# Patient Record
Sex: Female | Born: 1980 | Race: Black or African American | Hispanic: No | Marital: Single | Smoking: Never smoker
Health system: Southern US, Community
[De-identification: ages and names within clinical notes are randomized; demographics above are authoritative.]

## PROBLEM LIST (undated history)

## (undated) ENCOUNTER — Ambulatory Visit (HOSPITAL_COMMUNITY)
Disposition: A | Discharge status: Home / Self Care | Payer: Medicaid Other | Source: Ambulatory Visit | Attending: Psychiatry | Admitting: Psychiatry

## (undated) DIAGNOSIS — E669 Obesity, unspecified: Secondary | ICD-10-CM

## (undated) DIAGNOSIS — I10 Essential (primary) hypertension: Secondary | ICD-10-CM

## (undated) DIAGNOSIS — E0590 Thyrotoxicosis, unspecified without thyrotoxic crisis or storm: Secondary | ICD-10-CM

## (undated) DIAGNOSIS — T50901A Poisoning by unspecified drugs, medicaments and biological substances, accidental (unintentional), initial encounter: Secondary | ICD-10-CM

## (undated) DIAGNOSIS — E119 Type 2 diabetes mellitus without complications: Secondary | ICD-10-CM

## (undated) DIAGNOSIS — E079 Disorder of thyroid, unspecified: Secondary | ICD-10-CM

## (undated) DIAGNOSIS — F5089 Other specified eating disorder: Secondary | ICD-10-CM

## (undated) DIAGNOSIS — E872 Acidosis, unspecified: Secondary | ICD-10-CM

## (undated) DIAGNOSIS — F5083 Pica in adults: Secondary | ICD-10-CM

## (undated) DIAGNOSIS — F79 Unspecified intellectual disabilities: Secondary | ICD-10-CM

## (undated) DIAGNOSIS — I890 Lymphedema, not elsewhere classified: Secondary | ICD-10-CM

## (undated) DIAGNOSIS — F4320 Adjustment disorder, unspecified: Secondary | ICD-10-CM

## (undated) DIAGNOSIS — F603 Borderline personality disorder: Secondary | ICD-10-CM

## (undated) HISTORY — PX: NO PAST SURGERIES: SHX2092

---

## 2001-03-17 ENCOUNTER — Inpatient Hospital Stay (HOSPITAL_COMMUNITY): Admission: EM | Admit: 2001-03-17 | Discharge: 2001-03-23 | Payer: Self-pay | Admitting: *Deleted

## 2009-08-05 ENCOUNTER — Inpatient Hospital Stay (HOSPITAL_COMMUNITY): Admission: EM | Admit: 2009-08-05 | Discharge: 2009-08-08 | Payer: Self-pay | Admitting: Emergency Medicine

## 2009-08-10 ENCOUNTER — Inpatient Hospital Stay (HOSPITAL_COMMUNITY): Admission: EM | Admit: 2009-08-10 | Discharge: 2009-08-12 | Payer: Self-pay | Admitting: Emergency Medicine

## 2009-08-11 ENCOUNTER — Ambulatory Visit: Payer: Self-pay | Admitting: Psychiatry

## 2010-10-12 LAB — CARDIAC PANEL(CRET KIN+CKTOT+MB+TROPI)
Relative Index: 0.4 (ref 0.0–2.5)
Troponin I: 0.01 ng/mL (ref 0.00–0.06)

## 2010-10-12 LAB — CBC
HCT: 37.7 % (ref 36.0–46.0)
HCT: 40.4 % (ref 36.0–46.0)
Hemoglobin: 11.3 g/dL — ABNORMAL LOW (ref 12.0–15.0)
Hemoglobin: 13.5 g/dL (ref 12.0–15.0)
Hemoglobin: 13.2 g/dL (ref 12.0–15.0)
MCHC: 33.6 g/dL (ref 30.0–36.0)
MCHC: 33.7 g/dL (ref 30.0–36.0)
MCV: 87.5 fL (ref 78.0–100.0)
MCV: 88.2 fL (ref 78.0–100.0)
MCV: 87.3 fL (ref 78.0–100.0)
Platelets: 291 10*3/uL (ref 150–400)
Platelets: 319 10*3/uL (ref 150–400)
RBC: 3.8 MIL/uL — ABNORMAL LOW (ref 3.87–5.11)
RBC: 4.31 MIL/uL (ref 3.87–5.11)
RBC: 4.67 MIL/uL (ref 3.87–5.11)
RDW: 13.6 % (ref 11.5–15.5)
RDW: 13.2 % (ref 11.5–15.5)
WBC: 5 10*3/uL (ref 4.0–10.5)

## 2010-10-12 LAB — BASIC METABOLIC PANEL
BUN: 4 mg/dL — ABNORMAL LOW (ref 6–23)
BUN: 11 mg/dL (ref 6–23)
CO2: 21 mEq/L (ref 19–32)
CO2: 23 mEq/L (ref 19–32)
Chloride: 105 mEq/L (ref 96–112)
Chloride: 104 mEq/L (ref 96–112)
GFR calc non Af Amer: >60 mL/min (ref >60)
Glucose, Bld: 107 mg/dL — ABNORMAL HIGH (ref 70–99)
Glucose, Bld: 114 mg/dL — ABNORMAL HIGH (ref 70–99)
Potassium: 4 mEq/L (ref 3.5–5.1)
Potassium: 4 mEq/L (ref 3.5–5.1)
Potassium: 4.8 mEq/L (ref 3.5–5.1)
Sodium: 140 mEq/L (ref 135–145)

## 2010-10-12 LAB — URINALYSIS, ROUTINE W REFLEX MICROSCOPIC
Glucose, UA: NEGATIVE mg/dL
Hgb urine dipstick: NEGATIVE
Ketones, ur: NEGATIVE mg/dL
pH: 5.5 (ref 5.0–8.0)

## 2010-10-12 LAB — HEMOGLOBIN A1C
Hgb A1c MFr Bld: 5.8 % (ref 4.6–6.1)
Hgb A1c MFr Bld: 5.8 % (ref 4.6–6.1)
Mean Plasma Glucose: 120 mg/dL

## 2010-10-12 LAB — COMPREHENSIVE METABOLIC PANEL
ALT: 40 U/L — ABNORMAL HIGH (ref 0–35)
AST: 46 U/L — ABNORMAL HIGH (ref 0–37)
Albumin: 3.9 g/dL (ref 3.5–5.2)
Alkaline Phosphatase: 59 U/L (ref 39–117)
Glucose, Bld: 138 mg/dL — ABNORMAL HIGH (ref 70–99)
Potassium: 4.3 mEq/L (ref 3.5–5.1)
Sodium: 136 mEq/L (ref 135–145)
Total Protein: 7.8 g/dL (ref 6.0–8.3)

## 2010-10-12 LAB — GLUCOSE, CAPILLARY
Glucose-Capillary: 133 mg/dL — ABNORMAL HIGH (ref 70–99)
Glucose-Capillary: 66 mg/dL — ABNORMAL LOW (ref 70–99)
Glucose-Capillary: 88 mg/dL (ref 70–99)
Glucose-Capillary: 91 mg/dL (ref 70–99)
Glucose-Capillary: 93 mg/dL (ref 70–99)
Glucose-Capillary: 96 mg/dL (ref 70–99)
Glucose-Capillary: 98 mg/dL (ref 70–99)
Glucose-Capillary: 100 mg/dL — ABNORMAL HIGH (ref 70–99)
Glucose-Capillary: 102 mg/dL — ABNORMAL HIGH (ref 70–99)
Glucose-Capillary: 115 mg/dL — ABNORMAL HIGH (ref 70–99)

## 2010-10-12 LAB — RAPID URINE DRUG SCREEN, HOSP PERFORMED
Barbiturates: NOT DETECTED
Opiates: NOT DETECTED

## 2010-10-12 LAB — CK TOTAL AND CKMB (NOT AT ARMC)
CK, MB: 1.2 ng/mL (ref 0.3–4.0)
Relative Index: 0.6 (ref 0.0–2.5)
Total CK: 204 U/L — ABNORMAL HIGH (ref 7–177)

## 2010-10-12 LAB — DIFFERENTIAL
Basophils Absolute: 0 10*3/uL (ref 0.0–0.1)
Basophils Absolute: 0 10*3/uL (ref 0.0–0.1)
Basophils Relative: 0 % (ref 0–1)
Eosinophils Absolute: 0.1 10*3/uL (ref 0.0–0.7)
Eosinophils Absolute: 0.1 10*3/uL (ref 0.0–0.7)
Eosinophils Relative: 1 % (ref 0–5)
Eosinophils Relative: 1 % (ref 0–5)
Metamyelocytes Relative: 0 %
Monocytes Absolute: 0.4 10*3/uL (ref 0.1–1.0)
Myelocytes: 0 %

## 2010-10-12 LAB — ETHANOL: Alcohol, Ethyl (B): <5 mg/dL (ref 0–10)

## 2010-10-12 LAB — POCT CARDIAC MARKERS: CKMB, poc: 1.4 ng/mL (ref 1.0–8.0)

## 2010-10-13 LAB — CBC
Hemoglobin: 11.1 g/dL — ABNORMAL LOW (ref 12.0–15.0)
MCHC: 32.5 g/dL (ref 30.0–36.0)
MCHC: 33 g/dL (ref 30.0–36.0)
MCV: 86.9 fL (ref 78.0–100.0)
Platelets: 295 10*3/uL (ref 150–400)
RBC: 3.91 MIL/uL (ref 3.87–5.11)
RBC: 4.33 MIL/uL (ref 3.87–5.11)

## 2010-10-13 LAB — GLUCOSE, CAPILLARY
Glucose-Capillary: 110 mg/dL — ABNORMAL HIGH (ref 70–99)
Glucose-Capillary: 113 mg/dL — ABNORMAL HIGH (ref 70–99)
Glucose-Capillary: 76 mg/dL (ref 70–99)
Glucose-Capillary: 96 mg/dL (ref 70–99)

## 2010-10-13 LAB — BASIC METABOLIC PANEL
BUN: 10 mg/dL (ref 6–23)
CO2: 21 mEq/L (ref 19–32)
CO2: 25 mEq/L (ref 19–32)
Calcium: 8.6 mg/dL (ref 8.4–10.5)
Calcium: 8.9 mg/dL (ref 8.4–10.5)
Chloride: 108 mEq/L (ref 96–112)
Chloride: 109 mEq/L (ref 96–112)
Creatinine, Ser: 0.73 mg/dL (ref 0.4–1.2)
GFR calc Af Amer: >60 mL/min (ref >60)
GFR calc Af Amer: >60 mL/min (ref >60)
Potassium: 4 mEq/L (ref 3.5–5.1)
Sodium: 137 mEq/L (ref 135–145)

## 2010-10-13 LAB — LIPID PANEL
Cholesterol: 169 mg/dL (ref 0–200)
HDL: 62 mg/dL (ref >39)

## 2010-10-13 LAB — TSH: TSH: 1.648 u[IU]/mL (ref 0.350–4.500)

## 2010-10-13 LAB — URINALYSIS, ROUTINE W REFLEX MICROSCOPIC
Bilirubin Urine: NEGATIVE
Glucose, UA: NEGATIVE mg/dL
Hgb urine dipstick: NEGATIVE
Ketones, ur: NEGATIVE mg/dL
Protein, ur: NEGATIVE mg/dL
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
## 2010-12-12 NOTE — H&P (Signed)
[facility]  Patient:    Megan Manning, Megan Manning Visit Number: [id number] MRN: [id number]          Service Type: PSY Location: 30 0300 01 Attending Physician:  Denny Peon Dictated by:   Young Berry Scott, R.N. N.P. Admit Date:  03/17/2001                           History and Physical  HISTORY OF PRESENT ILLNESS:  This is an obese, black female, who uses the [facility] for her health care.  She primarily goes there for OB/GYN reasons and gets her Depo-Provera shot there quarterly.  The patient has no somatic complaints.  REVIEW OF SYSTEMS:  Review of systems is essentially negative.  She is a poor historian due to her mental retardation and we have a limited history available to her.  She is obviously breathing through her mouth and her nasal passages are completely congested.  She denies any feelings of ear pressure. She denies any cough or wheeze.  She states that her nose has been stuffed up "for a long time."  On admission, her WBC was 5.6 and she is afebrile.  The patient reports that she has had a Pap smear within the past year.  She de n any history of urinary tract infections.  She denies that she is having sexual intercourse, but is active sexually.  However, her case worker reports that she is promiscuous.  She denies any symptoms of vaginal itching or vaginal discharge, and she generally is healthy in appearance.  PHYSICAL EXAMINATION:  GENERAL APPEARANCE:  This is a obese, young, black female, who appears to be her stated age of 20 years.  Her hygiene is poor.  She is wearing dirty clothing and there is some body odor present.  She is alert and cooperative with the examination.  VITAL SIGNS:  Vital signs today were already reported.  The patient weighs 248 pounds and she is 5 feet 6 inches tall.  Vital signs are all within normal limits.  SKIN:  Dark in tone with roughness of the follicles on her legs and  thighs. No distinguishing marks.  Hair distribution is normal for age and sex.  HEENT:  Head is normocephalic without lesions.  No tenderness over frontal or maxillary sinuses.  ENT:  PERRLA.  No vessel changes seen on fundi bilaterally.  External ear canals are partially occluded by soft cerumen. Right reflex appears normal but is barely visible.  Hearing is intact to whisper to voice.  Nasal examination with otoscope reveals markedly swollen turbinates bilaterally.  Septum is midline.  There is a clear to whitish rhinorrhea on both and neither canal is patent.  No obvious polyps visualized. Nasal mucosa is also slightly injected.  Oropharynx is not injected.  Teeth are in good condition.  No breath odor.  Tongue is midline with facilitations.  NECK:  Supple with no thyromegaly.  AC and PC nodes are negative.  CHEST:  Symmetrical.  BREASTS:  Examination deferred.  CARDIOVASCULAR:  S1 and S2 is heard.  Regular rate and rhythm.  No clicks, murmurs, or gallops.  LUNGS:  Clear to auscultation.  ABDOMEN:  Rounded and with active bowel sounds.  Striae are apparently on the skin.  No masses or tenderness.  NEUROLOGICAL:  Cranial nerves II-XII are intact.  Extraocular movements are intact without nystagmus.  Deep tendon reflexes are 1+ and sluggish out of 5 but are bilaterally  symmetrical.  Romberg without findings.  Babinski is down. The patient has no tremor noted.  Motor movements are smooth.  No extensive tremor.  Sensory is grossly intact.  Musculoskeletal spine is straight, Posture is upright, gait is normal given her obese weight. Dictated by:   Young Berry Scott, R.N. N.P. Attending Physician:  Denny Peon DD:  03/18/01 TD:  03/22/01 Job: [id number]

## 2012-10-30 ENCOUNTER — Emergency Department: Payer: Self-pay | Admitting: Emergency Medicine

## 2016-02-10 DIAGNOSIS — R933 Abnormal findings on diagnostic imaging of other parts of digestive tract: Secondary | ICD-10-CM | POA: Insufficient documentation

## 2016-02-10 DIAGNOSIS — Z87821 Personal history of retained foreign body fully removed: Secondary | ICD-10-CM | POA: Insufficient documentation

## 2016-02-10 DIAGNOSIS — R1310 Dysphagia, unspecified: Secondary | ICD-10-CM | POA: Insufficient documentation

## 2016-02-10 DIAGNOSIS — E119 Type 2 diabetes mellitus without complications: Secondary | ICD-10-CM | POA: Insufficient documentation

## 2016-02-11 DIAGNOSIS — F79 Unspecified intellectual disabilities: Secondary | ICD-10-CM | POA: Insufficient documentation

## 2016-02-11 DIAGNOSIS — Z62810 Personal history of physical and sexual abuse in childhood: Secondary | ICD-10-CM | POA: Insufficient documentation

## 2016-02-11 DIAGNOSIS — F603 Borderline personality disorder: Secondary | ICD-10-CM | POA: Insufficient documentation

## 2016-07-24 ENCOUNTER — Emergency Department
Admission: EM | Admit: 2016-07-24 | Discharge: 2016-07-24 | Disposition: A | Discharge status: Home / Self Care | Payer: Medicaid Other | Attending: Emergency Medicine | Admitting: Emergency Medicine

## 2016-07-24 ENCOUNTER — Emergency Department: Payer: Medicaid Other

## 2016-07-24 DIAGNOSIS — R1084 Generalized abdominal pain: Secondary | ICD-10-CM

## 2016-07-24 DIAGNOSIS — R112 Nausea with vomiting, unspecified: Secondary | ICD-10-CM

## 2016-07-24 DIAGNOSIS — E119 Type 2 diabetes mellitus without complications: Secondary | ICD-10-CM | POA: Insufficient documentation

## 2016-07-24 DIAGNOSIS — K5900 Constipation, unspecified: Secondary | ICD-10-CM | POA: Insufficient documentation

## 2016-07-24 DIAGNOSIS — I10 Essential (primary) hypertension: Secondary | ICD-10-CM | POA: Diagnosis not present

## 2016-07-24 HISTORY — DX: Disorder of thyroid, unspecified: E07.9

## 2016-07-24 HISTORY — DX: Essential (primary) hypertension: I10

## 2016-07-24 HISTORY — DX: Type 2 diabetes mellitus without complications: E11.9

## 2016-07-24 LAB — URINALYSIS, COMPLETE (UACMP) WITH MICROSCOPIC
BACTERIA UA: NONE SEEN
BILIRUBIN URINE: NEGATIVE
Glucose, UA: NEGATIVE mg/dL
HGB URINE DIPSTICK: NEGATIVE
Ketones, ur: NEGATIVE mg/dL
Leukocytes, UA: NEGATIVE
NITRITE: NEGATIVE
PROTEIN: NEGATIVE mg/dL
SPECIFIC GRAVITY, URINE: 1.023 (ref 1.005–1.030)
pH: 5 (ref 5.0–8.0)

## 2016-07-24 LAB — COMPREHENSIVE METABOLIC PANEL
ALT: 20 U/L (ref 14–54)
AST: 20 U/L (ref 15–41)
Albumin: 4.6 g/dL (ref 3.5–5.0)
Alkaline Phosphatase: 66 U/L (ref 38–126)
Anion gap: 8 (ref 5–15)
BUN: 19 mg/dL (ref 6–20)
CO2: 23 mmol/L (ref 22–32)
Calcium: 9.7 mg/dL (ref 8.9–10.3)
Chloride: 108 mmol/L (ref 101–111)
Creatinine, Ser: 0.67 mg/dL (ref 0.44–1.00)
GFR calc Af Amer: >60 mL/min (ref >60)
GFR calc non Af Amer: >60 mL/min (ref >60)
Glucose, Bld: 93 mg/dL (ref 65–99)
Potassium: 4.4 mmol/L (ref 3.5–5.1)
Sodium: 139 mmol/L (ref 135–145)
Total Bilirubin: 0.3 mg/dL (ref 0.3–1.2)
Total Protein: 8 g/dL (ref 6.5–8.1)

## 2016-07-24 LAB — CBC
HCT: 39 % (ref 35.0–47.0)
Hemoglobin: 13.2 g/dL (ref 12.0–16.0)
MCH: 28.9 pg (ref 26.0–34.0)
MCHC: 33.7 g/dL (ref 32.0–36.0)
MCV: 85.8 fL (ref 80.0–100.0)
Platelets: 273 10*3/uL (ref 150–440)
RBC: 4.55 MIL/uL (ref 3.80–5.20)
RDW: 13.5 % (ref 11.5–14.5)
WBC: 3.8 10*3/uL (ref 3.6–11.0)

## 2016-07-24 LAB — LIPASE, BLOOD: LIPASE: 39 U/L (ref 11–51)

## 2016-07-24 LAB — POCT PREGNANCY, URINE: Preg Test, Ur: NEGATIVE

## 2016-07-24 MED ORDER — POLYETHYLENE GLYCOL 3350 17 G PO PACK: 17.0000 g | PACK | Freq: Every day | ORAL | 0 refills | Status: DC

## 2016-07-24 MED ORDER — ONDANSETRON 4 MG PO TBDP
4.0000 mg | ORAL_TABLET | Freq: Once | ORAL | Status: AC
  Administered 2016-07-24: 4 mg via ORAL
  Filled 2016-07-24: qty 1

## 2016-07-24 MED ORDER — ONDANSETRON 4 MG PO TBDP: 4.0000 mg | ORAL_TABLET | Freq: Three times a day (TID) | ORAL | 0 refills | Status: DC | PRN

## 2016-07-24 NOTE — ED Triage Notes (Signed)
Pt reports generalized abdominal pain since last night. Reports vomited x2 last night. Reports diarrhea last Friday but has not had a BM since. VS stable.

## 2016-07-24 NOTE — ED Provider Notes (Signed)
[facility] Emergency Department Provider Note        Time seen: ----------------------------------------- 3:41 PM on 07/24/2016 -----------------------------------------    I have reviewed the triage vital signs and the nursing notes.   HISTORY  Chief Complaint Abdominal Pain    HPI Megan Manning is a 35 y.o. female who presents to ER for generalized abdominal pain since last night. Patient reports she vomited twice last night, reports diarrhea 6 days ago but she has not had a bowel movement since.Patient reports she typically uses a rectal suppository to have a bowel movement. She denies fevers, chills, chest pain, shortness of breath or other complaints. Patient thinks she may be constipated at this time.   Past Medical History:  Diagnosis Date  . Diabetes mellitus without complication (HCC)   . Hypertension   . Thyroid disease     There are no active problems to display for this patient.   History reviewed. No pertinent surgical history.  Allergies Patient has no known allergies.  Social History Social History  Substance Use Topics  . Smoking status: Never Smoker  . Smokeless tobacco: Never Used  . Alcohol use No    Review of Systems Constitutional: Negative for fever. Cardiovascular: Negative for chest pain. Respiratory: Negative for shortness of breath. Gastrointestinal: Positive for abdominal pain, vomiting Genitourinary: Negative for dysuria. Musculoskeletal: Negative for back pain. Skin: Negative for rash. Neurological: Negative for headaches, focal weakness or numbness.  10-point ROS otherwise negative.  ____________________________________________   PHYSICAL EXAM:  VITAL SIGNS: ED Triage Vitals [07/24/16 1232]  Enc Vitals Group     BP 131/78     Pulse Rate 81     Resp 16     Temp 99.1 F (37.3 C)     Temp Source Oral     SpO2 96 %     Weight 218 lb (98.9 kg)     Height 5\' 3"  (1.6 m)     Head Circumference       Peak Flow      Pain Score      Pain Loc      Pain Edu?      Excl. in GC?     Constitutional: Alert and oriented. Well appearing and in no distress. Eyes: Conjunctivae are normal. PERRL. Normal extraocular movements. ENT   Head: Normocephalic and atraumatic.   Nose: No congestion/rhinnorhea.   Mouth/Throat: Mucous membranes are moist.   Neck: No stridor. Cardiovascular: Normal rate, regular rhythm. No murmurs, rubs, or gallops. Respiratory: Normal respiratory effort without tachypnea nor retractions. Breath sounds are clear and equal bilaterally. No wheezes/rales/rhonchi. Gastrointestinal: Soft and nontender. Normal bowel sounds Musculoskeletal: Nontender with normal range of motion in all extremities. No lower extremity tenderness nor edema. Neurologic:  Normal speech and language. No gross focal neurologic deficits are appreciated.  Skin:  Skin is warm, dry and intact. No rash noted. Psychiatric: Mood and affect are normal. Speech and behavior are normal.  ____________________________________________  ED COURSE:  Pertinent labs & imaging results that were available during my care of the patient were reviewed by me and considered in my medical decision making (see chart for details). Clinical Course   Patient is no distress, we will assess with labs and imaging  Procedures ____________________________________________   LABS (pertinent positives/negatives)  Labs Reviewed  URINALYSIS, COMPLETE (UACMP) WITH MICROSCOPIC - Abnormal; Notable for the following:       Result Value   Color, Urine YELLOW (*)    APPearance CLEAR (*)  Squamous Epithelial / LPF 0-5 (*)    All other components within normal limits  LIPASE, BLOOD  COMPREHENSIVE METABOLIC PANEL  CBC  POC URINE PREG, ED  POCT PREGNANCY, URINE    RADIOLOGY Images were viewed by me  Abdomen 2 view Did not reveal bowel obstruction or ileus ____________________________________________  FINAL  ASSESSMENT AND PLAN  Abdominal pain, vomiting  Plan: Patient with labs and imaging as dictated above. Patient is in no acute distress, likely viral in etiology. She does have some chronic constipation. She'll be discharged with Zofran and MiraLAX. She stable for outpatient follow-up.   Emily FilbertWilliams, Zoe Goonan E, MD   Note: This dictation was prepared with Dragon dictation. Any transcriptional errors that result from this process are unintentional    Emily FilbertJonathan E Dakarri Kessinger, MD 07/24/16 [phone]

## 2016-09-28 ENCOUNTER — Emergency Department: Payer: Medicaid Other

## 2016-09-28 ENCOUNTER — Observation Stay
Admission: EM | Admit: 2016-09-28 | Discharge: 2016-09-30 | Disposition: A | Discharge status: Home / Self Care | Payer: Medicaid Other | Attending: Internal Medicine | Admitting: Internal Medicine

## 2016-09-28 ENCOUNTER — Encounter: Payer: Self-pay | Admitting: Emergency Medicine

## 2016-09-28 DIAGNOSIS — J9819 Other pulmonary collapse: Secondary | ICD-10-CM | POA: Diagnosis not present

## 2016-09-28 DIAGNOSIS — R600 Localized edema: Secondary | ICD-10-CM | POA: Diagnosis not present

## 2016-09-28 DIAGNOSIS — E039 Hypothyroidism, unspecified: Secondary | ICD-10-CM | POA: Insufficient documentation

## 2016-09-28 DIAGNOSIS — R531 Weakness: Secondary | ICD-10-CM | POA: Diagnosis not present

## 2016-09-28 DIAGNOSIS — F840 Autistic disorder: Secondary | ICD-10-CM | POA: Insufficient documentation

## 2016-09-28 DIAGNOSIS — F39 Unspecified mood [affective] disorder: Secondary | ICD-10-CM | POA: Diagnosis not present

## 2016-09-28 DIAGNOSIS — R0781 Pleurodynia: Secondary | ICD-10-CM

## 2016-09-28 DIAGNOSIS — G459 Transient cerebral ischemic attack, unspecified: Secondary | ICD-10-CM | POA: Diagnosis present

## 2016-09-28 DIAGNOSIS — Z79899 Other long term (current) drug therapy: Secondary | ICD-10-CM | POA: Diagnosis not present

## 2016-09-28 DIAGNOSIS — J90 Pleural effusion, not elsewhere classified: Secondary | ICD-10-CM | POA: Diagnosis not present

## 2016-09-28 DIAGNOSIS — I351 Nonrheumatic aortic (valve) insufficiency: Secondary | ICD-10-CM | POA: Diagnosis not present

## 2016-09-28 DIAGNOSIS — R071 Chest pain on breathing: Secondary | ICD-10-CM | POA: Insufficient documentation

## 2016-09-28 DIAGNOSIS — I119 Hypertensive heart disease without heart failure: Secondary | ICD-10-CM | POA: Diagnosis not present

## 2016-09-28 DIAGNOSIS — R29810 Facial weakness: Secondary | ICD-10-CM

## 2016-09-28 DIAGNOSIS — J9811 Atelectasis: Secondary | ICD-10-CM | POA: Diagnosis present

## 2016-09-28 DIAGNOSIS — E119 Type 2 diabetes mellitus without complications: Secondary | ICD-10-CM | POA: Diagnosis not present

## 2016-09-28 DIAGNOSIS — Z6838 Body mass index (BMI) 38.0-38.9, adult: Secondary | ICD-10-CM | POA: Insufficient documentation

## 2016-09-28 LAB — BASIC METABOLIC PANEL
Anion gap: 5 (ref 5–15)
BUN: 12 mg/dL (ref 6–20)
CALCIUM: 8.5 mg/dL — AB (ref 8.9–10.3)
CO2: 24 mmol/L (ref 22–32)
CREATININE: 0.61 mg/dL (ref 0.44–1.00)
Chloride: 114 mmol/L — ABNORMAL HIGH (ref 101–111)
GFR calc Af Amer: >60 mL/min (ref >60)
GLUCOSE: 108 mg/dL — AB (ref 65–99)
Potassium: 3.6 mmol/L (ref 3.5–5.1)
Sodium: 143 mmol/L (ref 135–145)

## 2016-09-28 LAB — CBC WITH DIFFERENTIAL/PLATELET
Basophils Absolute: 0 10*3/uL (ref 0–0.1)
Basophils Relative: 1 %
Eosinophils Absolute: 0 10*3/uL (ref 0–0.7)
Eosinophils Relative: 1 %
HCT: 33.2 % — ABNORMAL LOW (ref 35.0–47.0)
Hemoglobin: 11.1 g/dL — ABNORMAL LOW (ref 12.0–16.0)
Lymphocytes Relative: 45 %
Lymphs Abs: 2.4 10*3/uL (ref 1.0–3.6)
MCH: 28.1 pg (ref 26.0–34.0)
MCHC: 33.4 g/dL (ref 32.0–36.0)
MCV: 84.2 fL (ref 80.0–100.0)
Monocytes Absolute: 0.3 10*3/uL (ref 0.2–0.9)
Monocytes Relative: 7 %
Neutro Abs: 2.5 10*3/uL (ref 1.4–6.5)
Neutrophils Relative %: 46 %
Platelets: 240 10*3/uL (ref 150–440)
RBC: 3.94 MIL/uL (ref 3.80–5.20)
RDW: 14.2 % (ref 11.5–14.5)
WBC: 5.3 10*3/uL (ref 3.6–11.0)

## 2016-09-28 LAB — CBC
HEMATOCRIT: 34.6 % — AB (ref 35.0–47.0)
Hemoglobin: 11.6 g/dL — ABNORMAL LOW (ref 12.0–16.0)
MCH: 28.4 pg (ref 26.0–34.0)
MCHC: 33.4 g/dL (ref 32.0–36.0)
MCV: 85 fL (ref 80.0–100.0)
Platelets: 265 10*3/uL (ref 150–440)
RBC: 4.07 MIL/uL (ref 3.80–5.20)
RDW: 14.4 % (ref 11.5–14.5)
WBC: 4.7 10*3/uL (ref 3.6–11.0)

## 2016-09-28 LAB — GLUCOSE, CAPILLARY
GLUCOSE-CAPILLARY: 94 mg/dL (ref 65–99)
Glucose-Capillary: 117 mg/dL — ABNORMAL HIGH (ref 65–99)

## 2016-09-28 LAB — BRAIN NATRIURETIC PEPTIDE: B Natriuretic Peptide: 8 pg/mL (ref 0.0–100.0)

## 2016-09-28 LAB — TROPONIN I: Troponin I: <0.03 ng/mL (ref <0.03)

## 2016-09-28 LAB — HCG, QUANTITATIVE, PREGNANCY

## 2016-09-28 MED ORDER — ONDANSETRON HCL 4 MG/2ML IJ SOLN: 4.0000 mg | Freq: Four times a day (QID) | INTRAMUSCULAR | Status: DC | PRN

## 2016-09-28 MED ORDER — INSULIN ASPART 100 UNIT/ML ~~LOC~~ SOLN
0.0000 [IU] | Freq: Three times a day (TID) | SUBCUTANEOUS | Status: DC
  Administered 2016-09-30: 2 [IU] via SUBCUTANEOUS
  Filled 2016-09-28: qty 2

## 2016-09-28 MED ORDER — POLYETHYLENE GLYCOL 3350 17 G PO PACK: 17.0000 g | PACK | Freq: Every day | ORAL | Status: DC | PRN

## 2016-09-28 MED ORDER — METOPROLOL SUCCINATE ER 25 MG PO TB24
25.0000 mg | ORAL_TABLET | Freq: Every day | ORAL | Status: DC
  Administered 2016-09-29 – 2016-09-30 (×2): 25 mg via ORAL
  Filled 2016-09-28 (×3): qty 1

## 2016-09-28 MED ORDER — SODIUM CHLORIDE 0.9% FLUSH
3.0000 mL | Freq: Two times a day (BID) | INTRAVENOUS | Status: DC
  Administered 2016-09-28 – 2016-09-30 (×4): 3 mL via INTRAVENOUS

## 2016-09-28 MED ORDER — LISINOPRIL 10 MG PO TABS
10.0000 mg | ORAL_TABLET | Freq: Every day | ORAL | Status: DC
  Administered 2016-09-28 – 2016-09-30 (×3): 10 mg via ORAL
  Filled 2016-09-28 (×2): qty 1

## 2016-09-28 MED ORDER — FAMOTIDINE 20 MG PO TABS
20.0000 mg | ORAL_TABLET | Freq: Every day | ORAL | Status: DC
  Administered 2016-09-29 – 2016-09-30 (×2): 20 mg via ORAL
  Filled 2016-09-28 (×2): qty 1

## 2016-09-28 MED ORDER — CLOZAPINE 100 MG PO TABS
150.0000 mg | ORAL_TABLET | Freq: Every day | ORAL | Status: DC
  Administered 2016-09-29 – 2016-09-30 (×3): 150 mg via ORAL
  Filled 2016-09-28 (×3): qty 2

## 2016-09-28 MED ORDER — ALBUTEROL SULFATE (2.5 MG/3ML) 0.083% IN NEBU: 2.5000 mg | INHALATION_SOLUTION | RESPIRATORY_TRACT | Status: DC | PRN

## 2016-09-28 MED ORDER — BISACODYL 10 MG RE SUPP: 10.0000 mg | Freq: Every day | RECTAL | Status: DC | PRN

## 2016-09-28 MED ORDER — LISINOPRIL 10 MG PO TABS
ORAL_TABLET | ORAL | Status: AC
  Administered 2016-09-28: 10 mg via ORAL
  Filled 2016-09-28: qty 1

## 2016-09-28 MED ORDER — IBUPROFEN 200 MG PO TABS
400.0000 mg | ORAL_TABLET | Freq: Four times a day (QID) | ORAL | Status: DC | PRN
  Administered 2016-09-28 – 2016-09-29 (×2): 400 mg via ORAL
  Filled 2016-09-28 (×2): qty 2

## 2016-09-28 MED ORDER — KETOROLAC TROMETHAMINE 15 MG/ML IJ SOLN
15.0000 mg | Freq: Four times a day (QID) | INTRAMUSCULAR | Status: DC | PRN
  Administered 2016-09-28 – 2016-09-29 (×2): 15 mg via INTRAVENOUS
  Filled 2016-09-28 (×2): qty 1

## 2016-09-28 MED ORDER — ACETAMINOPHEN 325 MG PO TABS: 650.0000 mg | ORAL_TABLET | Freq: Four times a day (QID) | ORAL | Status: DC | PRN

## 2016-09-28 MED ORDER — IOPAMIDOL (ISOVUE-370) INJECTION 76%
75.0000 mL | Freq: Once | INTRAVENOUS | Status: AC | PRN
  Administered 2016-09-28: 75 mL via INTRAVENOUS

## 2016-09-28 MED ORDER — METFORMIN HCL 500 MG PO TABS
500.0000 mg | ORAL_TABLET | Freq: Every day | ORAL | Status: DC
  Administered 2016-09-29: 500 mg via ORAL
  Filled 2016-09-28: qty 1

## 2016-09-28 MED ORDER — HYDRALAZINE HCL 20 MG/ML IJ SOLN: 10.0000 mg | Freq: Four times a day (QID) | INTRAMUSCULAR | Status: DC | PRN

## 2016-09-28 MED ORDER — ASPIRIN 81 MG PO CHEW
324.0000 mg | CHEWABLE_TABLET | Freq: Once | ORAL | Status: AC
  Administered 2016-09-28: 324 mg via ORAL
  Filled 2016-09-28: qty 4

## 2016-09-28 MED ORDER — ONDANSETRON HCL 4 MG PO TABS: 4.0000 mg | ORAL_TABLET | Freq: Four times a day (QID) | ORAL | Status: DC | PRN

## 2016-09-28 MED ORDER — HALOPERIDOL 5 MG PO TABS
5.0000 mg | ORAL_TABLET | Freq: Every day | ORAL | Status: DC
  Administered 2016-09-29 – 2016-09-30 (×3): 5 mg via ORAL
  Filled 2016-09-28 (×3): qty 1

## 2016-09-28 MED ORDER — KETOROLAC TROMETHAMINE 30 MG/ML IJ SOLN
INTRAMUSCULAR | Status: AC
  Administered 2016-09-28: 15 mg
  Filled 2016-09-28: qty 1

## 2016-09-28 MED ORDER — POLYETHYLENE GLYCOL 3350 17 G PO PACK
17.0000 g | PACK | Freq: Every day | ORAL | Status: DC
  Administered 2016-09-29 – 2016-09-30 (×2): 17 g via ORAL
  Filled 2016-09-28 (×2): qty 1

## 2016-09-28 MED ORDER — ATORVASTATIN CALCIUM 20 MG PO TABS
40.0000 mg | ORAL_TABLET | Freq: Every day | ORAL | Status: DC
  Administered 2016-09-29: 40 mg via ORAL
  Filled 2016-09-28: qty 2

## 2016-09-28 MED ORDER — METOPROLOL SUCCINATE ER 50 MG PO TB24
ORAL_TABLET | ORAL | Status: AC
  Filled 2016-09-28: qty 1

## 2016-09-28 MED ORDER — ENOXAPARIN SODIUM 40 MG/0.4ML ~~LOC~~ SOLN
40.0000 mg | SUBCUTANEOUS | Status: DC
  Administered 2016-09-28 – 2016-09-29 (×2): 40 mg via SUBCUTANEOUS
  Filled 2016-09-28 (×2): qty 0.4

## 2016-09-28 MED ORDER — ACETAMINOPHEN 650 MG RE SUPP: 650.0000 mg | Freq: Four times a day (QID) | RECTAL | Status: DC | PRN

## 2016-09-28 MED ORDER — ASPIRIN EC 81 MG PO TBEC
81.0000 mg | DELAYED_RELEASE_TABLET | Freq: Every day | ORAL | Status: DC
  Administered 2016-09-29 – 2016-09-30 (×2): 81 mg via ORAL
  Filled 2016-09-28 (×2): qty 1

## 2016-09-28 MED ORDER — LEVOTHYROXINE SODIUM 88 MCG PO TABS
88.0000 ug | ORAL_TABLET | Freq: Every day | ORAL | Status: DC
  Administered 2016-09-29: 88 ug via ORAL
  Filled 2016-09-28 (×2): qty 1

## 2016-09-28 NOTE — ED Notes (Signed)
Patient transported to CT 

## 2016-09-28 NOTE — ED Provider Notes (Signed)
[facility] Emergency Department Provider Note   ____________________________________________   First MD Initiated Contact with Patient 09/28/16 1551     (approximate)  I have reviewed the triage vital signs and the nursing notes.   HISTORY  Chief Complaint Chest Pain and Weakness  EM caveat: Somewhat limited due to patient autism and cognition.  HPI Megan Manning is a 36 y.o. female here for medical evaluation. The exact reason for evaluation is slightly unclear.  The patient reports she is here for evaluation of chest pain. Evidently around 2 hours before arrival she began experiencing pain in her middle chest which she describes as sharp. She was in the car. Then the caretaker reports the patient was drooling, acting strangely and exhibited left arm and leg weakness and speech change, but the caregiver also reports that she is only care for her once before.   Past Medical History:  Diagnosis Date  . Diabetes mellitus without complication (HCC)   . Hypertension   . Thyroid disease     There are no active problems to display for this patient.   History reviewed. No pertinent surgical history.  Prior to Admission medications   Medication Sig Start Date End Date Taking? Authorizing Provider  clozapine (CLOZARIL) 50 MG tablet Take 150 mg by mouth daily.   Yes Historical Provider, MD  docusate sodium (COLACE) 100 MG capsule Take 100 mg by mouth 2 (two) times daily as needed for mild constipation.   Yes Historical Provider, MD  haloperidol (HALDOL) 5 MG tablet Take 5 mg by mouth daily.   Yes Historical Provider, MD  levothyroxine (SYNTHROID, LEVOTHROID) 88 MCG tablet Take 88 mcg by mouth daily.   Yes Historical Provider, MD  loratadine (CLARITIN) 10 MG tablet Take 10 mg by mouth daily.   Yes Historical Provider, MD  magnesium oxide (MAG-OX) 400 MG tablet Take 400 mg by mouth daily.   Yes Historical Provider, MD  metFORMIN (GLUCOPHAGE) 500 MG tablet  Take 500 mg by mouth daily.   Yes Historical Provider, MD  ondansetron (ZOFRAN ODT) 4 MG disintegrating tablet Take 1 tablet (4 mg total) by mouth every 8 (eight) hours as needed for nausea or vomiting. 07/24/16  Yes Emily Filbert, MD  polyethylene glycol (MIRALAX / GLYCOLAX) packet Take 17 g by mouth daily. 07/24/16  Yes Emily Filbert, MD  ranitidine (ZANTAC) 150 MG tablet Take 150 mg by mouth 2 (two) times daily.   Yes Historical Provider, MD    Allergies Patient has no known allergies.  History reviewed. No pertinent family history.  Social History Social History  Substance Use Topics  . Smoking status: Never Smoker  . Smokeless tobacco: Never Used  . Alcohol use No    Review of Systems Constitutional: No fever/chills Eyes: No visual changes. ENT: No sore throat. Cardiovascular: History of present illness Respiratory: Denies shortness of breath. Gastrointestinal: No abdominal pain.  No nausea, no vomiting.  No diarrhea.  No constipation. Genitourinary: Negative for dysuria. Musculoskeletal: Negative for back pain. Skin: Negative for rash. Neurological: Negative for headaches. See history of present illness  10-point ROS otherwise negative.  ____________________________________________   PHYSICAL EXAM:  VITAL SIGNS: ED Triage Vitals [09/28/16 1241]  Enc Vitals Group     BP (!) 162/93     Pulse Rate (!) 124     Resp 20     Temp 99.6 F (37.6 C)     Temp Source Oral     SpO2 98 %  Weight 219 lb (99.3 kg)     Height 5\' 3"  (1.6 m)     Head Circumference      Peak Flow      Pain Score 10     Pain Loc      Pain Edu?      Excl. in GC?     Constitutional: Alert and oriented. Well appearing and in no acute distress. Eyes: Conjunctivae are normal. PERRL. EOMI. Head: Atraumatic. Nose: No congestion/rhinnorhea. Mouth/Throat: Mucous membranes are moist.  Oropharynx non-erythematous. Neck: No stridor.   Cardiovascular: Mild tachycardic rate, regular  rhythm. Grossly normal heart sounds.  Good peripheral circulation. Respiratory: Normal respiratory effort.  No retractions. Lungs CTAB. Gastrointestinal: Soft and nontender. No distention. Musculoskeletal: No lower extremity tenderness but does have 2+ lower extremity edema.  No joint effusions. Neurologic:  Normal speech and language. No gross focal neurologic deficits are appreciated. Moves all extremities with normal strength now. She reports all first symptoms of weakness have resolved. No facial droop. Clear speech. Skin:  Skin is warm, dry and intact. No rash noted. Psychiatric: Mood and affect are normal. Speech and behavior are normal.  ____________________________________________   LABS (all labs ordered are listed, but only abnormal results are displayed)  Labs Reviewed  BASIC METABOLIC PANEL - Abnormal; Notable for the following:       Result Value   Chloride 114 (*)    Glucose, Bld 108 (*)    Calcium 8.5 (*)    All other components within normal limits  CBC - Abnormal; Notable for the following:    Hemoglobin 11.6 (*)    HCT 34.6 (*)    All other components within normal limits  GLUCOSE, CAPILLARY - Abnormal; Notable for the following:    Glucose-Capillary 117 (*)    All other components within normal limits  HCG, QUANTITATIVE, PREGNANCY  BRAIN NATRIURETIC PEPTIDE  URINALYSIS, COMPLETE (UACMP) WITH MICROSCOPIC  CBG MONITORING, ED   ____________________________________________  EKG  Reviewed and to room me at 12:30 Ventricular rate 120 QRS 70 QTc 440 Sinus tachycardia, nonspecific T-wave abnormality with possible left ventricular hypertrophy ____________________________________________  RADIOLOGY  Ct Head Wo Contrast  Result Date: 09/28/2016 CLINICAL DATA:  Left-sided weakness for more than 2 hours. EXAM: CT HEAD WITHOUT CONTRAST TECHNIQUE: Contiguous axial images were obtained from the base of the skull through the vertex without intravenous contrast.  COMPARISON:  08/07/2009 FINDINGS: Brain: No evidence for acute hemorrhage, mass lesion, midline shift, hydrocephalus or large infarct. Vascular: No hyperdense vessel or unexpected calcification. Skull: Normal. Negative for fracture or focal lesion. Sinuses/Orbits: Minimal disease in the visualized sphenoid sinuses. Other: None. IMPRESSION: Negative head CT. Electronically Signed   By: Richarda Overlie M.D.   On: 09/28/2016 13:08   Ct Angio Chest Pe W Or Wo Contrast  Result Date: 09/28/2016 CLINICAL DATA:  Left-sided chest pain.  Shortness of breath. EXAM: CT ANGIOGRAPHY CHEST WITH CONTRAST TECHNIQUE: Multidetector CT imaging of the chest was performed using the standard protocol during bolus administration of intravenous contrast. Multiplanar CT image reconstructions and MIPs were obtained to evaluate the vascular anatomy. CONTRAST:  75 mL Isovue 370 COMPARISON:  Two-view chest x-ray 08/05/2009 FINDINGS: Cardiovascular: The heart is mildly enlarged. There is no significant pericardial effusion. Pulmonary arterial opacification is satisfactory. There are no focal filling defects to suggest pulmonary embolus. The study is mildly degraded by breathing motion. The the aorta and great vessel origins are within normal limits. Mediastinum/Nodes: No significant mediastinal or axillary adenopathy is present. Lungs/Pleura:  Partial collapse of the right lower lobe with volume loss associated with elevation of the right hemidiaphragm no other focal nodule, mass, or airspace disease is present. There is significant pleural effusion. Upper Abdomen: The right hemidiaphragm is elevated. The liver is unremarkable. Limited imaging of the upper abdomen is otherwise unremarkable. Musculoskeletal: Vertebral body heights alignment are maintained. No focal lytic or blastic lesions are present. The ribs are intact. Review of the MIP images confirms the above findings. IMPRESSION: 1. No pulmonary embolus. 2. Volume loss and partial collapse in  the right lower lobe associated with elevation of the right hemidiaphragm. There is no central obstructing lesion or etiology of the elevated hemidiaphragm evident. Electronically Signed   By: Marin Robertshristopher  Mattern M.D.   On: 09/28/2016 17:41   [place] Venous Img Lower Bilateral  Result Date: 09/28/2016 CLINICAL DATA:  Bilateral lower extremity pain and edema EXAM: BILATERAL LOWER EXTREMITY VENOUS DOPPLER ULTRASOUND TECHNIQUE: Gray-scale sonography with graded compression, as well as color Doppler and duplex ultrasound were performed to evaluate the lower extremity deep venous systems from the level of the common femoral vein and including the common femoral, femoral, profunda femoral, popliteal and calf veins including the posterior tibial, peroneal and gastrocnemius veins when visible. The superficial great saphenous vein was also interrogated. Spectral Doppler was utilized to evaluate flow at rest and with distal augmentation maneuvers in the common femoral, femoral and popliteal veins. COMPARISON:  CTA chest 09/28/2016 FINDINGS: RIGHT LOWER EXTREMITY Common Femoral Vein: No evidence of thrombus. Normal compressibility, respiratory phasicity and response to augmentation. Saphenofemoral Junction: No evidence of thrombus. Normal compressibility and flow on color Doppler imaging. Profunda Femoral Vein: No evidence of thrombus. Normal compressibility and flow on color Doppler imaging. Femoral Vein: No evidence of thrombus. Normal compressibility, respiratory phasicity and response to augmentation. Popliteal Vein: No evidence of thrombus. Normal compressibility, respiratory phasicity and response to augmentation. Calf Veins: No evidence of thrombus. Normal compressibility and flow on color Doppler imaging. Venous Reflux:  None. Other Findings:  None. LEFT LOWER EXTREMITY Common Femoral Vein: No evidence of thrombus. Normal compressibility, respiratory phasicity and response to augmentation. Saphenofemoral Junction: No  evidence of thrombus. Normal compressibility and flow on color Doppler imaging. Profunda Femoral Vein: No evidence of thrombus. Normal compressibility and flow on color Doppler imaging. Femoral Vein: No evidence of thrombus. Normal compressibility, respiratory phasicity and response to augmentation. Popliteal Vein: No evidence of thrombus. Normal compressibility, respiratory phasicity and response to augmentation. Calf Veins: No evidence of thrombus. Normal compressibility and flow on color Doppler imaging. Venous Reflux:  None. Other Findings:  None. IMPRESSION: No evidence of deep venous thrombosis. Electronically Signed   By: Jasmine PangKim  Fujinaga M.D.   On: 09/28/2016 18:11    ____________________________________________   PROCEDURES  Procedure(s) performed: None  Procedures  Critical Care performed: No  ____________________________________________   INITIAL IMPRESSION / ASSESSMENT AND PLAN / ED COURSE  Pertinent labs & imaging results that were available during my care of the patient were reviewed by me and considered in my medical decision making (see chart for details).  1. Chest pain. Pleuritic in nature. No evidence of acute ischemia on EKG and her symptoms very atypical of coronary syndrome. Possible LVH on EKG, but no evidence of obvious ischemia. Pain is improved after aspirin. CT scan demonstrates right lower lobe collapse, unclear as to exact etiology.  2. Neurologic symptoms. Resolved now, saw by neurology who recommends evaluation and admission for a TIA workup.  ----------------------------------------- 6:58 PM on 09/28/2016 -----------------------------------------  Patient resting comfortably. No distress. Agreeable with plan for observation and further evaluation under the hospitalist service. Reports no pain or symptoms at this time. Alert and pleasant in no distress.      ____________________________________________   FINAL CLINICAL IMPRESSION(S) / ED  DIAGNOSES  Final diagnoses:  Pleuritic chest pain  Transient cerebral ischemia, unspecified type  Collapse of right lung      NEW MEDICATIONS STARTED DURING THIS VISIT:  New Prescriptions   No medications on file     Note:  This document was prepared using Dragon voice recognition software and may include unintentional dictation errors.     Sharyn Creamer, MD 09/28/16 [phone]

## 2016-09-28 NOTE — ED Triage Notes (Signed)
Pt to ED via EMS c/o left chest pain and left side weakness states more than 2 hours ago unknown exact LKW.  Pt states left mouth drooping, drooling, left arm and leg weakness, dysarthria.  Pt alert to person, disoriented to place and time.  Pt with equal grips bilaterally, symmetrical facial movement.

## 2016-09-28 NOTE — ED Notes (Signed)
Pt stuck multiple times by several staff members attempting to get blood work.  All attempts unsuccessful at this time.  Patient updated and in lobby.  First nurse notified.

## 2016-09-28 NOTE — ED Notes (Signed)
Report given to floor RN. Patient in stable condition prior. Patient care transferred pt moved to room 123 per md orders.

## 2016-09-28 NOTE — ED Notes (Signed)
SOC  CALLED  INFORMED  RN [employer]IE  RN

## 2016-09-28 NOTE — ED Notes (Signed)
Carney BernShanika Sides (sister) [phone]

## 2016-09-28 NOTE — ED Notes (Signed)
Difficult to assess neuro status r/t pt have autism and caregivers first day so unsure of baseline.

## 2016-09-28 NOTE — H&P (Signed)
[employer] - [place] at [facility]   PATIENT NAME: Megan Manning    MR#:  [id number]  DATE OF BIRTH:  1981-06-21  DATE OF ADMISSION:  09/28/2016  PRIMARY CARE PHYSICIAN: [facility]   REQUESTING/REFERRING PHYSICIAN: Dr. Fanny Bien  CHIEF COMPLAINT:   Chief Complaint  Patient presents with  . Chest Pain  . Weakness    HISTORY OF PRESENT ILLNESS:  Megan Manning  is a 36 y.o. female with a known history of Attention, diabetes, autism presents to the emergency room brought in by her caregiver due to left facial droop and left arm and leg weakness. Patient's facial droop has resolved and still continues to complain of mild left lower extremity weakness. She was seen by remote neurology specialist on call and advised admission for TIA workup. She also complains of some lower right chest pain and CT scan of the chest showed right lung lower lobe partial collapse without any obstruction or pneumonia.  PAST MEDICAL HISTORY:   Past Medical History:  Diagnosis Date  . Diabetes mellitus without complication (HCC)   . Hypertension   . Thyroid disease     PAST SURGICAL HISTORY:  History reviewed. No pertinent surgical history.  SOCIAL HISTORY:   Social History  Substance Use Topics  . Smoking status: Never Smoker  . Smokeless tobacco: Never Used  . Alcohol use No    FAMILY HISTORY:   Family History  Problem Relation Age of Onset  . CVA Neg Hx     DRUG ALLERGIES:  No Known Allergies  REVIEW OF SYSTEMS:   Review of Systems  Constitutional: Positive for malaise/fatigue. Negative for chills, fever and weight loss.  HENT: Negative for hearing loss and nosebleeds.   Eyes: Negative for blurred vision, double vision and pain.  Respiratory: Positive for cough. Negative for hemoptysis, sputum production, shortness of breath and wheezing.   Cardiovascular: Positive for chest pain. Negative for palpitations, orthopnea and leg swelling.  Gastrointestinal:  Negative for abdominal pain, constipation, diarrhea, nausea and vomiting.  Genitourinary: Negative for dysuria and hematuria.  Musculoskeletal: Negative for back pain, falls and myalgias.  Skin: Negative for rash.  Neurological: Positive for focal weakness and weakness. Negative for dizziness, tremors, sensory change, speech change, seizures and headaches.  Endo/Heme/Allergies: Does not bruise/bleed easily.  Psychiatric/Behavioral: Negative for depression and memory loss. The patient is not nervous/anxious.     MEDICATIONS AT HOME:   Prior to Admission medications   Medication Sig Start Date End Date Taking? Authorizing Provider  clozapine (CLOZARIL) 50 MG tablet Take 150 mg by mouth daily.   Yes Historical Provider, MD  docusate sodium (COLACE) 100 MG capsule Take 100 mg by mouth 2 (two) times daily as needed for mild constipation.   Yes Historical Provider, MD  haloperidol (HALDOL) 5 MG tablet Take 5 mg by mouth daily.   Yes Historical Provider, MD  levothyroxine (SYNTHROID, LEVOTHROID) 88 MCG tablet Take 88 mcg by mouth daily.   Yes Historical Provider, MD  loratadine (CLARITIN) 10 MG tablet Take 10 mg by mouth daily.   Yes Historical Provider, MD  magnesium oxide (MAG-OX) 400 MG tablet Take 400 mg by mouth daily.   Yes Historical Provider, MD  metFORMIN (GLUCOPHAGE) 500 MG tablet Take 500 mg by mouth daily.   Yes Historical Provider, MD  ondansetron (ZOFRAN ODT) 4 MG disintegrating tablet Take 1 tablet (4 mg total) by mouth every 8 (eight) hours as needed for nausea or vomiting. 07/24/16  Yes Emily Filbert,  MD  polyethylene glycol (MIRALAX / GLYCOLAX) packet Take 17 g by mouth daily. 07/24/16  Yes Emily Filbert, MD  ranitidine (ZANTAC) 150 MG tablet Take 150 mg by mouth 2 (two) times daily.   Yes Historical Provider, MD     VITAL SIGNS:  Blood pressure (!) 168/70, pulse (!) 101, temperature 99.6 F (37.6 C), temperature source Oral, resp. rate (!) 31, height 5\' 3"  (1.6 m),  weight 99.3 kg (219 lb), SpO2 98 %.  PHYSICAL EXAMINATION:  Physical Exam  GENERAL:  36 y.o.-year-old patient lying in the bed with no acute distress.  EYES: Pupils equal, round, reactive to light and accommodation. No scleral icterus. Extraocular muscles intact.  HEENT: Head atraumatic, normocephalic. Oropharynx and nasopharynx clear. No oropharyngeal erythema, moist oral mucosa  NECK:  Supple, no jugular venous distention. No thyroid enlargement, no tenderness.  LUNGS: Normal breath sounds bilaterally, no wheezing, rales, rhonchi. No use of accessory muscles of respiration.  CARDIOVASCULAR: S1, S2 normal. No murmurs, rubs, or gallops.  ABDOMEN: Soft, nontender, nondistended. Bowel sounds present. No organomegaly or mass.  EXTREMITIES: No pedal edema, cyanosis, or clubbing. + 2 pedal & radial pulses b/l.   NEUROLOGIC: Cranial nerves II through XII are intact. No focal Motor or sensory deficits appreciated b/l PSYCHIATRIC: The patient is alert and oriented x 3. Good affect.  SKIN: No obvious rash, lesion, or ulcer.   LABORATORY PANEL:   CBC  Recent Labs Lab 09/28/16 1513  WBC 4.7  HGB 11.6*  HCT 34.6*  PLT 265   ------------------------------------------------------------------------------------------------------------------  Chemistries   Recent Labs Lab 09/28/16 1513  NA 143  K 3.6  CL 114*  CO2 24  GLUCOSE 108*  BUN 12  CREATININE 0.61  CALCIUM 8.5*   ------------------------------------------------------------------------------------------------------------------  Cardiac Enzymes  Recent Labs Lab 09/28/16 1908  TROPONINI <0.03   ------------------------------------------------------------------------------------------------------------------  RADIOLOGY:  Ct Head Wo Contrast  Result Date: 09/28/2016 CLINICAL DATA:  Left-sided weakness for more than 2 hours. EXAM: CT HEAD WITHOUT CONTRAST TECHNIQUE: Contiguous axial images were obtained from the base of the  skull through the vertex without intravenous contrast. COMPARISON:  08/07/2009 FINDINGS: Brain: No evidence for acute hemorrhage, mass lesion, midline shift, hydrocephalus or large infarct. Vascular: No hyperdense vessel or unexpected calcification. Skull: Normal. Negative for fracture or focal lesion. Sinuses/Orbits: Minimal disease in the visualized sphenoid sinuses. Other: None. IMPRESSION: Negative head CT. Electronically Signed   By: Richarda Overlie M.D.   On: 09/28/2016 13:08   Ct Angio Chest Pe W Or Wo Contrast  Result Date: 09/28/2016 CLINICAL DATA:  Left-sided chest pain.  Shortness of breath. EXAM: CT ANGIOGRAPHY CHEST WITH CONTRAST TECHNIQUE: Multidetector CT imaging of the chest was performed using the standard protocol during bolus administration of intravenous contrast. Multiplanar CT image reconstructions and MIPs were obtained to evaluate the vascular anatomy. CONTRAST:  75 mL Isovue 370 COMPARISON:  Two-view chest x-ray 08/05/2009 FINDINGS: Cardiovascular: The heart is mildly enlarged. There is no significant pericardial effusion. Pulmonary arterial opacification is satisfactory. There are no focal filling defects to suggest pulmonary embolus. The study is mildly degraded by breathing motion. The the aorta and great vessel origins are within normal limits. Mediastinum/Nodes: No significant mediastinal or axillary adenopathy is present. Lungs/Pleura: Partial collapse of the right lower lobe with volume loss associated with elevation of the right hemidiaphragm no other focal nodule, mass, or airspace disease is present. There is significant pleural effusion. Upper Abdomen: The right hemidiaphragm is elevated. The liver is unremarkable. Limited imaging of the  upper abdomen is otherwise unremarkable. Musculoskeletal: Vertebral body heights alignment are maintained. No focal lytic or blastic lesions are present. The ribs are intact. Review of the MIP images confirms the above findings. IMPRESSION: 1. No  pulmonary embolus. 2. Volume loss and partial collapse in the right lower lobe associated with elevation of the right hemidiaphragm. There is no central obstructing lesion or etiology of the elevated hemidiaphragm evident. Electronically Signed   By: Marin Robertshristopher  Mattern M.D.   On: 09/28/2016 17:41   [place] Venous Img Lower Bilateral  Result Date: 09/28/2016 CLINICAL DATA:  Bilateral lower extremity pain and edema EXAM: BILATERAL LOWER EXTREMITY VENOUS DOPPLER ULTRASOUND TECHNIQUE: Gray-scale sonography with graded compression, as well as color Doppler and duplex ultrasound were performed to evaluate the lower extremity deep venous systems from the level of the common femoral vein and including the common femoral, femoral, profunda femoral, popliteal and calf veins including the posterior tibial, peroneal and gastrocnemius veins when visible. The superficial great saphenous vein was also interrogated. Spectral Doppler was utilized to evaluate flow at rest and with distal augmentation maneuvers in the common femoral, femoral and popliteal veins. COMPARISON:  CTA chest 09/28/2016 FINDINGS: RIGHT LOWER EXTREMITY Common Femoral Vein: No evidence of thrombus. Normal compressibility, respiratory phasicity and response to augmentation. Saphenofemoral Junction: No evidence of thrombus. Normal compressibility and flow on color Doppler imaging. Profunda Femoral Vein: No evidence of thrombus. Normal compressibility and flow on color Doppler imaging. Femoral Vein: No evidence of thrombus. Normal compressibility, respiratory phasicity and response to augmentation. Popliteal Vein: No evidence of thrombus. Normal compressibility, respiratory phasicity and response to augmentation. Calf Veins: No evidence of thrombus. Normal compressibility and flow on color Doppler imaging. Venous Reflux:  None. Other Findings:  None. LEFT LOWER EXTREMITY Common Femoral Vein: No evidence of thrombus. Normal compressibility, respiratory phasicity  and response to augmentation. Saphenofemoral Junction: No evidence of thrombus. Normal compressibility and flow on color Doppler imaging. Profunda Femoral Vein: No evidence of thrombus. Normal compressibility and flow on color Doppler imaging. Femoral Vein: No evidence of thrombus. Normal compressibility, respiratory phasicity and response to augmentation. Popliteal Vein: No evidence of thrombus. Normal compressibility, respiratory phasicity and response to augmentation. Calf Veins: No evidence of thrombus. Normal compressibility and flow on color Doppler imaging. Venous Reflux:  None. Other Findings:  None. IMPRESSION: No evidence of deep venous thrombosis. Electronically Signed   By: Jasmine PangKim  Fujinaga M.D.   On: 09/28/2016 18:11     IMPRESSION AND PLAN:   * TIA ASA Neuro checks Neuro consult Telemetry MRI, Echo, Carotids Lipid profile  * Right lung lower lobe partial collapse Start incentive spirometer OP pulm f/u NO fever, SOB, O2 need  * DM Sliding scale insulin. Metformin.  * Hypertension on no meds Start Toprol and Lisinopril  * DVT prophylaxis with Lovenox  All the records are reviewed and case discussed with ED provider. Management plans discussed with the patient, family and they are in agreement.  CODE STATUS: FULL CODE  TOTAL TIME TAKING CARE OF THIS PATIENT: 40 minutes.   Milagros LollSudini, Amadi Frady R M.D on 09/28/2016 at 8:39 PM  Between 7am to 6pm - Pager - [phone]  After 6pm go to [email or web address] - password [email or web address]  [facility] [place] [facility]  Office  [phone]  CC: Primary care physician; [facility]  Note: This dictation was prepared with Dragon dictation along with smaller phrase technology. Any transcriptional errors that result from this process are unintentional.

## 2016-09-28 NOTE — ED Notes (Signed)
Pt given meal tray per md order

## 2016-09-28 NOTE — ED Notes (Signed)
Pt awaiting bed assignment, states chest pain is better rates it 9/10.

## 2016-09-28 NOTE — ED Notes (Signed)
Pt requested water, Dr Fanny BienQuale allowed same.

## 2016-09-28 NOTE — ED Notes (Signed)
Soc   Done  Report  Given  To  Dr  Fanny BienQuale

## 2016-09-28 NOTE — ED Notes (Signed)
Pt awaiting admission

## 2016-09-28 NOTE — ED Notes (Signed)
Computer in room for teleneuro consult.

## 2016-09-28 NOTE — ED Notes (Addendum)
Patient transported to US 

## 2016-09-29 ENCOUNTER — Observation Stay: Payer: Medicaid Other

## 2016-09-29 ENCOUNTER — Observation Stay (HOSPITAL_BASED_OUTPATIENT_CLINIC_OR_DEPARTMENT_OTHER)
Admit: 2016-09-29 | Discharge: 2016-09-29 | Disposition: A | Discharge status: Home / Self Care | Payer: Medicaid Other | Attending: Internal Medicine | Admitting: Internal Medicine

## 2016-09-29 DIAGNOSIS — G459 Transient cerebral ischemic attack, unspecified: Secondary | ICD-10-CM

## 2016-09-29 DIAGNOSIS — R29810 Facial weakness: Secondary | ICD-10-CM | POA: Diagnosis not present

## 2016-09-29 DIAGNOSIS — R0781 Pleurodynia: Secondary | ICD-10-CM

## 2016-09-29 DIAGNOSIS — J9811 Atelectasis: Secondary | ICD-10-CM | POA: Diagnosis not present

## 2016-09-29 LAB — GLUCOSE, CAPILLARY
GLUCOSE-CAPILLARY: 100 mg/dL — AB (ref 65–99)
Glucose-Capillary: 90 mg/dL (ref 65–99)
Glucose-Capillary: 107 mg/dL — ABNORMAL HIGH (ref 65–99)
Glucose-Capillary: 98 mg/dL (ref 65–99)

## 2016-09-29 LAB — URINALYSIS, COMPLETE (UACMP) WITH MICROSCOPIC
Bilirubin Urine: NEGATIVE
GLUCOSE, UA: NEGATIVE mg/dL
Hgb urine dipstick: NEGATIVE
Ketones, ur: NEGATIVE mg/dL
Nitrite: NEGATIVE
PH: 6 (ref 5.0–8.0)
Protein, ur: NEGATIVE mg/dL
Specific Gravity, Urine: 1.027 (ref 1.005–1.030)

## 2016-09-29 LAB — ALBUMIN: ALBUMIN: 3.4 g/dL — AB (ref 3.5–5.0)

## 2016-09-29 LAB — ECHOCARDIOGRAM COMPLETE
Height: 63 in
Weight: 3504 oz

## 2016-09-29 LAB — TSH: TSH: 3.172 u[IU]/mL (ref 0.350–4.500)

## 2016-09-29 LAB — LIPID PANEL
CHOLESTEROL: 139 mg/dL (ref 0–200)
HDL: 52 mg/dL (ref >40)
LDL Cholesterol: 79 mg/dL (ref 0–99)
TRIGLYCERIDES: 40 mg/dL (ref <150)
Total CHOL/HDL Ratio: 2.7 RATIO
VLDL: 8 mg/dL (ref 0–40)

## 2016-09-29 MED ORDER — IPRATROPIUM-ALBUTEROL 0.5-2.5 (3) MG/3ML IN SOLN
3.0000 mL | RESPIRATORY_TRACT | Status: DC
  Administered 2016-09-29 – 2016-09-30 (×5): 3 mL via RESPIRATORY_TRACT
  Filled 2016-09-29 (×6): qty 3

## 2016-09-29 MED ORDER — BUDESONIDE 0.5 MG/2ML IN SUSP
0.5000 mg | Freq: Two times a day (BID) | RESPIRATORY_TRACT | Status: DC
  Administered 2016-09-29 – 2016-09-30 (×2): 0.5 mg via RESPIRATORY_TRACT
  Filled 2016-09-29 (×2): qty 2

## 2016-09-29 NOTE — Consult Note (Signed)
Name: Megan Manning MRN: [id number] DOB: 17-Oct-1980    ADMISSION DATE:  09/28/2016 CONSULTATION DATE:  09/28/16  REFERRING MD :  Juliene Pina  CHIEF COMPLAINT: abnormal CT chest   STUDIES:  CT chest 09/2016 RT elevated hemidiaphragm with atalectasis  CXR shows this same findings since 2010  HISTORY OF PRESENT ILLNESS:   36 y.o. AAF with a known history of diabetes, autism presents to the emergency room brought in by her caregiver due to left facial droop and left arm and leg weakness.  -Patient's facial droop has resolved and still continues to complain of mild left lower extremity weakness. Neurology work up pending -she was having some chest wall pain and CT chest ordered-the findings have been presenet since 2010 Patient states that she has chronic SOB, worse over last several days, has some sluured sleepch now She looks comfortably breathing, family at bedside updated She has a weak cough and does not take deep breaths She has no fevers, chills. No NVD   PAST MEDICAL HISTORY :   has a past medical history of Diabetes mellitus without complication (HCC); Hypertension; and Thyroid disease.  has no past surgical history on file. Prior to Admission medications   Medication Sig Start Date End Date Taking? Authorizing Provider  clozapine (CLOZARIL) 50 MG tablet Take 150 mg by mouth daily.   Yes Historical Provider, MD  docusate sodium (COLACE) 100 MG capsule Take 100 mg by mouth 2 (two) times daily as needed for mild constipation.   Yes Historical Provider, MD  haloperidol (HALDOL) 5 MG tablet Take 5 mg by mouth daily.   Yes Historical Provider, MD  levothyroxine (SYNTHROID, LEVOTHROID) 88 MCG tablet Take 88 mcg by mouth daily.   Yes Historical Provider, MD  loratadine (CLARITIN) 10 MG tablet Take 10 mg by mouth daily.   Yes Historical Provider, MD  magnesium oxide (MAG-OX) 400 MG tablet Take 400 mg by mouth daily.   Yes Historical Provider, MD  metFORMIN (GLUCOPHAGE) 500 MG tablet Take 500  mg by mouth daily.   Yes Historical Provider, MD  ondansetron (ZOFRAN ODT) 4 MG disintegrating tablet Take 1 tablet (4 mg total) by mouth every 8 (eight) hours as needed for nausea or vomiting. 07/24/16  Yes Emily Filbert, MD  polyethylene glycol (MIRALAX / GLYCOLAX) packet Take 17 g by mouth daily. 07/24/16  Yes Emily Filbert, MD  ranitidine (ZANTAC) 150 MG tablet Take 150 mg by mouth 2 (two) times daily.   Yes Historical Provider, MD   No Known Allergies  FAMILY HISTORY:  family history is not on file. SOCIAL HISTORY:  reports that she has never smoked. She has never used smokeless tobacco. She reports that she does not drink alcohol or use drugs.  REVIEW OF SYSTEMS:   Constitutional: Negative for fever, chills, weight loss, malaise/fatigue and diaphoresis.  HENT: Negative for hearing loss, ear pain, nosebleeds, congestion, sore throat, neck pain, tinnitus and ear discharge.   Eyes: Negative for blurred vision, double vision, photophobia, pain, discharge and redness.  Respiratory:weak cough, -hemoptysis, -sputum production, +shortness of breath, +wheezing and -stridor.   Cardiovascular: Negative for chest pain, palpitations, orthopnea, claudication, leg swelling and PND.  Gastrointestinal: Negative for heartburn, nausea, vomiting, abdominal pain, diarrhea, constipation, blood in stool and melena.  Genitourinary: Negative for dysuria, urgency, frequency, hematuria and flank pain.  Musculoskeletal: Negative for myalgias, back pain, joint pain and falls.  Skin: Negative for itching and rash.  Neurological: Negative for dizziness, tingling, tremors, sensory change, speech change,  focal weakness, seizures, loss of consciousness, weakness and headaches.  Endo/Heme/Allergies: Negative for environmental allergies and polydipsia. Does not bruise/bleed easily.    VITAL SIGNS: Temp:  [98 F (36.7 C)-98.9 F (37.2 C)] 98.4 F (36.9 C) (03/06 1214) Pulse Rate:  [93-114] 94 (03/06  1214) Resp:  [16-31] 18 (03/06 1214) BP: (117-174)/(50-104) 136/78 (03/06 1214) SpO2:  [98 %-100 %] 100 % (03/06 1214)  Physical Examination:   GENERAL:NAD, no fevers, chills, no weakness no fatigue HEAD: Normocephalic, atraumatic.  EYES: Pupils equal, round, reactive to light. Extraocular muscles intact. No scleral icterus.  MOUTH: Moist mucosal membrane. Dentition intact. No abscess noted.  EAR, NOSE, THROAT: Clear without exudates. No external lesions.  NECK: Supple. No thyromegaly. No nodules. No JVD.  PULMONARY: Diffuse coarse rhonchi right sided +wheezes CARDIOVASCULAR: S1 and S2. Regular rate and rhythm. No murmurs, rubs, or gallops. No edema. Pedal pulses 2+ bilaterally.  GASTROINTESTINAL: Soft, nontender, nondistended. No masses. Positive bowel sounds. No hepatosplenomegaly.  MUSCULOSKELETAL: No swelling, clubbing, or edema. Range of motion full in all extremities.  NEUROLOGIC: Cranial nerves II through XII are intact. +weakness of left arm/left leg  PSYCHIATRIC: Mood, affect within normal limits. The patient is awake, alert and oriented x 3.        Recent Labs Lab 09/28/16 1513  NA 143  K 3.6  CL 114*  CO2 24  BUN 12  CREATININE 0.61  GLUCOSE 108*    Recent Labs Lab 09/28/16 1513 09/28/16 2240  HGB 11.6* 11.1*  HCT 34.6* 33.2*  WBC 4.7 5.3  PLT 265 240   Ct Head Wo Contrast  Result Date: 09/28/2016 CLINICAL DATA:  Left-sided weakness for more than 2 hours. EXAM: CT HEAD WITHOUT CONTRAST TECHNIQUE: Contiguous axial images were obtained from the base of the skull through the vertex without intravenous contrast. COMPARISON:  08/07/2009 FINDINGS: Brain: No evidence for acute hemorrhage, mass lesion, midline shift, hydrocephalus or large infarct. Vascular: No hyperdense vessel or unexpected calcification. Skull: Normal. Negative for fracture or focal lesion. Sinuses/Orbits: Minimal disease in the visualized sphenoid sinuses. Other: None. IMPRESSION: Negative head  CT. Electronically Signed   By: Richarda Overlie M.D.   On: 09/28/2016 13:08   Ct Angio Chest Pe W Or Wo Contrast  Result Date: 09/28/2016 CLINICAL DATA:  Left-sided chest pain.  Shortness of breath. EXAM: CT ANGIOGRAPHY CHEST WITH CONTRAST TECHNIQUE: Multidetector CT imaging of the chest was performed using the standard protocol during bolus administration of intravenous contrast. Multiplanar CT image reconstructions and MIPs were obtained to evaluate the vascular anatomy. CONTRAST:  75 mL Isovue 370 COMPARISON:  Two-view chest x-ray 08/05/2009 FINDINGS: Cardiovascular: The heart is mildly enlarged. There is no significant pericardial effusion. Pulmonary arterial opacification is satisfactory. There are no focal filling defects to suggest pulmonary embolus. The study is mildly degraded by breathing motion. The the aorta and great vessel origins are within normal limits. Mediastinum/Nodes: No significant mediastinal or axillary adenopathy is present. Lungs/Pleura: Partial collapse of the right lower lobe with volume loss associated with elevation of the right hemidiaphragm no other focal nodule, mass, or airspace disease is present. There is significant pleural effusion. Upper Abdomen: The right hemidiaphragm is elevated. The liver is unremarkable. Limited imaging of the upper abdomen is otherwise unremarkable. Musculoskeletal: Vertebral body heights alignment are maintained. No focal lytic or blastic lesions are present. The ribs are intact. Review of the MIP images confirms the above findings. IMPRESSION: 1. No pulmonary embolus. 2. Volume loss and partial collapse in the right  lower lobe associated with elevation of the right hemidiaphragm. There is no central obstructing lesion or etiology of the elevated hemidiaphragm evident. Electronically Signed   By: Marin Robertshristopher  Mattern M.D.   On: 09/28/2016 17:41   [place] Venous Img Lower Bilateral  Result Date: 09/28/2016 CLINICAL DATA:  Bilateral lower extremity pain and  edema EXAM: BILATERAL LOWER EXTREMITY VENOUS DOPPLER ULTRASOUND TECHNIQUE: Gray-scale sonography with graded compression, as well as color Doppler and duplex ultrasound were performed to evaluate the lower extremity deep venous systems from the level of the common femoral vein and including the common femoral, femoral, profunda femoral, popliteal and calf veins including the posterior tibial, peroneal and gastrocnemius veins when visible. The superficial great saphenous vein was also interrogated. Spectral Doppler was utilized to evaluate flow at rest and with distal augmentation maneuvers in the common femoral, femoral and popliteal veins. COMPARISON:  CTA chest 09/28/2016 FINDINGS: RIGHT LOWER EXTREMITY Common Femoral Vein: No evidence of thrombus. Normal compressibility, respiratory phasicity and response to augmentation. Saphenofemoral Junction: No evidence of thrombus. Normal compressibility and flow on color Doppler imaging. Profunda Femoral Vein: No evidence of thrombus. Normal compressibility and flow on color Doppler imaging. Femoral Vein: No evidence of thrombus. Normal compressibility, respiratory phasicity and response to augmentation. Popliteal Vein: No evidence of thrombus. Normal compressibility, respiratory phasicity and response to augmentation. Calf Veins: No evidence of thrombus. Normal compressibility and flow on color Doppler imaging. Venous Reflux:  None. Other Findings:  None. LEFT LOWER EXTREMITY Common Femoral Vein: No evidence of thrombus. Normal compressibility, respiratory phasicity and response to augmentation. Saphenofemoral Junction: No evidence of thrombus. Normal compressibility and flow on color Doppler imaging. Profunda Femoral Vein: No evidence of thrombus. Normal compressibility and flow on color Doppler imaging. Femoral Vein: No evidence of thrombus. Normal compressibility, respiratory phasicity and response to augmentation. Popliteal Vein: No evidence of thrombus. Normal  compressibility, respiratory phasicity and response to augmentation. Calf Veins: No evidence of thrombus. Normal compressibility and flow on color Doppler imaging. Venous Reflux:  None. Other Findings:  None. IMPRESSION: No evidence of deep venous thrombosis. Electronically Signed   By: Jasmine PangKim  Fujinaga M.D.   On: 09/28/2016 18:11     Ct chest 09/28/16 I have Independently reviewed images of  Ct chest   on 09/29/2016 Interpretation:RT hemidiaphragm with atelectasis She has had this issues since 2010 based on CXR findings   ASSESSMENT / PLAN: 36 yo morbidly obese AAF with chronic RT sided elevated Hemidiaphragm with SOB in setting of probable acute CVA  1.she will need aggressive cardio-pulmonary toilet-chest Vest PT ordered 2.Incentive spirometry 3.start aggressive BD therapy 4.oxygen as needed 5.chest PT  Will follow along.  Patient/Family are satisfied with Plan of action and management. All questions answered  Lucie LeatherKurian David Windy Dudek, M.D.  [facility]  Medical Director [facility] Medical Director [facility]

## 2016-09-29 NOTE — Progress Notes (Signed)
*  PRELIMINARY RESULTS* Echocardiogram 2D Echocardiogram has been performed.  Cristela BlueHege, Sufyaan Palma 09/29/2016, 2:38 PM

## 2016-09-29 NOTE — Progress Notes (Signed)
MEDICATION RELATED CONSULT NOTE - INITIAL   Pharmacy Consult for Clozapine  No Known Allergies  Patient Measurements: Height: 5\' 3"  (160 cm) Weight: 219 lb (99.3 kg) IBW/kg (Calculated) : 52.4  Vital Signs: Temp: 98.7 F (37.1 C) (03/06 0819) Temp Source: Oral (03/06 0539) BP: 138/86 (03/06 0819) Pulse Rate: 102 (03/06 0819) Intake/Output from previous day: 03/05 0701 - 03/06 0700 In: 237 [P.O.:237] Out: -  Intake/Output from this shift: No intake/output data recorded.  Labs: Lab Results  Component Value Date   NEUTROABS 2.5 09/28/2016    Recent Labs  09/28/16 1513 09/28/16 2240  WBC 4.7 5.3  HGB 11.6* 11.1*  HCT 34.6* 33.2*  PLT 265 240  CREATININE 0.61  --    Estimated Creatinine Clearance: 109.3 mL/min (by C-G formula based on SCr of 0.61 mg/dL).   Microbiology: No results found for this or any previous visit (from the past 720 hour(s)).  Medical History: Past Medical History:  Diagnosis Date  . Diabetes mellitus without complication (HCC)   . Hypertension   . Thyroid disease     Assessment: 36 yo old receiving clozapine outpatient. Pharmacy consulted for monitoring and reporting of clozapine and ANC levels to Clozapine REMS program.   Lab Results  Component Value Date   NEUTROABS 2.5 09/28/2016    Plan:  Patient is eligible to receive clozapine  with weekly ANC monitoring and reporting per Clozapine REMS program. Pharmacy will continue to order and report labs as needed per clozapine protocol.      Gardner CandleSheema M Zeba Luby, PharmD, BCPS Clinical Pharmacist 09/29/2016 8:51 AM

## 2016-09-29 NOTE — Progress Notes (Signed)
Pt complains of BLE numbness.  Neuro assessment is negative. Pt NIH is 0. Removed overly tight leggings.  Pt has edema to both legs greater on the left (+3 pitting edema). When touching both feet and calves pt states "ouch". Poor effort to raise legs but no true drift. Pt still with some shortness of breath but oxygen saturation remains 98% on RA.  Encouraged pt to pump her feet and change positions in bed. Henriette CombsSarah Cono Gebhard RN

## 2016-09-29 NOTE — Progress Notes (Signed)
[employer] [facility] - [place] at [facility]   PATIENT NAME: Megan Manning    MR#:  [id number]  DATE OF BIRTH:  24-Sep-1980  SUBJECTIVE:  Having chest pain  REVIEW OF SYSTEMS:    Review of Systems  Constitutional: Negative.  Negative for chills, fever and malaise/fatigue.  HENT: Negative.  Negative for ear discharge, ear pain, hearing loss, nosebleeds and sore throat.   Eyes: Negative.  Negative for blurred vision and pain.  Respiratory: Negative.  Negative for cough, hemoptysis, shortness of breath and wheezing.   Cardiovascular: Positive for chest pain and leg swelling. Negative for palpitations.  Gastrointestinal: Negative.  Negative for abdominal pain, blood in stool, diarrhea, nausea and vomiting.  Genitourinary: Negative.  Negative for dysuria.  Musculoskeletal: Negative.  Negative for back pain.  Skin: Negative.   Neurological: Negative for dizziness, tremors, speech change, focal weakness, seizures and headaches.  Endo/Heme/Allergies: Negative.  Does not bruise/bleed easily.  Psychiatric/Behavioral: Negative.  Negative for depression, hallucinations and suicidal ideas.    Tolerating Diet:yes      DRUG ALLERGIES:  No Known Allergies  VITALS:  Blood pressure (!) 143/85, pulse (!) 103, temperature 98 F (36.7 C), temperature source Oral, resp. rate 18, height 5\' 3"  (1.6 m), weight 99.3 kg (219 lb), SpO2 99 %.  PHYSICAL EXAMINATION:   Physical Exam  Constitutional: She is oriented to person, place, and time and well-developed, well-nourished, and in no distress. No distress.  HENT:  Head: Normocephalic.  Eyes: No scleral icterus.  Neck: Normal range of motion. Neck supple. No JVD present. No tracheal deviation present.  Cardiovascular: Normal rate, regular rhythm and normal heart sounds.  Exam reveals no gallop and no friction rub.   No murmur heard. Pulmonary/Chest: Effort normal and breath sounds normal. No respiratory distress. She has no wheezes. She has  no rales. She exhibits no tenderness.  Decreased right base  Abdominal: Soft. Bowel sounds are normal. She exhibits no distension and no mass. There is no tenderness. There is no rebound and no guarding.  Musculoskeletal: Normal range of motion. She exhibits no edema.  Neurological: She is alert and oriented to person, place, and time.  Skin: Skin is warm. No rash noted. No erythema.  Psychiatric: Affect and judgment normal.      LABORATORY PANEL:   CBC  Recent Labs Lab 09/28/16 2240  WBC 5.3  HGB 11.1*  HCT 33.2*  PLT 240   ------------------------------------------------------------------------------------------------------------------  Chemistries   Recent Labs Lab 09/28/16 1513  NA 143  K 3.6  CL 114*  CO2 24  GLUCOSE 108*  BUN 12  CREATININE 0.61  CALCIUM 8.5*   ------------------------------------------------------------------------------------------------------------------  Cardiac Enzymes  Recent Labs Lab 09/28/16 1908  TROPONINI <0.03   ------------------------------------------------------------------------------------------------------------------  RADIOLOGY:  Ct Head Wo Contrast  Result Date: 09/28/2016 CLINICAL DATA:  Left-sided weakness for more than 2 hours. EXAM: CT HEAD WITHOUT CONTRAST TECHNIQUE: Contiguous axial images were obtained from the base of the skull through the vertex without intravenous contrast. COMPARISON:  08/07/2009 FINDINGS: Brain: No evidence for acute hemorrhage, mass lesion, midline shift, hydrocephalus or large infarct. Vascular: No hyperdense vessel or unexpected calcification. Skull: Normal. Negative for fracture or focal lesion. Sinuses/Orbits: Minimal disease in the visualized sphenoid sinuses. Other: None. IMPRESSION: Negative head CT. Electronically Signed   By: Richarda Overlie M.D.   On: 09/28/2016 13:08   Ct Angio Chest Pe W Or Wo Contrast  Result Date: 09/28/2016 CLINICAL DATA:  Left-sided chest pain.  Shortness of  breath. EXAM: CT  ANGIOGRAPHY CHEST WITH CONTRAST TECHNIQUE: Multidetector CT imaging of the chest was performed using the standard protocol during bolus administration of intravenous contrast. Multiplanar CT image reconstructions and MIPs were obtained to evaluate the vascular anatomy. CONTRAST:  75 mL Isovue 370 COMPARISON:  Two-view chest x-ray 08/05/2009 FINDINGS: Cardiovascular: The heart is mildly enlarged. There is no significant pericardial effusion. Pulmonary arterial opacification is satisfactory. There are no focal filling defects to suggest pulmonary embolus. The study is mildly degraded by breathing motion. The the aorta and great vessel origins are within normal limits. Mediastinum/Nodes: No significant mediastinal or axillary adenopathy is present. Lungs/Pleura: Partial collapse of the right lower lobe with volume loss associated with elevation of the right hemidiaphragm no other focal nodule, mass, or airspace disease is present. There is significant pleural effusion. Upper Abdomen: The right hemidiaphragm is elevated. The liver is unremarkable. Limited imaging of the upper abdomen is otherwise unremarkable. Musculoskeletal: Vertebral body heights alignment are maintained. No focal lytic or blastic lesions are present. The ribs are intact. Review of the MIP images confirms the above findings. IMPRESSION: 1. No pulmonary embolus. 2. Volume loss and partial collapse in the right lower lobe associated with elevation of the right hemidiaphragm. There is no central obstructing lesion or etiology of the elevated hemidiaphragm evident. Electronically Signed   By: Marin Roberts M.D.   On: 09/28/2016 17:41   US Venous Img Lower Bilateral  Result Date: 09/28/2016 CLINICAL DATA:  Bilateral lower extremity pain and edema EXAM: BILATERAL LOWER EXTREMITY VENOUS DOPPLER ULTRASOUND TECHNIQUE: Gray-scale sonography with graded compression, as well as color Doppler and duplex ultrasound were performed to  evaluate the lower extremity deep venous systems from the level of the common femoral vein and including the common femoral, femoral, profunda femoral, popliteal and calf veins including the posterior tibial, peroneal and gastrocnemius veins when visible. The superficial great saphenous vein was also interrogated. Spectral Doppler was utilized to evaluate flow at rest and with distal augmentation maneuvers in the common femoral, femoral and popliteal veins. COMPARISON:  CTA chest 09/28/2016 FINDINGS: RIGHT LOWER EXTREMITY Common Femoral Vein: No evidence of thrombus. Normal compressibility, respiratory phasicity and response to augmentation. Saphenofemoral Junction: No evidence of thrombus. Normal compressibility and flow on color Doppler imaging. Profunda Femoral Vein: No evidence of thrombus. Normal compressibility and flow on color Doppler imaging. Femoral Vein: No evidence of thrombus. Normal compressibility, respiratory phasicity and response to augmentation. Popliteal Vein: No evidence of thrombus. Normal compressibility, respiratory phasicity and response to augmentation. Calf Veins: No evidence of thrombus. Normal compressibility and flow on color Doppler imaging. Venous Reflux:  None. Other Findings:  None. LEFT LOWER EXTREMITY Common Femoral Vein: No evidence of thrombus. Normal compressibility, respiratory phasicity and response to augmentation. Saphenofemoral Junction: No evidence of thrombus. Normal compressibility and flow on color Doppler imaging. Profunda Femoral Vein: No evidence of thrombus. Normal compressibility and flow on color Doppler imaging. Femoral Vein: No evidence of thrombus. Normal compressibility, respiratory phasicity and response to augmentation. Popliteal Vein: No evidence of thrombus. Normal compressibility, respiratory phasicity and response to augmentation. Calf Veins: No evidence of thrombus. Normal compressibility and flow on color Doppler imaging. Venous Reflux:  None. Other  Findings:  None. IMPRESSION: No evidence of deep venous thrombosis. Electronically Signed   By: Jasmine Pang M.D.   On: 09/28/2016 18:11     ASSESSMENT AND PLAN:   36 year old female with autism who presents with chest pain and left facial droop and left arm and leg weakness  1. Left  facial droop and left extremity weakness: Patient is being worked up for TIA/CVA with echocardiogram, MRI and carotid Doppler Continue neuro checks Continue aspirin Await neurology and PT consult LDL 79 Consider adding statin 2. Lower extremity edema: Check echocardiogram Check albumin and TSH Venous Dopplers were negative for DVT   3. Diabetes: Continue sliding scale insulin  4. Chest pain with CT scan negative for PE however there is Volume loss and partial collapse in the right lower lobe associated with elevation of the right hemidiaphragm. Obtain pulmonary consultation [facility]  5. Autism with mood disorder: Continue Haldol and clozapine  6. Hypothyroid on Synthroid  7. Essential hypertension: Continue metoprolol and lisinopril Management plans discussed with the patient and she is in agreement.  CODE STATUS: full  TOTAL TIME TAKING CARE OF THIS PATIENT: 30 minutes.     POSSIBLE D/C tomorrow, DEPENDING ON CLINICAL CONDITION.   Cherylynn Liszewski M.D on 09/29/2016 at 10:56 AM  Between 7am to 6pm - Pager - [phone] After 6pm go to [email or web address] - Social research officer, governmentpassword [email or web address]  [facility]  Office  [phone]  CC: Primary care physician; [facility]  Note: This dictation was prepared with Dragon dictation along with smaller phrase technology. Any transcriptional errors that result from this process are unintentional.

## 2016-09-29 NOTE — Consult Note (Signed)
Referring Physician: Mody    Chief Complaint: Left sided weakness  HPI: Megan Manning is an 36 y.o. female with a history of autism, hypertension and DM who is a poor historian. Per the chart she developed chest pain on yesterday.  Described the pain as sharp and then was noted by her caregiver (who is new) to be drooling, act strangely and have speech change with left sided weakness.  Baseline is unclear but today patient continues to complain of chest pain along with both legs being weak and paresthesias all over.    Date last known well: Date: 09/28/2016 Time last known well: Unable to determine tPA Given: No: Not a stroke  Past Medical History:  Diagnosis Date  . Diabetes mellitus without complication (HCC)   . Hypertension   . Thyroid disease     History reviewed. No pertinent surgical history.  Family History  Problem Relation Age of Onset  . CVA Neg Hx    Social History:  reports that she has never smoked. She has never used smokeless tobacco. She reports that she does not drink alcohol or use drugs.  Allergies: No Known Allergies  Medications:  I have reviewed the patient's current medications. Prior to Admission:  Prescriptions Prior to Admission  Medication Sig Dispense Refill Last Dose  . clozapine (CLOZARIL) 50 MG tablet Take 150 mg by mouth daily.   unknown at unknown  . docusate sodium (COLACE) 100 MG capsule Take 100 mg by mouth 2 (two) times daily as needed for mild constipation.   prn at prn  . haloperidol (HALDOL) 5 MG tablet Take 5 mg by mouth daily.   unknown at unknown  . levothyroxine (SYNTHROID, LEVOTHROID) 88 MCG tablet Take 88 mcg by mouth daily.   unknown at unknown  . loratadine (CLARITIN) 10 MG tablet Take 10 mg by mouth daily.   unknown at unknown  . magnesium oxide (MAG-OX) 400 MG tablet Take 400 mg by mouth daily.   unknown at unknown  . metFORMIN (GLUCOPHAGE) 500 MG tablet Take 500 mg by mouth daily.   unknown at unknown  . ondansetron (ZOFRAN  ODT) 4 MG disintegrating tablet Take 1 tablet (4 mg total) by mouth every 8 (eight) hours as needed for nausea or vomiting. 20 tablet 0   . polyethylene glycol (MIRALAX / GLYCOLAX) packet Take 17 g by mouth daily. 14 each 0 prn at prn  . ranitidine (ZANTAC) 150 MG tablet Take 150 mg by mouth 2 (two) times daily.   unknown at unknown   Scheduled: . aspirin EC  81 mg Oral Daily  . atorvastatin  40 mg Oral q1800  . cloZAPine  150 mg Oral Daily  . enoxaparin (LOVENOX) injection  40 mg Subcutaneous Q24H  . famotidine  20 mg Oral Daily  . haloperidol  5 mg Oral Daily  . insulin aspart  0-9 Units Subcutaneous TID WC  . levothyroxine  88 mcg Oral QAC breakfast  . lisinopril  10 mg Oral Daily  . metoprolol succinate  25 mg Oral Daily  . polyethylene glycol  17 g Oral Daily  . sodium chloride flush  3 mL Intravenous Q12H    ROS: History obtained from the patient  General ROS: negative for - chills, fatigue, fever, night sweats, weight gain or weight loss Psychological ROS: negative for - behavioral disorder, hallucinations, memory difficulties, mood swings or suicidal ideation Ophthalmic ROS: negative for - blurry vision, double vision, eye pain or loss of vision ENT ROS: negative for -  epistaxis, nasal discharge, oral lesions, sore throat, tinnitus or vertigo Allergy and Immunology ROS: negative for - hives or itchy/watery eyes Hematological and Lymphatic ROS: negative for - bleeding problems, bruising or swollen lymph nodes Endocrine ROS: negative for - galactorrhea, hair pattern changes, polydipsia/polyuria or temperature intolerance Respiratory ROS: negative for - cough, hemoptysis, shortness of breath or wheezing Cardiovascular ROS: LE edema Gastrointestinal ROS: negative for - abdominal pain, diarrhea, hematemesis, nausea/vomiting or stool incontinence Genito-Urinary ROS: negative for - dysuria, hematuria, incontinence or urinary frequency/urgency Musculoskeletal ROS: pain in all  extremities Neurological ROS: as noted in HPI Dermatological ROS: negative for rash and skin lesion changes  Physical Examination: Blood pressure 136/78, pulse 94, temperature 98.4 F (36.9 C), temperature source Oral, resp. rate 18, height 5\' 3"  (1.6 m), weight 99.3 kg (219 lb), SpO2 100 %.  HEENT-  Normocephalic, no lesions, without obvious abnormality.  Normal external eye and conjunctiva.  Normal TM's bilaterally.  Normal auditory canals and external ears. Normal external nose, mucus membranes and septum.  Normal pharynx. Cardiovascular- S1, S2 normal, pulses palpable throughout   Lungs- chest clear, no wheezing, rales, normal symmetric air entry Abdomen- soft, non-tender; bowel sounds normal; no masses,  no organomegaly Extremities- BLE edema Lymph-no adenopathy palpable Musculoskeletal-muscular tenderness throughout Skin-warm and dry, no hyperpigmentation, vitiligo, or suspicious lesions  Neurological Examination   Mental Status: Alert.  Speech fluent without evidence of aphasia.  Able to follow 3 step commands without difficulty. Cranial Nerves: II: Discs flat bilaterally; Visual fields grossly normal, pupils equal, round, reactive to light and accommodation III,IV, VI: ptosis not present, extra-ocular motions intact bilaterally V,VII: smile symmetric, facial light touch sensation decreased overall  VIII: hearing normal bilaterally IX,X: gag reflex present XI: bilateral shoulder shrug XII: midline tongue extension Motor: Patient able to lift both arms above her head but describes pain.  No focal weakness noted.  Does not lift either leg off the bed to command due to complaints of pain but when attempting to put socks on is able to lift each leg off the bed with no focal weakness noted.    Sensory: Pinprick and light touch decreased in all extremities Deep Tendon Reflexes: 2+ and symmetric throughout Plantars: Right: mute   Left: mute Cerebellar: Normal finger-to-nose  testing Gait: not tested due to safety concerns    Laboratory Studies:  Basic Metabolic Panel:  Recent Labs Lab 09/28/16 1513  NA 143  K 3.6  CL 114*  CO2 24  GLUCOSE 108*  BUN 12  CREATININE 0.61  CALCIUM 8.5*    Liver Function Tests:  Recent Labs Lab 09/29/16 0610  ALBUMIN 3.4*   No results for input(s): LIPASE, AMYLASE in the last 168 hours. No results for input(s): AMMONIA in the last 168 hours.  CBC:  Recent Labs Lab 09/28/16 1513 09/28/16 2240  WBC 4.7 5.3  NEUTROABS  --  2.5  HGB 11.6* 11.1*  HCT 34.6* 33.2*  MCV 85.0 84.2  PLT 265 240    Cardiac Enzymes:  Recent Labs Lab 09/28/16 1908  TROPONINI <0.03    BNP: Invalid input(s): POCBNP  CBG:  Recent Labs Lab 09/28/16 1300 09/28/16 2251 09/29/16 0817 09/29/16 1155  GLUCAP 117* 94 90 100*    Microbiology: No results found for this or any previous visit.  Coagulation Studies: No results for input(s): LABPROT, INR in the last 72 hours.  Urinalysis:  Recent Labs Lab 09/29/16 0310  COLORURINE YELLOW*  LABSPEC 1.027  PHURINE 6.0  GLUCOSEU NEGATIVE  HGBUR NEGATIVE  BILIRUBINUR NEGATIVE  KETONESUR NEGATIVE  PROTEINUR NEGATIVE  NITRITE NEGATIVE  LEUKOCYTESUR TRACE*    Lipid Panel:    Component Value Date/Time   CHOL 139 09/29/2016 0610   TRIG 40 09/29/2016 0610   HDL 52 09/29/2016 0610   CHOLHDL 2.7 09/29/2016 0610   VLDL 8 09/29/2016 0610   LDLCALC 79 09/29/2016 0610    HgbA1C:  Lab Results  Component Value Date   HGBA1C  08/10/2009    5.8 (NOTE) The ADA recommends the following therapeutic goal for glycemic control related to Hgb A1c measurement: Goal of therapy: <6.5 Hgb A1c  Reference: American Diabetes Association: Clinical Practice Recommendations 2010, Diabetes Care, 2010, 33: (Suppl  1).    Urine Drug Screen:     Component Value Date/Time   LABOPIA NONE DETECTED 08/04/2009 1837   COCAINSCRNUR NONE DETECTED 08/04/2009 1837   LABBENZ NONE DETECTED  08/04/2009 1837   AMPHETMU NONE DETECTED 08/04/2009 1837   THCU NONE DETECTED 08/04/2009 1837   LABBARB  08/04/2009 1837    NONE DETECTED        DRUG SCREEN FOR MEDICAL PURPOSES ONLY.  IF CONFIRMATION IS NEEDED FOR ANY PURPOSE, NOTIFY LAB WITHIN 5 DAYS.        LOWEST DETECTABLE LIMITS FOR URINE DRUG SCREEN Drug Class       Cutoff (ng/mL) Amphetamine      1000 Barbiturate      200 Benzodiazepine   200 Tricyclics       300 Opiates          300 Cocaine          300 THC              50    Alcohol Level: No results for input(s): ETH in the last 168 hours.  Other results: EKG: sinus tachycardia at 122 bpm  Imaging: Ct Head Wo Contrast  Result Date: 09/28/2016 CLINICAL DATA:  Left-sided weakness for more than 2 hours. EXAM: CT HEAD WITHOUT CONTRAST TECHNIQUE: Contiguous axial images were obtained from the base of the skull through the vertex without intravenous contrast. COMPARISON:  08/07/2009 FINDINGS: Brain: No evidence for acute hemorrhage, mass lesion, midline shift, hydrocephalus or large infarct. Vascular: No hyperdense vessel or unexpected calcification. Skull: Normal. Negative for fracture or focal lesion. Sinuses/Orbits: Minimal disease in the visualized sphenoid sinuses. Other: None. IMPRESSION: Negative head CT. Electronically Signed   By: Richarda Overlie M.D.   On: 09/28/2016 13:08   Ct Angio Chest Pe W Or Wo Contrast  Result Date: 09/28/2016 CLINICAL DATA:  Left-sided chest pain.  Shortness of breath. EXAM: CT ANGIOGRAPHY CHEST WITH CONTRAST TECHNIQUE: Multidetector CT imaging of the chest was performed using the standard protocol during bolus administration of intravenous contrast. Multiplanar CT image reconstructions and MIPs were obtained to evaluate the vascular anatomy. CONTRAST:  75 mL Isovue 370 COMPARISON:  Two-view chest x-ray 08/05/2009 FINDINGS: Cardiovascular: The heart is mildly enlarged. There is no significant pericardial effusion. Pulmonary arterial opacification is  satisfactory. There are no focal filling defects to suggest pulmonary embolus. The study is mildly degraded by breathing motion. The the aorta and great vessel origins are within normal limits. Mediastinum/Nodes: No significant mediastinal or axillary adenopathy is present. Lungs/Pleura: Partial collapse of the right lower lobe with volume loss associated with elevation of the right hemidiaphragm no other focal nodule, mass, or airspace disease is present. There is significant pleural effusion. Upper Abdomen: The right hemidiaphragm is elevated. The liver is unremarkable. Limited imaging of the upper  abdomen is otherwise unremarkable. Musculoskeletal: Vertebral body heights alignment are maintained. No focal lytic or blastic lesions are present. The ribs are intact. Review of the MIP images confirms the above findings. IMPRESSION: 1. No pulmonary embolus. 2. Volume loss and partial collapse in the right lower lobe associated with elevation of the right hemidiaphragm. There is no central obstructing lesion or etiology of the elevated hemidiaphragm evident. Electronically Signed   By: Marin Robertshristopher  Mattern M.D.   On: 09/28/2016 17:41   [place] Venous Img Lower Bilateral  Result Date: 09/28/2016 CLINICAL DATA:  Bilateral lower extremity pain and edema EXAM: BILATERAL LOWER EXTREMITY VENOUS DOPPLER ULTRASOUND TECHNIQUE: Gray-scale sonography with graded compression, as well as color Doppler and duplex ultrasound were performed to evaluate the lower extremity deep venous systems from the level of the common femoral vein and including the common femoral, femoral, profunda femoral, popliteal and calf veins including the posterior tibial, peroneal and gastrocnemius veins when visible. The superficial great saphenous vein was also interrogated. Spectral Doppler was utilized to evaluate flow at rest and with distal augmentation maneuvers in the common femoral, femoral and popliteal veins. COMPARISON:  CTA chest 09/28/2016  FINDINGS: RIGHT LOWER EXTREMITY Common Femoral Vein: No evidence of thrombus. Normal compressibility, respiratory phasicity and response to augmentation. Saphenofemoral Junction: No evidence of thrombus. Normal compressibility and flow on color Doppler imaging. Profunda Femoral Vein: No evidence of thrombus. Normal compressibility and flow on color Doppler imaging. Femoral Vein: No evidence of thrombus. Normal compressibility, respiratory phasicity and response to augmentation. Popliteal Vein: No evidence of thrombus. Normal compressibility, respiratory phasicity and response to augmentation. Calf Veins: No evidence of thrombus. Normal compressibility and flow on color Doppler imaging. Venous Reflux:  None. Other Findings:  None. LEFT LOWER EXTREMITY Common Femoral Vein: No evidence of thrombus. Normal compressibility, respiratory phasicity and response to augmentation. Saphenofemoral Junction: No evidence of thrombus. Normal compressibility and flow on color Doppler imaging. Profunda Femoral Vein: No evidence of thrombus. Normal compressibility and flow on color Doppler imaging. Femoral Vein: No evidence of thrombus. Normal compressibility, respiratory phasicity and response to augmentation. Popliteal Vein: No evidence of thrombus. Normal compressibility, respiratory phasicity and response to augmentation. Calf Veins: No evidence of thrombus. Normal compressibility and flow on color Doppler imaging. Venous Reflux:  None. Other Findings:  None. IMPRESSION: No evidence of deep venous thrombosis. Electronically Signed   By: Jasmine PangKim  Fujinaga M.D.   On: 09/28/2016 18:11    Assessment: 36 y.o. female presenting with complaints of left sided weakness and speech change.  Patient with different complaints today.  She is a poor historian.  Has vascular risk factors.  On no antiplatelet therapy.   Head CT reviewed and shows no acute changes.  Would investigate further.  Patient without report of incontinence.    Stroke Risk  Factors - diabetes mellitus and hypertension  Plan: 1. Frequent neuro checks 2. MRI of the brain without contrast. Would initiate stroke work up if imaging diagnostic of an acute ischemic event.   3. PT consult, OT consult, Speech consult 4. Prophylactic therapy-Continue ASA 5. NPO until RN stroke swallow screen 6. EEG 7. Telemetry monitoring    Thana FarrLeslie Jasmene Goswami, MD Neurology [phone] 09/29/2016, 12:56 PM

## 2016-09-29 NOTE — Progress Notes (Signed)
Physical Therapy Treatment Patient Details Name: Megan Manning MRN: [id number] DOB: 02/29/1981 Today's Date: 09/29/2016    History of Present Illness 36 yo autistic female with pleural pain was admitted, has R LL partial collapse and was noted to have weak L side but improved.  DVT was cleared but continues to have BLE pain.  PMHx:  DM, THN, thyroid disease    PT Comments    Pt is up to walk with pain on R side of chest being her single most limiting factor.  PT is planning to follow acutely and will have HHPT see her for follow up of strengthening and gait/balance safety.  Her abilities are fairly good but her  Safety awareness will lend itself to a fall.  Pt is autistic and it is not clear just how much her current presentation is baseline and how much related to medical condition.  Follow Up Recommendations  Home health PT;Supervision for mobility/OOB     Equipment Recommendations  None recommended by PT    Recommendations for Other Services       Precautions / Restrictions Precautions Precautions: Fall (telemetry) Restrictions Weight Bearing Restrictions: No    Mobility  Bed Mobility Overal bed mobility: Needs Assistance Bed Mobility: Supine to Sit;Sit to Supine     Supine to sit: Supervision;Min guard Sit to supine: Supervision;Min guard   General bed mobility comments: did cue pt to move legs closer to middle of bed upon return to avoid being too close to edge  Transfers Overall transfer level: Needs assistance Equipment used: 1 person hand held assist Transfers: Sit to/from Stand;Stand Pivot Transfers Sit to Stand: Min guard;Min assist Stand pivot transfers: Min guard (cues for safety and sequence)          Ambulation/Gait Ambulation/Gait assistance: Min assist;Min guard Ambulation Distance (Feet): 100 Feet Assistive device: Rolling walker (2 wheeled);1 person hand held assist Gait Pattern/deviations: Step-through pattern;Decreased stride length;Wide base  of support;Trunk flexed Gait velocity: decreased Gait velocity interpretation: Below normal speed for age/gender (controlled pace due to onset of chest pain) General Gait Details: pt is walking with variable pattern, with touch support to L hand and cues for all mobility sequencing needed   Stairs Stairs:  (deferred)          Wheelchair Mobility    Modified Rankin (Stroke Patients Only)       Balance Overall balance assessment: Needs assistance Sitting-balance support: Feet supported Sitting balance-Leahy Scale: Fair   Postural control: Posterior lean Standing balance support: Bilateral upper extremity supported Standing balance-Leahy Scale: Fair Standing balance comment: pt sitting on side of bed with controlled balance using legs but not aware of needing help                     Cognition Arousal/Alertness: Awake/alert Behavior During Therapy: Flat affect Overall Cognitive Status: Impaired/Different from baseline Area of Impairment: Safety/judgement;Following commands;Memory;Awareness;Problem solving     Memory: Decreased recall of precautions;Decreased short-term memory Following Commands: Follows one step commands with increased time Safety/Judgement: Decreased awareness of safety;Decreased awareness of deficits Awareness: Intellectual Problem Solving: Slow processing;Decreased initiation;Difficulty sequencing;Requires verbal cues;Requires tactile cues      Exercises      General Comments        Pertinent Vitals/Pain Pain Assessment: 0-10 Pain Score: 9  Pain Location: B legs but not localized, names her knees and feet as a problem when moving for mm testing Pain Descriptors / Indicators: Sore (deep pain) Pain Intervention(s): Monitored during session;Repositioned;Limited activity within  patient's tolerance    Home Living Family/patient expects to be discharged to:: Private residence Living Arrangements: Other relatives Available Help at  Discharge: Family;Available 24 hours/day Type of Home: Apartment Home Access: Stairs to enter Entrance Stairs-Rails: Right Home Layout: One level Home Equipment: None Additional Comments: pt is using contact support on the L hand to walk and limited by chest pain but not LE's    Prior Function Level of Independence: Independent      Comments: previously walked wiht no AD   PT Goals (current goals can now be found in the care plan section) Acute Rehab PT Goals Patient Stated Goal: none stated PT Goal Formulation: With patient Time For Goal Achievement: 10/13/16 Potential to Achieve Goals: Good    Frequency    Min 2X/week      PT Plan      Co-evaluation             End of Session Equipment Utilized During Treatment: Gait belt Activity Tolerance: Patient tolerated treatment well;Patient limited by pain (chest pain) Patient left: in bed;with call bell/phone within reach;with bed alarm set;with family/visitor present Nurse Communication: Mobility status PT Visit Diagnosis: Unsteadiness on feet (R26.81)     Time: 1610-9604 PT Time Calculation (min) (ACUTE ONLY): 33 min  Charges:  $Gait Training: 8-22 mins                    G Codes:  Functional Assessment Tool Used: AM-PAC 6 Clicks Basic Mobility;Clinical judgement Functional Limitation: Mobility: Walking and moving around Mobility: Walking and Moving Around Current Status ([id number]): At least 20 percent but less than 40 percent impaired, limited or restricted Mobility: Walking and Moving Around Goal Status [phone]): At least 1 percent but less than 20 percent impaired, limited or restricted    Ivar Drape 09/29/2016, 1:32 PM   Samul Dada, PT MS Acute Rehab Dept. Number: [facility] [id number] and [facility] [phone]

## 2016-09-30 ENCOUNTER — Observation Stay: Payer: Medicaid Other

## 2016-09-30 LAB — GLUCOSE, CAPILLARY
GLUCOSE-CAPILLARY: 90 mg/dL (ref 65–99)
GLUCOSE-CAPILLARY: 151 mg/dL — AB (ref 65–99)

## 2016-09-30 LAB — HEMOGLOBIN A1C
Hgb A1c MFr Bld: 5 % (ref 4.8–5.6)
MEAN PLASMA GLUCOSE: 97 mg/dL

## 2016-09-30 LAB — HIV ANTIBODY (ROUTINE TESTING W REFLEX): HIV SCREEN 4TH GENERATION: NONREACTIVE

## 2016-09-30 MED ORDER — BUDESONIDE 0.5 MG/2ML IN SUSP: 0.5000 mg | Freq: Two times a day (BID) | RESPIRATORY_TRACT | 1 refills | Status: DC

## 2016-09-30 NOTE — Discharge Summary (Addendum)
[facility] - [place] [facility]   PATIENT NAME: Megan Manning    MR#:  [id number]  DATE OF BIRTH:  August 30, 1980  DATE OF ADMISSION:  09/28/2016 ADMITTING PHYSICIAN: Milagros Loll, MD  DATE OF DISCHARGE: 09/30/2016  PRIMARY CARE PHYSICIAN: [facility]    ADMISSION DIAGNOSIS:  Pleuritic chest pain [R07.81] Facial droop [R29.810] Collapse of right lung [J98.11] Transient cerebral ischemia, unspecified type [G45.9]  DISCHARGE DIAGNOSIS:  Active Problems: Left side weakness     Collapse of right lung   Pleuritic chest pain   SECONDARY DIAGNOSIS:   Past Medical History:  Diagnosis Date  . Diabetes mellitus without complication (HCC)   . Hypertension   . Thyroid disease     HOSPITAL COURSE:   36 year old female with autism who presents with chest pain and left facial droop and left arm and leg weakness  1. Left facial droop and left extremity weakness:  This is not Felt to be due to TIA or CVA. Her symptoms have been resolved prior to admission She was evaluated by neurology as well.  2. Lower extremity edema (chronic):Venous Dopplers were negative for DVT  TSH 3.17 Echocardiogram showed normal EF and no mention of LVH.  3. Diabetes: She may continue outpatient medications  4. Chest pain with chronic right-sided elevated hemidiaphragm: Patient was evaluated by pulmonary consultant. She will Continue incentive spirometry and pulmonary toileting.   5. Autism with mood disorder: Continue Haldol and clozapine  6. Hypothyroid on Synthroid  7. Essential hypertension: Continue metoprolol and lisinopril   DISCHARGE CONDITIONS AND DIET:   Stable  Regular diet  CONSULTS OBTAINED:  Treatment Team:  Thana Farr, MD  DRUG ALLERGIES:  No Known Allergies  DISCHARGE MEDICATIONS:   Current Discharge Medication List    START taking these medications   Details  budesonide (PULMICORT) 0.5 MG/2ML nebulizer solution Take 2 mLs  (0.5 mg total) by nebulization 2 (two) times daily. Qty: 120 mL, Refills: 1      CONTINUE these medications which have NOT CHANGED   Details  clozapine (CLOZARIL) 50 MG tablet Take 150 mg by mouth daily.    docusate sodium (COLACE) 100 MG capsule Take 100 mg by mouth 2 (two) times daily as needed for mild constipation.    haloperidol (HALDOL) 5 MG tablet Take 5 mg by mouth daily.    levothyroxine (SYNTHROID, LEVOTHROID) 88 MCG tablet Take 88 mcg by mouth daily.    loratadine (CLARITIN) 10 MG tablet Take 10 mg by mouth daily.    magnesium oxide (MAG-OX) 400 MG tablet Take 400 mg by mouth daily.    metFORMIN (GLUCOPHAGE) 500 MG tablet Take 500 mg by mouth daily.    ondansetron (ZOFRAN ODT) 4 MG disintegrating tablet Take 1 tablet (4 mg total) by mouth every 8 (eight) hours as needed for nausea or vomiting. Qty: 20 tablet, Refills: 0    polyethylene glycol (MIRALAX / GLYCOLAX) packet Take 17 g by mouth daily. Qty: 14 each, Refills: 0    ranitidine (ZANTAC) 150 MG tablet Take 150 mg by mouth 2 (two) times daily.          Today   CHIEF COMPLAINT:  Patient doing    VITAL SIGNS:  Blood pressure 133/73, pulse 92, temperature 98 F (36.7 C), temperature source Oral, resp. rate 18, height 5\' 3"  (1.6 m), weight 98.4 kg (216 lb 14.4 oz), SpO2 95 %.   REVIEW OF SYSTEMS:  Review of Systems  Constitutional: Negative.  Negative for chills,  fever and malaise/fatigue.  HENT: Negative.  Negative for ear discharge, ear pain, hearing loss, nosebleeds and sore throat.   Eyes: Negative.  Negative for blurred vision and pain.  Respiratory: Negative.  Negative for cough, hemoptysis, shortness of breath and wheezing.   Cardiovascular: Positive for leg swelling. Negative for chest pain and palpitations.  Gastrointestinal: Negative.  Negative for abdominal pain, blood in stool, diarrhea, nausea and vomiting.  Genitourinary: Negative.  Negative for dysuria.  Musculoskeletal: Negative.   Negative for back pain.  Skin: Negative.   Neurological: Negative for dizziness, tremors, speech change, focal weakness, seizures and headaches.  Endo/Heme/Allergies: Negative.  Does not bruise/bleed easily.  Psychiatric/Behavioral: Negative.  Negative for depression, hallucinations and suicidal ideas.     PHYSICAL EXAMINATION:  GENERAL:  36 y.o.-year-old patient lying in the bed with no acute distress.  NECK:  Supple, no jugular venous distention. No thyroid enlargement, no tenderness.  LUNGS: Normal breath sounds bilaterally, no wheezing, rales,rhonchi  No use of accessory muscles of respiration.  CARDIOVASCULAR: S1, S2 normal. No murmurs, rubs, or gallops.  ABDOMEN: Soft, non-tender, non-distended. Bowel sounds present. No organomegaly or mass.  EXTREMITIES: 1+ pedal edema, NO cyanosis, or clubbing.  PSYCHIATRIC: The patient is alert and oriented x 3.  SKIN: No obvious rash, lesion, or ulcer.   DATA REVIEW:   CBC  Recent Labs Lab 09/28/16 2240  WBC 5.3  HGB 11.1*  HCT 33.2*  PLT 240    Chemistries   Recent Labs Lab 09/28/16 1513  NA 143  K 3.6  CL 114*  CO2 24  GLUCOSE 108*  BUN 12  CREATININE 0.61  CALCIUM 8.5*    Cardiac Enzymes  Recent Labs Lab 09/28/16 1908  TROPONINI <0.03    Microbiology Results  @MICRORSLT48 @  RADIOLOGY:  Ct Head Wo Contrast  Result Date: 09/28/2016 CLINICAL DATA:  Left-sided weakness for more than 2 hours. EXAM: CT HEAD WITHOUT CONTRAST TECHNIQUE: Contiguous axial images were obtained from the base of the skull through the vertex without intravenous contrast. COMPARISON:  08/07/2009 FINDINGS: Brain: No evidence for acute hemorrhage, mass lesion, midline shift, hydrocephalus or large infarct. Vascular: No hyperdense vessel or unexpected calcification. Skull: Normal. Negative for fracture or focal lesion. Sinuses/Orbits: Minimal disease in the visualized sphenoid sinuses. Other: None. IMPRESSION: Negative head CT. Electronically  Signed   By: Richarda Overlie M.D.   On: 09/28/2016 13:08   Ct Angio Chest Pe W Or Wo Contrast  Result Date: 09/28/2016 CLINICAL DATA:  Left-sided chest pain.  Shortness of breath. EXAM: CT ANGIOGRAPHY CHEST WITH CONTRAST TECHNIQUE: Multidetector CT imaging of the chest was performed using the standard protocol during bolus administration of intravenous contrast. Multiplanar CT image reconstructions and MIPs were obtained to evaluate the vascular anatomy. CONTRAST:  75 mL Isovue 370 COMPARISON:  Two-view chest x-ray 08/05/2009 FINDINGS: Cardiovascular: The heart is mildly enlarged. There is no significant pericardial effusion. Pulmonary arterial opacification is satisfactory. There are no focal filling defects to suggest pulmonary embolus. The study is mildly degraded by breathing motion. The the aorta and great vessel origins are within normal limits. Mediastinum/Nodes: No significant mediastinal or axillary adenopathy is present. Lungs/Pleura: Partial collapse of the right lower lobe with volume loss associated with elevation of the right hemidiaphragm no other focal nodule, mass, or airspace disease is present. There is significant pleural effusion. Upper Abdomen: The right hemidiaphragm is elevated. The liver is unremarkable. Limited imaging of the upper abdomen is otherwise unremarkable. Musculoskeletal: Vertebral body heights alignment are maintained. No  focal lytic or blastic lesions are present. The ribs are intact. Review of the MIP images confirms the above findings. IMPRESSION: 1. No pulmonary embolus. 2. Volume loss and partial collapse in the right lower lobe associated with elevation of the right hemidiaphragm. There is no central obstructing lesion or etiology of the elevated hemidiaphragm evident. Electronically Signed   By: Marin Robertshristopher  Mattern M.D.   On: 09/28/2016 17:41   Mr Brain Wo Contrast  Result Date: 09/30/2016 CLINICAL DATA:  36 year old female with left facial droop and left extremity  weakness. Initial encounter. EXAM: MRI HEAD WITHOUT CONTRAST TECHNIQUE: Multiplanar, multiecho pulse sequences of the brain and surrounding structures were obtained without intravenous contrast. COMPARISON:  Head CT without contrast 09/28/2016 and earlier. FINDINGS: Brain: Partially empty sella appearance. Cerebral volume is within normal limits. No restricted diffusion to suggest acute infarction. No midline shift, mass effect, evidence of mass lesion, ventriculomegaly, extra-axial collection or acute intracranial hemorrhage. Cervicomedullary junction within normal limits. Wallace CullensGray and white matter signal throughout the brain is within normal limits. No encephalomalacia or chronic cerebral blood products identified. Vascular: Major intracranial vascular flow voids are preserved and appear normal. Skull and upper cervical spine: Negative visualized cervical spine. Decreased T1 bone marrow signal in the calvarium appears related to hyperostosis. More normal bone marrow signal at the skullbase. Sinuses/Orbits: Orbits soft tissues appear normal. Visualized paranasal sinuses and mastoids are stable and well pneumatized. Other: Negative scalp soft tissues. IMPRESSION: No acute intracranial abnormality. Negative noncontrast MRI appearance of the brain. Electronically Signed   By: Odessa FlemingH  Hall M.D.   On: 09/30/2016 09:47   [place] Carotid Bilateral  Result Date: 09/29/2016 CLINICAL DATA:  36 year old female with a 1 day history of facial droop EXAM: BILATERAL CAROTID DUPLEX ULTRASOUND TECHNIQUE: Wallace CullensGray scale imaging, color Doppler and duplex ultrasound were performed of bilateral carotid and vertebral arteries in the neck. COMPARISON:  Head CT 09/28/2016 FINDINGS: Criteria: Quantification of carotid stenosis is based on velocity parameters that correlate the residual internal carotid diameter with NASCET-based stenosis levels, using the diameter of the distal internal carotid lumen as the denominator for stenosis measurement. The  following velocity measurements were obtained: RIGHT ICA:  127/30 cm/sec CCA:  111/30 cm/sec SYSTOLIC ICA/CCA RATIO:  1.2 DIASTOLIC ICA/CCA RATIO:  1.0 ECA:  85 cm/sec LEFT ICA:  106/31 cm/sec CCA:  129/25 cm/sec SYSTOLIC ICA/CCA RATIO:  0.8 DIASTOLIC ICA/CCA RATIO:  1.2 ECA:  55 cm/sec RIGHT CAROTID ARTERY: No significant atherosclerotic plaque or evidence of stenosis. RIGHT VERTEBRAL ARTERY:  Patent with normal antegrade flow. LEFT CAROTID ARTERY: No significant atherosclerotic plaque or evidence of stenosis. LEFT VERTEBRAL ARTERY:  Patent with normal antegrade flow. IMPRESSION: Negative bilateral carotid duplex ultrasound. Signed, Sterling BigHeath K. McCullough, MD [facility] Electronically Signed   By: Malachy MoanHeath  McCullough M.D.   On: 09/29/2016 15:51   [place] Venous Img Lower Bilateral  Result Date: 09/28/2016 CLINICAL DATA:  Bilateral lower extremity pain and edema EXAM: BILATERAL LOWER EXTREMITY VENOUS DOPPLER ULTRASOUND TECHNIQUE: Gray-scale sonography with graded compression, as well as color Doppler and duplex ultrasound were performed to evaluate the lower extremity deep venous systems from the level of the common femoral vein and including the common femoral, femoral, profunda femoral, popliteal and calf veins including the posterior tibial, peroneal and gastrocnemius veins when visible. The superficial great saphenous vein was also interrogated. Spectral Doppler was utilized to evaluate flow at rest and with distal augmentation maneuvers in the common femoral, femoral and popliteal veins. COMPARISON:  CTA chest 09/28/2016 FINDINGS: RIGHT LOWER EXTREMITY Common Femoral Vein: No evidence of thrombus. Normal compressibility, respiratory phasicity and response to augmentation. Saphenofemoral Junction: No evidence of thrombus. Normal compressibility and flow on color Doppler imaging. Profunda Femoral Vein: No evidence of thrombus. Normal compressibility and flow on  color Doppler imaging. Femoral Vein: No evidence of thrombus. Normal compressibility, respiratory phasicity and response to augmentation. Popliteal Vein: No evidence of thrombus. Normal compressibility, respiratory phasicity and response to augmentation. Calf Veins: No evidence of thrombus. Normal compressibility and flow on color Doppler imaging. Venous Reflux:  None. Other Findings:  None. LEFT LOWER EXTREMITY Common Femoral Vein: No evidence of thrombus. Normal compressibility, respiratory phasicity and response to augmentation. Saphenofemoral Junction: No evidence of thrombus. Normal compressibility and flow on color Doppler imaging. Profunda Femoral Vein: No evidence of thrombus. Normal compressibility and flow on color Doppler imaging. Femoral Vein: No evidence of thrombus. Normal compressibility, respiratory phasicity and response to augmentation. Popliteal Vein: No evidence of thrombus. Normal compressibility, respiratory phasicity and response to augmentation. Calf Veins: No evidence of thrombus. Normal compressibility and flow on color Doppler imaging. Venous Reflux:  None. Other Findings:  None. IMPRESSION: No evidence of deep venous thrombosis. Electronically Signed   By: Jasmine Pang M.D.   On: 09/28/2016 18:11      Current Discharge Medication List    START taking these medications   Details  budesonide (PULMICORT) 0.5 MG/2ML nebulizer solution Take 2 mLs (0.5 mg total) by nebulization 2 (two) times daily. Qty: 120 mL, Refills: 1      CONTINUE these medications which have NOT CHANGED   Details  clozapine (CLOZARIL) 50 MG tablet Take 150 mg by mouth daily.    docusate sodium (COLACE) 100 MG capsule Take 100 mg by mouth 2 (two) times daily as needed for mild constipation.    haloperidol (HALDOL) 5 MG tablet Take 5 mg by mouth daily.    levothyroxine (SYNTHROID, LEVOTHROID) 88 MCG tablet Take 88 mcg by mouth daily.    loratadine (CLARITIN) 10 MG tablet Take 10 mg by mouth daily.     magnesium oxide (MAG-OX) 400 MG tablet Take 400 mg by mouth daily.    metFORMIN (GLUCOPHAGE) 500 MG tablet Take 500 mg by mouth daily.    ondansetron (ZOFRAN ODT) 4 MG disintegrating tablet Take 1 tablet (4 mg total) by mouth every 8 (eight) hours as needed for nausea or vomiting. Qty: 20 tablet, Refills: 0    polyethylene glycol (MIRALAX / GLYCOLAX) packet Take 17 g by mouth daily. Qty: 14 each, Refills: 0    ranitidine (ZANTAC) 150 MG tablet Take 150 mg by mouth 2 (two) times daily.         Management plans discussed with the patient and sister and they are in agreement. Stable for discharge home  Patient should follow up with pcp  CODE STATUS:     Code Status Orders        Start     Ordered   09/28/16 2035  Full code  Continuous     09/28/16 2035    Code Status History    Date Active Date Inactive Code Status Order ID Comments User Context   This patient has a current code status but no historical code status.      TOTAL TIME TAKING CARE OF THIS PATIENT: 37 minutes.    Note: This dictation was prepared with Dragon dictation along with smaller phrase technology. Any transcriptional errors that result from this process are unintentional.  Adriauna Campton M.D on 09/30/2016 at 10:55 AM  Between 7am to 6pm - Pager - [phone] After 6pm go to [email or web address] - Social research officer, government  [facility]  Office  [phone]  CC: Primary care physician; [facility]

## 2016-09-30 NOTE — Progress Notes (Signed)
Received MD order to discharge patient to home, reviewed discharge instructions, prescriptions and recommended follow up appointment with patient and patient verbalized understanding, discharged to home with sister in wheelchair by nursing staff

## 2016-09-30 NOTE — Care Management (Addendum)
Admitted with diagnosis if CVA under observation status. Lives with sister Carney BernShanika Bursch [phone]). Sister works at Nursing home and is in the home 18/24. [facility]. Unsure as to when she was there the last time. No equipment in the home. No services in the past.  Physical therapy evaluation completed. Recommending Home Health and Physical therapy ; supervision for mobility. [name]'s insurance doesn't pay for therapy services in the home. Gave information on [facility].  Discharge to home today per Dr. Juliene PinaMody. Sister will transport. Nebulizer will be provided by [facility].  Gwenette GreetBrenda S Nikki Glanzer RN MSN CCM Care Management

## 2016-09-30 NOTE — Progress Notes (Signed)
[employer] - [place] at [facility]   PATIENT NAME: Megan Manning    MR#:  [id number]  DATE OF BIRTH:  21-May-1981  SUBJECTIVE:  Having chest pain  REVIEW OF SYSTEMS:    Review of Systems  Constitutional: Negative.  Negative for chills, fever and malaise/fatigue.  HENT: Negative.  Negative for ear discharge, ear pain, hearing loss, nosebleeds and sore throat.   Eyes: Negative.  Negative for blurred vision and pain.  Respiratory: Negative.  Negative for cough, hemoptysis, shortness of breath and wheezing.   Cardiovascular: Positive for chest pain and leg swelling. Negative for palpitations.  Gastrointestinal: Negative.  Negative for abdominal pain, blood in stool, diarrhea, nausea and vomiting.  Genitourinary: Negative.  Negative for dysuria.  Musculoskeletal: Negative.  Negative for back pain.  Skin: Negative.   Neurological: Negative for dizziness, tremors, speech change, focal weakness, seizures and headaches.  Endo/Heme/Allergies: Negative.  Does not bruise/bleed easily.  Psychiatric/Behavioral: Negative.  Negative for depression, hallucinations and suicidal ideas.    Tolerating Diet:yes      DRUG ALLERGIES:  No Known Allergies  VITALS:  Blood pressure (!) 105/46, pulse 90, temperature 98 F (36.7 C), temperature source Oral, resp. rate 18, height 5\' 3"  (1.6 m), weight 98.4 kg (216 lb 14.4 oz), SpO2 96 %.  PHYSICAL EXAMINATION:   Physical Exam  Constitutional: She is oriented to person, place, and time and well-developed, well-nourished, and in no distress. No distress.  HENT:  Head: Normocephalic.  Eyes: No scleral icterus.  Neck: Normal range of motion. Neck supple. No JVD present. No tracheal deviation present.  Cardiovascular: Normal rate, regular rhythm and normal heart sounds.  Exam reveals no gallop and no friction rub.   No murmur heard. Pulmonary/Chest: Effort normal and breath sounds normal. No respiratory distress. She has no wheezes. She  has no rales. She exhibits no tenderness.  Decreased right base  Abdominal: Soft. Bowel sounds are normal. She exhibits no distension and no mass. There is no tenderness. There is no rebound and no guarding.  Musculoskeletal: Normal range of motion. She exhibits no edema.  Neurological: She is alert and oriented to person, place, and time.  Skin: Skin is warm. No rash noted. No erythema.  Psychiatric: Affect and judgment normal.      LABORATORY PANEL:   CBC  Recent Labs Lab 09/28/16 2240  WBC 5.3  HGB 11.1*  HCT 33.2*  PLT 240   ------------------------------------------------------------------------------------------------------------------  Chemistries   Recent Labs Lab 09/28/16 1513  NA 143  K 3.6  CL 114*  CO2 24  GLUCOSE 108*  BUN 12  CREATININE 0.61  CALCIUM 8.5*   ------------------------------------------------------------------------------------------------------------------  Cardiac Enzymes  Recent Labs Lab 09/28/16 1908  TROPONINI <0.03   ------------------------------------------------------------------------------------------------------------------  RADIOLOGY:  Ct Head Wo Contrast  Result Date: 09/28/2016 CLINICAL DATA:  Left-sided weakness for more than 2 hours. EXAM: CT HEAD WITHOUT CONTRAST TECHNIQUE: Contiguous axial images were obtained from the base of the skull through the vertex without intravenous contrast. COMPARISON:  08/07/2009 FINDINGS: Brain: No evidence for acute hemorrhage, mass lesion, midline shift, hydrocephalus or large infarct. Vascular: No hyperdense vessel or unexpected calcification. Skull: Normal. Negative for fracture or focal lesion. Sinuses/Orbits: Minimal disease in the visualized sphenoid sinuses. Other: None. IMPRESSION: Negative head CT. Electronically Signed   By: Richarda Overlie M.D.   On: 09/28/2016 13:08   Ct Angio Chest Pe W Or Wo Contrast  Result Date: 09/28/2016 CLINICAL DATA:  Left-sided chest pain.  Shortness of  breath. EXAM:  CT ANGIOGRAPHY CHEST WITH CONTRAST TECHNIQUE: Multidetector CT imaging of the chest was performed using the standard protocol during bolus administration of intravenous contrast. Multiplanar CT image reconstructions and MIPs were obtained to evaluate the vascular anatomy. CONTRAST:  75 mL Isovue 370 COMPARISON:  Two-view chest x-ray 08/05/2009 FINDINGS: Cardiovascular: The heart is mildly enlarged. There is no significant pericardial effusion. Pulmonary arterial opacification is satisfactory. There are no focal filling defects to suggest pulmonary embolus. The study is mildly degraded by breathing motion. The the aorta and great vessel origins are within normal limits. Mediastinum/Nodes: No significant mediastinal or axillary adenopathy is present. Lungs/Pleura: Partial collapse of the right lower lobe with volume loss associated with elevation of the right hemidiaphragm no other focal nodule, mass, or airspace disease is present. There is significant pleural effusion. Upper Abdomen: The right hemidiaphragm is elevated. The liver is unremarkable. Limited imaging of the upper abdomen is otherwise unremarkable. Musculoskeletal: Vertebral body heights alignment are maintained. No focal lytic or blastic lesions are present. The ribs are intact. Review of the MIP images confirms the above findings. IMPRESSION: 1. No pulmonary embolus. 2. Volume loss and partial collapse in the right lower lobe associated with elevation of the right hemidiaphragm. There is no central obstructing lesion or etiology of the elevated hemidiaphragm evident. Electronically Signed   By: Marin Roberts M.D.   On: 09/28/2016 17:41   US Venous Img Lower Bilateral  Result Date: 09/28/2016 CLINICAL DATA:  Bilateral lower extremity pain and edema EXAM: BILATERAL LOWER EXTREMITY VENOUS DOPPLER ULTRASOUND TECHNIQUE: Gray-scale sonography with graded compression, as well as color Doppler and duplex ultrasound were performed to  evaluate the lower extremity deep venous systems from the level of the common femoral vein and including the common femoral, femoral, profunda femoral, popliteal and calf veins including the posterior tibial, peroneal and gastrocnemius veins when visible. The superficial great saphenous vein was also interrogated. Spectral Doppler was utilized to evaluate flow at rest and with distal augmentation maneuvers in the common femoral, femoral and popliteal veins. COMPARISON:  CTA chest 09/28/2016 FINDINGS: RIGHT LOWER EXTREMITY Common Femoral Vein: No evidence of thrombus. Normal compressibility, respiratory phasicity and response to augmentation. Saphenofemoral Junction: No evidence of thrombus. Normal compressibility and flow on color Doppler imaging. Profunda Femoral Vein: No evidence of thrombus. Normal compressibility and flow on color Doppler imaging. Femoral Vein: No evidence of thrombus. Normal compressibility, respiratory phasicity and response to augmentation. Popliteal Vein: No evidence of thrombus. Normal compressibility, respiratory phasicity and response to augmentation. Calf Veins: No evidence of thrombus. Normal compressibility and flow on color Doppler imaging. Venous Reflux:  None. Other Findings:  None. LEFT LOWER EXTREMITY Common Femoral Vein: No evidence of thrombus. Normal compressibility, respiratory phasicity and response to augmentation. Saphenofemoral Junction: No evidence of thrombus. Normal compressibility and flow on color Doppler imaging. Profunda Femoral Vein: No evidence of thrombus. Normal compressibility and flow on color Doppler imaging. Femoral Vein: No evidence of thrombus. Normal compressibility, respiratory phasicity and response to augmentation. Popliteal Vein: No evidence of thrombus. Normal compressibility, respiratory phasicity and response to augmentation. Calf Veins: No evidence of thrombus. Normal compressibility and flow on color Doppler imaging. Venous Reflux:  None. Other  Findings:  None. IMPRESSION: No evidence of deep venous thrombosis. Electronically Signed   By: Jasmine Pang M.D.   On: 09/28/2016 18:11     ASSESSMENT AND PLAN:   36 year old female with autism who presents with chest pain and left facial droop and left arm and leg weakness  1.  Left facial droop and left extremity weakness:  Follow up on MRI, if this shows CVA then will order TEE ECHO shows normal EF Poorly visualized aortic valve with moderate aortic   regurgitation. Endocarditis or other valvular lesion cannot be   excluded. TEE could be helpful for further evaluation, as   clinically indicated.  Follow up on EEG Continue aspirin Aprreciate neurology and PT consult recommending home with home health LDL 79  2. Lower extremity edema (chronic):Venous Dopplers were negative for DVT  TSH 3.17 Albumin slightly low at 3.4 Echocardiogram as above  3. Diabetes: Continue sliding scale insulin  4. Chest pain with chronic right-sided elevated hemidiaphragm: Patient evaluated by pulmonary consultant. Continue incentive spirometry and pulmonary toileting. Continue pulmicort  5. Autism with mood disorder: Continue Haldol and clozapine  6. Hypothyroid on Synthroid  7. Essential hypertension: Continue metoprolol and lisinopril  Management plans discussed with the patient and she is in agreement.  CODE STATUS: full  TOTAL TIME TAKING CARE OF THIS PATIENT: 27 minutes.     POSSIBLE D/C today/tomorrow, DEPENDING ON MRI  Patirica Longshore M.D on 09/30/2016 at 8:35 AM  Between 7am to 6pm - Pager - [phone] After 6pm go to [email or web address] - Social research officer, [email or web address] [email or web address]  [facility]  Office  [phone]  CC: Primary care physician; [facility]  Note: This dictation was prepared with Dragon dictation along with smaller phrase technology. Any transcriptional errors that result from this process are unintentional.

## 2016-10-27 ENCOUNTER — Emergency Department
Admission: EM | Admit: 2016-10-27 | Discharge: 2016-10-27 | Disposition: A | Discharge status: Home / Self Care | Payer: Medicaid Other | Attending: Emergency Medicine | Admitting: Emergency Medicine

## 2016-10-27 ENCOUNTER — Encounter: Payer: Self-pay | Admitting: Emergency Medicine

## 2016-10-27 DIAGNOSIS — Z7984 Long term (current) use of oral hypoglycemic drugs: Secondary | ICD-10-CM | POA: Diagnosis not present

## 2016-10-27 DIAGNOSIS — Z79899 Other long term (current) drug therapy: Secondary | ICD-10-CM | POA: Insufficient documentation

## 2016-10-27 DIAGNOSIS — R1084 Generalized abdominal pain: Secondary | ICD-10-CM | POA: Diagnosis present

## 2016-10-27 DIAGNOSIS — E119 Type 2 diabetes mellitus without complications: Secondary | ICD-10-CM | POA: Insufficient documentation

## 2016-10-27 DIAGNOSIS — I10 Essential (primary) hypertension: Secondary | ICD-10-CM | POA: Diagnosis not present

## 2016-10-27 LAB — COMPREHENSIVE METABOLIC PANEL
ALBUMIN: 4.1 g/dL (ref 3.5–5.0)
ALK PHOS: 76 U/L (ref 38–126)
ALT: 13 U/L — ABNORMAL LOW (ref 14–54)
ANION GAP: 5 (ref 5–15)
AST: 19 U/L (ref 15–41)
BILIRUBIN TOTAL: 0.3 mg/dL (ref 0.3–1.2)
BUN: 10 mg/dL (ref 6–20)
CALCIUM: 8.7 mg/dL — AB (ref 8.9–10.3)
CO2: 24 mmol/L (ref 22–32)
Chloride: 113 mmol/L — ABNORMAL HIGH (ref 101–111)
Creatinine, Ser: 0.84 mg/dL (ref 0.44–1.00)
GFR calc Af Amer: >60 mL/min (ref >60)
GFR calc non Af Amer: >60 mL/min (ref >60)
GLUCOSE: 116 mg/dL — AB (ref 65–99)
Potassium: 4 mmol/L (ref 3.5–5.1)
Sodium: 142 mmol/L (ref 135–145)
TOTAL PROTEIN: 7.3 g/dL (ref 6.5–8.1)

## 2016-10-27 LAB — CBC
HEMATOCRIT: 36.5 % (ref 35.0–47.0)
Hemoglobin: 12.2 g/dL (ref 12.0–16.0)
MCH: 28.7 pg (ref 26.0–34.0)
MCHC: 33.3 g/dL (ref 32.0–36.0)
MCV: 86 fL (ref 80.0–100.0)
Platelets: 265 10*3/uL (ref 150–440)
RBC: 4.24 MIL/uL (ref 3.80–5.20)
RDW: 14.3 % (ref 11.5–14.5)
WBC: 5.3 10*3/uL (ref 3.6–11.0)

## 2016-10-27 LAB — URINE DRUG SCREEN, QUALITATIVE (ARMC ONLY)
Amphetamines, Ur Screen: NOT DETECTED
BARBITURATES, UR SCREEN: NOT DETECTED
BENZODIAZEPINE, UR SCRN: NOT DETECTED
Cannabinoid 50 Ng, Ur ~~LOC~~: NOT DETECTED
Cocaine Metabolite,Ur ~~LOC~~: NOT DETECTED
MDMA (Ecstasy)Ur Screen: NOT DETECTED
Methadone Scn, Ur: NOT DETECTED
OPIATE, UR SCREEN: NOT DETECTED
PHENCYCLIDINE (PCP) UR S: NOT DETECTED
Tricyclic, Ur Screen: NOT DETECTED

## 2016-10-27 LAB — SALICYLATE LEVEL: Salicylate Lvl: <7 mg/dL (ref 2.8–30.0)

## 2016-10-27 LAB — ACETAMINOPHEN LEVEL: Acetaminophen (Tylenol), Serum: <10 ug/mL — ABNORMAL LOW (ref 10–30)

## 2016-10-27 LAB — ETHANOL: Alcohol, Ethyl (B): <5 mg/dL (ref <5)

## 2016-10-27 NOTE — ED Provider Notes (Signed)
[facility] Emergency Department Provider Note  Time seen: 4:56 PM  I have reviewed the triage vital signs and the nursing notes.   HISTORY  Chief Complaint Depression and Abdominal Pain    HPI Megan Manning is a 36 y.o. female with a past medical history of hypertension, diabetes, presents to the emergency department for abdominal pain and dark stool. Patient's presentation is fairly confusing she was brought in by her act team member, who reported possible suicidal ideation. Our triage nurse talked to the team member who is concerned over suicidal ideation. Patient adamantly denies any suicidal ideation or depression. States she is only here for abdominal pain and dark stool. Patient was seen by a provider in the triage room and patient expressed him absolutely no suicidal ideation or depression. Patient states she lives with her sister but makes her own medical decisions. To me she denies ever stating any suicidal ideation. Denies any depression. States she had mentioned to the team member her abdominal pain and that she did not feel well, and she believes that that was taken out of context. Here the patient is calm, cooperative, states mild diffuse abdominal pain since last night with dark stool. Denies any suicidal ideation or any depression.  Past Medical History:  Diagnosis Date  . Diabetes mellitus without complication (HCC)   . Hypertension   . Thyroid disease     Patient Active Problem List   Diagnosis Date Noted  . Facial droop   . Collapse of right lung   . Pleuritic chest pain   . Transient cerebral ischemia 09/28/2016    History reviewed. No pertinent surgical history.  Prior to Admission medications   Medication Sig Start Date End Date Taking? Authorizing Provider  budesonide (PULMICORT) 0.5 MG/2ML nebulizer solution Take 2 mLs (0.5 mg total) by nebulization 2 (two) times daily. 09/30/16 10/31/16  Adrian Saran, MD  clozapine (CLOZARIL) 50 MG  tablet Take 150 mg by mouth daily.    Historical Provider, MD  docusate sodium (COLACE) 100 MG capsule Take 100 mg by mouth 2 (two) times daily as needed for mild constipation.    Historical Provider, MD  haloperidol (HALDOL) 5 MG tablet Take 5 mg by mouth daily.    Historical Provider, MD  levothyroxine (SYNTHROID, LEVOTHROID) 88 MCG tablet Take 88 mcg by mouth daily.    Historical Provider, MD  loratadine (CLARITIN) 10 MG tablet Take 10 mg by mouth daily.    Historical Provider, MD  magnesium oxide (MAG-OX) 400 MG tablet Take 400 mg by mouth daily.    Historical Provider, MD  metFORMIN (GLUCOPHAGE) 500 MG tablet Take 500 mg by mouth daily.    Historical Provider, MD  ondansetron (ZOFRAN ODT) 4 MG disintegrating tablet Take 1 tablet (4 mg total) by mouth every 8 (eight) hours as needed for nausea or vomiting. 07/24/16   Emily Filbert, MD  polyethylene glycol (MIRALAX / Ethelene Hal) packet Take 17 g by mouth daily. 07/24/16   Emily Filbert, MD  ranitidine (ZANTAC) 150 MG tablet Take 150 mg by mouth 2 (two) times daily.    Historical Provider, MD    No Known Allergies  Family History  Problem Relation Age of Onset  . CVA Neg Hx     Social History Social History  Substance Use Topics  . Smoking status: Never Smoker  . Smokeless tobacco: Never Used  . Alcohol use No    Review of Systems Constitutional: Negative for fever. Cardiovascular: Negative for chest  pain. Respiratory: Negative for shortness of breath. Gastrointestinal: Mild diffuse abdominal pain. Dark stool. Neurological: Negative for headache 10-point ROS otherwise negative.  ____________________________________________   PHYSICAL EXAM:  VITAL SIGNS: ED Triage Vitals  Enc Vitals Group     BP 10/27/16 1348 (!) 165/76     Pulse Rate 10/27/16 1348 100     Resp 10/27/16 1348 16     Temp --      Temp src --      SpO2 10/27/16 1348 100 %     Weight 10/27/16 1333 216 lb (98 kg)     Height 10/27/16 1348   (1.6 m)     Head Circumference --      Peak Flow --      Pain Score 10/27/16 1348 10     Pain Loc --      Pain Edu? --      Excl. in GC? --     Constitutional: Alert and oriented. Well appearing and in no distress. Eyes: Normal exam ENT   Head: Normocephalic and atraumatic.   Mouth/Throat: Mucous membranes are moist. Cardiovascular: Normal rate, regular rhythm. No murmur Respiratory: Normal respiratory effort without tachypnea nor retractions. Breath sounds are clear  Gastrointestinal: Soft, mild diffuse tenderness. No rebound or guarding. No distention. Musculoskeletal: Nontender with normal range of motion in all extremities. Neurologic:  Normal speech and language. No gross focal neurologic deficits Skin:  Skin is warm, dry and intact.  Psychiatric: Mood and affect are normal. Calm and cooperative. Specifically denies any SI or HI. Specifically denies any depression.  ____________________________________________   INITIAL IMPRESSION / ASSESSMENT AND PLAN / ED COURSE  Pertinent labs & imaging results that were available during my care of the patient were reviewed by me and considered in my medical decision making (see chart for details).  Patient presents the emergency department with abdominal pain and dark stool since yesterday. Patient adamantly denies any suicidal statements or any depression. Denies any alcohol or drug use. Patient's alcohol screen is negative, urine drug test is negative. Patient's labs are largely within normal limits. Patient has slight abdominal tenderness in all quadrants but no focal tenderness. No rebound or guarding. Patient denies vomiting but states some nausea. Denies diarrhea, states normal stool but dark in color. Denies taking iron or Pepto-Bismol. I have talked to her triage nurse who states they act team member stated possible suicidal ideation although the patient denies any suicidal ideation. I attempted to call the crisis team however  they're unable to reach the specific act team member. As the patient is not under the influence of any substances, strongly denies ever making suicidal statements. Denies any depression. She does not meet involuntary commitment criteria. She does not want to see a psychiatrist. As far as the patient's medical workup and has been largely nonrevealing. Help perform a rectal examination to examine the patient's stool but I anticipate likely discharged home with GI follow-up.  Rectal exam shows light brown stool guaiac negative. No hemorrhoids.  ____________________________________________   FINAL CLINICAL IMPRESSION(S) / ED DIAGNOSES  Abdominal pain    Minna Antis, MD 10/27/16 1706

## 2016-10-27 NOTE — ED Notes (Signed)
After speaking with dr Mayford Knife pt states she is not depressed and only wants to be seen for her abd pain and painful bowel movements.

## 2016-10-27 NOTE — ED Triage Notes (Signed)
Pt to ed with c/o depression and suicidal thoughts today.  Pt was brought in by psychotherapeutic services.  Pt crying at triage.

## 2016-10-27 NOTE — ED Notes (Signed)
Discussed discharge instructions and follow-up care with patient. No questions or concerns at this time. Pt stable at discharge.  

## 2016-11-30 ENCOUNTER — Telehealth: Payer: Self-pay

## 2016-11-30 ENCOUNTER — Encounter: Payer: Self-pay | Admitting: Gastroenterology

## 2016-11-30 ENCOUNTER — Other Ambulatory Visit: Payer: Self-pay

## 2016-11-30 ENCOUNTER — Ambulatory Visit (INDEPENDENT_AMBULATORY_CARE_PROVIDER_SITE_OTHER): Payer: Medicaid Other | Admitting: Gastroenterology

## 2016-11-30 VITALS — BP 124/85 | HR 106 | Temp 98.2°F | Ht 66.0 in | Wt 269.4 lb

## 2016-11-30 DIAGNOSIS — K921 Melena: Secondary | ICD-10-CM

## 2016-11-30 DIAGNOSIS — K581 Irritable bowel syndrome with constipation: Secondary | ICD-10-CM

## 2016-11-30 MED ORDER — POLYETHYLENE GLYCOL 3350 17 G PO PACK: 17.0000 g | PACK | Freq: Every day | ORAL | 0 refills | Status: AC

## 2016-11-30 NOTE — Progress Notes (Signed)
Gastroenterology Consultation  [facility] Provider:     [facility] Primary Care Physician:  [facility] Primary Gastroenterologist:  Dr. Wyline MoodKiran Zed Manning  Reason for Consultation:     Abdominal pain and blood in her stool        HPI:   Megan Manning is a 36 y.o. y/o female referred for consultation & management  by Dr. Theodoro Doing[facility].    She has been referred to see me after she presented to the ER on 10/27/16 with abdominal pain . She presented initially with concerns of suicidal ideation which she denied. There was some concern for dark stools but the ER rectal exam was light brown stool and Guiac negative. Hb in the ER was 12.2 grams with a normal MCV.   Rectal bleeding :  Onset and where was blood seen  :a week prior her ER visit, occurred 2-3 times, mixed with the stool.  Frequency of bowel movements :every few days  Consistency : very hard  Change in shape of stool:no  Pain associated with bowel movements:yes  Blood thinner usage:no  NSAID's: no  Prior colonoscopy :no  Family history of colon cancer or polyps:no  Weight loss:no   Abdominal pain: Onset: week before ER visit , no better since, occurs once in few days, lasts 2-3 days,  Site :lower abdominal  Radiation: no  Nature of pain: sharp in nature  Aggravating factors: nothing  Relieving factors :bowel movement  Weight loss: no  NSAID use: no  PPI use :no  Gall bladder surgery: no  Relief with bowel movements: yes  Gas/Bloating/Abdominal distension: no   No fruits of vegetables, mostly eats macaroni and chicken.  Past Medical History:  Diagnosis Date  . Diabetes mellitus without complication (HCC)   . Hypertension   . Thyroid disease     No past surgical history on file.  Prior to Admission medications   Medication Sig Start Date End Date Taking? Authorizing Provider  budesonide (PULMICORT) 0.5 MG/2ML nebulizer solution Take 2 mLs (0.5 mg total) by nebulization 2  (two) times daily. 09/30/16 10/31/16  Adrian SaranMody, Sital, MD  clozapine (CLOZARIL) 50 MG tablet Take 150 mg by mouth daily.    [provider]  docusate sodium (COLACE) 100 MG capsule Take 100 mg by mouth 2 (two) times daily as needed for mild constipation.    [provider]  haloperidol (HALDOL) 5 MG tablet Take 5 mg by mouth daily.    [provider]  levothyroxine (SYNTHROID, LEVOTHROID) 88 MCG tablet Take 88 mcg by mouth daily.    [provider]  loratadine (CLARITIN) 10 MG tablet Take 10 mg by mouth daily.    [provider]  magnesium oxide (MAG-OX) 400 MG tablet Take 400 mg by mouth daily.    [provider]  metFORMIN (GLUCOPHAGE) 500 MG tablet Take 500 mg by mouth daily.    [provider]  ondansetron (ZOFRAN ODT) 4 MG disintegrating tablet Take 1 tablet (4 mg total) by mouth every 8 (eight) hours as needed for nausea or vomiting. 07/24/16   Emily FilbertWilliams, Jonathan E, MD  polyethylene glycol (MIRALAX / Ethelene HalGLYCOLAX) packet Take 17 g by mouth daily. 07/24/16   Emily FilbertWilliams, Jonathan E, MD  ranitidine (ZANTAC) 150 MG tablet Take 150 mg by mouth 2 (two) times daily.    [provider]    Family History  Problem Relation Age of Onset  . CVA Neg Hx      Social  History  Substance Use Topics  . Smoking status: Never Smoker  . Smokeless tobacco: Never Used  . Alcohol use No    Allergies as of 11/30/2016  . (No Known Allergies)    Review of Systems:    All systems reviewed and negative except where noted in HPI.   Physical Exam:  There were no vitals taken for this visit. No LMP recorded. Patient has had an injection. Psych:  Alert and cooperative. Normal mood and affect. General:   Alert,  Well-developed, well-nourished, pleasant and cooperative in NAD Head:  Normocephalic and atraumatic. Eyes:  Sclera clear, no icterus.   Conjunctiva pink. Ears:  Normal auditory acuity. Nose:  No deformity, discharge, or lesions. Mouth:  No  deformity or lesions,oropharynx pink & moist. Neck:  Supple; no masses or thyromegaly. Lungs:  Respirations even and unlabored.  Clear throughout to auscultation.   No wheezes, crackles, or rhonchi. No acute distress. Heart:  Regular rate and rhythm; no murmurs, clicks, rubs, or gallops. Abdomen:  Normal bowel sounds.  No bruits.  Soft, non-tender and non-distended without masses, hepatosplenomegaly or hernias noted.  No guarding or rebound tenderness.    Neurologic:  Alert and oriented x3;  grossly normal neurologically. Psych:  Alert and cooperative. Normal mood and affect.  Imaging Studies: No results found.  Assessment and Plan:   Megan Manning is a 36 y.o. y/o female has been referred for abdominal pain . She also suffers from constipation , very low dietary fiber likely the cause and associated rectal bleeding . 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  Plan   1. High fiber diet -patient information provided 2. Miralax daily - if does not help then next step is to try linzess or amitiza 3. Colonoscopy   I have discussed alternative options, risks & benefits,  which include, but are not limited to, bleeding, infection, perforation,respiratory complication & drug reaction.  The patient agrees with this plan & written consent will be obtained.    Follow up in 8-12 weeks   Dr Wyline Mood MD

## 2016-11-30 NOTE — Telephone Encounter (Signed)
Gastroenterology Pre-Procedure Review  Request Date: 5/29 Requesting Physician: Dr. Tobi BastosAnna  PATIENT REVIEW QUESTIONS: The patient responded to the following health history questions as indicated:    1. Are you having any GI issues? yes (blood in stool) 2. Do you have a personal history of Polyps? no 3. Do you have a family history of Colon Cancer or Polyps? no 4. Diabetes Mellitus? yes (type II) 5. Joint replacements in the past 12 months?no 6. Major health problems in the past 3 months?no 7. Any artificial heart valves, MVP, or defibrillator?no    MEDICATIONS & ALLERGIES:    Patient reports the following regarding taking any anticoagulation/antiplatelet therapy:   Plavix, Coumadin, Eliquis, Xarelto, Lovenox, Pradaxa, Brilinta, or Effient? no Aspirin? no  Patient confirms/reports the following medications:  Current Outpatient Prescriptions  Medication Sig Dispense Refill  . budesonide (PULMICORT) 0.5 MG/2ML nebulizer solution Take 2 mLs (0.5 mg total) by nebulization 2 (two) times daily. 120 mL 1  . clozapine (CLOZARIL) 50 MG tablet Take 150 mg by mouth daily.    Marland Kitchen. docusate sodium (COLACE) 100 MG capsule Take 100 mg by mouth 2 (two) times daily as needed for mild constipation.    . haloperidol (HALDOL) 5 MG tablet Take 5 mg by mouth daily.    Marland Kitchen. levothyroxine (SYNTHROID, LEVOTHROID) 88 MCG tablet Take 88 mcg by mouth daily.    Marland Kitchen. loratadine (CLARITIN) 10 MG tablet Take 10 mg by mouth daily.    . magnesium oxide (MAG-OX) 400 MG tablet Take 400 mg by mouth daily.    . metFORMIN (GLUCOPHAGE) 500 MG tablet Take 500 mg by mouth daily.    . metoprolol succinate (TOPROL-XL) 25 MG 24 hr tablet Take 25 mg by mouth.    . OLANZapine (ZYPREXA) 5 MG tablet Take 5 mg by mouth.    . ondansetron (ZOFRAN ODT) 4 MG disintegrating tablet Take 1 tablet (4 mg total) by mouth every 8 (eight) hours as needed for nausea or vomiting. 20 tablet 0  . polyethylene glycol (MIRALAX / GLYCOLAX) packet Take 17 g by  mouth daily. 14 each 0  . ranitidine (ZANTAC) 150 MG tablet Take 150 mg by mouth 2 (two) times daily.    . traMADol (ULTRAM) 50 MG tablet Take 50 mg by mouth.     No current facility-administered medications for this visit.     Patient confirms/reports the following allergies:  No Known Allergies  No orders of the defined types were placed in this encounter.   AUTHORIZATION INFORMATION Primary Insurance: 1D#: Group #:  Secondary Insurance: 1D#: Group #:  SCHEDULE INFORMATION: Date: 5/29 Time: Location: [facility]

## 2016-12-01 ENCOUNTER — Telehealth: Payer: Self-pay | Admitting: Gastroenterology

## 2016-12-01 NOTE — Telephone Encounter (Signed)
11/30/16 Faxed Prior Auth form [employer] for Colonoscopy [id number] / K92.1

## 2016-12-17 ENCOUNTER — Other Ambulatory Visit: Payer: Self-pay | Admitting: Ophthalmology

## 2016-12-17 DIAGNOSIS — H53453 Other localized visual field defect, bilateral: Secondary | ICD-10-CM

## 2016-12-18 ENCOUNTER — Encounter: Payer: Self-pay | Admitting: *Deleted

## 2016-12-22 ENCOUNTER — Encounter: Payer: Self-pay | Admitting: *Deleted

## 2016-12-22 ENCOUNTER — Ambulatory Visit: Payer: Medicaid Other | Admitting: Anesthesiology

## 2016-12-22 ENCOUNTER — Encounter
Admission: RE | Disposition: A | Discharge status: Home / Self Care | Payer: Self-pay | Source: Ambulatory Visit | Attending: Gastroenterology

## 2016-12-22 ENCOUNTER — Ambulatory Visit
Admission: RE | Admit: 2016-12-22 | Discharge: 2016-12-22 | Disposition: A | Discharge status: Home / Self Care | Payer: Medicaid Other | Source: Ambulatory Visit | Attending: Gastroenterology | Admitting: Gastroenterology

## 2016-12-22 DIAGNOSIS — I10 Essential (primary) hypertension: Secondary | ICD-10-CM | POA: Diagnosis not present

## 2016-12-22 DIAGNOSIS — K921 Melena: Secondary | ICD-10-CM

## 2016-12-22 DIAGNOSIS — E119 Type 2 diabetes mellitus without complications: Secondary | ICD-10-CM | POA: Diagnosis not present

## 2016-12-22 DIAGNOSIS — E669 Obesity, unspecified: Secondary | ICD-10-CM | POA: Insufficient documentation

## 2016-12-22 DIAGNOSIS — K625 Hemorrhage of anus and rectum: Secondary | ICD-10-CM | POA: Insufficient documentation

## 2016-12-22 DIAGNOSIS — E0590 Thyrotoxicosis, unspecified without thyrotoxic crisis or storm: Secondary | ICD-10-CM | POA: Insufficient documentation

## 2016-12-22 DIAGNOSIS — Z7984 Long term (current) use of oral hypoglycemic drugs: Secondary | ICD-10-CM | POA: Diagnosis not present

## 2016-12-22 DIAGNOSIS — Z79899 Other long term (current) drug therapy: Secondary | ICD-10-CM | POA: Diagnosis not present

## 2016-12-22 DIAGNOSIS — K640 First degree hemorrhoids: Secondary | ICD-10-CM

## 2016-12-22 DIAGNOSIS — Z6841 Body Mass Index (BMI) 40.0 and over, adult: Secondary | ICD-10-CM | POA: Insufficient documentation

## 2016-12-22 DIAGNOSIS — J45909 Unspecified asthma, uncomplicated: Secondary | ICD-10-CM | POA: Insufficient documentation

## 2016-12-22 HISTORY — PX: COLONOSCOPY WITH PROPOFOL: SHX5780

## 2016-12-22 HISTORY — DX: Thyrotoxicosis, unspecified without thyrotoxic crisis or storm: E05.90

## 2016-12-22 LAB — GLUCOSE, CAPILLARY: Glucose-Capillary: 77 mg/dL (ref 65–99)

## 2016-12-22 LAB — POCT PREGNANCY, URINE: Preg Test, Ur: NEGATIVE

## 2016-12-22 SURGERY — COLONOSCOPY WITH PROPOFOL
Anesthesia: General

## 2016-12-22 MED ORDER — LIDOCAINE HCL (PF) 2 % IJ SOLN
INTRAMUSCULAR | Status: DC | PRN
  Administered 2016-12-22: 50 mg via INTRADERMAL

## 2016-12-22 MED ORDER — PROPOFOL 500 MG/50ML IV EMUL
INTRAVENOUS | Status: DC | PRN
  Administered 2016-12-22: 120 ug/kg/min via INTRAVENOUS

## 2016-12-22 MED ORDER — PROPOFOL 500 MG/50ML IV EMUL
INTRAVENOUS | Status: AC
  Filled 2016-12-22: qty 50

## 2016-12-22 MED ORDER — SODIUM CHLORIDE 0.9 % IV SOLN
INTRAVENOUS | Status: DC | PRN
  Administered 2016-12-22: 09:00:00 via INTRAVENOUS

## 2016-12-22 MED ORDER — SODIUM CHLORIDE 0.9 % IV SOLN: INTRAVENOUS | Status: DC

## 2016-12-22 MED ORDER — PROPOFOL 10 MG/ML IV BOLUS
INTRAVENOUS | Status: DC | PRN
  Administered 2016-12-22: 80 mg via INTRAVENOUS
  Administered 2016-12-22: 20 mg via INTRAVENOUS

## 2016-12-22 MED ORDER — SODIUM CHLORIDE 0.9 % IV SOLN: INTRAVENOUS | Status: DC | PRN

## 2016-12-22 NOTE — Anesthesia Preprocedure Evaluation (Signed)
Anesthesia Evaluation  Patient identified by MRN, date of birth, ID band Patient awake    Reviewed: Allergy & Precautions, NPO status , Patient's Chart, lab work & pertinent test results  History of Anesthesia Complications Negative for: history of anesthetic complications  Airway Mallampati: II  TM Distance: >3 FB Neck ROM: Full    Dental no notable dental hx.    Pulmonary asthma (mild intermittent) , neg sleep apnea, neg COPD,    breath sounds clear to auscultation- rhonchi (-) wheezing      Cardiovascular hypertension, (-) CAD and (-) Past MI  Rhythm:Regular Rate:Normal - Systolic murmurs and - Diastolic murmurs    Neuro/Psych PSYCHIATRIC DISORDERS negative neurological ROS     GI/Hepatic negative GI ROS, Neg liver ROS,   Endo/Other  diabetes, Oral Hypoglycemic AgentsHypothyroidism   Renal/GU      Musculoskeletal negative musculoskeletal ROS (+)   Abdominal (+) + obese,   Peds  Hematology negative hematology ROS (+)   Anesthesia Other Findings Past Medical History: No date: Diabetes mellitus without complication (HCC) No date: Hypertension No date: Hyperthyroidism No date: Thyroid disease   Reproductive/Obstetrics                             Anesthesia Physical Anesthesia Plan  ASA: II  Anesthesia Plan: General   Post-op Pain Management:    Induction: Intravenous  Airway Management Planned: Natural Airway  Additional Equipment:   Intra-op Plan:   Post-operative Plan:   Informed Consent: I have reviewed the patients History and Physical, chart, labs and discussed the procedure including the risks, benefits and alternatives for the proposed anesthesia with the patient or authorized representative who has indicated his/her understanding and acceptance.   Dental advisory given  Plan Discussed with: CRNA and Anesthesiologist  Anesthesia Plan Comments:          Anesthesia Quick Evaluation

## 2016-12-22 NOTE — Anesthesia Postprocedure Evaluation (Signed)
Anesthesia Post Note  Patient: Megan Manning  Procedure(s) Performed: Procedure(s) (LRB): COLONOSCOPY WITH PROPOFOL (N/A)  Patient location during evaluation: Endoscopy Anesthesia Type: General Level of consciousness: awake and alert and oriented Pain management: pain level controlled Vital Signs Assessment: post-procedure vital signs reviewed and stable Respiratory status: spontaneous breathing, nonlabored ventilation and respiratory function stable Cardiovascular status: blood pressure returned to baseline and stable Postop Assessment: no signs of nausea or vomiting Anesthetic complications: no     Last Vitals:  Vitals:   12/22/16 0920 12/22/16 0930  BP: 137/81 (!) 149/97  Pulse: 88 90  Resp: (!) 31 (!) 22  Temp:      Last Pain:  Vitals:   12/22/16 0910  TempSrc: Tympanic                 [name]

## 2016-12-22 NOTE — Transfer of Care (Signed)
Immediate Anesthesia Transfer of Care Note  Patient: Megan Manning  Procedure(s) Performed: Procedure(s): COLONOSCOPY WITH PROPOFOL (N/A)  Patient Location: Endoscopy Unit  Anesthesia Type:General  Level of Consciousness: awake and alert   Airway & Oxygen Therapy: Patient Spontanous Breathing and Patient connected to nasal cannula oxygen  Post-op Assessment: Report given to RN and Post -op Vital signs reviewed and stable  Post vital signs: Reviewed and stable  Last Vitals:  Vitals:   12/22/16 0743  BP: (!) 160/96  Pulse: 92  Resp: 18  Temp: 36.6 C    Last Pain:  Vitals:   12/22/16 0743  TempSrc: Tympanic         Complications: No apparent anesthesia complications

## 2016-12-22 NOTE — H&P (Signed)
Megan Mood MD [address] [place], [place] [address] Phone: [phone] Fax : [phone]  Primary Care Physician:  [facility] Primary Gastroenterologist:  Dr. Wyline Manning   Pre-Procedure History & Physical: HPI:  Megan Manning is a 36 y.o. female is here for an colonoscopy.   Past Medical History:  Diagnosis Date  . Diabetes mellitus without complication (HCC)   . Hypertension   . Hyperthyroidism   . Thyroid disease     Past Surgical History:  Procedure Laterality Date  . NO PAST SURGERIES      Prior to Admission medications   Medication Sig Start Date End Date Taking? Authorizing Provider  metoprolol succinate (TOPROL-XL) 25 MG 24 hr tablet Take 25 mg by mouth.   Yes [provider]  budesonide (PULMICORT) 0.5 MG/2ML nebulizer solution Take 2 mLs (0.5 mg total) by nebulization 2 (two) times daily. 09/30/16 10/31/16  Adrian Saran, MD  clozapine (CLOZARIL) 50 MG tablet Take 150 mg by mouth daily.    [provider]  docusate sodium (COLACE) 100 MG capsule Take 100 mg by mouth 2 (two) times daily as needed for mild constipation.    [provider]  haloperidol (HALDOL) 5 MG tablet Take 5 mg by mouth daily.    [provider]  levothyroxine (SYNTHROID, LEVOTHROID) 88 MCG tablet Take 88 mcg by mouth daily.    [provider]  loratadine (CLARITIN) 10 MG tablet Take 10 mg by mouth daily.    [provider]  magnesium oxide (MAG-OX) 400 MG tablet Take 400 mg by mouth daily.    [provider]  metFORMIN (GLUCOPHAGE) 500 MG tablet Take 500 mg by mouth daily.    [provider]  OLANZapine (ZYPREXA) 5 MG tablet Take 5 mg by mouth.    [provider]  ondansetron (ZOFRAN ODT) 4 MG disintegrating tablet Take 1 tablet (4 mg total) by mouth every 8 (eight) hours as needed for nausea or vomiting. 07/24/16   Emily Filbert, MD  polyethylene glycol (MIRALAX / Ethelene Hal) packet  Take 17 g by mouth daily. 11/30/16 01/30/17  Megan Mood, MD  ranitidine (ZANTAC) 150 MG tablet Take 150 mg by mouth 2 (two) times daily.    [provider]  traMADol (ULTRAM) 50 MG tablet Take 50 mg by mouth.    [provider]    Allergies as of 11/30/2016  . (No Known Allergies)    Family History  Problem Relation Age of Onset  . CVA Neg Hx     Social History   Social History  . Marital status: Single    Spouse name: N/A  . Number of children: N/A  . Years of education: N/A   Occupational History  . Not on file.   Social History Main Topics  . Smoking status: Never Smoker  . Smokeless tobacco: Never Used  . Alcohol use No  . Drug use: No  . Sexual activity: Not on file   Other Topics Concern  . Not on file   Social History Narrative  . No narrative on file    Review of Systems: See HPI, otherwise negative ROS  Physical Exam: BP (!) 160/96   Pulse 92   Temp 97.8 F (36.6 C) (Tympanic)   Resp 18   Ht 5\' 3"  (1.6 m)   Wt 250 lb (113.4 kg)   SpO2 99%   BMI 44.29 kg/m  General:   Alert,  pleasant and cooperative in NAD Head:  Normocephalic and atraumatic. Neck:  Supple; no masses or thyromegaly. Lungs:  Clear throughout to auscultation.    Heart:  Regular rate and rhythm. Abdomen:  Soft, nontender and nondistended. Normal bowel sounds, without guarding, and without rebound.   Neurologic:  Alert and  oriented x4;  grossly normal neurologically.  Impression/Plan: Megan Manning is here for an colonoscopy to be performed for rectal bleeding   Risks, benefits, limitations, and alternatives regarding  colonoscopy have been reviewed with the patient.  Questions have been answered.  All parties agreeable.   Megan MoodKiran Lindora Alviar, MD  12/22/2016, 8:10 AM

## 2016-12-22 NOTE — Op Note (Signed)
[facility] Gastroenterology Patient Name: Megan Manning Procedure Date: 12/22/2016 8:20 AM MRN: [id number] Account #: [id number] Date of Birth: [birth year] Admit Type: Outpatient Age: 36 Room: [facility] ENDO ROOM 1 Gender: Female Note Status: Finalized Procedure:            Colonoscopy Indications:          Rectal bleeding Providers:            Wyline Mood MD, MD Referring MD:         No Local Md, MD (Referring MD) Medicines:            Monitored Anesthesia Care Complications:        No immediate complications. Procedure:            Pre-Anesthesia Assessment:                       - Prior to the procedure, a History and Physical was                        performed, and patient medications, allergies and                        sensitivities were reviewed. The patient's tolerance of                        previous anesthesia was reviewed.                       - The risks and benefits of the procedure and the                        sedation options and risks were discussed with the                        patient. All questions were answered and informed                        consent was obtained.                       - ASA Grade Assessment: II - A patient with mild                        systemic disease.                       After obtaining informed consent, the colonoscope was                        passed under direct vision. Throughout the procedure,                        the patient's blood pressure, pulse, and oxygen                        saturations were monitored continuously. The                        Colonoscope was introduced through the anus and                        advanced  to the the terminal ileum. The colonoscopy was                        performed with ease. The patient tolerated the                        procedure well. The quality of the bowel preparation                        was good. Findings:      The perianal and digital rectal  examinations were normal.      Non-bleeding internal hemorrhoids were found during retroflexion. The       hemorrhoids were medium-sized and Grade I (internal hemorrhoids that do       not prolapse).      The exam was otherwise without abnormality on direct and retroflexion       views. Impression:           - Non-bleeding internal hemorrhoids.                       - The examination was otherwise normal on direct and                        retroflexion views.                       - No specimens collected. Recommendation:       - Discharge patient to home (with escort).                       - Resume previous diet.                       - Continue present medications.                       - Use Benefiber one teaspoon PO BID for 6 weeks.                       - - Repeat colonoscopy atage 49 for screening purposes.                       - Return to my office PRN. Procedure Code(s):    --- Professional ---                       [phone], Colonoscopy, flexible; diagnostic, including                        collection of specimen(s) by brushing or washing, when                        performed (separate procedure) Diagnosis Code(s):    --- Professional ---                       K64.0, First degree hemorrhoids                       K62.5, Hemorrhage of anus and rectum CPT copyright 2016 American Medical Association. All rights reserved. The codes documented in this report are preliminary and upon coder review may  be  revised to meet current compliance requirements. Wyline MoodKiran Oni Dietzman, MD Wyline MoodKiran Anais Denslow MD, MD 12/22/2016 9:10:51 AM This report has been signed electronically. Number of Addenda: 0 Note Initiated On: 12/22/2016 8:20 AM Scope Withdrawal Time: 0 hours 9 minutes 43 seconds  Total Procedure Duration: 0 hours 14 minutes 38 seconds       [facility]

## 2016-12-22 NOTE — Anesthesia Post-op Follow-up Note (Signed)
Anesthesia QCDR form completed.        

## 2016-12-23 ENCOUNTER — Encounter: Payer: Self-pay | Admitting: Gastroenterology

## 2016-12-29 ENCOUNTER — Ambulatory Visit
Admission: RE | Admit: 2016-12-29 | Discharge: 2016-12-29 | Disposition: A | Discharge status: Home / Self Care | Payer: Medicaid Other | Source: Ambulatory Visit | Attending: Ophthalmology | Admitting: Ophthalmology

## 2016-12-29 DIAGNOSIS — H53453 Other localized visual field defect, bilateral: Secondary | ICD-10-CM | POA: Diagnosis not present

## 2016-12-29 DIAGNOSIS — H5340 Unspecified visual field defects: Secondary | ICD-10-CM | POA: Insufficient documentation

## 2017-01-01 MED ORDER — GADOBENATE DIMEGLUMINE 529 MG/ML IV SOLN
20.0000 mL | Freq: Once | INTRAVENOUS | Status: AC | PRN
  Administered 2017-01-01: 19 mL via INTRAVENOUS

## 2017-01-06 ENCOUNTER — Ambulatory Visit: Payer: Medicaid Other | Attending: Family Medicine

## 2017-01-21 ENCOUNTER — Telehealth: Payer: Self-pay | Admitting: Gastroenterology

## 2017-01-21 ENCOUNTER — Telehealth: Payer: Self-pay

## 2017-01-21 NOTE — Telephone Encounter (Signed)
[facility]up information concerning this patient.   - Use Benefiber one teaspoon PO BID for 6 weeks. - - Repeat colonoscopy atage 49 for screening purposes. - Return to my office PRN.  Dr. Carlynn PurlSowles requested colonoscopy discharge notes due to patient being poor historian.   Sent message to Dr. Tobi BastosAnna concerning patient's current medications for constipation. Dr. Carlynn PurlSowles is concerned that her symptoms may be poorly managed due to lack of clear communication from the patient and the patient's sister (caretaker) providing the medication.

## 2017-01-21 NOTE — Telephone Encounter (Signed)
[facility] [place] [facility]. Patients PCP would like to know what's next with this patient.  [phone]

## 2017-01-22 ENCOUNTER — Telehealth: Payer: Self-pay | Admitting: Gastroenterology

## 2017-01-22 ENCOUNTER — Other Ambulatory Visit: Payer: Self-pay

## 2017-01-22 NOTE — Telephone Encounter (Signed)
Please call patient as she is still having abd pain, constipated and blood in her stool. She saw Dr. Tobi BastosAnna in May.

## 2017-01-22 NOTE — Telephone Encounter (Signed)
Attempted to contact patient concerning medications being taken to manage constipation.   Unable to lvm on cell phone. No response to other family members contacted.   Contacted PCP to verify medications that were prescribed to patient and are being taken.  Dr Tobi BastosAnna has questions to be answered: 1. Find out exactly what meds she is on  2. If not had a bowel movement in a few days can take a dose of mag citrate  3. Based on what she is presently taking will decide on next step

## 2017-01-26 ENCOUNTER — Ambulatory Visit: Payer: Medicaid Other | Attending: Family Medicine | Admitting: Physical Therapy

## 2017-01-26 ENCOUNTER — Telehealth: Payer: Self-pay

## 2017-01-26 NOTE — Telephone Encounter (Signed)
-----   Message from Wyline MoodKiran Anna, MD sent at 01/22/2017  8:01 AM EDT ----- Regarding: RE: Constipation Meds 1. Find out exactly what meds she is on  2. If not had a bowel movement in a few days can take a dose of mag citrate  3. Based on what she is presently taking will decide on next step    ----- Message ----- From: Ethlyn Galleryarter, Wille Aubuchon, CMA Sent: 01/21/2017   4:43 PM To: Wyline MoodKiran Anna, MD Subject: Constipation Meds                              Dr. Tobi BastosAnna,   Dr. Carlynn PurlSowles office called this afternoon regarding discharge instructions and medications for the patient.   She states that the patient is a poor historian and she's afraid that her constipation symptoms and medications are being poorly managed. Currently the patient is taking 3 medications for constipation not including the Benefiber you requested at discharge.  I've sent the discharge instructions to Dr. Carlynn PurlSowles and told her I'd call back with your response.   Currently the patient has not had a bowel movement in either 2 weeks or 2 days, patient is unclear.  Please advise.  Alexsis Kathman

## 2017-01-26 NOTE — Telephone Encounter (Signed)
LVM for callback per Dr. Johnney KillianAnna's direction for answers to the following questions:   Regarding: RE: Constipation Meds 1. Find out exactly what meds she is on  2. If not had a bowel movement in a few days can take a dose of mag citrate  3. Based on what she is presently taking will decide on next step

## 2017-02-09 ENCOUNTER — Observation Stay
Admission: EM | Admit: 2017-02-09 | Discharge: 2017-02-10 | Disposition: A | Discharge status: Home / Self Care | Payer: Medicaid Other | Attending: Internal Medicine | Admitting: Internal Medicine

## 2017-02-09 ENCOUNTER — Encounter: Payer: Self-pay | Admitting: Emergency Medicine

## 2017-02-09 DIAGNOSIS — E872 Acidosis, unspecified: Secondary | ICD-10-CM

## 2017-02-09 DIAGNOSIS — T383X1A Poisoning by insulin and oral hypoglycemic [antidiabetic] drugs, accidental (unintentional), initial encounter: Principal | ICD-10-CM | POA: Insufficient documentation

## 2017-02-09 DIAGNOSIS — T50901A Poisoning by unspecified drugs, medicaments and biological substances, accidental (unintentional), initial encounter: Secondary | ICD-10-CM

## 2017-02-09 DIAGNOSIS — R000 Tachycardia, unspecified: Secondary | ICD-10-CM | POA: Diagnosis present

## 2017-02-09 DIAGNOSIS — E119 Type 2 diabetes mellitus without complications: Secondary | ICD-10-CM | POA: Insufficient documentation

## 2017-02-09 DIAGNOSIS — Z79899 Other long term (current) drug therapy: Secondary | ICD-10-CM | POA: Insufficient documentation

## 2017-02-09 DIAGNOSIS — F79 Unspecified intellectual disabilities: Secondary | ICD-10-CM

## 2017-02-09 DIAGNOSIS — T50902A Poisoning by unspecified drugs, medicaments and biological substances, intentional self-harm, initial encounter: Secondary | ICD-10-CM

## 2017-02-09 DIAGNOSIS — I10 Essential (primary) hypertension: Secondary | ICD-10-CM | POA: Insufficient documentation

## 2017-02-09 DIAGNOSIS — E0590 Thyrotoxicosis, unspecified without thyrotoxic crisis or storm: Secondary | ICD-10-CM | POA: Insufficient documentation

## 2017-02-09 DIAGNOSIS — R7989 Other specified abnormal findings of blood chemistry: Secondary | ICD-10-CM

## 2017-02-09 DIAGNOSIS — K219 Gastro-esophageal reflux disease without esophagitis: Secondary | ICD-10-CM | POA: Insufficient documentation

## 2017-02-09 DIAGNOSIS — R740 Nonspecific elevation of levels of transaminase and lactic acid dehydrogenase [LDH]: Secondary | ICD-10-CM | POA: Diagnosis not present

## 2017-02-09 DIAGNOSIS — F4325 Adjustment disorder with mixed disturbance of emotions and conduct: Secondary | ICD-10-CM | POA: Insufficient documentation

## 2017-02-09 DIAGNOSIS — E039 Hypothyroidism, unspecified: Secondary | ICD-10-CM | POA: Diagnosis not present

## 2017-02-09 DIAGNOSIS — Z7984 Long term (current) use of oral hypoglycemic drugs: Secondary | ICD-10-CM | POA: Diagnosis not present

## 2017-02-09 LAB — COMPREHENSIVE METABOLIC PANEL
ALT: 14 U/L (ref 14–54)
AST: 28 U/L (ref 15–41)
Albumin: 4.5 g/dL (ref 3.5–5.0)
Alkaline Phosphatase: 80 U/L (ref 38–126)
Anion gap: 9 (ref 5–15)
BUN: 14 mg/dL (ref 6–20)
CHLORIDE: 114 mmol/L — AB (ref 101–111)
CO2: 20 mmol/L — AB (ref 22–32)
Calcium: 9.2 mg/dL (ref 8.9–10.3)
Creatinine, Ser: 0.92 mg/dL (ref 0.44–1.00)
Glucose, Bld: 83 mg/dL (ref 65–99)
POTASSIUM: 4.6 mmol/L (ref 3.5–5.1)
SODIUM: 143 mmol/L (ref 135–145)
Total Bilirubin: 0.6 mg/dL (ref 0.3–1.2)
Total Protein: 8.1 g/dL (ref 6.5–8.1)

## 2017-02-09 LAB — BLOOD GAS, VENOUS
Acid-base deficit: 7.5 mmol/L — ABNORMAL HIGH (ref 0.0–2.0)
Bicarbonate: 18.8 mmol/L — ABNORMAL LOW (ref 20.0–28.0)
O2 SAT: 58.3 %
PATIENT TEMPERATURE: 37
pCO2, Ven: 40 mmHg — ABNORMAL LOW (ref 44.0–60.0)
pH, Ven: 7.28 (ref 7.250–7.430)
pO2, Ven: 35 mmHg (ref 32.0–45.0)

## 2017-02-09 LAB — URINE DRUG SCREEN, QUALITATIVE (ARMC ONLY)
AMPHETAMINES, UR SCREEN: NOT DETECTED
Barbiturates, Ur Screen: NOT DETECTED
Benzodiazepine, Ur Scrn: NOT DETECTED
Cannabinoid 50 Ng, Ur ~~LOC~~: NOT DETECTED
Cocaine Metabolite,Ur ~~LOC~~: NOT DETECTED
MDMA (ECSTASY) UR SCREEN: NOT DETECTED
Methadone Scn, Ur: NOT DETECTED
Opiate, Ur Screen: NOT DETECTED
Phencyclidine (PCP) Ur S: NOT DETECTED
TRICYCLIC, UR SCREEN: NOT DETECTED

## 2017-02-09 LAB — LACTIC ACID, PLASMA
LACTIC ACID, VENOUS: 3.4 mmol/L — AB (ref 0.5–1.9)
Lactic Acid, Venous: 3.6 mmol/L (ref 0.5–1.9)

## 2017-02-09 LAB — CBC
HCT: 40.6 % (ref 35.0–47.0)
Hemoglobin: 13.5 g/dL (ref 12.0–16.0)
MCH: 27.7 pg (ref 26.0–34.0)
MCHC: 33.1 g/dL (ref 32.0–36.0)
MCV: 83.7 fL (ref 80.0–100.0)
PLATELETS: 307 10*3/uL (ref 150–440)
RBC: 4.86 MIL/uL (ref 3.80–5.20)
RDW: 13.6 % (ref 11.5–14.5)
WBC: 6 10*3/uL (ref 3.6–11.0)

## 2017-02-09 LAB — GLUCOSE, CAPILLARY
GLUCOSE-CAPILLARY: 72 mg/dL (ref 65–99)
GLUCOSE-CAPILLARY: 78 mg/dL (ref 65–99)
Glucose-Capillary: 87 mg/dL (ref 65–99)
Glucose-Capillary: 76 mg/dL (ref 65–99)
Glucose-Capillary: 92 mg/dL (ref 65–99)
Glucose-Capillary: 86 mg/dL (ref 65–99)

## 2017-02-09 LAB — TSH: TSH: 2.225 u[IU]/mL (ref 0.350–4.500)

## 2017-02-09 LAB — PREGNANCY, URINE: PREG TEST UR: NEGATIVE

## 2017-02-09 LAB — MAGNESIUM: MAGNESIUM: 2.3 mg/dL (ref 1.7–2.4)

## 2017-02-09 LAB — SALICYLATE LEVEL

## 2017-02-09 LAB — ETHANOL

## 2017-02-09 LAB — ACETAMINOPHEN LEVEL: Acetaminophen (Tylenol), Serum: <10 ug/mL — ABNORMAL LOW (ref 10–30)

## 2017-02-09 MED ORDER — CHARCOAL ACTIVATED PO LIQD
ORAL | Status: AC
Start: 2017-02-09 — End: 2017-02-10
  Filled 2017-02-09: qty 240

## 2017-02-09 MED ORDER — SODIUM CHLORIDE 0.9 % IV BOLUS (SEPSIS)
1000.0000 mL | Freq: Once | INTRAVENOUS | Status: AC
  Administered 2017-02-09: 1000 mL via INTRAVENOUS

## 2017-02-09 MED ORDER — LORATADINE 10 MG PO TABS
10.0000 mg | ORAL_TABLET | Freq: Every day | ORAL | Status: DC
  Administered 2017-02-10: 10 mg via ORAL
  Filled 2017-02-09: qty 1

## 2017-02-09 MED ORDER — HEPARIN SODIUM (PORCINE) 5000 UNIT/ML IJ SOLN
5000.0000 [IU] | Freq: Three times a day (TID) | INTRAMUSCULAR | Status: DC
  Administered 2017-02-09 – 2017-02-10 (×2): 5000 [IU] via SUBCUTANEOUS
  Filled 2017-02-09 (×2): qty 1

## 2017-02-09 MED ORDER — LEVOTHYROXINE SODIUM 88 MCG PO TABS
88.0000 ug | ORAL_TABLET | Freq: Every day | ORAL | Status: DC
  Administered 2017-02-10: 88 ug via ORAL
  Filled 2017-02-09: qty 1

## 2017-02-09 MED ORDER — FAMOTIDINE 20 MG PO TABS: 20.0000 mg | ORAL_TABLET | Freq: Two times a day (BID) | ORAL | Status: DC

## 2017-02-09 MED ORDER — DEXTROSE 5 % IV SOLN
500.0000 mg | Freq: Once | INTRAVENOUS | Status: AC
  Administered 2017-02-10: 500 mg via INTRAVENOUS
  Filled 2017-02-09: qty 5

## 2017-02-09 MED ORDER — PANTOPRAZOLE SODIUM 40 MG PO TBEC
40.0000 mg | DELAYED_RELEASE_TABLET | Freq: Every day | ORAL | Status: DC
  Administered 2017-02-10: 40 mg via ORAL
  Filled 2017-02-09: qty 1

## 2017-02-09 MED ORDER — SODIUM BICARBONATE 8.4 % IV SOLN
INTRAVENOUS | Status: AC
  Filled 2017-02-09: qty 50

## 2017-02-09 MED ORDER — SODIUM BICARBONATE 8.4 % IV SOLN
100.0000 meq | Freq: Once | INTRAVENOUS | Status: AC
  Administered 2017-02-09: 100 meq via INTRAVENOUS
  Filled 2017-02-09: qty 50

## 2017-02-09 MED ORDER — ACTIDOSE WITH SORBITOL 50 GM/240ML PO LIQD
50.0000 g | Freq: Once | ORAL | Status: DC
  Filled 2017-02-09: qty 240

## 2017-02-09 MED ORDER — ONDANSETRON 4 MG PO TBDP
4.0000 mg | ORAL_TABLET | Freq: Three times a day (TID) | ORAL | Status: DC | PRN
  Filled 2017-02-09: qty 1

## 2017-02-09 MED ORDER — BUDESONIDE 0.5 MG/2ML IN SUSP
0.5000 mg | Freq: Two times a day (BID) | RESPIRATORY_TRACT | Status: DC
  Administered 2017-02-10: 0.5 mg via RESPIRATORY_TRACT
  Filled 2017-02-09: qty 2

## 2017-02-09 MED ORDER — MAGNESIUM OXIDE 400 (241.3 MG) MG PO TABS
400.0000 mg | ORAL_TABLET | Freq: Every day | ORAL | Status: DC
  Administered 2017-02-10: 400 mg via ORAL
  Filled 2017-02-09: qty 1

## 2017-02-09 MED ORDER — CHARCOAL ACTIVATED PO LIQD
ORAL | Status: AC
  Administered 2017-02-09: 50 g
  Filled 2017-02-09: qty 240

## 2017-02-09 MED ORDER — SODIUM BICARBONATE 8.4 % IV SOLN
INTRAVENOUS | Status: DC
  Administered 2017-02-09: 20:00:00 via INTRAVENOUS
  Filled 2017-02-09 (×5): qty 150

## 2017-02-09 MED ORDER — HALOPERIDOL 2 MG PO TABS
5.0000 mg | ORAL_TABLET | Freq: Every day | ORAL | Status: DC
  Administered 2017-02-10: 5 mg via ORAL
  Filled 2017-02-09: qty 3

## 2017-02-09 MED ORDER — DOCUSATE SODIUM 100 MG PO CAPS: 100.0000 mg | ORAL_CAPSULE | Freq: Two times a day (BID) | ORAL | Status: DC | PRN

## 2017-02-09 NOTE — Discharge Instructions (Addendum)
Only take your medications in the doses and frequencies that they are prescribed.  Please make sure that you have your thyroid function test checked every day for the next 5 days.  Return to the emergency department if you develop thoughts of hurting herself or anyone else, hallucinations, fast heart rate or palpitations, sweatiness, lightheadedness or fainting, or any other symptoms concerning to you.
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
## 2017-02-09 NOTE — Progress Notes (Signed)
PT complaining that her legs and stomach hurt 10/10 Spoke with Dr. Anne HahnWillis. MD to place order.

## 2017-02-09 NOTE — ED Notes (Signed)
Pt on phone with sister and grandmother.

## 2017-02-09 NOTE — ED Notes (Signed)
Pt brought back to room 20 by triage nurse. Pt dressed out in room by EDT Pam and this RN. Pt removed shirt, pants, underwear and two shoes. Lidocaine patch removed from right shoulder by this RN per MD request.
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
## 2017-02-09 NOTE — ED Provider Notes (Signed)
Clinical Course as of Feb 10 1908  Tue Feb 09, 2017  1817 Still lactic acidosis from metformin OD.  Lactic Acid, Venous: (!!) 3.4 [PS]  1901 Preg neg. Tyl neg. Uds neg. Lactate still 3.4, ph 7.28 desite 2L IVF. D/w nephrology, rec. Bicarb infusion. Due to unknown amount of metformin, unclear time to correction of acidosis. Will d/w hospitalist for further management.   [PS]    Clinical Course User Index [PS] Sharman CheekStafford, Jewels Langone, MD      Sharman CheekStafford, Ladarrion Telfair, MD 02/09/17 1910

## 2017-02-09 NOTE — ED Notes (Signed)
Psychiatrist at bedside with PA student.

## 2017-02-09 NOTE — ED Triage Notes (Signed)
Pt to ed with intentional overdose.  Pt reports taking all the meds left in bottles of ranitine, levothyoxine, omeprazole, haldol, metformin, and loratidine.  Pt tearful at triage.

## 2017-02-09 NOTE — H&P (Signed)
[employer] - [place] [facility]   PATIENT NAME: Megan Manning    MR#:  [id number]  DATE OF BIRTH:  09/13/1980  DATE OF ADMISSION:  02/09/2017  PRIMARY CARE PHYSICIAN: [facility]   REQUESTING/REFERRING PHYSICIAN: Scotty CourtStafford  CHIEF COMPLAINT:   Chief Complaint  Patient presents with  . Drug Overdose    HISTORY OF PRESENT ILLNESS: Megan Manning  is a 36 y.o. female with a known history of Diabetes, hypertension, hypothyroidism, developmental disorder- took her prescription medicines impulsively and then brought to Hospital by her family members. She denies any complaints, she had been in emergency room and monitored for last few hours, poison control center was also contacted as because of her metformin she had some acidosis. Poison control center after monitoring in our suggested she could be safely discharged if her follow-up lactic acid is coming down. She has been seen by psych physician already in ER and he suggested patient has no suicidal intention and she does not need inpatient psych treatment and she could be discharged to home from psych point of view. When ER physician repeated lactic acid in the evening it is still high so he phoned patient to be monitored and medical services until the lactic acid returns to normal and then she can be discharged home safely.  PAST MEDICAL HISTORY:   Past Medical History:  Diagnosis Date  . Diabetes mellitus without complication (HCC)   . Hypertension   . Hyperthyroidism   . Thyroid disease     PAST SURGICAL HISTORY: Past Surgical History:  Procedure Laterality Date  . COLONOSCOPY WITH PROPOFOL N/A 12/22/2016   Procedure: COLONOSCOPY WITH PROPOFOL;  Surgeon: Wyline MoodAnna, Kiran, MD;  [facility] ENDOSCOPY;  Service: Endoscopy;  Laterality: N/A;  . NO PAST SURGERIES      SOCIAL HISTORY:  Social History  Substance Use Topics  . Smoking status: Never Smoker  . Smokeless tobacco: Never Used  . Alcohol use No     FAMILY HISTORY:  Family History  Problem Relation Age of Onset  . CVA Neg Hx     DRUG ALLERGIES: No Known Allergies  REVIEW OF SYSTEMS:   CONSTITUTIONAL: No fever, fatigue or weakness.  EYES: No blurred or double vision.  EARS, NOSE, AND THROAT: No tinnitus or ear pain.  RESPIRATORY: No cough, shortness of breath, wheezing or hemoptysis.  CARDIOVASCULAR: No chest pain, orthopnea, edema.  GASTROINTESTINAL: No nausea, vomiting, diarrhea or abdominal pain.  GENITOURINARY: No dysuria, hematuria.  ENDOCRINE: No polyuria, nocturia,  HEMATOLOGY: No anemia, easy bruising or bleeding SKIN: No rash or lesion. MUSCULOSKELETAL: No joint pain or arthritis.   NEUROLOGIC: No tingling, numbness, weakness.  PSYCHIATRY: No anxiety or depression.   MEDICATIONS AT HOME:  Prior to Admission medications   Medication Sig Start Date End Date Taking? Authorizing Provider  budesonide (PULMICORT) 0.5 MG/2ML nebulizer solution Take 2 mLs (0.5 mg total) by nebulization 2 (two) times daily. 09/30/16 02/09/17 Yes Mody, Patricia PesaSital, MD  clozapine (CLOZARIL) 50 MG tablet Take 150 mg by mouth daily.   Yes [provider]  haloperidol (HALDOL) 5 MG tablet Take 5 mg by mouth daily.   Yes [provider]  hydrochlorothiazide (HYDRODIURIL) 12.5 MG tablet Take 12.5 mg by mouth daily.   Yes [provider]  levothyroxine (SYNTHROID, LEVOTHROID) 88 MCG tablet Take 88 mcg by mouth daily.   Yes [provider]  loratadine (CLARITIN) 10 MG tablet Take 10 mg by mouth daily.   Yes [provider]  magnesium oxide (MAG-OX) 400 MG tablet Take 400 mg by mouth daily.   Yes [provider]  metFORMIN (GLUCOPHAGE) 500 MG tablet Take 500 mg by mouth daily.   Yes [provider]  omeprazole (PRILOSEC) 20 MG capsule Take 20 mg by mouth daily.   Yes [provider]  ondansetron (ZOFRAN ODT) 4 MG disintegrating tablet Take 1 tablet (4 mg total) by mouth every 8 (eight)  hours as needed for nausea or vomiting. 07/24/16  Yes Emily Filbert, MD  ranitidine (ZANTAC) 150 MG tablet Take 150 mg by mouth 2 (two) times daily.   Yes [provider]      PHYSICAL EXAMINATION:   VITAL SIGNS: Blood pressure 139/68, pulse 92, temperature 99 F (37.2 C), temperature source Oral, resp. rate 17, height 5\' 3"  (1.6 m), weight 127 kg (280 lb), SpO2 97 %.  GENERAL:  36 y.o.-year-old patient lying in the bed with no acute distress.  EYES: Pupils equal, round, reactive to light and accommodation. No scleral icterus. Extraocular muscles intact.  HEENT: Head atraumatic, normocephalic. Oropharynx and nasopharynx clear.  NECK:  Supple, no jugular venous distention. No thyroid enlargement, no tenderness.  LUNGS: Normal breath sounds bilaterally, no wheezing, rales,rhonchi or crepitation. No use of accessory muscles of respiration.  CARDIOVASCULAR: S1, S2 normal. No murmurs, rubs, or gallops.  ABDOMEN: Soft, nontender, nondistended. Bowel sounds present. No organomegaly or mass.  EXTREMITIES: No pedal edema, cyanosis, or clubbing.  NEUROLOGIC: Cranial nerves II through XII are intact. Muscle strength 5/5 in all extremities. Sensation intact. Gait not checked.  PSYCHIATRIC: The patient is alert and oriented x 3.  SKIN: No obvious rash, lesion, or ulcer.   LABORATORY PANEL:   CBC  Recent Labs Lab 02/09/17 1246  WBC 6.0  HGB 13.5  HCT 40.6  PLT 307  MCV 83.7  MCH 27.7  MCHC 33.1  RDW 13.6   ------------------------------------------------------------------------------------------------------------------  Chemistries   Recent Labs Lab 02/09/17 1246  NA 143  K 4.6  CL 114*  CO2 20*  GLUCOSE 83  BUN 14  CREATININE 0.92  CALCIUM 9.2  MG 2.3  AST 28  ALT 14  ALKPHOS 80  BILITOT 0.6   ------------------------------------------------------------------------------------------------------------------ estimated creatinine clearance is 108.6 mL/min  (by C-G formula based on SCr of 0.92 mg/dL). ------------------------------------------------------------------------------------------------------------------  Recent Labs  02/09/17 1246  TSH 2.225     Coagulation profile No results for input(s): INR, PROTIME in the last 168 hours. ------------------------------------------------------------------------------------------------------------------- No results for input(s): DDIMER in the last 72 hours. -------------------------------------------------------------------------------------------------------------------  Cardiac Enzymes No results for input(s): CKMB, TROPONINI, MYOGLOBIN in the last 168 hours.  Invalid input(s): CK ------------------------------------------------------------------------------------------------------------------ Invalid input(s): POCBNP  ---------------------------------------------------------------------------------------------------------------  Urinalysis    Component Value Date/Time   COLORURINE YELLOW (A) 09/29/2016 0310   APPEARANCEUR CLEAR (A) 09/29/2016 0310   LABSPEC 1.027 09/29/2016 0310   PHURINE 6.0 09/29/2016 0310   GLUCOSEU NEGATIVE 09/29/2016 0310   HGBUR NEGATIVE 09/29/2016 0310   BILIRUBINUR NEGATIVE 09/29/2016 0310   KETONESUR NEGATIVE 09/29/2016 0310   PROTEINUR NEGATIVE 09/29/2016 0310   UROBILINOGEN 1.0 08/11/2009 1818   NITRITE NEGATIVE 09/29/2016 0310   LEUKOCYTESUR TRACE (A) 09/29/2016 0310     RADIOLOGY: No results found.  EKG: Orders placed or performed during the hospital encounter of 02/09/17  . ED EKG  . ED EKG  . EKG 12-Lead  . EKG 12-Lead    IMPRESSION AND PLAN:  * Lactic acidosis   Drug overdose   Overdose of metformin  IV bicarbonate drip,  monitor tomorrow morning. Seen by psych and cleared, currently I'll hold metformin and watch tomorrow morning.  * Hypertension    Hold home medications currently because of acidosis.  * Hypothyroid     Continue levothyroxine.  * Gastroesophageal reflux disease    Continue Prilosec and famotidine.  All the records are reviewed and case discussed with ED provider. Management plans discussed with the patient, family and they are in agreement.  CODE STATUS: full. Code Status History    Date Active Date Inactive Code Status Order ID Comments User Context   09/28/2016  8:36 PM 09/30/2016  8:20 PM Full Code [id number]  Milagros Loll, MD ED       TOTAL TIME TAKING CARE OF THIS PATIENT: 50 minutes.    Altamese Dilling M.D on 02/09/2017   Between 7am to 6pm - Pager - [phone]  After 6pm go to [email or web address] - Social research officer, government  [facility] [place] [facility]  Office  [phone]  CC: Primary care physician; [employer]   Note: This dictation was prepared with Nurse, children's dictation along with smaller Lobbyist. Any transcriptional errors that result from this process are unintentional.

## 2017-02-09 NOTE — ED Notes (Signed)
Pt belongings placed in bag, labeled and marked 1 of 1. Pt vitals taken. Urine attempted, but pt could not use it. Pt given warm blanket and is sitting in bed at this time with eyes closed and pt is on the monitor. This tech will perform 15 min checks and is sitting where pt can be viewed.

## 2017-02-09 NOTE — ED Notes (Signed)
Supper tray ordered
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
## 2017-02-09 NOTE — ED Notes (Signed)
Pt ambulating to bathroom unassisted.

## 2017-02-09 NOTE — ED Notes (Signed)
Attempted to draw labs x2. Unsuccessful. Lab called to come draw.

## 2017-02-09 NOTE — ED Notes (Signed)
Pt lying in bed resting. Pt continues to be monitored with 15 min checks as well as being able to see pt from seat.

## 2017-02-10 LAB — CBC
HCT: 35.2 % (ref 35.0–47.0)
Hemoglobin: 11.8 g/dL — ABNORMAL LOW (ref 12.0–16.0)
MCH: 28.2 pg (ref 26.0–34.0)
MCHC: 33.4 g/dL (ref 32.0–36.0)
MCV: 84.4 fL (ref 80.0–100.0)
Platelets: 238 10*3/uL (ref 150–440)
RBC: 4.17 MIL/uL (ref 3.80–5.20)
RDW: 13.6 % (ref 11.5–14.5)
WBC: 4.8 10*3/uL (ref 3.6–11.0)

## 2017-02-10 LAB — BASIC METABOLIC PANEL
ANION GAP: 7 (ref 5–15)
BUN: 13 mg/dL (ref 6–20)
CO2: 27 mmol/L (ref 22–32)
Calcium: 8 mg/dL — ABNORMAL LOW (ref 8.9–10.3)
Chloride: 107 mmol/L (ref 101–111)
Creatinine, Ser: 0.95 mg/dL (ref 0.44–1.00)
GFR calc Af Amer: >60 mL/min (ref >60)
GLUCOSE: 87 mg/dL (ref 65–99)
POTASSIUM: 3.8 mmol/L (ref 3.5–5.1)
Sodium: 141 mmol/L (ref 135–145)

## 2017-02-10 LAB — COMPREHENSIVE METABOLIC PANEL
ALK PHOS: 64 U/L (ref 38–126)
ALT: 12 U/L — ABNORMAL LOW (ref 14–54)
ANION GAP: 7 (ref 5–15)
AST: 22 U/L (ref 15–41)
Albumin: 4 g/dL (ref 3.5–5.0)
BUN: 11 mg/dL (ref 6–20)
CALCIUM: 8.2 mg/dL — AB (ref 8.9–10.3)
CHLORIDE: 107 mmol/L (ref 101–111)
CO2: 27 mmol/L (ref 22–32)
Creatinine, Ser: 0.95 mg/dL (ref 0.44–1.00)
GFR calc non Af Amer: >60 mL/min (ref >60)
Glucose, Bld: 82 mg/dL (ref 65–99)
POTASSIUM: 3.7 mmol/L (ref 3.5–5.1)
Sodium: 141 mmol/L (ref 135–145)
Total Bilirubin: 0.6 mg/dL (ref 0.3–1.2)
Total Protein: 6.8 g/dL (ref 6.5–8.1)

## 2017-02-10 LAB — BLOOD GAS, VENOUS
Acid-Base Excess: 3.9 mmol/L — ABNORMAL HIGH (ref 0.0–2.0)
BICARBONATE: 28.5 mmol/L — AB (ref 20.0–28.0)
O2 Saturation: 68.3 %
PATIENT TEMPERATURE: 37
PO2 VEN: 34 mmHg (ref 32.0–45.0)
pCO2, Ven: 42 mmHg — ABNORMAL LOW (ref 44.0–60.0)
pH, Ven: 7.44 — ABNORMAL HIGH (ref 7.250–7.430)

## 2017-02-10 LAB — LACTIC ACID, PLASMA
LACTIC ACID, VENOUS: 1.1 mmol/L (ref 0.5–1.9)
Lactic Acid, Venous: 1.3 mmol/L (ref 0.5–1.9)

## 2017-02-10 LAB — GLUCOSE, CAPILLARY: Glucose-Capillary: 74 mg/dL (ref 65–99)

## 2017-02-10 MED ORDER — INSULIN ASPART 100 UNIT/ML ~~LOC~~ SOLN: 0.0000 [IU] | Freq: Three times a day (TID) | SUBCUTANEOUS | Status: DC

## 2017-02-10 MED ORDER — SODIUM CHLORIDE 0.9 % IV SOLN
INTRAVENOUS | Status: DC
  Administered 2017-02-10: 03:00:00 via INTRAVENOUS

## 2017-02-10 MED ORDER — INSULIN ASPART 100 UNIT/ML ~~LOC~~ SOLN: 0.0000 [IU] | Freq: Every day | SUBCUTANEOUS | Status: DC

## 2017-02-10 NOTE — Progress Notes (Signed)
Pt remaining alert and oriented this shift. Lactic acid came down to 1.1. Complaint of cramps in her legs has gotten better after one time order for Robaxin. Pt eating and drinking without difficulty. No thoughts about hurting herself. Pt able to sleep in between care.

## 2017-02-10 NOTE — Plan of Care (Signed)
Problem: Safety: Goal: Ability to remain free from injury will improve Outcome: Progressing Pt asks for assistance when ambulating.  Problem: Pain Managment: Goal: General experience of comfort will improve Outcome: Progressing Complaints of cramps in her legs better after medication.  Problem: Activity: Goal: Risk for activity intolerance will decrease Outcome: Progressing Ambulating to the bathroom.  Problem: Fluid Volume: Goal: Ability to maintain a balanced intake and output will improve Outcome: Progressing Eating and drinking without difficulty

## 2017-02-10 NOTE — Progress Notes (Signed)
Megan ComaLashawn N Berringer to be D/C'd Home per MD order.  Discussed with the patient and all questions fully answered.  VSS, Skin clean, dry and intact without evidence of skin break down, no evidence of skin tears noted. IV catheter discontinued intact. Site without signs and symptoms of complications. Dressing and pressure applied.  An After Visit Summary was printed and given to the patient. Patient received prescription.  D/c education completed with patient/family including follow up instructions, medication list, d/c activities limitations if indicated, with other d/c instructions as indicated by MD - patient able to verbalize understanding, all questions fully answered.   Patient instructed to return to ED, call 911, or call MD for any changes in condition.   Patient escorted via WC, and D/C home via private auto.  Harvie HeckMelanie Aijalon Kirtz 02/10/2017 2:39 PM

## 2017-02-10 NOTE — Progress Notes (Signed)
Patient alert and oriented, complaining of abdominal discomfort. Vitals stable. Patient NS on tele. Oriented to call bell system.   Harvie HeckMelanie Jeanpaul Biehl, RN

## 2017-02-10 NOTE — Progress Notes (Signed)
Spoke with Dr. Anne HahnWillis pt Bicarbonate infusion is complete. MD to place new orders.

## 2017-02-11 LAB — HEMOGLOBIN A1C
HEMOGLOBIN A1C: 5.4 % (ref 4.8–5.6)
MEAN PLASMA GLUCOSE: 108 mg/dL

## 2017-02-12 ENCOUNTER — Emergency Department
Admission: EM | Admit: 2017-02-12 | Discharge: 2017-02-22 | Disposition: A | Discharge status: Home / Self Care | Payer: Medicaid Other | Attending: Emergency Medicine | Admitting: Emergency Medicine

## 2017-02-12 ENCOUNTER — Encounter: Payer: Self-pay | Admitting: Emergency Medicine

## 2017-02-12 DIAGNOSIS — E0590 Thyrotoxicosis, unspecified without thyrotoxic crisis or storm: Secondary | ICD-10-CM | POA: Insufficient documentation

## 2017-02-12 DIAGNOSIS — R45851 Suicidal ideations: Secondary | ICD-10-CM

## 2017-02-12 DIAGNOSIS — Z760 Encounter for issue of repeat prescription: Secondary | ICD-10-CM | POA: Insufficient documentation

## 2017-02-12 DIAGNOSIS — R1013 Epigastric pain: Secondary | ICD-10-CM | POA: Diagnosis not present

## 2017-02-12 DIAGNOSIS — Z046 Encounter for general psychiatric examination, requested by authority: Secondary | ICD-10-CM | POA: Diagnosis present

## 2017-02-12 DIAGNOSIS — F4325 Adjustment disorder with mixed disturbance of emotions and conduct: Secondary | ICD-10-CM | POA: Diagnosis present

## 2017-02-12 DIAGNOSIS — K3580 Unspecified acute appendicitis: Secondary | ICD-10-CM | POA: Diagnosis not present

## 2017-02-12 DIAGNOSIS — I10 Essential (primary) hypertension: Secondary | ICD-10-CM | POA: Insufficient documentation

## 2017-02-12 DIAGNOSIS — Z79899 Other long term (current) drug therapy: Secondary | ICD-10-CM | POA: Insufficient documentation

## 2017-02-12 DIAGNOSIS — R10816 Epigastric abdominal tenderness: Secondary | ICD-10-CM | POA: Diagnosis not present

## 2017-02-12 DIAGNOSIS — E119 Type 2 diabetes mellitus without complications: Secondary | ICD-10-CM | POA: Diagnosis not present

## 2017-02-12 DIAGNOSIS — F79 Unspecified intellectual disabilities: Secondary | ICD-10-CM

## 2017-02-12 DIAGNOSIS — E079 Disorder of thyroid, unspecified: Secondary | ICD-10-CM | POA: Insufficient documentation

## 2017-02-12 DIAGNOSIS — Z7984 Long term (current) use of oral hypoglycemic drugs: Secondary | ICD-10-CM | POA: Diagnosis not present

## 2017-02-12 LAB — COMPREHENSIVE METABOLIC PANEL WITH GFR
ALT: 14 U/L (ref 14–54)
AST: 20 U/L (ref 15–41)
Albumin: 4.3 g/dL (ref 3.5–5.0)
Alkaline Phosphatase: 61 U/L (ref 38–126)
Anion gap: 9 (ref 5–15)
BUN: 19 mg/dL (ref 6–20)
CO2: 21 mmol/L — ABNORMAL LOW (ref 22–32)
Calcium: 9.3 mg/dL (ref 8.9–10.3)
Chloride: 112 mmol/L — ABNORMAL HIGH (ref 101–111)
Creatinine, Ser: 0.79 mg/dL (ref 0.44–1.00)
GFR calc Af Amer: >60 mL/min
GFR calc non Af Amer: >60 mL/min
Glucose, Bld: 87 mg/dL (ref 65–99)
Potassium: 4 mmol/L (ref 3.5–5.1)
Sodium: 142 mmol/L (ref 135–145)
Total Bilirubin: 0.6 mg/dL (ref 0.3–1.2)
Total Protein: 7.4 g/dL (ref 6.5–8.1)

## 2017-02-12 LAB — URINE DRUG SCREEN, QUALITATIVE (ARMC ONLY)
Amphetamines, Ur Screen: NOT DETECTED
Barbiturates, Ur Screen: NOT DETECTED
Benzodiazepine, Ur Scrn: NOT DETECTED
Cannabinoid 50 Ng, Ur ~~LOC~~: NOT DETECTED
Cocaine Metabolite,Ur ~~LOC~~: NOT DETECTED
MDMA (Ecstasy)Ur Screen: NOT DETECTED
Methadone Scn, Ur: NOT DETECTED
Opiate, Ur Screen: NOT DETECTED
Phencyclidine (PCP) Ur S: NOT DETECTED
Tricyclic, Ur Screen: NOT DETECTED

## 2017-02-12 LAB — CBC WITH DIFFERENTIAL/PLATELET
Basophils Absolute: 0.1 K/uL (ref 0–0.1)
Basophils Relative: 2 %
Eosinophils Absolute: 0.1 K/uL (ref 0–0.7)
Eosinophils Relative: 1 %
HCT: 36.7 % (ref 35.0–47.0)
Hemoglobin: 12.4 g/dL (ref 12.0–16.0)
Lymphocytes Relative: 42 %
Lymphs Abs: 2.2 K/uL (ref 1.0–3.6)
MCH: 28.2 pg (ref 26.0–34.0)
MCHC: 33.7 g/dL (ref 32.0–36.0)
MCV: 83.7 fL (ref 80.0–100.0)
Monocytes Absolute: 0.3 K/uL (ref 0.2–0.9)
Monocytes Relative: 6 %
Neutro Abs: 2.7 K/uL (ref 1.4–6.5)
Neutrophils Relative %: 49 %
Platelets: 254 K/uL (ref 150–440)
RBC: 4.38 MIL/uL (ref 3.80–5.20)
RDW: 13.6 % (ref 11.5–14.5)
WBC: 5.3 K/uL (ref 3.6–11.0)

## 2017-02-12 LAB — URINALYSIS, COMPLETE (UACMP) WITH MICROSCOPIC
Bacteria, UA: NONE SEEN
Bilirubin Urine: NEGATIVE
Glucose, UA: NEGATIVE mg/dL
Ketones, ur: 5 mg/dL — AB
Nitrite: NEGATIVE
Protein, ur: NEGATIVE mg/dL
Specific Gravity, Urine: 1.031 — ABNORMAL HIGH (ref 1.005–1.030)
pH: 5 (ref 5.0–8.0)

## 2017-02-12 LAB — ETHANOL

## 2017-02-12 LAB — SALICYLATE LEVEL

## 2017-02-12 LAB — PREGNANCY, URINE: PREG TEST UR: NEGATIVE

## 2017-02-12 LAB — ACETAMINOPHEN LEVEL: Acetaminophen (Tylenol), Serum: <10 ug/mL — ABNORMAL LOW (ref 10–30)

## 2017-02-12 MED ORDER — LORAZEPAM 1 MG PO TABS
1.0000 mg | ORAL_TABLET | ORAL | Status: DC | PRN
  Administered 2017-02-17 – 2017-02-22 (×6): 1 mg via ORAL
  Filled 2017-02-12 (×8): qty 1

## 2017-02-12 MED ORDER — TETANUS-DIPHTH-ACELL PERTUSSIS 5-2.5-18.5 LF-MCG/0.5 IM SUSP: 0.5000 mL | Freq: Once | INTRAMUSCULAR | Status: DC

## 2017-02-12 NOTE — ED Notes (Signed)
Telepsych reports they were unable to reach patient's sister. States they will delay discharge until they are able to contact her.

## 2017-02-12 NOTE — ED Notes (Signed)
Patient refusing to have blood drawn after unsuccessful stick by NT. Informed patient that she was IVC'd and that blood work was ordered by the doctor and had to be drawn. Patient continues to refuse to uncross arms. MD made aware.
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
## 2017-02-12 NOTE — ED Notes (Signed)
Pt changed into wine colored scrubs. Personal belongings bagged and labeled.

## 2017-02-12 NOTE — ED Notes (Signed)
Per sister Megan Manning pt was walking around the [employer] parking lot yelling she was going to kill herself, recent overdose

## 2017-02-12 NOTE — BH Assessment (Signed)
Clinician received phone call pta (@ [phone]) from [facility](Jamal, [phone]). Representative states that pt is in route from [facility] to ED with sister. Per representative, pt was [facility] after receiving [facility] for [facility]. While at [facility], pt stated plans to OD again once she received her medicine. Per [facility] pt stated that she would not "be alive on Sunday". Antonietta JewelLaura, Quad RN notified of phone call.

## 2017-02-12 NOTE — Discharge Summary (Signed)
[employer] [facility] [place] [facility]   PATIENT NAME: Megan Manning    MR#:  [id number]  DATE OF BIRTH:  10-Dec-1980  DATE OF ADMISSION:  02/09/2017   ADMITTING PHYSICIAN: Altamese Dilling, MD  DATE OF DISCHARGE: 02/10/2017  2:47 PM  PRIMARY CARE PHYSICIAN: [facility]   ADMISSION DIAGNOSIS:   Sinus tachycardia [R00.0] Elevated lactic acid level [R79.89] Intentional drug overdose, initial encounter (HCC) [T50.902A]  DISCHARGE DIAGNOSIS:   Principal Problem:   Overdose Active Problems:   Intellectual disability   Adjustment disorder with mixed disturbance of emotions and conduct   Lactic acidosis   SECONDARY DIAGNOSIS:   Past Medical History:  Diagnosis Date  . Diabetes mellitus without complication (HCC)   . Hypertension   . Hyperthyroidism   . Thyroid disease     HOSPITAL COURSE:   36 year old female with diabetes, hypertension, developmental delay, hypothyroidism presents to Hospital after metformin overdose.  #1 metformin overdose-patient has developmental delay and took increased medication. However cleared by psychiatry in the hospital. -Watched for lactic acidosis that has improved. -Patient's GI symptoms have resolved as well.  #2 Developmental delay with psychiatric disorder-continue home medications.  #3 hypothyroidism-continue Synthroid  #4 GERD-PPI  Patient will be discharged home  DISCHARGE CONDITIONS:   Guarded  CONSULTS OBTAINED:   Treatment Team:  Audery Amel, MD  DRUG ALLERGIES:   No Known Allergies DISCHARGE MEDICATIONS:   Allergies as of 02/10/2017   No Known Allergies     Medication List    STOP taking these medications   budesonide 0.5 MG/2ML nebulizer solution Commonly known as:  PULMICORT   hydrochlorothiazide 12.5 MG tablet Commonly known as:  HYDRODIURIL     TAKE these medications   clozapine 50 MG tablet Commonly known as:  CLOZARIL Take 150 mg by mouth daily.     haloperidol 5 MG tablet Commonly known as:  HALDOL Take 5 mg by mouth daily.   levothyroxine 88 MCG tablet Commonly known as:  SYNTHROID, LEVOTHROID Take 88 mcg by mouth daily.   loratadine 10 MG tablet Commonly known as:  CLARITIN Take 10 mg by mouth daily.   magnesium oxide 400 MG tablet Commonly known as:  MAG-OX Take 400 mg by mouth daily.   metFORMIN 500 MG tablet Commonly known as:  GLUCOPHAGE Take 500 mg by mouth daily.   omeprazole 20 MG capsule Commonly known as:  PRILOSEC Take 20 mg by mouth daily.   ondansetron 4 MG disintegrating tablet Commonly known as:  ZOFRAN ODT Take 1 tablet (4 mg total) by mouth every 8 (eight) hours as needed for nausea or vomiting.   ranitidine 150 MG tablet Commonly known as:  ZANTAC Take 150 mg by mouth 2 (two) times daily.        DISCHARGE INSTRUCTIONS:    #1 PCP follow-up in 1-2 weeks  DIET:   Cardiac diet  ACTIVITY:   Activity as tolerated  OXYGEN:   Home Oxygen: No.  Oxygen Delivery: room air  DISCHARGE LOCATION:   home   If you experience worsening of your admission symptoms, develop shortness of breath, life threatening emergency, suicidal or homicidal thoughts you must seek medical attention immediately by calling 911 or calling your MD immediately  if symptoms less severe.  You Must read complete instructions/literature along with all the possible adverse reactions/side effects for all the Medicines you take and that have been prescribed to you. Take any new Medicines after you have completely understood and accpet all  the possible adverse reactions/side effects.   Please note  You were cared for by a hospitalist during your hospital stay. If you have any questions about your discharge medications or the care you received while you were in the hospital after you are discharged, you can call the unit and asked to speak with the hospitalist on call if the hospitalist that took care of you is not available.  Once you are discharged, your primary care physician will handle any further medical issues. Please note that NO REFILLS for any discharge medications will be authorized once you are discharged, as it is imperative that you return to your primary care physician (or establish a relationship with a primary care physician if you do not have one) for your aftercare needs so that they can reassess your need for medications and monitor your lab values.    On the day of Discharge:  VITAL SIGNS:   Blood pressure 123/70, pulse 81, temperature 99.9 F (37.7 C), temperature source Oral, resp. rate 18, height 5\' 3"  (1.6 m), weight 127.3 kg (280 lb 11.2 oz), SpO2 97 %.  PHYSICAL EXAMINATION:    GENERAL:  36 y.o.-year-old patient lying in the bed with no acute distress.  EYES: Pupils equal, round, reactive to light and accommodation. No scleral icterus. Extraocular muscles intact.  HEENT: Head atraumatic, normocephalic. Oropharynx and nasopharynx clear.  NECK:  Supple, no jugular venous distention. No thyroid enlargement, no tenderness.  LUNGS: Normal breath sounds bilaterally, no wheezing, rales,rhonchi or crepitation. No use of accessory muscles of respiration.  CARDIOVASCULAR: S1, S2 normal. No murmurs, rubs, or gallops.  ABDOMEN: Soft, non-tender, non-distended. Bowel sounds present. No organomegaly or mass.  EXTREMITIES: No pedal edema, cyanosis, or clubbing.  NEUROLOGIC: Cranial nerves II through XII are intact. Muscle strength 5/5 in all extremities. Sensation intact. Gait not checked.  PSYCHIATRIC: The patient is alert and oriented x 2-3. Cognitive decline SKIN: No obvious rash, lesion, or ulcer.   DATA REVIEW:   CBC  Recent Labs Lab 02/10/17 0254  WBC 4.8  HGB 11.8*  HCT 35.2  PLT 238    Chemistries   Recent Labs Lab 02/09/17 1246  02/10/17 0254  NA 143  < > 141  K 4.6  < > 3.7  CL 114*  < > 107  CO2 20*  < > 27  GLUCOSE 83  < > 82  BUN 14  < > 11  CREATININE 0.92  < >  0.95  CALCIUM 9.2  < > 8.2*  MG 2.3  --   --   AST 28  --  22  ALT 14  --  12*  ALKPHOS 80  --  64  BILITOT 0.6  --  0.6  < > = values in this interval not displayed.   Microbiology Results  No results found for this or any previous visit.  RADIOLOGY:  No results found.   Management plans discussed with the patient, family and they are in agreement.  CODE STATUS:  Code Status History    Date Active Date Inactive Code Status Order ID Comments User Context   02/09/2017  9:24 PM 02/10/2017  5:57 PM Full Code [id number]  Altamese DillingVachhani, Vaibhavkumar, MD Inpatient   09/28/2016  8:36 PM 09/30/2016  8:20 PM Full Code [id number]  Milagros LollSudini, Srikar, MD ED      TOTAL TIME TAKING CARE OF THIS PATIENT: 37 minutes.    Enid BaasKALISETTI,Sukari Grist M.D on 02/12/2017 at 4:28 PM  Between 7am to 6pm - Pager -  [phone]  After 6pm go to [email or web address] - Scientist, research (life sciences) [facility]  Office  [phone]  CC: Primary care physician; [employer] [employer]   Note: This dictation was prepared with Nurse, children's dictation along with smaller Lobbyist. Any transcriptional errors that result from this process are unintentional.

## 2017-02-12 NOTE — ED Notes (Signed)
Attempt to obtain blood sample unsuccessful. Lab notified and states they will send phlebotomy.

## 2017-02-12 NOTE — ED Notes (Signed)
Megan Manning states she is legal guardian, [phone], pt does not have any paperwork with her for proof of guardianship, arrives with pt

## 2017-02-12 NOTE — ED Notes (Signed)
Patient tearful when talking to this RN. States she "just needs to get my medication figured out". Denies SI/HI. Denies any recent stressors. Admits to intentional overdose on Tuesday for which she was seen. Patient also reports decreased appetite and stomach pain since Tuesday.
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
## 2017-02-13 LAB — GLUCOSE, CAPILLARY: Glucose-Capillary: 71 mg/dL (ref 65–99)

## 2017-02-13 MED ORDER — CLOZAPINE 25 MG PO TABS: 150.0000 mg | ORAL_TABLET | Freq: Every day | ORAL | Status: DC

## 2017-02-13 MED ORDER — HYDROXYZINE PAMOATE 25 MG PO CAPS: 25.0000 mg | ORAL_CAPSULE | Freq: Three times a day (TID) | ORAL | 0 refills | Status: DC | PRN

## 2017-02-13 MED ORDER — MAGNESIUM OXIDE 400 (241.3 MG) MG PO TABS
400.0000 mg | ORAL_TABLET | Freq: Every day | ORAL | Status: DC
  Administered 2017-02-13 – 2017-02-22 (×9): 400 mg via ORAL
  Filled 2017-02-13 (×9): qty 1

## 2017-02-13 MED ORDER — METFORMIN HCL 500 MG PO TABS
500.0000 mg | ORAL_TABLET | Freq: Every day | ORAL | Status: DC
  Administered 2017-02-13 – 2017-02-21 (×3): 500 mg via ORAL
  Filled 2017-02-13 (×5): qty 1

## 2017-02-13 MED ORDER — LEVOTHYROXINE SODIUM 88 MCG PO TABS
88.0000 ug | ORAL_TABLET | Freq: Every day | ORAL | Status: DC
  Administered 2017-02-13 – 2017-02-22 (×9): 88 ug via ORAL
  Filled 2017-02-13 (×9): qty 1

## 2017-02-13 MED ORDER — PANTOPRAZOLE SODIUM 40 MG PO TBEC
40.0000 mg | DELAYED_RELEASE_TABLET | Freq: Every day | ORAL | Status: DC
  Administered 2017-02-13 – 2017-02-22 (×9): 40 mg via ORAL
  Filled 2017-02-13 (×9): qty 1

## 2017-02-13 MED ORDER — HALOPERIDOL 5 MG PO TABS
5.0000 mg | ORAL_TABLET | Freq: Every day | ORAL | Status: DC
  Administered 2017-02-13 – 2017-02-22 (×9): 5 mg via ORAL
  Filled 2017-02-13 (×10): qty 1

## 2017-02-13 MED ORDER — LORATADINE 10 MG PO TABS
10.0000 mg | ORAL_TABLET | Freq: Every day | ORAL | Status: DC
  Administered 2017-02-13 – 2017-02-22 (×9): 10 mg via ORAL
  Filled 2017-02-13 (×9): qty 1

## 2017-02-13 NOTE — ED Notes (Signed)
Pt lying on right side sleeping with equal rise and fall of chest noted.

## 2017-02-13 NOTE — Progress Notes (Signed)
Received called from Dr. Cyril LoosenKinner, reviewed patient chart, went to see and assessed patient. Patient appears to be limited in her intellectual disability. Patient does not meet criteria for BHU. Pt is very child like. Pt is speaking in a child voice. Pt can not be transferred to this unit at this time. Pt is not appropriate for milieu due to her intellectual disability. Patient has not been assessed by TTS for recommendation at this time. Due to patient Intellectual disability patient is not appropriate for milieu environment.

## 2017-02-13 NOTE — ED Notes (Signed)
Pt continuing to sleep on right side with hands visible. Equal rise and fall of chest noted at this time.

## 2017-02-13 NOTE — ED Notes (Signed)
BEHAVIORAL HEALTH ROUNDING  Patient sleeping: No.  Patient alert and oriented: yes  Behavior appropriate: Yes. ; If no, describe:  Nutrition and fluids offered: Yes  Toileting and hygiene offered: Yes  Sitter present: not applicable, Q 15 min safety rounds and observation.  Law enforcement present: Yes ODS  

## 2017-02-13 NOTE — ED Notes (Signed)
ENVIRONMENTAL ASSESSMENT  Potentially harmful objects out of patient reach: Yes.  Personal belongings secured: Yes.  Patient dressed in hospital provided attire only: Yes.  Plastic bags out of patient reach: Yes.  Patient care equipment (cords, cables, call bells, lines, and drains) shortened, removed, or accounted for: Yes.  Equipment and supplies removed from bottom of stretcher: Yes.  Potentially toxic materials out of patient reach: Yes.  Sharps container removed or out of patient reach: Yes.   ED BHU PLACEMENT JUSTIFICATION  Is the patient under IVC or is there intent for IVC: Yes.  Is the patient medically cleared: Yes.  Is there vacancy in the ED BHU: Yes.  Is the population mix appropriate for patient: No.  Is the patient awaiting placement in inpatient or outpatient setting: Yes.  Has the patient had a psychiatric consult: Yes.  Survey of unit performed for contraband, proper placement and condition of furniture, tampering with fixtures in bathroom, shower, and each patient room: Yes. ; Findings: All clear  APPEARANCE/BEHAVIOR  calm, cooperative and adequate rapport can be established  NEURO ASSESSMENT  Orientation: time, place and person  Hallucinations: No.None noted (Hallucinations)  Speech: Normal  Gait: normal  RESPIRATORY ASSESSMENT  WNL  CARDIOVASCULAR ASSESSMENT  WNL  GASTROINTESTINAL ASSESSMENT  WNL  EXTREMITIES  Bilateral lower extremity swelling noted.  PLAN OF CARE  Provide calm/safe environment. Vital signs assessed TID. ED BHU Assessment once each 12-hour shift. Collaborate with TTS daily or as condition indicates. Assure the ED provider has rounded once each shift. Provide and encourage hygiene. Provide redirection as needed. Assess for escalating behavior; address immediately and inform ED provider.  Assess family dynamic and appropriateness for visitation as needed: Yes. ; If necessary, describe findings:  Educate the patient/family about BHU  procedures/visitation: Yes. ; If necessary, describe findings: Pt is calm and cooperative at this time. Pt understanding and accepting of unit procedures/rules. Will continue to monitor with Q 15 min safety rounds and observation.

## 2017-02-13 NOTE — ED Notes (Signed)
Pt sleeping at this time. Pt has equal and unlabored resp at this time.

## 2017-02-13 NOTE — Discharge Instructions (Addendum)
You have been seen in the emergency department for a  psychiatric concern. You have been evaluated both medically as well as psychiatrically. Please follow-up with your outpatient resources provided. Return to the emergency department for any worsening symptoms, or any thoughts of hurting yourself or anyone else so that we may attempt to help you. 

## 2017-02-13 NOTE — ED Notes (Signed)
Called BHU to see if pt could be moved. Refused pt due to mild MR. Dr. Shaune PollackLord notified.

## 2017-02-13 NOTE — ED Notes (Addendum)
There are some inconsistence's in TTS reports and the pts medical record. Per TTS reports the pts discharged has been delayed due to the tele-psychiatrists inability to contact the pts guardian at the time of discharge.   Pts chart indicates that the pt is pending psychiatric placement .    BH Counselor has spoke with the pt who continues to be oriented to person only. She was able to confirm that her sister Maia PettiesSheneka Lewing ([phone]) is her legal guardian. Patient did have complaints of extreme swelling in both legs. Pt. denies any suicidal ideation, plan or intent. Pt. denies the presence of any auditory or visual hallucinations at this time.    BH Counselor has contacted the pts guardian who states that the pt does live with her and that she wants to be " normal" although she is not capable of doing so.She reports that she needs constant supervision. She continued to explained that the pt has always done these things when she does not get her way. She reports that she honestly does not believe that the pt intended to kill herself and that she acts this way in attempts to seek attention.     BH Counselor has spoken with Dr. Lucianne MussKumar who has recommended discharge. Per Dr. Lucianne MussKumar the pt can be prescribed Vistaril 25-50 mg PRN.  Pts guardian will be provided with a clinical update as well as the pts nurse. BH Counselor has spoken with Dr. Cyril LoosenKinner who is in agreement with the pts discharge plan.

## 2017-02-13 NOTE — ED Notes (Signed)
Enter in error

## 2017-02-13 NOTE — ED Notes (Signed)
Spoke with Joni ReiningNicole, TTS informed patient is going to be discharged.

## 2017-02-13 NOTE — ED Notes (Signed)
PT IVC/ PENDING PLACEMENT  

## 2017-02-13 NOTE — ED Notes (Signed)
BH Counselor has received a call from the pts nurse Biomedical scientist(Bill) who states that the pt will not be discharged at this time because no one is available to rescinded the pts IVC paperwork  [facility] the pts guardian to inform her of the change in plan of care. She was informed that the pt has been cleared by the Psychiatric Department although there are legalities involved that will not allow the pt to be discharged home at this time, she was asked to be prepared to pick the pt up when contacted.

## 2017-02-13 NOTE — ED Provider Notes (Addendum)
Cleared for discharge by Dr. Nelly RoutArchana Kumar, recommends Vistaril 25 mg TID PRN   Jene EveryKinner, Rayhana Slider, MD 02/13/17 1037    Dr. Lucianne MussKumar has not overturned the IVC. Patient unable to be discharged   Jene EveryKinner, Saman Giddens, MD 02/13/17 1229

## 2017-02-14 ENCOUNTER — Encounter: Payer: Self-pay | Admitting: Radiology

## 2017-02-14 ENCOUNTER — Emergency Department: Payer: Medicaid Other

## 2017-02-14 DIAGNOSIS — R10816 Epigastric abdominal tenderness: Secondary | ICD-10-CM

## 2017-02-14 LAB — GLUCOSE, CAPILLARY
Glucose-Capillary: 65 mg/dL (ref 65–99)
Glucose-Capillary: 66 mg/dL (ref 65–99)
Glucose-Capillary: 67 mg/dL (ref 65–99)
Glucose-Capillary: 66 mg/dL (ref 65–99)
Glucose-Capillary: 61 mg/dL — ABNORMAL LOW (ref 65–99)
Glucose-Capillary: 83 mg/dL (ref 65–99)

## 2017-02-14 LAB — URINALYSIS, COMPLETE (UACMP) WITH MICROSCOPIC
BILIRUBIN URINE: NEGATIVE
Glucose, UA: NEGATIVE mg/dL
Ketones, ur: 20 mg/dL — AB
Nitrite: NEGATIVE
PH: 5 (ref 5.0–8.0)
Protein, ur: NEGATIVE mg/dL

## 2017-02-14 LAB — COMPREHENSIVE METABOLIC PANEL
ALK PHOS: 59 U/L (ref 38–126)
ALT: 13 U/L — AB (ref 14–54)
AST: 18 U/L (ref 15–41)
Albumin: 4.3 g/dL (ref 3.5–5.0)
Anion gap: 10 (ref 5–15)
BILIRUBIN TOTAL: 0.6 mg/dL (ref 0.3–1.2)
BUN: 19 mg/dL (ref 6–20)
CALCIUM: 9.1 mg/dL (ref 8.9–10.3)
CHLORIDE: 109 mmol/L (ref 101–111)
CO2: 20 mmol/L — ABNORMAL LOW (ref 22–32)
CREATININE: 0.84 mg/dL (ref 0.44–1.00)
Glucose, Bld: 72 mg/dL (ref 65–99)
Potassium: 3.8 mmol/L (ref 3.5–5.1)
Sodium: 139 mmol/L (ref 135–145)
TOTAL PROTEIN: 7.6 g/dL (ref 6.5–8.1)

## 2017-02-14 LAB — CBC WITH DIFFERENTIAL/PLATELET
BASOS ABS: 0 10*3/uL (ref 0–0.1)
Basophils Relative: 1 %
EOS ABS: 0 10*3/uL (ref 0–0.7)
EOS PCT: 1 %
HCT: 37.8 % (ref 35.0–47.0)
Hemoglobin: 12.6 g/dL (ref 12.0–16.0)
LYMPHS PCT: 42 %
Lymphs Abs: 2.1 10*3/uL (ref 1.0–3.6)
MCH: 27.7 pg (ref 26.0–34.0)
MCHC: 33.4 g/dL (ref 32.0–36.0)
MCV: 83.1 fL (ref 80.0–100.0)
MONO ABS: 0.4 10*3/uL (ref 0.2–0.9)
Monocytes Relative: 8 %
Neutro Abs: 2.5 10*3/uL (ref 1.4–6.5)
Neutrophils Relative %: 48 %
PLATELETS: 264 10*3/uL (ref 150–440)
RBC: 4.55 MIL/uL (ref 3.80–5.20)
RDW: 13.5 % (ref 11.5–14.5)
WBC: 5.1 10*3/uL (ref 3.6–11.0)

## 2017-02-14 LAB — LIPASE, BLOOD: LIPASE: 32 U/L (ref 11–51)

## 2017-02-14 MED ORDER — METRONIDAZOLE IN NACL 5-0.79 MG/ML-% IV SOLN
500.0000 mg | Freq: Three times a day (TID) | INTRAVENOUS | Status: DC
  Administered 2017-02-14 – 2017-02-15 (×3): 500 mg via INTRAVENOUS
  Filled 2017-02-14 (×3): qty 100

## 2017-02-14 MED ORDER — DEXTROSE-NACL 5-0.9 % IV SOLN
INTRAVENOUS | Status: DC
  Administered 2017-02-14: 21:00:00 via INTRAVENOUS

## 2017-02-14 MED ORDER — GI COCKTAIL ~~LOC~~
ORAL | Status: AC
  Administered 2017-02-14: 30 mL via ORAL
  Filled 2017-02-14: qty 30

## 2017-02-14 MED ORDER — DEXTROSE 5 % IV SOLN
2.0000 g | INTRAVENOUS | Status: DC
  Administered 2017-02-14: 2 g via INTRAVENOUS
  Filled 2017-02-14 (×2): qty 2

## 2017-02-14 MED ORDER — IOPAMIDOL (ISOVUE-370) INJECTION 76%
100.0000 mL | Freq: Once | INTRAVENOUS | Status: AC | PRN
  Administered 2017-02-14: 100 mL via INTRAVENOUS

## 2017-02-14 MED ORDER — IOPAMIDOL (ISOVUE-300) INJECTION 61%: 30.0000 mL | Freq: Once | INTRAVENOUS | Status: DC | PRN

## 2017-02-14 MED ORDER — DEXTROSE 5 % IV SOLN: 1.0000 g | INTRAVENOUS | Status: DC

## 2017-02-14 MED ORDER — ONDANSETRON 4 MG PO TBDP
4.0000 mg | ORAL_TABLET | Freq: Once | ORAL | Status: AC
  Filled 2017-02-14: qty 1

## 2017-02-14 MED ORDER — GI COCKTAIL ~~LOC~~
30.0000 mL | Freq: Once | ORAL | Status: AC
  Administered 2017-02-14: 30 mL via ORAL

## 2017-02-14 NOTE — Consult Note (Signed)
SURGICAL CONSULTATION NOTE (initial) - cpt: 99254  HISTORY OF PRESENT ILLNESS (HPI):  36 y.o. female [facility] days ago (7/17) after attempting suicide by reportedly taking all of her medications (reportedly >400 pills) after saying patient's mother was making fun of her and threatening to attempt suicide again if allowed. After taking the 400 pills, patient began to report generalized abdominal pain. Since then, progress notes indicate that patient reported resolution of her abdominal pain, but refused to eat or drink over the past 48 hours and again began complaining of generalized abdominal pain "since Tuesday" (7/17), for which CT was performed today. When asked, patient now says the pain is somewhat more over her Right abdomen, but points to her RUQ. Patient has a history of cognitive delay with borderline personality disorder and adjustment disorder with mixed disturbance of emotions and conduct and is an unreliable historian. Patient otherwise reports +flatus and denies fever/chills, CP, or SOB.  Surgery is consulted by ED physician Dr. Pershing ProudSchaevitz in this context for evaluation and management of possible appendicitis.  PAST MEDICAL HISTORY (PMH):  Past Medical History:  Diagnosis Date  . Diabetes mellitus without complication (HCC)   . Hypertension   . Hyperthyroidism   . Thyroid disease      PAST SURGICAL HISTORY (PSH):  Past Surgical History:  Procedure Laterality Date  . COLONOSCOPY WITH PROPOFOL N/A 12/22/2016   Procedure: COLONOSCOPY WITH PROPOFOL;  Surgeon: Wyline MoodAnna, Kiran, MD;  [facility];  Service: Endoscopy;  Laterality: N/A;  . NO PAST SURGERIES       MEDICATIONS:  Prior to Admission medications   Medication Sig Start Date End Date Taking? Authorizing Provider  clozapine (CLOZARIL) 50 MG tablet Take 150 mg by mouth daily.   Yes [provider]  haloperidol (HALDOL) 5 MG tablet Take 5 mg by mouth daily.   Yes [provider]   levothyroxine (SYNTHROID, LEVOTHROID) 88 MCG tablet Take 88 mcg by mouth daily.   Yes [provider]  loratadine (CLARITIN) 10 MG tablet Take 10 mg by mouth daily.   Yes [provider]  magnesium oxide (MAG-OX) 400 MG tablet Take 400 mg by mouth daily.   Yes [provider]  metFORMIN (GLUCOPHAGE) 500 MG tablet Take 500 mg by mouth daily.   Yes [provider]  omeprazole (PRILOSEC) 20 MG capsule Take 20 mg by mouth daily.   Yes [provider]  ondansetron (ZOFRAN ODT) 4 MG disintegrating tablet Take 1 tablet (4 mg total) by mouth every 8 (eight) hours as needed for nausea or vomiting. 07/24/16  Yes Emily FilbertWilliams, Jonathan E, MD  ranitidine (ZANTAC) 150 MG tablet Take 150 mg by mouth 2 (two) times daily.   Yes [provider]  hydrOXYzine (VISTARIL) 25 MG capsule Take 1 capsule (25 mg total) by mouth 3 (three) times daily as needed. 02/13/17   Jene EveryKinner, Robert, MD     ALLERGIES:  Allergies  Allergen Reactions  . Chlorpromazine Other (See Comments)    Caused cornea problems     SOCIAL HISTORY:  Social History   Social History  . Marital status: Single    Spouse name: N/A  . Number of children: N/A  . Years of education: N/A   Occupational History  . Not on file.   Social History Main Topics  . Smoking status: Never Smoker  . Smokeless tobacco: Never Used  . Alcohol use No  . Drug use: No  . Sexual activity: Not on file  Other Topics Concern  . Not on file   Social History Narrative  . No narrative on file    The patient currently resides (home / rehab facility / nursing home): Awaiting placement for involuntary commitment  The patient normally is (ambulatory / bedbound): Ambulatory   FAMILY HISTORY:  Family History  Problem Relation Age of Onset  . CVA Neg Hx      REVIEW OF SYSTEMS:  Constitutional: denies weight loss, fever, chills, or sweats  Eyes: denies any other vision changes, history of eye injury  ENT:  denies sore throat, hearing problems  Respiratory: denies shortness of breath, wheezing  Cardiovascular: denies chest pain, palpitations  Gastrointestinal: abdominal pain, N/V, and bowel function as per HPI Genitourinary: denies burning with urination or urinary frequency Musculoskeletal: denies any other joint pains or cramps  Skin: denies any other rashes or skin discolorations  Neurological: denies any other headache, dizziness, weakness  Psychiatric: denies any other depression, anxiety   All other review of systems were negative   VITAL SIGNS:  Temp:  [98.4 F (36.9 C)] 98.4 F (36.9 C) (07/21 2306) Pulse Rate:  [69] 69 (07/21 2306) Resp:  [16] 16 (07/21 2306) BP: (147)/(99) 147/99 (07/21 2306) SpO2:  [96 %] 96 % (07/21 2306)     Height: 5\' 3"  (160 cm) Weight: 280 lb (127 kg) BMI (Calculated): 49.7   INTAKE/OUTPUT:  This shift: No intake/output data recorded.  Last 2 shifts: @IOLAST2SHIFTS @   PHYSICAL EXAM:  Constitutional:  -- Overweight body habitus  -- Awake, alert, and oriented x3  Eyes:  -- Pupils equally round and reactive to light  -- No scleral icterus  Ear, nose, and throat:  -- No jugular venous distension  Pulmonary:  -- No crackles  -- Equal breath sounds bilaterally -- Breathing non-labored at rest Cardiovascular:  -- S1, S2 present  -- No pericardial rubs Gastrointestinal:  -- Abdomen soft and nondistended, variable tenderness to palpation, no guarding/rebound  -- No abdominal masses appreciated, pulsatile or otherwise  Musculoskeletal and Integumentary:  -- Wounds or skin discoloration: None appreciated -- Extremities: B/L UE and LE FROM, hands and feet warm, no edema  Neurologic:  -- Motor function: intact and symmetric -- Sensation: intact and symmetric  Labs:  CBC Latest Ref Rng & Units 02/14/2017 02/12/2017 02/10/2017  WBC 3.6 - 11.0 K/uL 5.1 5.3 4.8  Hemoglobin 12.0 - 16.0 g/dL 16.1 09.6 11.8(L)  Hematocrit 35.0 - 47.0 % 37.8 36.7 35.2   Platelets 150 - 440 K/uL 264 254 238   CMP Latest Ref Rng & Units 02/14/2017 02/12/2017 02/10/2017  Glucose 65 - 99 mg/dL 72 87 82  BUN 6 - 20 mg/dL 19 19 11   Creatinine 0.44 - 1.00 mg/dL 0.45 4.09 8.11  Sodium 135 - 145 mmol/L 139 142 141  Potassium 3.5 - 5.1 mmol/L 3.8 4.0 3.7  Chloride 101 - 111 mmol/L 109 112(H) 107  CO2 22 - 32 mmol/L 20(L) 21(L) 27  Calcium 8.9 - 10.3 mg/dL 9.1 9.3 9.1(Y)  Total Protein 6.5 - 8.1 g/dL 7.6 7.4 6.8  Total Bilirubin 0.3 - 1.2 mg/dL 0.6 0.6 0.6  Alkaline Phos 38 - 126 U/L 59 61 64  AST 15 - 41 U/L 18 20 22   ALT 14 - 54 U/L 13(L) 14 12(L)   Imaging studies:  CT Abdomen and Pelvis with Contrast (02/14/2017) Distal appendix appears enlarged and ill-defined with questionable periappendiceal infiltrative changes, cannot exclude distal appendicitis.  Assessment/Plan: (ICD-10's: R10.8) 36 y.o. female with variable abdominal  pain of unclear etiology and refusal to eat or drink with equivocal CT imaging and normal WBC, having been in [facility] ED x past 5 days, complicated by pertinent comorbidities including morbid obesity (BMI 50), DM, HTN, hyperthyroidism, dysphasia, history of foreign body ingestion, transient cerebral ischemia, borderline personality disorder, adjustment disorder with mixed disturbance of emotions and conduct, and recent drug overdose.   - diagnostic studies and symptoms discussed with patient's sister/POA  - patient's sister expresses wishes to avoid surgery and preference to treat with antibiotics alone   - considering uncertainty of appendicitis diagnosis for patient and POA's wish to avoid surgery, Rocephin + Flagyl advised  - medical management of comorbidities and supportive care per ED and medical team  - DVT prophylaxis  All of the above findings and recommendations were discussed with the patient, her sister, ED physician, and patient's RN, and all of patient's and her family's questions were answered to their expressed  satisfaction.  Thank you for the opportunity to participate in this patient's care.   -- Scherrie Gerlach Earlene Plater, MD, RPVI [place]: [facility] - Partnering for exceptional care. Office: [phone]

## 2017-02-14 NOTE — ED Notes (Signed)
BEHAVIORAL HEALTH ROUNDING Patient sleeping: Yes.   Patient alert and oriented: not applicable SLEEPING Behavior appropriate: Yes.  ; If no, describe: SLEEPING Nutrition and fluids offered: No SLEEPING Toileting and hygiene offered: NoSLEEPING Sitter present: not applicable, Q 15 min safety rounds and observation. Law enforcement present: Yes ODS 

## 2017-02-14 NOTE — ED Provider Notes (Addendum)
CT results show possible tip appendicitis. Surgery was called and evaluated the patient reports that her pain is now migrated to the right side of the abdomen.  Physical Exam  BP (!) 147/99 (BP Location: Left Arm)   Pulse 69   Temp 98.4 F (36.9 C) (Oral)   Resp 16   Ht 5\' 3"  (1.6 m)   Wt 127 kg (280 lb)   SpO2 96%   BMI 49.60 kg/m   Physical Exam Patient without any distress at this time. ED Course  Procedures  MDM Dr. Earlene Plateravis reporting that he believes that the patient's appendicitis can be treated with antibiotics alone at this time. Rocephin and Flagyl have been ordered and surgery will follow the patient as a consult. Plan is that the patient does not improve that she will then require surgery.   Myrna BlazerSchaevitz, Salomon Ganser Matthew, MD 02/14/17 1739

## 2017-02-14 NOTE — ED Notes (Signed)
Pt to have ct and iv - ultrasound iv ordered.

## 2017-02-14 NOTE — ED Notes (Signed)
Spoke with surgery = pt will remain npo until reevaluated and then diet with progress as tolerated. Pt is allowed sip of water with her pills.

## 2017-02-14 NOTE — ED Notes (Signed)
Patient is taking a shower at this time. Patient's bed was cleaned and sheets were changed.

## 2017-02-14 NOTE — ED Provider Notes (Signed)
-----------------------------------------   7:06 AM on 02/14/2017 -----------------------------------------   Blood pressure (!) 147/99, pulse 69, temperature 98.4 F (36.9 C), temperature source Oral, resp. rate 16, height 5\' 3"  (1.6 m), weight 127 kg (280 lb), SpO2 96 %.  The patient had no acute events since last update.  Sleeping at this time.  Disposition is pending Psychiatry/Behavioral Medicine team recommendations.     Irean HongSung, Reichen Hutzler J, MD 02/14/17 [phone]

## 2017-02-14 NOTE — ED Notes (Signed)
Pt denies needs. ivf infusing without difficulty, no s/s of infiltration to iv site. Pt states she does have abd pain but then it is better "than it was". NPO status again explained to pt who is requesting meal tray at this time. Pt states she was not hungry "until about two hours ago".

## 2017-02-14 NOTE — ED Notes (Signed)
Surgeon in with pt ZO:[id number]:appenditis.

## 2017-02-14 NOTE — ED Notes (Signed)
Patient is sleeping at this time.

## 2017-02-14 NOTE — ED Notes (Signed)
Report from susan.

## 2017-02-14 NOTE — ED Notes (Signed)
Pt had an episode of worsening abd pain. Wanted to try and get up for the bathroom and became dizzy. Vs taken and documented. Pt currently receiving fluids. Surgery called and made aware of the change. Per surgery continue fluids.

## 2017-02-14 NOTE — ED Notes (Signed)
Pt still refusing food or drink saying her stomach burns. Advised she needs to eat to help maintain her sugar and would probably help the burning. Pt still refused.. Dr aware

## 2017-02-14 NOTE — ED Notes (Signed)
Pt BG 65; pt refuses to eat or drink anything, states, "my stomach is burning and I just dont have an appetite."  Pt explained the dangers of a low blood sugar; pt does not seem to comprehend what I am saying; pt with mild MR at baseline.  Will hold Metformin and perform q 1 hour BG checks while pt is not eating.  MD notified.

## 2017-02-14 NOTE — ED Notes (Addendum)
Pt will be treated with antibiotics for possible appendicitis. Rocephin has infused.  Pt made NPO by surgery until unspecified time.

## 2017-02-15 DIAGNOSIS — R1013 Epigastric pain: Secondary | ICD-10-CM | POA: Diagnosis not present

## 2017-02-15 DIAGNOSIS — R10816 Epigastric abdominal tenderness: Secondary | ICD-10-CM | POA: Insufficient documentation

## 2017-02-15 LAB — CBC
HEMATOCRIT: 36.6 % (ref 35.0–47.0)
HEMOGLOBIN: 12.1 g/dL (ref 12.0–16.0)
MCH: 28.1 pg (ref 26.0–34.0)
MCHC: 33.2 g/dL (ref 32.0–36.0)
MCV: 84.8 fL (ref 80.0–100.0)
Platelets: 241 10*3/uL (ref 150–440)
RBC: 4.31 MIL/uL (ref 3.80–5.20)
RDW: 13.1 % (ref 11.5–14.5)
WBC: 3.8 10*3/uL (ref 3.6–11.0)

## 2017-02-15 LAB — GLUCOSE, CAPILLARY
GLUCOSE-CAPILLARY: 92 mg/dL (ref 65–99)
Glucose-Capillary: 125 mg/dL — ABNORMAL HIGH (ref 65–99)
Glucose-Capillary: 70 mg/dL (ref 65–99)
Glucose-Capillary: 89 mg/dL (ref 65–99)
Glucose-Capillary: 92 mg/dL (ref 65–99)

## 2017-02-15 MED ORDER — HALOPERIDOL LACTATE 5 MG/ML IJ SOLN
INTRAMUSCULAR | Status: AC
  Administered 2017-02-15: 5 mg via INTRAMUSCULAR
  Filled 2017-02-15: qty 1

## 2017-02-15 MED ORDER — DIPHENHYDRAMINE HCL 50 MG/ML IJ SOLN: 25.0000 mg | Freq: Once | INTRAMUSCULAR | Status: DC

## 2017-02-15 MED ORDER — CIPROFLOXACIN HCL 500 MG PO TABS
500.0000 mg | ORAL_TABLET | Freq: Two times a day (BID) | ORAL | Status: DC
  Administered 2017-02-16 – 2017-02-22 (×13): 500 mg via ORAL
  Filled 2017-02-15 (×13): qty 1

## 2017-02-15 MED ORDER — LORAZEPAM 2 MG/ML IJ SOLN
2.0000 mg | Freq: Once | INTRAMUSCULAR | Status: AC
  Administered 2017-02-15: 2 mg via INTRAMUSCULAR

## 2017-02-15 MED ORDER — HALOPERIDOL LACTATE 5 MG/ML IJ SOLN
INTRAMUSCULAR | Status: AC
  Filled 2017-02-15: qty 1

## 2017-02-15 MED ORDER — DIPHENHYDRAMINE HCL 50 MG/ML IJ SOLN
INTRAMUSCULAR | Status: AC
  Filled 2017-02-15: qty 1

## 2017-02-15 MED ORDER — LORAZEPAM 2 MG/ML IJ SOLN
INTRAMUSCULAR | Status: AC
  Administered 2017-02-15: 2 mg via INTRAMUSCULAR
  Filled 2017-02-15: qty 1

## 2017-02-15 MED ORDER — ZIPRASIDONE MESYLATE 20 MG IM SOLR
20.0000 mg | Freq: Once | INTRAMUSCULAR | Status: AC
  Administered 2017-02-15: 20 mg via INTRAMUSCULAR

## 2017-02-15 MED ORDER — MIDAZOLAM HCL 2 MG/2ML IJ SOLN
2.0000 mg | Freq: Once | INTRAMUSCULAR | Status: AC
  Administered 2017-02-15: 2 mg via INTRAMUSCULAR

## 2017-02-15 MED ORDER — HALOPERIDOL LACTATE 5 MG/ML IJ SOLN
5.0000 mg | Freq: Once | INTRAMUSCULAR | Status: AC
  Administered 2017-02-15: 5 mg via INTRAMUSCULAR

## 2017-02-15 MED ORDER — METRONIDAZOLE 500 MG PO TABS
500.0000 mg | ORAL_TABLET | Freq: Three times a day (TID) | ORAL | Status: DC
  Administered 2017-02-16 – 2017-02-22 (×18): 500 mg via ORAL
  Filled 2017-02-15 (×20): qty 1

## 2017-02-15 MED ORDER — ZIPRASIDONE MESYLATE 20 MG IM SOLR
INTRAMUSCULAR | Status: AC
  Administered 2017-02-15: 20 mg via INTRAMUSCULAR
  Filled 2017-02-15: qty 20

## 2017-02-15 MED ORDER — MIDAZOLAM HCL 5 MG/5ML IJ SOLN
INTRAMUSCULAR | Status: AC
  Administered 2017-02-15: 2 mg via INTRAMUSCULAR
  Filled 2017-02-15: qty 5

## 2017-02-15 MED ORDER — DIPHENHYDRAMINE HCL 50 MG/ML IJ SOLN
25.0000 mg | Freq: Once | INTRAMUSCULAR | Status: AC
  Administered 2017-02-15: 25 mg via INTRAMUSCULAR

## 2017-02-15 NOTE — Progress Notes (Signed)
Inpatient Diabetes Program Recommendations  AACE/ADA: New Consensus Statement on Inpatient Glycemic Control (2015)  Target Ranges:  Prepandial:   less than 140 mg/dL      Peak postprandial:   less than 180 mg/dL (1-2 hours)      Critically ill patients:  140 - 180 mg/dL   Review of Glycemic Control  Diabetes history: DM 2 Outpatient Diabetes medications: Metformin 500 mg Daily Current orders for Inpatient glycemic control: Metformin 500 mg Daily  Inpatient Diabetes Program Recommendations:    Patient with hypoglycemia in the 60's yesterday morning. Consider d/c ing metformin while on clear liquids. When eating consider reducing metformin dose to 250 mg Daily if wanting to avoid injections.  Thanks,  Christena DeemShannon Jatziry Wechter RN, MSN, [facility] Inpatient Diabetes Coordinator Team Pager [phone] (8a-5p)

## 2017-02-15 NOTE — ED Notes (Signed)
Patient agreed to place shirt on

## 2017-02-15 NOTE — ED Notes (Signed)
Patient in room going to sleep

## 2017-02-15 NOTE — ED Provider Notes (Signed)
-----------------------------------------   7:18 AM on 02/15/2017 -----------------------------------------   Blood pressure 113/68, pulse 84, temperature 98.5 F (36.9 C), temperature source Oral, resp. rate 16, height 5\' 3"  (1.6 m), weight 127 kg (280 lb), SpO2 98 %.  The patient had no acute events since last update.  Calm and cooperative at this time.  Disposition is pending Psychiatry/Behavioral Medicine team recommendations. Patient is receiving antibiotics for appendicitis vital signs improved. Patient's CBC is back and white blood count is decreased. Patient is however complaining of left-sided abdominal pain now similar to what she had before in the middle of the belly I will call surgery and asked them what they think    Arnaldo NatalMalinda, Paul F, MD 02/15/17 [phone]

## 2017-02-15 NOTE — ED Notes (Signed)
Patient in room yelling and cursing still asking for results notified nurse Tina C.

## 2017-02-15 NOTE — ED Notes (Signed)
Pt asked to speak to nurse.  Pt states "yall lied to me yesterday, you told me I had appendicitis and yall lied to me".  I advised we did not lie, but that it was good that she was not going to need surgery.  Pt states "Get the fuck out of here before I knock the goddamn shit out of you."  Dr Darnelle Catalanmalinda notified of pt behavior, orders received for ativan IM.  Charge RN Greg at bedside to administer IM injection.  Pt spit on Greg while he was administering IM med.  Security at bedside. Pt hitting head on side of bed and removed her paper scrubs off and began to eat them.

## 2017-02-15 NOTE — ED Notes (Addendum)
Pt resting in bed in nad at this time.

## 2017-02-15 NOTE — ED Notes (Signed)
Report to tina, rn.  

## 2017-02-15 NOTE — ED Notes (Signed)
Pt informed will check cbc at 0600. Pt verbalizes understanding.

## 2017-02-15 NOTE — ED Notes (Signed)
Pt on stretcher, calmer, closing eyes at this time, resting, in nad.

## 2017-02-15 NOTE — ED Notes (Signed)
Patient in room yelling and cursing to see the doctor ,noified nurse Jesusita Okaina C. and Tina notified Dr. Darnelle CatalanMalinda

## 2017-02-15 NOTE — ED Notes (Signed)
Patient in room sitting on floor behind the door and talking with nurse Aundra MilletMegan

## 2017-02-15 NOTE — ED Notes (Signed)
pt redirected to bed, checked CBG, see results. Pt sleeping on mattress on the floor at this time.

## 2017-02-15 NOTE — ED Notes (Signed)

## 2017-02-15 NOTE — ED Notes (Addendum)
Pt angry, yelling to see nurse, pt threw cup of water out into hallway.  Pt cursing, saying "I am going to kill myself mother fuckers"  Pt also yelling "I am going to kill that white mother fucking bitch.  Pt then wrapped blanket around her neck tightly, security at bedside and asked pt to take off blanket.  Pt screaming, "do not touch me mother fuckers, leave me the hell alone, I will spit on your face!!!!"  Dr York CeriseForbach called to bedside. Orders received.  Pt continues to yell, MusicianGreg RN at bedside and administered meds as ordered.  Security remains at bedside.  Pt took mattress off of her stretcher, stretcher removed from room for pt and staff safety.  Pt then given mattress for floor for safety of pt, and for comfort.  Pt does not have any bed linen at this time due to her attempting to wrap it around her neck and attempt to hurt herself.  Additionally, pt took off her paper scrubs and attempted to chew them up and spit them in the room,  Pt also took off her underwear and put them into her mouth, pt also very gently hitting head against the wall.  Pt with extreme attention seeking behavior all day today.  Spoke with Pt legal guardian Maia PettiesSheneka Hegler.  She verbalized understanding of today's events and reported that this is exactly the behavior that pt displays at home on a daily basis.  Pt sister again reiterates that pt needs change in meds in order to manage at home.

## 2017-02-15 NOTE — ED Notes (Signed)
Dianna edt  In room calming patient down.

## 2017-02-15 NOTE — ED Notes (Signed)
Patient more upset  And loud

## 2017-02-15 NOTE — ED Notes (Signed)
Assisted the patient to the restroom. Patient very unstable, 2 person assist, at this time.

## 2017-02-15 NOTE — BH Assessment (Signed)
Writer spoke with patient to complete an updated assessment. Patient denies SI/HI and AV/H. She states her stomach is hurting and she is refusing to take medications.  Writer spoke with patient's sister/guardian Truddie Coco([phone]), she have spoken with the patient since she have been in the ER. She states the patient have history of saying she is going to kill herself and or have outburst like a two year old, if she doesn't get her way. Patient wasn't with the sister the day she took and overdose of medication. She was with their mother and she is elderly and was unable to get to her in time to stop her. Sister do not have a copy of the patient's IQ scores. She advised Clinical research associatewriter to contact [facility] to get a copy. She also reports, they are in the process of working with [facility] so the patient can start getting services to help with her current behaviors.  Sister is agreement of visiting the patient tomorrow to determine if she is at baseline and if she is okay with the patient been discharged home.  Writer contact [facility] and requested the Psychological with IQ Test Scores.

## 2017-02-15 NOTE — ED Notes (Signed)
Pt states "I would like to see the doctor on the TV"  Advised dr York CeriseForbach of same.  He states to notify TTS, called and spoke with Jerilynn Somalvin who states he will call and speak with Dr. York CeriseForbach.  Pt also reports burning to stomach area, but states "it feels a little better".  Offered pt liquids, pt continues to refuse food.

## 2017-02-15 NOTE — ED Notes (Signed)
Informed dr. Manson PasseyBrown pt is complaining of lightheadedness and abd pain. No new orders other than 0600 cbc received.

## 2017-02-15 NOTE — BH Assessment (Signed)
Referral Information faxed to;   [facility] [phone]), denied due to patient not been in catchment area.   [facility]    [facility]

## 2017-02-15 NOTE — ED Notes (Signed)
Pt calmer at this time.  Pt states she is sorry for her behavior earlier.  Pt states "my stomach really does hurt, I know you don't believe me"  Advised I would let Dr Darnelle CatalanMalinda know she is hurting.  MD made aware.

## 2017-02-15 NOTE — ED Notes (Signed)
Patient in room crying  

## 2017-02-15 NOTE — ED Notes (Signed)
Patient in room hitting head on rails and tearing clothes off and thew cup of soda at wall notified nurse Tina C.

## 2017-02-15 NOTE — ED Notes (Signed)
Patient standing in doorway. 

## 2017-02-15 NOTE — ED Notes (Signed)
Pt crying, demanding to see MD, Dr Darnelle CatalanMalinda aware, pt up to door of room, yelling, cussing at this time.  Orders received from Dr Darnelle Catalanmalinda for meds.

## 2017-02-15 NOTE — ED Notes (Signed)
Patient in room demanding to see her  results from CT notified nurse Tina C.

## 2017-02-15 NOTE — ED Notes (Signed)
Patient refused her lunch tray.

## 2017-02-15 NOTE — Progress Notes (Signed)
CC: Abd pain Subjective: Improvement of abd pain,. Some intermittent pain epigastric area. No fevers, Hungry yesterday. WBC nml Creat ok CT reviewed, equivocal for appendicitis, ( weak call)_ no free air. No abscess  Objective: Vital signs in last 24 hours: Temp:  [98.5 F (36.9 C)-98.8 F (37.1 C)] 98.5 F (36.9 C) (07/23 0619) Pulse Rate:  [84-101] 84 (07/23 0619) Resp:  [16-22] 16 (07/23 0619) BP: (113-139)/(68-93) 113/68 (07/23 0619) SpO2:  [96 %-99 %] 98 % (07/23 0619)    Intake/Output from previous day: 07/22 0701 - 07/23 0700 In: 100 [IV Piggyback:100] Out: -  Intake/Output this shift: No intake/output data recorded.  Physical exam: NAD, awake and alert Chest: no tenderness, no use of acc.muscle.  Abd: soft, NT, no peritonitis, no hernias Ext: well perfused, no edema. Neuro: awake and alert. GCS 15 no motor deficits Psych: not suicidal, not psychotic.  Lab Results: CBC   Recent Labs  02/14/17 1330 02/15/17 0600  WBC 5.1 3.8  HGB 12.6 12.1  HCT 37.8 36.6  PLT 264 241   BMET  Recent Labs  02/12/17 2248 02/14/17 1330  NA 142 139  K 4.0 3.8  CL 112* 109  CO2 21* 20*  GLUCOSE 87 72  BUN 19 19  CREATININE 0.79 0.84  CALCIUM 9.3 9.1   PT/INR No results for input(s): LABPROT, INR in the last 72 hours. ABG No results for input(s): PHART, HCO3 in the last 72 hours.  Invalid input(s): PCO2, PO2  Studies/Results: Ct Abdomen Pelvis W Contrast  Result Date: 02/14/2017 CLINICAL DATA:  Generalized abdominal pain, burning pain, hypertension, diabetes mellitus EXAM: CT ABDOMEN AND PELVIS WITH CONTRAST TECHNIQUE: Multidetector CT imaging of the abdomen and pelvis was performed using the standard protocol following bolus administration of intravenous contrast. Sagittal and coronal MPR images reconstructed from axial data set. CONTRAST:  100 cc Isovue 370 IV.  No oral contrast administered. COMPARISON:  None FINDINGS: Lower chest: Subsegmental atelectasis  RIGHT lower lobe Hepatobiliary: Gallbladder and liver unremarkable Pancreas: Normal appearance Spleen: Normal appearance. Small splenule inferior to splenic hilum. Adrenals/Urinary Tract: Adrenal glands normal appearance Stomach/Bowel: Unremarkable LEFT kidney without mass, calcification or hydronephrosis. Minimally inhomogeneous RIGHT nephrogram versus LEFT, cannot exclude fell pyelonephritis changes. No LEFT hydronephrosis or hydroureter. Bladder unremarkable. Vascular/Lymphatic: Distal appendix appears minimally enlarged and ill defined, cannot exclude distal appendicitis. Colon interposition between liver and diaphragm. Minimally prominent stool in rectum. Stomach and bowel loops otherwise normal appearance for technique. Reproductive: Unremarkable Other: No free air or free fluid.  No hernia. Musculoskeletal: No acute osseous findings. IMPRESSION: Subtle inhomogeneity of the RIGHT nephrogram versus LEFT, cannot exclude pyelonephritis ; correlation with urinalysis recommended. Distal appendix appears enlarged and ill-defined with questionable periappendiceal infiltrative changes, cannot exclude distal appendicitis. Electronically Signed   By: Ulyses SouthwardMark  Boles M.D.   On: 02/14/2017 15:08    Anti-infectives: Anti-infectives    Start     Dose/Rate Route Frequency Ordered Stop   02/15/17 0915  ciprofloxacin (CIPRO) tablet 500 mg     500 mg Oral 2 times daily 02/15/17 0911     02/15/17 0915  metroNIDAZOLE (FLAGYL) tablet 500 mg     500 mg Oral Every 8 hours 02/15/17 0911     02/14/17 1800  metroNIDAZOLE (FLAGYL) IVPB 500 mg  Status:  Discontinued     500 mg 100 mL/hr over 60 Minutes Intravenous Every 8 hours 02/14/17 1712 02/15/17 0911   02/14/17 1800  cefTRIAXone (ROCEPHIN) 2 g in dextrose 5 % 50 mL IVPB  Status:  Discontinued     2 g 100 mL/hr over 30 Minutes Intravenous Every 24 hours 02/14/17 1740 02/15/17 0911   02/14/17 1715  cefTRIAXone (ROCEPHIN) 1 g in dextrose 5 % 50 mL IVPB  Status:   Discontinued     1 g 100 mL/hr over 30 Minutes Intravenous Every 24 hours 02/14/17 1712 02/14/17 1740      Assessment/Plan: Abdominal pain likely from Medication OD No clinical evidence of appendicitis Heplock ivf Advance to clears Treat empirically w cipro and flagyl PO No surgical intervention D/W Dr. Juliette Alcide in detail  Sterling Big, MD, [facility]  02/15/2017

## 2017-02-15 NOTE — ED Provider Notes (Signed)
-----------------------------------------   3:04 PM on 02/15/2017 -----------------------------------------   Dr. Everlene FarrierPabon reevaluated the patient today continues to feel that her current presentation is not consistent with appendicitis.  Her white blood cell count has come down to 3.8 which is still within the limits but is at the lower end of normal.  She continues to complain of pain intermittently but it is generally the left lower quadrant.  She is on empiric Cipro and Flagyl which is scheduled appropriately.  I have ordered every 4 hour vital sign rechecks to monitor to make sure she does not become tachycardic or febrile, and I have reordered CMP, CBC with differential, and lipase for tomorrow at 5:00 AM.  The patient continues to await psychiatric disposition.    (Note that documentation was delayed due to multiple ED patients requiring immediate care.) Patient acted out violently and aggressively towards ED staff shortly after my original note.  She required redirection by officers and also wrapped a blanket around her throat apparently in an attempt to injure herself.  She required Geodon 20 mg IM and Versed 2 mg IM.  After  that she slept for a short period of time but shortly thereafter she was again awake and ambulatory although slightly somnolent.  No acute events at this time (9:47 PM)    Loleta RoseForbach, Baron Parmelee, MD 02/15/17 2147

## 2017-02-15 NOTE — ED Notes (Signed)
Patient call Megan Okaina C. In room nurse Inetta Fermoina talking with patient being loud and telling her she going to hurt her and kill her

## 2017-02-15 NOTE — ED Notes (Signed)
Pt asking for IV to be removed, went to assess pt and talk to her.  Pt then states she doesn't want her IV to be taken out.  Pt yelling and aggressive towards this RN, security called to bedside.  Pt then took IV out herself.  Pt holding pressure to site.  No bleeding noted, no swelling or infiltration noted.  Security at bedside.  Pt yelling and verbally abusive.  Calling this RN a "bitch"  Spoke with dr Darnelle Catalanmalinda regarding pt behavior, received order for IM haldol.  Pt refusing meds at this time, after much discussion with pt and security, pt agreed to take IM injection from [employer] RN Greg.  Pt given IM injection without incident. See MAR for administration.

## 2017-02-15 NOTE — ED Notes (Signed)
Pt ambulatory to restroom at this time. 

## 2017-02-15 NOTE — BH Assessment (Signed)
Per the direction of Dr. Lucianne MussKumar, from the [facility], writer informed the ER MD (Dr. Darnelle CatalanMalinda), the patient will not be considered for inpatient until the appendicitis have been cleared. "She will need to be medically stable."

## 2017-02-15 NOTE — BH Assessment (Signed)
Per the request of Dr. Scherrie GerlachJason E. Earlene Plateravis, Clinical research associatewriter spoke with him in regards to the patient's deposition. MD shared options to address possible appendicitis. Instead of surgery, he will treat it with antibiotics. Writer informed him, patient will not be consider at a facility until she is medically stable. She would have finish whatever treatment for the appendicitis before consideration. As well as facilities, that work with MR population is limited, which is another barrier to current recommendation for Psychiatric Hospitalization.

## 2017-02-15 NOTE — ED Notes (Addendum)
Pt complaining of abd pain, light headedness

## 2017-02-15 NOTE — ED Notes (Signed)
Patient in room with shirt off refusing to put on another one

## 2017-02-15 NOTE — ED Notes (Signed)
Surgeon at bedside.  Per Dr Everlene FarrierPabon, pt can transition to clear liquid diet and advance today as tolerated.  Pt alert and oriented.  Dr Everlene FarrierPabon spoke with pt and explained that pt does not need surgery today and that he does not believe she has appendicitis.  Pt verbalized understanding.  Pt tolerated po meds and water.

## 2017-02-15 NOTE — ED Notes (Signed)
Patient trying to take IV out nurse Tina in room with patient at this time.

## 2017-02-15 NOTE — ED Notes (Signed)
Pt laying down on floor at this time. Sleeping, breathing easy and unlabored.

## 2017-02-15 NOTE — ED Notes (Signed)
Patient blood sugar was 92 notified nurse Tina C.

## 2017-02-15 NOTE — ED Notes (Signed)
Patient in room watching tv 

## 2017-02-16 DIAGNOSIS — R1084 Generalized abdominal pain: Secondary | ICD-10-CM | POA: Insufficient documentation

## 2017-02-16 LAB — GLUCOSE, CAPILLARY
GLUCOSE-CAPILLARY: 73 mg/dL (ref 65–99)
GLUCOSE-CAPILLARY: 73 mg/dL (ref 65–99)
Glucose-Capillary: 80 mg/dL (ref 65–99)

## 2017-02-16 LAB — CBC WITH DIFFERENTIAL/PLATELET
BASOS ABS: 0 10*3/uL (ref 0–0.1)
Basophils Relative: 1 %
EOS PCT: 3 %
Eosinophils Absolute: 0.1 10*3/uL (ref 0–0.7)
HEMATOCRIT: 38 % (ref 35.0–47.0)
Hemoglobin: 12.8 g/dL (ref 12.0–16.0)
LYMPHS ABS: 1.2 10*3/uL (ref 1.0–3.6)
LYMPHS PCT: 31 %
MCH: 28 pg (ref 26.0–34.0)
MCHC: 33.6 g/dL (ref 32.0–36.0)
MCV: 83.2 fL (ref 80.0–100.0)
Monocytes Absolute: 0.4 10*3/uL (ref 0.2–0.9)
Monocytes Relative: 10 %
NEUTROS ABS: 2.2 10*3/uL (ref 1.4–6.5)
Neutrophils Relative %: 55 %
Platelets: 258 10*3/uL (ref 150–440)
RBC: 4.56 MIL/uL (ref 3.80–5.20)
RDW: 13.5 % (ref 11.5–14.5)
WBC: 3.9 10*3/uL (ref 3.6–11.0)

## 2017-02-16 LAB — COMPREHENSIVE METABOLIC PANEL
ALBUMIN: 4.2 g/dL (ref 3.5–5.0)
ALT: 15 U/L (ref 14–54)
ANION GAP: 10 (ref 5–15)
AST: 26 U/L (ref 15–41)
Alkaline Phosphatase: 64 U/L (ref 38–126)
BILIRUBIN TOTAL: 0.6 mg/dL (ref 0.3–1.2)
BUN: 15 mg/dL (ref 6–20)
CHLORIDE: 109 mmol/L (ref 101–111)
CO2: 21 mmol/L — ABNORMAL LOW (ref 22–32)
Calcium: 8.9 mg/dL (ref 8.9–10.3)
Creatinine, Ser: 0.8 mg/dL (ref 0.44–1.00)
GFR calc Af Amer: >60 mL/min (ref >60)
GLUCOSE: 110 mg/dL — AB (ref 65–99)
Potassium: 3.8 mmol/L (ref 3.5–5.1)
Sodium: 140 mmol/L (ref 135–145)
TOTAL PROTEIN: 7.4 g/dL (ref 6.5–8.1)

## 2017-02-16 LAB — LIPASE, BLOOD: Lipase: 41 U/L (ref 11–51)

## 2017-02-16 NOTE — ED Notes (Signed)

## 2017-02-16 NOTE — ED Notes (Signed)

## 2017-02-16 NOTE — ED Notes (Signed)
Unsuccessful straight stick x2, asked lab to come collect when they have the opportunity

## 2017-02-16 NOTE — ED Notes (Signed)
This RN called patients sister and spoke with her about patient refusing to take meds. Sister spoke with patient and asked her to take her meds but she still refused. Patient stated "I have taken medicines since I was 17 and I am tired of taking them" This RN explained how beneficial taking her medicines are and she still refused and just asked for water. Water given to patient. This is my third attempt to get patient to take medicine

## 2017-02-16 NOTE — ED Notes (Signed)
Patient refused to let this RN or tech Lyla SonCarrie take her blood sugar for 0800

## 2017-02-16 NOTE — ED Provider Notes (Signed)
-----------------------------------------   6:40 AM on 02/16/2017 -----------------------------------------   Blood pressure 136/82, pulse (!) 107, temperature 98.4 F (36.9 C), temperature source Oral, resp. rate 20, height 5\' 3"  (1.6 m), weight 127 kg (280 lb), SpO2 95 %.  The patient had no acute events since last update.  Sleeping at this time.  Nursing to draw morning labs. Disposition is pending Psychiatry/Behavioral Medicine team recommendations.     Irean HongSung, Kiyomi Pallo J, MD 02/16/17 [phone]

## 2017-02-16 NOTE — ED Notes (Signed)
Patient assigned to appropriate care area. Patient oriented to unit/care area: Informed that, for their safety, care areas are designed for safety and monitored by security cameras at all times; and visiting hours explained to patient. Patient verbalizes understanding, and verbal contract for safety obtained. 

## 2017-02-16 NOTE — Progress Notes (Signed)
CC: Abd pain Subjective: Main issue is psychosis Ate sandwich, crackers and chips. NO evidence of emesis. She c/o some abd pain. She had a sound night of sleep  Objective: Vital signs in last 24 hours: Temp:  [98.4 F (36.9 C)-98.5 F (36.9 C)] 98.4 F (36.9 C) (07/23 2214) Pulse Rate:  [97-107] 107 (07/24 0635) Resp:  [16-20] 20 (07/24 0635) BP: (126-138)/(78-85) 136/82 (07/24 0635) SpO2:  [95 %-98 %] 95 % (07/24 0635)    Intake/Output from previous day: No intake/output data recorded. Intake/Output this shift: No intake/output data recorded.  Physical exam: NAD, awake and alert Abd: soft, NT , no peritonitis Neuro: Awake and alert . GCS 15 no motor deficits, follows commands Lab Results: CBC   Recent Labs  02/15/17 0600 02/16/17 0636  WBC 3.8 3.9  HGB 12.1 12.8  HCT 36.6 38.0  PLT 241 258   BMET  Recent Labs  02/14/17 1330 02/16/17 0636  NA 139 140  K 3.8 3.8  CL 109 109  CO2 20* 21*  GLUCOSE 72 110*  BUN 19 15  CREATININE 0.84 0.80  CALCIUM 9.1 8.9   PT/INR No results for input(s): LABPROT, INR in the last 72 hours. ABG No results for input(s): PHART, HCO3 in the last 72 hours.  Invalid input(s): PCO2, PO2  Studies/Results: Ct Abdomen Pelvis W Contrast  Result Date: 02/14/2017 CLINICAL DATA:  Generalized abdominal pain, burning pain, hypertension, diabetes mellitus EXAM: CT ABDOMEN AND PELVIS WITH CONTRAST TECHNIQUE: Multidetector CT imaging of the abdomen and pelvis was performed using the standard protocol following bolus administration of intravenous contrast. Sagittal and coronal MPR images reconstructed from axial data set. CONTRAST:  100 cc Isovue 370 IV.  No oral contrast administered. COMPARISON:  None FINDINGS: Lower chest: Subsegmental atelectasis RIGHT lower lobe Hepatobiliary: Gallbladder and liver unremarkable Pancreas: Normal appearance Spleen: Normal appearance. Small splenule inferior to splenic hilum. Adrenals/Urinary Tract: Adrenal  glands normal appearance Stomach/Bowel: Unremarkable LEFT kidney without mass, calcification or hydronephrosis. Minimally inhomogeneous RIGHT nephrogram versus LEFT, cannot exclude fell pyelonephritis changes. No LEFT hydronephrosis or hydroureter. Bladder unremarkable. Vascular/Lymphatic: Distal appendix appears minimally enlarged and ill defined, cannot exclude distal appendicitis. Colon interposition between liver and diaphragm. Minimally prominent stool in rectum. Stomach and bowel loops otherwise normal appearance for technique. Reproductive: Unremarkable Other: No free air or free fluid.  No hernia. Musculoskeletal: No acute osseous findings. IMPRESSION: Subtle inhomogeneity of the RIGHT nephrogram versus LEFT, cannot exclude pyelonephritis ; correlation with urinalysis recommended. Distal appendix appears enlarged and ill-defined with questionable periappendiceal infiltrative changes, cannot exclude distal appendicitis. Electronically Signed   By: Ulyses SouthwardMark  Boles M.D.   On: 02/14/2017 15:08    Anti-infectives: Anti-infectives    Start     Dose/Rate Route Frequency Ordered Stop   02/15/17 1400  metroNIDAZOLE (FLAGYL) tablet 500 mg     500 mg Oral Every 8 hours 02/15/17 0911     02/15/17 0915  ciprofloxacin (CIPRO) tablet 500 mg     500 mg Oral 2 times daily 02/15/17 0911     02/14/17 1800  metroNIDAZOLE (FLAGYL) IVPB 500 mg  Status:  Discontinued     500 mg 100 mL/hr over 60 Minutes Intravenous Every 8 hours 02/14/17 1712 02/15/17 0911   02/14/17 1800  cefTRIAXone (ROCEPHIN) 2 g in dextrose 5 % 50 mL IVPB  Status:  Discontinued     2 g 100 mL/hr over 30 Minutes Intravenous Every 24 hours 02/14/17 1740 02/15/17 0911   02/14/17 1715  cefTRIAXone (ROCEPHIN) 1  g in dextrose 5 % 50 mL IVPB  Status:  Discontinued     1 g 100 mL/hr over 30 Minutes Intravenous Every 24 hours 02/14/17 1712 02/14/17 1740      Assessment/Plan: Abd pain non specific , no clinical evidence of appendicitis. We will sign  off . No surgical intervention required.  Sterling Big, MD, [facility]  02/16/2017

## 2017-02-16 NOTE — ED Notes (Signed)
Pt still refusing to eat or drink anything other than ice water

## 2017-02-16 NOTE — ED Notes (Signed)
Pt educated that she needs to eat and cannot keep drinking just water due to it decreasing her blood sugar levels.  Pt refusing to eat.  Pt offered juice, sprite and ginger ale to drink in place of water.  Pt refusing at this time.

## 2017-02-16 NOTE — ED Notes (Signed)
ED BHU PLACEMENT JUSTIFICATION Is the patient under IVC or is there intent for IVC: Yes.   Is the patient medically cleared: Yes.   Is there vacancy in the ED BHU: Yes.   Is the population mix appropriate for patient: Yes.   Is the patient awaiting placement in inpatient or outpatient setting: Yes.   Has the patient had a psychiatric consult: Yes.   Survey of unit performed for contraband, proper placement and condition of furniture, tampering with fixtures in bathroom, shower, and each patient room: Yes.  ; Findings: Environment secure.  APPEARANCE/BEHAVIOR calm, cooperative and adequate rapport can be established NEURO ASSESSMENT Orientation: time, place and person Hallucinations: No.None noted (Hallucinations) Speech: Normal Gait: normal RESPIRATORY ASSESSMENT Normal expansion.  Clear to auscultation.  No rales, rhonchi, or wheezing., No chest wall tenderness., No kyphosis or scoliosis. CARDIOVASCULAR ASSESSMENT regular rate and rhythm, S1, S2 normal, no murmur, click, rub or gallop GASTROINTESTINAL ASSESSMENT soft, nontender, BS WNL, no r/g EXTREMITIES normal strength, tone, and muscle mass, no deformities, no erythema, induration, or nodules, no evidence of joint effusion, ROM of all joints is normal, no evidence of joint instability PLAN OF CARE Provide calm/safe environment. Vital signs assessed twice daily. ED BHU Assessment once each 12-hour shift. Collaborate with intake RN daily or as condition indicates. Assure the ED provider has rounded once each shift. Provide and encourage hygiene. Provide redirection as needed. Assess for escalating behavior; address immediately and inform ED provider.  Assess family dynamic and appropriateness for visitation as needed: No.; If necessary, describe findings: Family dynamics were not assessed since the family was not present.  Educate the patient/family about BHU procedures/visitation: Yes.  ; If necessary, describe findings: Pt agrees to the  rules.

## 2017-02-16 NOTE — ED Notes (Signed)
Patients sister left on her lunch break from work to come visit and get patient to take meds. Medicines all given off schedule due to patient refusing earlier. Patient took medicines when sister arrived

## 2017-02-16 NOTE — BH Assessment (Signed)
Writer followed up with referrals   [phone])-Writer called and left a HIPPA Compliant message, requesting a return phone call.   [facility] ([phone]), MR/IDD Hospital is currently full.   [facility] [phone]), Was advised to call back. The person who review the referrals with a patient.  Spoke with Patient's Sister Clinical research associateWriter spoke with patients sister Truddie Coco(Sheneka) following her visit. Patient states, the patient was not at baseline. She reports the patient is happier and laugh more. During her visit she did not witnessed this. Writer informed her, he was going to follow up with [facility] in regards to her records.  Writer followed up with [facility] in regards to MR/IDD Testing Scores and they report they do not have any on file.  Unable to complete a [facility] Referral, because the patient; 1.) Does NOT meet the Exception Criteria set by the state (criteria listed below).  2) There are no available records, indicating the patient diagnosis of MR/IDD and IQ Score, specifically "Psychological Testing." [facility] will not accept MD notes from hospital stating patient have diagnosis of MR/IDD. They are requesting proof as indicated by measuring tools, tests and screenings used to determine the score.  EXCEPTION CRITERIA In the event a consumer known or reasonably believed to have mental retardation and a co-occurring mental illness is transported to a State facility for the mentally ill, that consumer shall not be admitted to that facility except as follows: (1)Persons described in G.S. 122C-266(b), i.e., HB-95; (court committed persons who have been charged with a violent crime and have been found incapable to proceed to trial).   (2)Persons admitted pursuant to G.S. 15A-1321, i.e., SB-43; (court committed persons who have been found not guilty by reason of insanity). (3) Respondents who are so extremely dangerous as to pose a serious threat to others in the community or to other patients  in community hospitals, as determined by the Division.   (4) Respondents who are so gravely disabled by both multiple disorders and medical fragility or multiple disorders and deafness that alternative care is inappropriate, as determined by the Division.

## 2017-02-16 NOTE — ED Notes (Signed)
Patient is refusing to take scheduled medicines

## 2017-02-17 LAB — GLUCOSE, CAPILLARY
GLUCOSE-CAPILLARY: 94 mg/dL (ref 65–99)
Glucose-Capillary: 78 mg/dL (ref 65–99)
Glucose-Capillary: 66 mg/dL (ref 65–99)
Glucose-Capillary: 70 mg/dL (ref 65–99)

## 2017-02-17 MED ORDER — ACETAMINOPHEN 325 MG PO TABS
ORAL_TABLET | ORAL | Status: AC
  Filled 2017-02-17: qty 2

## 2017-02-17 MED ORDER — ACETAMINOPHEN 325 MG PO TABS
650.0000 mg | ORAL_TABLET | Freq: Once | ORAL | Status: AC
  Administered 2017-02-17: 650 mg via ORAL

## 2017-02-17 NOTE — BH Assessment (Signed)
[facility] by [facility] due to being out of their catchment area. Clinician spoke with representative Elita Quick(Pam) and inquired about special Medicaid excerption where pt's are approved for out of catchment treatment if there are no providers available in their area. Representative not aware of exception. As noted in chart, [facility] due to lack of IQ scores/evaluation. 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

## 2017-02-17 NOTE — ED Notes (Signed)

## 2017-02-17 NOTE — ED Notes (Signed)
Pt requested to have her BG checked. Pt has been refusing to eat or drink anything other than water. The previous nurse state that the Pt has been displaying attention seeking behavior and her not wanting to eat  is being done with the intent of lowering her BG level.

## 2017-02-17 NOTE — ED Notes (Signed)
She ate 100% of her meal - reports  "I feel better since I have eaten"  Phone provided so that she may call her sister and grandmother  NAD observed  Continue to monitor

## 2017-02-17 NOTE — ED Notes (Signed)
Patient was given a cup of ice. 

## 2017-02-17 NOTE — ED Notes (Signed)
ED BHU PLACEMENT JUSTIFICATION Is the patient under IVC or is there intent for IVC: Yes.   Is the patient medically cleared: Yes.   Is there vacancy in the ED BHU: Yes.   Is the population mix appropriate for patient: Yes.   Is the patient awaiting placement in inpatient or outpatient setting: Yes.   Has the patient had a psychiatric consult: Yes.   Survey of unit performed for contraband, proper placement and condition of furniture, tampering with fixtures in bathroom, shower, and each patient room: Yes.  ; Findings: Environment secure.  APPEARANCE/BEHAVIOR calm, cooperative and adequate rapport can be established NEURO ASSESSMENT Orientation: time, place and person Hallucinations: No.None noted (Hallucinations) Speech: Normal Gait: normal RESPIRATORY ASSESSMENT Normal expansion.  Clear to auscultation.  No rales, rhonchi, or wheezing., No chest wall tenderness., No kyphosis or scoliosis. CARDIOVASCULAR ASSESSMENT regular rate and rhythm, S1, S2 normal, no murmur, click, rub or gallop GASTROINTESTINAL ASSESSMENT soft, nontender, BS WNL, no r/g EXTREMITIES normal strength, tone, and muscle mass, no deformities, no erythema, induration, or nodules, no evidence of joint effusion, ROM of all joints is normal, no evidence of joint instability PLAN OF CARE Provide calm/safe environment. Vital signs assessed twice daily. ED BHU Assessment once each 12-hour shift. Collaborate with intake RN daily or as condition indicates. Assure the ED provider has rounded once each shift. Provide and encourage hygiene. Provide redirection as needed. Assess for escalating behavior; address immediately and inform ED provider.  Assess family dynamic and appropriateness for visitation as needed: Yes.  ; If necessary, describe findings: According to previous nurse family is involved and attentive to Pt's needs.  Educate the patient/family about BHU procedures/visitation: Yes.  ; If necessary, describe findings: Pt  agrees to follow the rules.

## 2017-02-17 NOTE — ED Notes (Signed)
BEHAVIORAL HEALTH ROUNDING Patient sleeping: No. Patient alert and oriented: yes Behavior appropriate: Yes.  ; If no, describe:  Nutrition and fluids offered: yes Toileting and hygiene offered: Yes  Sitter present: q15 minute observations and security  monitoring Law enforcement present: Yes  ODS  

## 2017-02-17 NOTE — ED Notes (Signed)
Pt ambulated to the bathroom to void. Pt returned to her room without difficulty.  

## 2017-02-17 NOTE — ED Notes (Signed)
Patient in bathroom

## 2017-02-17 NOTE — ED Notes (Signed)
Patient on phone  

## 2017-02-17 NOTE — ED Notes (Signed)
BEHAVIORAL HEALTH ROUNDING Patient sleeping: Yes.   Patient alert and oriented: eyes closed  Appears to be asleep Behavior appropriate: Yes.  ; If no, describe:  Nutrition and fluids offered: Yes  Toileting and hygiene offered: sleeping Sitter present: q 15 minute observations and security monitoring Law enforcement present: yes  ODS 

## 2017-02-17 NOTE — ED Notes (Signed)
Pt ask if she starts eating if she would be able to go home. This tech informed pt she would need to talk with her doctor about that. Pt given TV remote.

## 2017-02-17 NOTE — BH Assessment (Signed)
Clinician [facility] up with [facility] Per Ess[facility], pt was declined due to medical acuity.   [facility]in[facility] but, was unable to reach a representative.   TTS will continue to seek placement.

## 2017-02-17 NOTE — ED Notes (Addendum)
Patient visualized lying in bed with eyes closed  Even, unlabored respirations observed   NAD  Continue to monitor

## 2017-02-17 NOTE — Progress Notes (Signed)
Inpatient Diabetes Program Recommendations  AACE/ADA: New Consensus Statement on Inpatient Glycemic Control (2015)  Target Ranges:  Prepandial:   less than 140 mg/dL      Peak postprandial:   less than 180 mg/dL (1-2 hours)      Critically ill patients:  140 - 180 mg/dL   Review of Glycemic Control  Diabetes history: DM 2 Outpatient Diabetes medications: Metformin 500 mg Daily Current orders for Inpatient glycemic control: Metformin 500 mg Daily  Inpatient Diabetes Program Recommendations:    Patient with hypoglycemia in the 60's this morning. Consider d/c ing metformin while on clear liquids. When eating consider reducing metformin dose to 250 mg Daily if wanting to avoid injections.  Thanks,  Christena DeemShannon Yigit Norkus RN, MSN, [facility] Inpatient Diabetes Coordinator Team Pager [phone] (8a-5p)

## 2017-02-17 NOTE — ED Notes (Signed)
ED  Is the patient under IVC or is there intent for IVC: Yes.   Is the patient medically cleared: Yes.   Is there vacancy in the ED BHU: Yes.   Is the population mix appropriate for patient: Yes.   Is the patient awaiting placement in inpatient or outpatient setting: Yes - referrals by TTS   Has the patient had a psychiatric consult: Yes.   Survey of unit performed for contraband, proper placement and condition of furniture, tampering with fixtures in bathroom, shower, and each patient room: Yes.  ; Findings:  APPEARANCE/BEHAVIOR Cooperative -  NEURO ASSESSMENT Orientation: oriented to self place situation  Hallucinations: No  Denies  None noted (Hallucinations) Speech: Normal Gait: normal RESPIRATORY ASSESSMENT Even  Unlabored respirations  CARDIOVASCULAR ASSESSMENT Pulses equal   regular rate  Skin warm and dry   GASTROINTESTINAL ASSESSMENT no GI complaint EXTREMITIES Full ROM  PLAN OF CARE Provide calm/safe environment. Vital signs assessed twice daily. ED BHU Assessment once each 12-hour shift. Collaborate with TTS  daily or as condition indicates. Assure the ED provider has rounded once each shift. Provide and encourage hygiene. Provide redirection as needed. Assess for escalating behavior; address immediately and inform ED provider.  Assess family dynamic and appropriateness for visitation as needed: Yes.  ; If necessary, describe findings:  Educate the patient/family about BHU procedures/visitation: Yes.  ; If necessary, describe findings:

## 2017-02-17 NOTE — ED Notes (Signed)
Patient was given a cup of water. 

## 2017-02-17 NOTE — BH Assessment (Addendum)
[facility].

## 2017-02-17 NOTE — ED Notes (Signed)
Pt ambulated to the bathroom to void. Pt returned to her bedroom without difficulty.

## 2017-02-17 NOTE — ED Notes (Signed)
Pt given graham crackers and sprite. Pt refused the sprite. Pt offered peanut butter but stated she doesn't eat it.

## 2017-02-17 NOTE — ED Provider Notes (Signed)
-----------------------------------------   7:03 AM on 02/17/2017 -----------------------------------------   Blood pressure (!) 144/82, pulse 85, temperature 98 F (36.7 C), temperature source Oral, resp. rate 18, height 5\' 3"  (1.6 m), weight 127 kg (280 lb), SpO2 97 %.  The patient had no acute events since last update.  Calm and cooperative at this time.  The patient has been referred to multiple facilities and is awaiting acceptance.     Rebecka ApleyWebster, Anthonymichael Munday P, MD 02/17/17 [phone]

## 2017-02-17 NOTE — ED Notes (Signed)
Patient in shower 

## 2017-02-17 NOTE — ED Notes (Signed)
Patient assigned to appropriate care area. Patient oriented to unit/care area: Informed that, for their safety, care areas are designed for safety and monitored by security cameras at all times; and visiting hours explained to patient. Patient verbalizes understanding, and verbal contract for safety obtained. 

## 2017-02-17 NOTE — ED Notes (Signed)
Patient requesting to see nurse and using the  phone explain to patient phone times and rules, patient not happy at this time., notified nurse Amy T. patient wants to see her.

## 2017-02-17 NOTE — ED Notes (Signed)
Pt given supper tray.

## 2017-02-18 LAB — URINALYSIS, COMPLETE (UACMP) WITH MICROSCOPIC
Bilirubin Urine: NEGATIVE
Glucose, UA: NEGATIVE mg/dL
Hgb urine dipstick: NEGATIVE
KETONES UR: 5 mg/dL — AB
Nitrite: NEGATIVE
PH: 6 (ref 5.0–8.0)
PROTEIN: NEGATIVE mg/dL
Specific Gravity, Urine: 1.028 (ref 1.005–1.030)

## 2017-02-18 LAB — POCT PREGNANCY, URINE: Preg Test, Ur: NEGATIVE

## 2017-02-18 LAB — GLUCOSE, CAPILLARY
GLUCOSE-CAPILLARY: 114 mg/dL — AB (ref 65–99)
GLUCOSE-CAPILLARY: 83 mg/dL (ref 65–99)

## 2017-02-18 LAB — HCG, QUANTITATIVE, PREGNANCY: hCG, Beta Chain, Quant, S: <1 m[IU]/mL (ref <5)

## 2017-02-18 NOTE — ED Notes (Signed)
ED Tech Lafonda MossesDiana reported that pt is c/o burning and pain with urination and also that bilat nipples are "leaking" - per tech the leakage was clear to milky in color and thin in consistency - this was reported to Dr Darnelle CatalanMalinda - see new orders for ua and urine poc - pt instructed on how to obtain sample and she verbalized understanding

## 2017-02-18 NOTE — ED Notes (Signed)
Patient refused to check blood sugar.

## 2017-02-18 NOTE — ED Notes (Signed)
Patient refused vsigns 

## 2017-02-18 NOTE — ED Notes (Addendum)
Pt states that she is not taking anymore medication or eating anymore food - calling this nurse a "white cracker" and stating that she is going to "esacpe from this place"

## 2017-02-18 NOTE — ED Notes (Signed)
Juice was given to patient.

## 2017-02-18 NOTE — ED Notes (Signed)
IVC/Pending placement/ Renewal needed on 02/19/17

## 2017-02-18 NOTE — ED Notes (Signed)
Patient refused to checked blood sugar.

## 2017-02-18 NOTE — ED Notes (Signed)
Patient asked for something to drink ,gave patient cup of water patient poured in sink and tossed cup in trash.

## 2017-02-18 NOTE — ED Notes (Signed)
Pt refused breakfast stating that she did not like anything they sent her - offered another tray and she refused

## 2017-02-18 NOTE — ED Notes (Signed)
Patient ate all of the food that came on her dinner tray.

## 2017-02-18 NOTE — ED Provider Notes (Signed)
-----------------------------------------   8:43 AM on 02/18/2017 -----------------------------------------   Blood pressure 133/74, pulse 78, temperature 98.1 F (36.7 C), temperature source Oral, resp. rate 16, height 5\' 3"  (1.6 m), weight 127 kg (280 lb), SpO2 97 %.  The patient had no acute events since last update.  Calm and cooperative at this time.  Disposition is pending Psychiatry/Behavioral Medicine team recommendations.     Rebecka ApleyWebster, Goddess Gebbia P, MD 02/18/17 [phone]

## 2017-02-18 NOTE — ED Notes (Signed)
Patient was given apple juice. 

## 2017-02-18 NOTE — ED Notes (Signed)
Patient asked to check blood sugar , went in room to checked blood sugar patient refuses by putting her head and hands under cover and states I'm not on one's time .

## 2017-02-18 NOTE — ED Notes (Signed)
Patient in room cursing and calling staff names,so explained to patient that her door would have to be closes due to her language and when she stops we can open door,

## 2017-02-18 NOTE — ED Notes (Signed)
Pt ambulated to the bathroom to void. Pt returned to her room without difficulty.  

## 2017-02-18 NOTE — ED Notes (Signed)
Graham crackers and peanut butter was given to patient

## 2017-02-18 NOTE — ED Notes (Signed)
Pt is screaming and yelling and demanding a black nurse because she is demanding juice but pt is diabetic and has already had 4 juices and this nurse informed her that she could not have any other juice until supper or snack time

## 2017-02-18 NOTE — ED Notes (Signed)
Patient now wants her nurse to check her blood sugar ,explain her nurse is busy working on things for another patient, also notified nurse Rosey Batheresa

## 2017-02-18 NOTE — ED Notes (Signed)
Patient in room sleeping at his time

## 2017-02-18 NOTE — ED Notes (Signed)
Unsuccessful attempts made by Eileen StanfordJenna RN and Allayne StackJann NT for procalcitonin blood draw. Lab called and requested to draw blood. Lab tech reported they would send someone to draw sample.

## 2017-02-18 NOTE — ED Notes (Signed)
Patient refused shower. 

## 2017-02-19 LAB — GLUCOSE, CAPILLARY
Glucose-Capillary: 89 mg/dL (ref 65–99)
Glucose-Capillary: 112 mg/dL — ABNORMAL HIGH (ref 65–99)
Glucose-Capillary: 90 mg/dL (ref 65–99)

## 2017-02-19 NOTE — ED Provider Notes (Addendum)
-----------------------------------------   7:20 AM on 02/19/2017 -----------------------------------------   Blood pressure (!) 125/57, pulse 95, temperature 98.6 F (37 C), temperature source Oral, resp. rate 16, height 5\' 3"  (1.6 m), weight 127 kg (280 lb), SpO2 100 %.  The patient had no acute events since last update.  Patient has been referred to various inpatient psychiatric facilities. There has not been accepting facility as of yet. Medical workup is been largely nonrevealing. Initially there was concern for possible tip appendicitis on CT however the patient has been seen by surgery with no clinical signs of appendicitis they've signed off care.   Minna AntisPaduchowski, Terron Merfeld, MD 02/19/17 [phone]  The patient's involuntary commitment papers need to be updated today. As we continue to actively look for placement, with no change in disposition we will renew the patient's ABC paperwork today.    Minna AntisPaduchowski, Adan Baehr, MD 02/19/17 [phone]

## 2017-02-19 NOTE — ED Notes (Signed)
Pt observed with no unusual behavior  Appropriate to stimulation  No verbalized needs or concerns at this time  NAD assessed  Continue to monitor 

## 2017-02-19 NOTE — ED Notes (Signed)
BEHAVIORAL HEALTH ROUNDING Patient sleeping: No. Patient alert and oriented: yes Behavior appropriate: Yes.  ; If no, describe:  Nutrition and fluids offered: yes Toileting and hygiene offered: Yes  Sitter present: q15 minute observations and security  monitoring Law enforcement present: Yes  ODS  

## 2017-02-19 NOTE — ED Notes (Signed)
Am meds to be administered as ordered  Assessment completed

## 2017-02-19 NOTE — ED Notes (Signed)
Pt provided peanut butter, graham crackers, and saltines by this tech

## 2017-02-19 NOTE — ED Notes (Signed)
Patient in shower 

## 2017-02-19 NOTE — BH Assessment (Addendum)
Clinician in process of inquiring about possibility of assessing pt for IQ with department.

## 2017-02-19 NOTE — ED Notes (Signed)

## 2017-02-19 NOTE — ED Notes (Signed)
Patient  In bathroom , patient void.

## 2017-02-19 NOTE — ED Notes (Signed)
Gave patient remote to TV.

## 2017-02-19 NOTE — ED Notes (Signed)
Report from amy, rn.  

## 2017-02-19 NOTE — ED Notes (Signed)
Pt sleeping, resps unlabored.  

## 2017-02-19 NOTE — ED Notes (Signed)
ED  Is the patient under IVC or is there intent for IVC: Yes.   Is the patient medically cleared: Yes.   Is there vacancy in the ED BHU: Yes.   Is the population mix appropriate for patient: Yes.   Is the patient awaiting placement in inpatient or outpatient setting: Yes.   Has the patient had a psychiatric consult: Yes.   Survey of unit performed for contraband, proper placement and condition of furniture, tampering with fixtures in bathroom, shower, and each patient room: Yes.  ; Findings:  APPEARANCE/BEHAVIOR Calm and cooperative NEURO ASSESSMENT Orientation: oriented x3  Denies pain Hallucinations: No. Denies None noted (Hallucinations) Speech: Normal Gait: normal RESPIRATORY ASSESSMENT Even  Unlabored respirations  CARDIOVASCULAR ASSESSMENT Pulses equal   regular rate  Skin warm and dry   GASTROINTESTINAL ASSESSMENT no GI complaint EXTREMITIES Full ROM  PLAN OF CARE Provide calm/safe environment. Vital signs assessed twice daily. ED BHU Assessment once each 12-hour shift. Collaborate with TTS daily or as condition indicates. Assure the ED provider has rounded once each shift. Provide and encourage hygiene. Provide redirection as needed. Assess for escalating behavior; address immediately and inform ED provider.  Assess family dynamic and appropriateness for visitation as needed: Yes.  ; If necessary, describe findings:  Educate the patient/family about BHU procedures/visitation: Yes.  ; If necessary, describe findings:   

## 2017-02-19 NOTE — ED Notes (Signed)
This tech received report from Medic Tammy J 

## 2017-02-20 LAB — WET PREP, GENITAL
CLUE CELLS WET PREP: NONE SEEN
Sperm: NONE SEEN
Trich, Wet Prep: NONE SEEN

## 2017-02-20 LAB — PROLACTIN: PROLACTIN: 29 ng/mL — AB (ref 4.8–23.3)

## 2017-02-20 LAB — CHLAMYDIA/NGC RT PCR (ARMC ONLY)
CHLAMYDIA TR: NOT DETECTED
N GONORRHOEAE: NOT DETECTED

## 2017-02-20 LAB — GLUCOSE, CAPILLARY
GLUCOSE-CAPILLARY: 79 mg/dL (ref 65–99)
Glucose-Capillary: 79 mg/dL (ref 65–99)
Glucose-Capillary: 120 mg/dL — ABNORMAL HIGH (ref 65–99)

## 2017-02-20 MED ORDER — FLUCONAZOLE 100 MG PO TABS
150.0000 mg | ORAL_TABLET | Freq: Once | ORAL | Status: AC
  Administered 2017-02-20: 150 mg via ORAL

## 2017-02-20 MED ORDER — FLUCONAZOLE 100 MG PO TABS
ORAL_TABLET | ORAL | Status: AC
  Filled 2017-02-20: qty 1

## 2017-02-20 MED ORDER — ACETAMINOPHEN 500 MG PO TABS
1000.0000 mg | ORAL_TABLET | Freq: Once | ORAL | Status: AC
  Administered 2017-02-20: 1000 mg via ORAL
  Filled 2017-02-20: qty 2

## 2017-02-20 MED ORDER — FLUCONAZOLE 50 MG PO TABS
ORAL_TABLET | ORAL | Status: AC
  Filled 2017-02-20: qty 1

## 2017-02-20 NOTE — ED Notes (Signed)
Lunch tray given. 

## 2017-02-20 NOTE — ED Notes (Signed)
Pt ok to obtain wet prep and cg/chlamydia swabs on own per dr Don Perkingveronese VORB.  Pt instructed how to swab vagina.  This RN and [place] NT were in room.  Pt obtained both swabs and were sent to lab.

## 2017-02-20 NOTE — ED Notes (Signed)
Pt has been cooperative all morning.  Given phone call to call godmother

## 2017-02-20 NOTE — ED Notes (Signed)
PT IVC/ PENDING PLACEMENT  

## 2017-02-20 NOTE — ED Provider Notes (Signed)
-----------------------------------------   6:13 AM on 02/20/2017 -----------------------------------------   Blood pressure 119/72, pulse 88, temperature 98.4 F (36.9 C), resp. rate 14, height 5\' 3"  (1.6 m), weight 127 kg (280 lb), SpO2 100 %.  The patient had no acute events since last update.  Calm and cooperative at this time.  Disposition is pending Psychiatry/Behavioral Medicine team recommendations.     Myrna BlazerSchaevitz, Katha Kuehne Matthew, MD 02/20/17 [phone]

## 2017-02-20 NOTE — ED Notes (Signed)
Pt was given a midnight snack and drink. Pt is sleeping comfortably in bed

## 2017-02-20 NOTE — ED Notes (Signed)
Given soap and new clothes to take shower this morning.  Ate breakfast and drank juice.

## 2017-02-20 NOTE — ED Notes (Signed)
Bed linen were changed

## 2017-02-20 NOTE — ED Notes (Signed)
Report to valerie, rn.  

## 2017-02-20 NOTE — ED Notes (Addendum)
Sister, HCPOA given password and allowed to visit. Pt relations remains present for visit. Given one apple juice

## 2017-02-21 LAB — GLUCOSE, CAPILLARY
Glucose-Capillary: 91 mg/dL (ref 65–99)
Glucose-Capillary: 129 mg/dL — ABNORMAL HIGH (ref 65–99)
Glucose-Capillary: 98 mg/dL (ref 65–99)

## 2017-02-21 MED ORDER — PHENAZOPYRIDINE HCL 200 MG PO TABS
200.0000 mg | ORAL_TABLET | Freq: Once | ORAL | Status: AC
  Administered 2017-02-21: 200 mg via ORAL
  Filled 2017-02-21: qty 1

## 2017-02-21 MED ORDER — FLUCONAZOLE 50 MG PO TABS
150.0000 mg | ORAL_TABLET | Freq: Once | ORAL | Status: AC
  Administered 2017-02-21: 150 mg via ORAL
  Filled 2017-02-21: qty 1

## 2017-02-21 NOTE — BH Assessment (Signed)
Pt placement referral submitted to [facility]. 

## 2017-02-21 NOTE — ED Notes (Signed)
Report to bill, rn.  

## 2017-02-21 NOTE — ED Notes (Signed)
PT IVC/ PENDING PLACEMENT. AWAITING FACE TO FACE CONSULT

## 2017-02-21 NOTE — ED Notes (Signed)
Report from noel, rn.  

## 2017-02-21 NOTE — ED Notes (Signed)
Pt awake;pt sitting up in bed eating lunch tray; pt given apple juice;pt asked this tech for italian ice explained would let RN know;pt also called mother on the phone at 1300 and sister

## 2017-02-21 NOTE — ED Notes (Signed)
Pt was monitored by myself all day. Pt was calm and cooperative. No behavioral issues.

## 2017-02-21 NOTE — ED Provider Notes (Signed)
-----------------------------------------   7:21 AM on 02/21/2017 -----------------------------------------   Blood pressure 111/67, pulse 79, temperature 98.3 F (36.8 C), resp. rate 14, height 5\' 3"  (1.6 m), weight 127 kg (280 lb), SpO2 96 %.  The patient had no acute events since last update.  Calm and cooperative at this time.  Disposition is pending Psychiatry/Behavioral Medicine team recommendations.     Jeanmarie PlantMcShane, James A, MD 02/21/17 [phone]

## 2017-02-22 ENCOUNTER — Emergency Department: Payer: Medicaid Other

## 2017-02-22 ENCOUNTER — Encounter: Payer: Self-pay | Admitting: Radiology

## 2017-02-22 DIAGNOSIS — F4325 Adjustment disorder with mixed disturbance of emotions and conduct: Secondary | ICD-10-CM

## 2017-02-22 LAB — COMPREHENSIVE METABOLIC PANEL
ALBUMIN: 4.2 g/dL (ref 3.5–5.0)
ALT: 16 U/L (ref 14–54)
AST: 24 U/L (ref 15–41)
Alkaline Phosphatase: 54 U/L (ref 38–126)
Anion gap: 5 (ref 5–15)
BILIRUBIN TOTAL: 0.4 mg/dL (ref 0.3–1.2)
BUN: 12 mg/dL (ref 6–20)
CHLORIDE: 110 mmol/L (ref 101–111)
CO2: 24 mmol/L (ref 22–32)
Calcium: 9.1 mg/dL (ref 8.9–10.3)
Creatinine, Ser: 0.76 mg/dL (ref 0.44–1.00)
GFR calc Af Amer: >60 mL/min (ref >60)
GFR calc non Af Amer: >60 mL/min (ref >60)
GLUCOSE: 96 mg/dL (ref 65–99)
POTASSIUM: 4.2 mmol/L (ref 3.5–5.1)
SODIUM: 139 mmol/L (ref 135–145)
TOTAL PROTEIN: 7.2 g/dL (ref 6.5–8.1)

## 2017-02-22 LAB — CBC WITH DIFFERENTIAL/PLATELET
BASOS ABS: 0 10*3/uL (ref 0–0.1)
BASOS PCT: 1 %
EOS ABS: 0.1 10*3/uL (ref 0–0.7)
Eosinophils Relative: 3 %
HEMATOCRIT: 37.5 % (ref 35.0–47.0)
Hemoglobin: 12.6 g/dL (ref 12.0–16.0)
Lymphocytes Relative: 42 %
Lymphs Abs: 2.1 10*3/uL (ref 1.0–3.6)
MCH: 27.9 pg (ref 26.0–34.0)
MCHC: 33.7 g/dL (ref 32.0–36.0)
MCV: 82.9 fL (ref 80.0–100.0)
MONO ABS: 0.4 10*3/uL (ref 0.2–0.9)
Monocytes Relative: 8 %
NEUTROS ABS: 2.4 10*3/uL (ref 1.4–6.5)
NEUTROS PCT: 46 %
Platelets: 283 10*3/uL (ref 150–440)
RBC: 4.53 MIL/uL (ref 3.80–5.20)
RDW: 13.9 % (ref 11.5–14.5)
WBC: 5 10*3/uL (ref 3.6–11.0)

## 2017-02-22 LAB — GLUCOSE, CAPILLARY
Glucose-Capillary: 90 mg/dL (ref 65–99)
Glucose-Capillary: 83 mg/dL (ref 65–99)

## 2017-02-22 MED ORDER — ACETAMINOPHEN 500 MG PO TABS
1000.0000 mg | ORAL_TABLET | Freq: Once | ORAL | Status: AC
  Administered 2017-02-22: 1000 mg via ORAL
  Filled 2017-02-22: qty 2

## 2017-02-22 MED ORDER — GI COCKTAIL ~~LOC~~
30.0000 mL | Freq: Once | ORAL | Status: AC
  Administered 2017-02-22: 30 mL via ORAL

## 2017-02-22 MED ORDER — IOPAMIDOL (ISOVUE-300) INJECTION 61%
100.0000 mL | Freq: Once | INTRAVENOUS | Status: AC | PRN
  Administered 2017-02-22: 100 mL via INTRAVENOUS

## 2017-02-22 MED ORDER — GI COCKTAIL ~~LOC~~
ORAL | Status: AC
  Filled 2017-02-22: qty 30

## 2017-02-22 NOTE — ED Provider Notes (Addendum)
-----------------------------------------   12:14 AM on 02/22/2017 -----------------------------------------   Blood pressure 133/86, pulse 84, temperature 98.6 F (37 C), temperature source Oral, resp. rate 16, height 5\' 3"  (1.6 m), weight 127 kg (280 lb), SpO2 100 %.  The patient had no acute events since last update.  Calm and cooperative at this time.  Disposition is pending Psychiatry/Behavioral Medicine team recommendations.     Merrily Brittleifenbark, Frances Ambrosino, MD 02/22/17 [phone]  The patient is now reporting a recurrence of her abdominal pain. History and exam are challenging secondary to her developmental delay as well as her body habitus. She recently had possible appendicitis treated with IV antibiotics at this point given her recurrent pain I believe she warrants a repeat CT scan to ensure resolution of her previous appendicitis.   Angiocath insertion Performed by: Merrily BrittleNeil Cung Masterson  Consent: Verbal consent obtained. Risks and benefits: risks, benefits and alternatives were discussed Time out: Immediately prior to procedure a "time out" was called to verify the correct patient, procedure, equipment, support staff and site/side marked as required.  Preparation: Patient was prepped and draped in the usual sterile fashion.  Vein Location: Left antecubital fossa  Ultrasound Guided  Gauge: 20  Normal blood return and flush without difficulty Patient tolerance: Patient tolerated the procedure well with no immediate complications.      Merrily Brittleifenbark, Renly Guedes, MD 02/22/17 [phone]  CT scan today confirms no appendicitis. She remains medically stable for psychiatric evaluation.   Merrily Brittleifenbark, Rhiannon Sassaman, MD 02/22/17 [phone]    Merrily Brittleifenbark, Gisele Pack, MD 02/22/17 [phone]

## 2017-02-22 NOTE — ED Notes (Signed)
IV removed from pt right AC.

## 2017-02-22 NOTE — ED Notes (Signed)
Attempted to administer her am meds - she reports  "not right now - maybe in a a little while - my stomach hurts."    BEHAVIORAL HEALTH ROUNDING Patient sleeping: No. Patient alert and oriented: yes Behavior appropriate: Yes.  ; If no, describe:  Nutrition and fluids offered: yes Toileting and hygiene offered: Yes  Sitter present: q15 minute observations and security monitoring Law enforcement present: Yes  ODS

## 2017-02-22 NOTE — ED Notes (Signed)
BEHAVIORAL HEALTH ROUNDING Patient sleeping: No. Patient alert and oriented: yes Behavior appropriate: Yes.  ; If no, describe:  Nutrition and fluids offered: yes Toileting and hygiene offered: Yes  Sitter present: q15 minute observations and security  monitoring Law enforcement present: Yes  ODS  

## 2017-02-22 NOTE — ED Notes (Signed)
CBG performed.  

## 2017-02-22 NOTE — ED Notes (Signed)
Pt given lunch tray and refused. Pt did not eat any breakfast. This tech left tray at sink area for pt later.

## 2017-02-22 NOTE — ED Notes (Signed)
Attempt iv insertion x2 without success, jill, rn in to attempt iv initiation.

## 2017-02-22 NOTE — ED Notes (Signed)
Pt complains of generalized abd pain and inability to sleep due to pain. Dr. Lamont Snowballrifenbark in to assess.

## 2017-02-22 NOTE — ED Notes (Signed)
Report from noel, rn.  

## 2017-02-22 NOTE — ED Notes (Signed)
Patient observed lying in bed with eyes closed  Even, unlabored respirations observed   NAD  I will continue to monitor along with every 15 minute visual observations and ongoing security monitoring    
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
## 2017-02-22 NOTE — ED Notes (Signed)
Head of bed lowered. Pt refused shower. Pt stated she took a shower yesterday. This tech told pt I didn't see any notes about her taking a shower yesterday and pt said she really did and doesn't want to take one today because she doesn't feel well.

## 2017-02-22 NOTE — ED Notes (Signed)
Dawn, rn, lea, rn and jill, rn attempted without success to initiate iv. Dr. Lamont Snowballrifenbark notified.

## 2017-02-22 NOTE — ED Notes (Signed)
Resumed care from Coy Vandoren t rn.  Pt alert.  Iv in place.  Pt calm and cooperative.

## 2017-02-22 NOTE — ED Notes (Signed)
Dc inst to pt and sister.  Iv dc'ed.  Pt calm  And cooperative.

## 2017-02-22 NOTE — ED Notes (Signed)
Report to amy, rn

## 2017-02-22 NOTE — ED Notes (Signed)
ED  Is the patient under IVC or is there intent for IVC: Yes.   Is the patient medically cleared: Yes.   Is there vacancy in the ED BHU: Yes.   Is the population mix appropriate for patient: Yes.   Is the patient awaiting placement in inpatient or outpatient setting: Yes.   Has the patient had a psychiatric consult: Yes.   Survey of unit performed for contraband, proper placement and condition of furniture, tampering with fixtures in bathroom, shower, and each patient room: Yes.  ; Findings:  APPEARANCE/BEHAVIOR Calm and cooperative NEURO ASSESSMENT Orientation: oriented x4  Hallucinations: No.denies  None noted (Hallucinations) Speech: Normal Gait: normal RESPIRATORY ASSESSMENT Even  Unlabored respirations  CARDIOVASCULAR ASSESSMENT Pulses equal   regular rate  Skin warm and dry   GASTROINTESTINAL ASSESSMENT no GI complaint EXTREMITIES Full ROM  PLAN OF CARE Provide calm/safe environment. Vital signs assessed twice daily. ED BHU Assessment once each 12-hour shift. Collaborate with TTS daily or as condition indicates. Assure the ED provider has rounded once each shift. Provide and encourage hygiene. Provide redirection as needed. Assess for escalating behavior; address immediately and inform ED provider.  Assess family dynamic and appropriateness for visitation as needed: Yes.  ; If necessary, describe findings:  Educate the patient/family about BHU procedures/visitation: Yes.  ; If necessary, describe findings:

## 2017-02-22 NOTE — ED Notes (Signed)
Pt taking a shower. Pt given shower supplies and clean scrubs. Linens changed and trash removed from room.

## 2017-02-22 NOTE — ED Provider Notes (Signed)
-----------------------------------------   3:45 PM on 02/22/2017 -----------------------------------------  Patient has been seen by psychiatry they believe the patient is safe for discharge home at this time. We have spoken to the patient's sister who will be coming to pick the patient. Patient is medical workup has been largely nonrevealing, her complaints of abdominal pain have been worked up extensively including CT scan and surgery consultation. I believe the patient is safe for discharge home.   Minna AntisPaduchowski, Tashe Purdon, MD 02/22/17 [phone]

## 2017-02-22 NOTE — ED Notes (Signed)

## 2017-02-22 NOTE — ED Notes (Addendum)
Pt has returned to room from shower. Shower supplies returned. Pt given lotion for feet.

## 2017-02-22 NOTE — ED Notes (Signed)
Pt given breakfast tray

## 2017-03-04 ENCOUNTER — Emergency Department
Admission: EM | Admit: 2017-03-04 | Discharge: 2017-03-05 | Disposition: A | Discharge status: Home / Self Care | Payer: No Typology Code available for payment source | Attending: Emergency Medicine | Admitting: Emergency Medicine

## 2017-03-04 ENCOUNTER — Encounter: Payer: Self-pay | Admitting: Emergency Medicine

## 2017-03-04 DIAGNOSIS — E119 Type 2 diabetes mellitus without complications: Secondary | ICD-10-CM | POA: Insufficient documentation

## 2017-03-04 DIAGNOSIS — M25561 Pain in right knee: Secondary | ICD-10-CM | POA: Diagnosis not present

## 2017-03-04 DIAGNOSIS — F603 Borderline personality disorder: Secondary | ICD-10-CM | POA: Diagnosis not present

## 2017-03-04 DIAGNOSIS — B349 Viral infection, unspecified: Secondary | ICD-10-CM | POA: Diagnosis not present

## 2017-03-04 DIAGNOSIS — F79 Unspecified intellectual disabilities: Secondary | ICD-10-CM | POA: Diagnosis not present

## 2017-03-04 DIAGNOSIS — R1010 Upper abdominal pain, unspecified: Secondary | ICD-10-CM | POA: Insufficient documentation

## 2017-03-04 DIAGNOSIS — F4325 Adjustment disorder with mixed disturbance of emotions and conduct: Secondary | ICD-10-CM | POA: Diagnosis not present

## 2017-03-04 DIAGNOSIS — Z79899 Other long term (current) drug therapy: Secondary | ICD-10-CM | POA: Diagnosis not present

## 2017-03-04 DIAGNOSIS — Z7984 Long term (current) use of oral hypoglycemic drugs: Secondary | ICD-10-CM | POA: Diagnosis not present

## 2017-03-04 DIAGNOSIS — J069 Acute upper respiratory infection, unspecified: Secondary | ICD-10-CM | POA: Insufficient documentation

## 2017-03-04 DIAGNOSIS — F4321 Adjustment disorder with depressed mood: Secondary | ICD-10-CM | POA: Diagnosis not present

## 2017-03-04 DIAGNOSIS — I10 Essential (primary) hypertension: Secondary | ICD-10-CM | POA: Insufficient documentation

## 2017-03-04 DIAGNOSIS — Z046 Encounter for general psychiatric examination, requested by authority: Secondary | ICD-10-CM | POA: Diagnosis present

## 2017-03-04 LAB — COMPREHENSIVE METABOLIC PANEL
ALBUMIN: 4.5 g/dL (ref 3.5–5.0)
ALK PHOS: 61 U/L (ref 38–126)
ALT: 17 U/L (ref 14–54)
AST: 21 U/L (ref 15–41)
Anion gap: 6 (ref 5–15)
BUN: 16 mg/dL (ref 6–20)
CALCIUM: 9.1 mg/dL (ref 8.9–10.3)
CO2: 23 mmol/L (ref 22–32)
CREATININE: 0.85 mg/dL (ref 0.44–1.00)
Chloride: 111 mmol/L (ref 101–111)
GFR calc Af Amer: >60 mL/min (ref >60)
GFR calc non Af Amer: >60 mL/min (ref >60)
GLUCOSE: 111 mg/dL — AB (ref 65–99)
Potassium: 4.2 mmol/L (ref 3.5–5.1)
Sodium: 140 mmol/L (ref 135–145)
Total Bilirubin: 0.4 mg/dL (ref 0.3–1.2)
Total Protein: 7.9 g/dL (ref 6.5–8.1)

## 2017-03-04 LAB — SALICYLATE LEVEL: Salicylate Lvl: <7 mg/dL (ref 2.8–30.0)

## 2017-03-04 LAB — URINE DRUG SCREEN, QUALITATIVE (ARMC ONLY)
Amphetamines, Ur Screen: NOT DETECTED
BARBITURATES, UR SCREEN: NOT DETECTED
Benzodiazepine, Ur Scrn: NOT DETECTED
COCAINE METABOLITE, UR ~~LOC~~: NOT DETECTED
Cannabinoid 50 Ng, Ur ~~LOC~~: NOT DETECTED
MDMA (Ecstasy)Ur Screen: NOT DETECTED
METHADONE SCREEN, URINE: NOT DETECTED
OPIATE, UR SCREEN: NOT DETECTED
Phencyclidine (PCP) Ur S: NOT DETECTED
Tricyclic, Ur Screen: NOT DETECTED

## 2017-03-04 LAB — URINALYSIS, COMPLETE (UACMP) WITH MICROSCOPIC
BACTERIA UA: NONE SEEN
BILIRUBIN URINE: NEGATIVE
Glucose, UA: NEGATIVE mg/dL
HGB URINE DIPSTICK: NEGATIVE
KETONES UR: NEGATIVE mg/dL
NITRITE: NEGATIVE
PROTEIN: 30 mg/dL — AB
Specific Gravity, Urine: 1.029 (ref 1.005–1.030)
pH: 6 (ref 5.0–8.0)

## 2017-03-04 LAB — CBC
HEMATOCRIT: 38.7 % (ref 35.0–47.0)
HEMOGLOBIN: 12.6 g/dL (ref 12.0–16.0)
MCH: 27.6 pg (ref 26.0–34.0)
MCHC: 32.5 g/dL (ref 32.0–36.0)
MCV: 84.7 fL (ref 80.0–100.0)
Platelets: 324 10*3/uL (ref 150–440)
RBC: 4.57 MIL/uL (ref 3.80–5.20)
RDW: 13.8 % (ref 11.5–14.5)
WBC: 5.3 10*3/uL (ref 3.6–11.0)

## 2017-03-04 LAB — ETHANOL: Alcohol, Ethyl (B): <5 mg/dL (ref <5)

## 2017-03-04 LAB — POCT PREGNANCY, URINE: PREG TEST UR: NEGATIVE

## 2017-03-04 LAB — ACETAMINOPHEN LEVEL: Acetaminophen (Tylenol), Serum: <10 ug/mL — ABNORMAL LOW (ref 10–30)

## 2017-03-04 NOTE — ED Notes (Signed)
poct pregnancy Negative 

## 2017-03-04 NOTE — ED Notes (Signed)
EDT, Megan Manning, in triage to change pt into behav scrubs and perform protocols; pt calm, cooperative, tearful at times

## 2017-03-04 NOTE — ED Notes (Signed)
Pt denies SI/HI at this time.  

## 2017-03-04 NOTE — ED Triage Notes (Signed)
Patient ambulatory to triage with steady gait, without difficulty or distress noted, [facility] [place] [facility] pt st has been off of her medications and "she got mad at her sister"; denies any SI or HI

## 2017-03-05 ENCOUNTER — Encounter: Payer: Self-pay | Admitting: Emergency Medicine

## 2017-03-05 ENCOUNTER — Emergency Department: Payer: No Typology Code available for payment source

## 2017-03-05 ENCOUNTER — Emergency Department (EMERGENCY_DEPARTMENT_HOSPITAL)
Admission: EM | Admit: 2017-03-05 | Discharge: 2017-03-16 | Disposition: A | Discharge status: Home / Self Care | Payer: No Typology Code available for payment source | Source: Home / Self Care | Attending: Emergency Medicine | Admitting: Emergency Medicine

## 2017-03-05 ENCOUNTER — Emergency Department
Admission: EM | Admit: 2017-03-05 | Discharge: 2017-03-05 | Disposition: A | Discharge status: Home / Self Care | Payer: No Typology Code available for payment source | Source: Home / Self Care | Attending: Emergency Medicine | Admitting: Emergency Medicine

## 2017-03-05 DIAGNOSIS — R4689 Other symptoms and signs involving appearance and behavior: Secondary | ICD-10-CM

## 2017-03-05 DIAGNOSIS — B349 Viral infection, unspecified: Secondary | ICD-10-CM

## 2017-03-05 DIAGNOSIS — R1010 Upper abdominal pain, unspecified: Secondary | ICD-10-CM

## 2017-03-05 DIAGNOSIS — J069 Acute upper respiratory infection, unspecified: Secondary | ICD-10-CM

## 2017-03-05 DIAGNOSIS — F329 Major depressive disorder, single episode, unspecified: Secondary | ICD-10-CM

## 2017-03-05 DIAGNOSIS — F4325 Adjustment disorder with mixed disturbance of emotions and conduct: Secondary | ICD-10-CM | POA: Diagnosis present

## 2017-03-05 DIAGNOSIS — F32A Depression, unspecified: Secondary | ICD-10-CM

## 2017-03-05 DIAGNOSIS — F79 Unspecified intellectual disabilities: Secondary | ICD-10-CM

## 2017-03-05 DIAGNOSIS — E119 Type 2 diabetes mellitus without complications: Secondary | ICD-10-CM

## 2017-03-05 LAB — HEPATIC FUNCTION PANEL
ALK PHOS: 58 U/L (ref 38–126)
ALT: 19 U/L (ref 14–54)
AST: 25 U/L (ref 15–41)
Albumin: 4.6 g/dL (ref 3.5–5.0)
Bilirubin, Direct: <0.1 mg/dL — ABNORMAL LOW (ref 0.1–0.5)
TOTAL PROTEIN: 8.2 g/dL — AB (ref 6.5–8.1)
Total Bilirubin: 0.6 mg/dL (ref 0.3–1.2)

## 2017-03-05 LAB — BASIC METABOLIC PANEL
Anion gap: 9 (ref 5–15)
BUN: 13 mg/dL (ref 6–20)
CHLORIDE: 109 mmol/L (ref 101–111)
CO2: 23 mmol/L (ref 22–32)
Calcium: 9.5 mg/dL (ref 8.9–10.3)
Creatinine, Ser: 0.79 mg/dL (ref 0.44–1.00)
GFR calc Af Amer: >60 mL/min (ref >60)
GFR calc non Af Amer: >60 mL/min (ref >60)
GLUCOSE: 105 mg/dL — AB (ref 65–99)
POTASSIUM: 4.1 mmol/L (ref 3.5–5.1)
Sodium: 141 mmol/L (ref 135–145)

## 2017-03-05 LAB — CBC
HEMATOCRIT: 40.1 % (ref 35.0–47.0)
Hemoglobin: 13.2 g/dL (ref 12.0–16.0)
MCH: 28.1 pg (ref 26.0–34.0)
MCHC: 33 g/dL (ref 32.0–36.0)
MCV: 85.1 fL (ref 80.0–100.0)
Platelets: 338 10*3/uL (ref 150–440)
RBC: 4.71 MIL/uL (ref 3.80–5.20)
RDW: 13.8 % (ref 11.5–14.5)
WBC: 5.2 10*3/uL (ref 3.6–11.0)

## 2017-03-05 LAB — TROPONIN I: Troponin I: <0.03 ng/mL (ref <0.03)

## 2017-03-05 LAB — GLUCOSE, CAPILLARY: Glucose-Capillary: 91 mg/dL (ref 65–99)

## 2017-03-05 LAB — LIPASE, BLOOD: Lipase: 36 U/L (ref 11–51)

## 2017-03-05 MED ORDER — FOSFOMYCIN TROMETHAMINE 3 G PO PACK
3.0000 g | PACK | Freq: Once | ORAL | Status: AC
  Administered 2017-03-05: 3 g via ORAL
  Filled 2017-03-05: qty 3

## 2017-03-05 MED ORDER — FAMOTIDINE 20 MG PO TABS
40.0000 mg | ORAL_TABLET | Freq: Once | ORAL | Status: AC
  Administered 2017-03-05: 40 mg via ORAL
  Filled 2017-03-05: qty 2

## 2017-03-05 MED ORDER — FAMOTIDINE 20 MG PO TABS: 20.0000 mg | ORAL_TABLET | Freq: Two times a day (BID) | ORAL | 0 refills | Status: DC

## 2017-03-05 MED ORDER — HYDROCOD POLST-CPM POLST ER 10-8 MG/5ML PO SUER
5.0000 mL | Freq: Once | ORAL | Status: AC
  Administered 2017-03-05: 5 mL via ORAL

## 2017-03-05 MED ORDER — HYDROCOD POLST-CPM POLST ER 10-8 MG/5ML PO SUER
ORAL | Status: AC
  Filled 2017-03-05: qty 5

## 2017-03-05 MED ORDER — ONDANSETRON 4 MG PO TBDP
8.0000 mg | ORAL_TABLET | Freq: Once | ORAL | Status: AC
  Administered 2017-03-05: 8 mg via ORAL
  Filled 2017-03-05: qty 2

## 2017-03-05 MED ORDER — GI COCKTAIL ~~LOC~~
30.0000 mL | ORAL | Status: AC
  Administered 2017-03-05: 30 mL via ORAL
  Filled 2017-03-05: qty 30

## 2017-03-05 MED ORDER — ONDANSETRON 4 MG PO TBDP: 4.0000 mg | ORAL_TABLET | Freq: Three times a day (TID) | ORAL | 0 refills | Status: DC | PRN

## 2017-03-05 MED ORDER — NAPROXEN 500 MG PO TABS: 500.0000 mg | ORAL_TABLET | Freq: Two times a day (BID) | ORAL | 0 refills | Status: DC

## 2017-03-05 NOTE — ED Notes (Signed)

## 2017-03-05 NOTE — ED Notes (Signed)

## 2017-03-05 NOTE — ED Notes (Signed)
Pt coughing, c/o chest pain associated as well as sore throat. MD consulted. See mar

## 2017-03-05 NOTE — ED Notes (Signed)
SOC MD requesting the nurse at bedside - I entered the room and the pt verbalizes to me that she is scared -  Tearful  - I stayed with her and encouraged her to speak with the doctor on the computer  She states  "I need my medicine - my sister has not been giving me my medicine."  SOC to complete consult in private  Pt verbalizes to me that she will talk to the doctor

## 2017-03-05 NOTE — BH Assessment (Signed)
Assessment Note  Megan Manning is an 36 y.o. female presenting to the ED via [employer].  Patient presents under involuntary commitment.  Patient states she has not taken her medication for a "couple of days" and says that she is "a little off".  She reports she got made at her sister and niece because she thought they were going through her phone.  She says she started yelling at them but denies being physically aggressive towards them.  Patient denies SI/HI and any auditory/visual hallucinations.  She denies drug/alcohol use.  Diagnosis: Intellectual disability, Borderline Personality Disorder  Past Medical History:  Past Medical History:  Diagnosis Date  . Diabetes mellitus without complication ([facility])   . Hypertension   . Hyperthyroidism   . Thyroid disease     Past Surgical History:  Procedure Laterality Date  . COLONOSCOPY WITH PROPOFOL N/A 12/22/2016   Procedure: COLONOSCOPY WITH PROPOFOL;  Surgeon: Wyline Mood, MD;  Location: [facility] ENDOSCOPY;  Service: Endoscopy;  Laterality: N/A;  . NO PAST SURGERIES      Family History:  Family History  Problem Relation Age of Onset  . CVA Neg Hx     Social History:  reports that she has never smoked. She has never used smokeless tobacco. She reports that she does not drink alcohol or use drugs.  Additional Social History:  Alcohol / Drug Use Pain Medications: See PTA Prescriptions: See PTA Over the Counter: See PTA History of alcohol / drug use?: No history of alcohol / drug abuse  CIWA: CIWA-Ar BP: (!) 148/94 Pulse Rate: 80 COWS:    Allergies:  Allergies  Allergen Reactions  . Chlorpromazine Other (See Comments)    Caused cornea problems    Home Medications:  (Not in a hospital admission)  OB/GYN Status:  No LMP recorded. Patient has had an injection.  General Assessment Data Location of Assessment: [facility] ED TTS Assessment: In system Is this a Tele or Face-to-Face Assessment?: Face-to-Face Is this an  Initial Assessment or a Re-assessment for this encounter?: Initial Assessment Marital status: Single Maiden name: n/a Is patient pregnant?: No Pregnancy Status: No Living Arrangements: Other relatives Can pt return to current living arrangement?: Yes Admission Status: Voluntary Is patient capable of signing voluntary admission?: No Referral Source: Self/Family/Friend Insurance type: Medicaid     Crisis Care Plan Living Arrangements: Other relatives Legal Guardian: Other relative Name of Psychiatrist: [facility] Name of Therapist: [facility]  Education Status Is patient currently in school?: No Current Grade: n/a Highest grade of school patient has completed: n/a Name of school: n/a Contact person: n/a  Risk to self with the past 6 months Suicidal Ideation: No Has patient been a risk to self within the past 6 months prior to admission? : No Suicidal Intent: No Has patient had any suicidal intent within the past 6 months prior to admission? : No Is patient at risk for suicide?: No Suicidal Plan?: No Has patient had any suicidal plan within the past 6 months prior to admission? : No Access to Means: No What has been your use of drugs/alcohol within the last 12 months?: Pt denies drug use Previous Attempts/Gestures: No Other Self Harm Risks: None identified Triggers for Past Attempts: None known Intentional Self Injurious Behavior: None Family Suicide History: No Recent stressful life event(s): Conflict (Comment) (conflict with sister) Persecutory voices/beliefs?: No Depression: No Substance abuse history and/or treatment for substance abuse?: No Suicide prevention information given to non-admitted patients: Not applicable  Risk to Others within the past  6 months Homicidal Ideation: No Does patient have any lifetime risk of violence toward others beyond the six months prior to admission? : No Thoughts of Harm to Others: No Current Homicidal Intent: No Current Homicidal Plan:  No Access to Homicidal Means: No Identified Victim: none identified History of harm to others?: No Assessment of Violence: None Noted Does patient have access to weapons?: No Criminal Charges Pending?: No Does patient have a court date: No Is patient on probation?: No  Psychosis Hallucinations: None noted Delusions: None noted  Mental Status Report Appearance/Hygiene: In scrubs Eye Contact: Good Motor Activity: Freedom of movement Speech: Soft Level of Consciousness: Alert Mood: Anxious Affect: Anxious Anxiety Level: Minimal Thought Processes: Relevant Judgement: Partial Orientation: Person, Place, Situation Obsessive Compulsive Thoughts/Behaviors: Moderate  Cognitive Functioning Concentration: Good Memory: Recent Intact, Remote Intact IQ: Average Insight: Fair Impulse Control: Fair Appetite: Good Weight Loss: 0 Weight Gain: 0 Sleep: No Change Vegetative Symptoms: None  ADLScreening [facility]) Patient's cognitive ability adequate to safely complete daily activities?: Yes Patient able to express need for assistance with ADLs?: Yes Independently performs ADLs?: Yes (appropriate for developmental age)  Prior Inpatient Therapy Prior Inpatient Therapy: No Prior Therapy Dates: n/a Prior Therapy Facilty/Provider(s): n/a Reason for Treatment: n/a  Prior Outpatient Therapy Prior Outpatient Therapy: Yes Prior Therapy Dates: current Prior Therapy Facilty/Provider(s): [facility] Reason for Treatment: schizophrenia Does patient have an ACCT team?: No Does patient have Intensive In-House Services?  : No Does patient have [facility] services? : No Does patient have P4CC services?: No  ADL Screening (condition at time of admission) Patient's cognitive ability adequate to safely complete daily activities?: Yes Patient able to express need for assistance with ADLs?: Yes Independently performs ADLs?: Yes (appropriate for developmental age)       Abuse/Neglect  Assessment (Assessment to be complete while patient is alone) Physical Abuse: Denies Verbal Abuse: Denies Sexual Abuse: Denies Exploitation of patient/patient's resources: Denies Self-Neglect: Denies Values / Beliefs Cultural Requests During Hospitalization: None Spiritual Requests During Hospitalization: None Consults Spiritual Care Consult Needed: No Social Work Consult Needed: No Merchant navy officerAdvance Directives (For Healthcare) Does Patient Have a Medical Advance Directive?: No Would patient like information on creating a medical advance directive?: No - Patient declined    Additional Information 1:1 In Past 12 Months?: No CIRT Risk: No Elopement Risk: No Does patient have medical clearance?: Yes     Disposition:  Disposition Initial Assessment Completed for this Encounter: Yes Disposition of Patient: Other dispositions Other disposition(s): Other (Comment) (Pending Psych MD consult)  On Site Evaluation by:   Reviewed with Physician:    Zadaya Cuadra C Gentle Hoge 03/05/2017 12:04 AM

## 2017-03-05 NOTE — ED Notes (Signed)
Patient in room talking with soc at this time

## 2017-03-05 NOTE — ED Notes (Signed)
Patient's guardian arrived and said via phone her husband would come in to sign patient out.

## 2017-03-05 NOTE — ED Triage Notes (Signed)
Pt is here with [place] [employer] with IVC papers. Pt states she does not want to go home with sister. Per IVC papers pt was threatening family. Pt denies she was threatening or harming others. Pt states she would like to be transferred to [facility].

## 2017-03-05 NOTE — ED Notes (Signed)
Patient called legal guardian to pick her up, I asked FN to call me when she has arrived to proceed with patient dc.

## 2017-03-05 NOTE — ED Triage Notes (Signed)
Pt to ED stating that "I need to sign myself back in" Pt states that she was released from our facility today. Pt reports having chest pain and abd pain, as well as swelling in her lower legs. Pt states that she has not ate in 2 days because of the pain in her abdomen. Pt is tearful in triage, denies SI/HI at this time.

## 2017-03-05 NOTE — ED Notes (Signed)
SOC MD on at this time with pt

## 2017-03-05 NOTE — Discharge Instructions (Signed)
Your symptoms today appear to be due to a viral illness. Your labs and ultrasound of the gallbladder are normal. Continue taking medications to control your symptoms and follow up with your doctor. Lab results are reported below:  Results for orders placed or performed during the hospital encounter of 03/05/17  Basic metabolic panel  Result Value Ref Range   Sodium 141 135 - 145 mmol/L   Potassium 4.1 3.5 - 5.1 mmol/L   Chloride 109 101 - 111 mmol/L   CO2 23 22 - 32 mmol/L   Glucose, Bld 105 (H) 65 - 99 mg/dL   BUN 13 6 - 20 mg/dL   Creatinine, Ser 4.54 0.44 - 1.00 mg/dL   Calcium 9.5 8.9 - 09.8 mg/dL   GFR calc non Af Amer >60 >60 mL/min   GFR calc Af Amer >60 >60 mL/min   Anion gap 9 5 - 15  CBC  Result Value Ref Range   WBC 5.2 3.6 - 11.0 K/uL   RBC 4.71 3.80 - 5.20 MIL/uL   Hemoglobin 13.2 12.0 - 16.0 g/dL   HCT 11.9 14.7 - 82.9 %   MCV 85.1 80.0 - 100.0 fL   MCH 28.1 26.0 - 34.0 pg   MCHC 33.0 32.0 - 36.0 g/dL   RDW 56.2 13.0 - 86.5 %   Platelets 338 150 - 440 K/uL  Troponin I  Result Value Ref Range   Troponin I <0.03 <0.03 ng/mL  Lipase, blood  Result Value Ref Range   Lipase 36 11 - 51 U/L  Hepatic function panel  Result Value Ref Range   Total Protein 8.2 (H) 6.5 - 8.1 g/dL   Albumin 4.6 3.5 - 5.0 g/dL   AST 25 15 - 41 U/L   ALT 19 14 - 54 U/L   Alkaline Phosphatase 58 38 - 126 U/L   Total Bilirubin 0.6 0.3 - 1.2 mg/dL   Bilirubin, Direct <7.8 (L) 0.1 - 0.5 mg/dL   Indirect Bilirubin NOT CALCULATED 0.3 - 0.9 mg/dL   Dg Chest 2 View  Result Date: 03/05/2017 CLINICAL DATA:  Chest pain. EXAM: CHEST  2 VIEW COMPARISON:  Chest x-ray from same day. FINDINGS: The cardiomediastinal silhouette is normal in size. Normal pulmonary vascularity. Persistent elevation of the right hemidiaphragm. No focal consolidation, pleural effusion, or pneumothorax. IMPRESSION: No active cardiopulmonary disease. Electronically Signed   By: Obie Dredge M.D.   On: 03/05/2017 17:12   Dg  Chest 2 View  Result Date: 03/05/2017 CLINICAL DATA:  Acute onset of cough.  Initial encounter. EXAM: CHEST  2 VIEW COMPARISON:  Chest radiograph performed 08/05/2009, and CTA of the chest performed 09/28/2016 FINDINGS: The lungs are well-aerated. There is mild elevation of the right hemidiaphragm. There is no evidence of focal opacification, pleural effusion or pneumothorax. The heart is normal in size; the mediastinal contour is within normal limits. No acute osseous abnormalities are seen. IMPRESSION: Mild elevation of the right hemidiaphragm. Lungs remain grossly clear. Electronically Signed   By: Roanna Raider M.D.   On: 03/05/2017 00:53   Ct Abdomen Pelvis W Contrast  Result Date: 02/22/2017 CLINICAL DATA:  Generalized abdominal pain for 1 day. Recent diagnosis of appendicitis treated medically. EXAM: CT ABDOMEN AND PELVIS WITH CONTRAST TECHNIQUE: Multidetector CT imaging of the abdomen and pelvis was performed using the standard protocol following bolus administration of intravenous contrast. CONTRAST:  ISOVUE-300 IOPAMIDOL (ISOVUE-300) INJECTION 61% COMPARISON:  02/14/2017 FINDINGS: Lower chest: Infiltration or atelectasis in the right lung base similar to  prior study. Can't exclude pneumonia. Small esophageal hiatal hernia. Hepatobiliary: No focal liver abnormality is seen. No gallstones, gallbladder wall thickening, or biliary dilatation. Pancreas: Unremarkable. No pancreatic ductal dilatation or surrounding inflammatory changes. Spleen: Normal in size without focal abnormality. Adrenals/Urinary Tract: Adrenal glands are unremarkable. Kidneys are normal, without renal calculi, focal lesion, or hydronephrosis. Bladder is unremarkable. Stomach/Bowel: Stomach, small bowel, and colon are not abnormally distended. Scattered stool throughout the colon. Appendix is normal. No inflammatory stranding is demonstrated. No periappendiceal fluid collections. Vascular/Lymphatic: No significant vascular  findings are present. No enlarged abdominal or pelvic lymph nodes. Reproductive: Uterus and bilateral adnexa are unremarkable. Other: No abdominal wall hernia or abnormality. No abdominopelvic ascites. Musculoskeletal: No acute or significant osseous findings. Mild degenerative changes in the spine. IMPRESSION: Infiltration or atelectasis in the right lung base. Can't exclude pneumonia in the appropriate clinical setting. Normal appearance of the appendix. No inflammatory changes identified. No evidence of bowel obstruction. Electronically Signed   By: Burman Nieves M.D.   On: 02/22/2017 06:33   Ct Abdomen Pelvis W Contrast  Result Date: 02/14/2017 CLINICAL DATA:  Generalized abdominal pain, burning pain, hypertension, diabetes mellitus EXAM: CT ABDOMEN AND PELVIS WITH CONTRAST TECHNIQUE: Multidetector CT imaging of the abdomen and pelvis was performed using the standard protocol following bolus administration of intravenous contrast. Sagittal and coronal MPR images reconstructed from axial data set. CONTRAST:  100 cc Isovue 370 IV.  No oral contrast administered. COMPARISON:  None FINDINGS: Lower chest: Subsegmental atelectasis RIGHT lower lobe Hepatobiliary: Gallbladder and liver unremarkable Pancreas: Normal appearance Spleen: Normal appearance. Small splenule inferior to splenic hilum. Adrenals/Urinary Tract: Adrenal glands normal appearance Stomach/Bowel: Unremarkable LEFT kidney without mass, calcification or hydronephrosis. Minimally inhomogeneous RIGHT nephrogram versus LEFT, cannot exclude fell pyelonephritis changes. No LEFT hydronephrosis or hydroureter. Bladder unremarkable. Vascular/Lymphatic: Distal appendix appears minimally enlarged and ill defined, cannot exclude distal appendicitis. Colon interposition between liver and diaphragm. Minimally prominent stool in rectum. Stomach and bowel loops otherwise normal appearance for technique. Reproductive: Unremarkable Other: No free air or free  fluid.  No hernia. Musculoskeletal: No acute osseous findings. IMPRESSION: Subtle inhomogeneity of the RIGHT nephrogram versus LEFT, cannot exclude pyelonephritis ; correlation with urinalysis recommended. Distal appendix appears enlarged and ill-defined with questionable periappendiceal infiltrative changes, cannot exclude distal appendicitis. Electronically Signed   By: Ulyses Southward M.D.   On: 02/14/2017 15:08   Dg Knee Complete 4 Views Right  Result Date: 03/05/2017 CLINICAL DATA:  Acute onset of right knee pain and swelling. Initial encounter. EXAM: RIGHT KNEE - COMPLETE 4+ VIEW COMPARISON:  None. FINDINGS: There is no evidence of fracture or dislocation. The joint spaces are preserved. No significant degenerative change is seen; the patellofemoral joint is grossly unremarkable in appearance. A fabella is noted. No significant joint effusion is seen. The visualized soft tissues are normal in appearance. IMPRESSION: No evidence of fracture or dislocation. Electronically Signed   By: Roanna Raider M.D.   On: 03/05/2017 00:52   US Abdomen Limited Ruq  Result Date: 03/05/2017 CLINICAL DATA:  Upper abdominal pain and right upper quadrant tenderness. EXAM: ULTRASOUND ABDOMEN LIMITED RIGHT UPPER QUADRANT COMPARISON:  CT of the abdomen on 02/22/2017 FINDINGS: Gallbladder: No gallstones or wall thickening visualized. No sonographic Murphy sign noted by sonographer. Common bile duct: Diameter: Normal caliber of 3 mm. Liver: The liver demonstrates coarse echotexture and mildly increased echogenicity, likely reflecting diffuse steatosis. No overt cirrhotic contour abnormalities or focal lesions are identified. There is no evidence of intrahepatic biliary  ductal dilatation. The portal vein is open. IMPRESSION: Normal appearance of gallbladder and bile ducts. The liver demonstrates evidence of probable mild steatosis. Electronically Signed   By: Irish LackGlenn  Yamagata M.D.   On: 03/05/2017 18:54
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
## 2017-03-05 NOTE — ED Triage Notes (Signed)
First Nurse Note:  Arrives with [employer] under IVC paperwork.  Patient was reportedly active aggressively.  Patient AAOx3.  Calm and cooperative at this time.

## 2017-03-05 NOTE — Discharge Instructions (Signed)
You have been seen in the Emergency Department (ED)  today for psychiatric issues.  You have been evaluated by the behavioral medicine specialists and are being referred to: ° °[facility] °[address] °[place], [place] [address] °Phone:  [phone] or [phone] ° °Open Access:   °Walk-in ASSESSMENT hours, M-W-F, 8:00am - 3:00pm °Advanced Acess CRISIS:  M-F, 8:00am - 8:00pm °Outpatient Services Office Hours:  M-F, 8:00am - 5:00pm ° °Please return to the Emergency Department (ED)  immediately if you have ANY thoughts of hurting yourself or anyone else, so that we may help you. ° °Please avoid alcohol and drug use. ° °Follow up with your doctor and/or therapist as soon as possible regarding today's ED  visit.   Please follow up any other recommendations and clinic appointments provided by the psychiatry team that saw you in the Emergency Department. ° °
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
## 2017-03-05 NOTE — ED Notes (Signed)
ED  Is the patient under IVC or is there intent for IVC:  no Is the patient medically cleared: Yes.   Is there vacancy in the ED BHU: Yes.   Is the population mix appropriate for patient: Yes.   Is the patient awaiting placement in inpatient or outpatient setting:  Has the patient had a psychiatric consult: consult complete  - pt to discharge home  Survey of unit performed for contraband, proper placement and condition of furniture, tampering with fixtures in bathroom, shower, and each patient room: Yes.  ; Findings:  APPEARANCE/BEHAVIOR Calm and cooperative NEURO ASSESSMENT Orientation: oriented x3  Denies pain Hallucinations: No denies .None noted (Hallucinations) Speech: Normal Gait: normal RESPIRATORY ASSESSMENT Even  Unlabored respirations  CARDIOVASCULAR ASSESSMENT Pulses equal   regular rate  Skin warm and dry   GASTROINTESTINAL ASSESSMENT no GI complaint EXTREMITIES Full ROM  PLAN OF CARE Provide calm/safe environment. Vital signs assessed twice daily. ED BHU Assessment once each 12-hour shift. Collaborate with TTS when available or as condition indicates. Assure the ED provider has rounded once each shift. Provide and encourage hygiene. Provide redirection as needed. Assess for escalating behavior; address immediately and inform ED provider.  Assess family dynamic and appropriateness for visitation as needed: Yes.  ; If necessary, describe findings:  Educate the patient/family about BHU procedures/visitation: Yes.  ; If necessary, describe findings:

## 2017-03-05 NOTE — ED Notes (Signed)
Pt discharge to her caregiver - her sister at this time  Pt [facility]  Sister reports that she has all of her medicine at home she just does not take it like she is supposed to

## 2017-03-05 NOTE — ED Notes (Signed)
Assessment completed  - she is to discharge to home  - I provided her with a phone and she called her sister to come and pick her up  She remains tearful during assessment

## 2017-03-05 NOTE — ED Provider Notes (Signed)
Spoke with tele psychiatrist, he advises the patient is calm now and may be discharged. No findings support need for hospitalization. He recommends discharge, patient's sister is comfortable picking her up. Reports that she will be following up with [facility].     Sharyn CreamerQuale, Mark, MD 03/05/17 [phone]

## 2017-03-05 NOTE — BH Assessment (Signed)
Attempted to contact the patient's guardian for collateral information.  The guardian did not answer and the voicemail was not set up to leave a message.

## 2017-03-06 LAB — GLUCOSE, CAPILLARY
GLUCOSE-CAPILLARY: 75 mg/dL (ref 65–99)
Glucose-Capillary: 75 mg/dL (ref 65–99)
Glucose-Capillary: 75 mg/dL (ref 65–99)
Glucose-Capillary: 72 mg/dL (ref 65–99)

## 2017-03-06 MED ORDER — HALOPERIDOL 5 MG PO TABS
5.0000 mg | ORAL_TABLET | Freq: Two times a day (BID) | ORAL | Status: DC
  Administered 2017-03-06 – 2017-03-16 (×19): 5 mg via ORAL
  Filled 2017-03-06 (×21): qty 1

## 2017-03-06 MED ORDER — GI COCKTAIL ~~LOC~~
ORAL | Status: AC
  Administered 2017-03-06: 30 mL via ORAL
  Filled 2017-03-06: qty 30

## 2017-03-06 MED ORDER — GI COCKTAIL ~~LOC~~
30.0000 mL | Freq: Once | ORAL | Status: AC
  Administered 2017-03-06: 30 mL via ORAL

## 2017-03-06 MED ORDER — METFORMIN HCL 500 MG PO TABS: 500.0000 mg | ORAL_TABLET | Freq: Every day | ORAL | Status: DC

## 2017-03-06 MED ORDER — DIVALPROEX SODIUM 125 MG PO CSDR
250.0000 mg | DELAYED_RELEASE_CAPSULE | Freq: Two times a day (BID) | ORAL | Status: DC
  Administered 2017-03-06 – 2017-03-16 (×19): 250 mg via ORAL
  Filled 2017-03-06 (×28): qty 2

## 2017-03-06 MED ORDER — BENZTROPINE MESYLATE 1 MG PO TABS
1.0000 mg | ORAL_TABLET | Freq: Two times a day (BID) | ORAL | Status: DC
  Administered 2017-03-06 – 2017-03-16 (×19): 1 mg via ORAL
  Filled 2017-03-06 (×21): qty 1

## 2017-03-06 NOTE — ED Notes (Signed)
PT IVC/ PENDING PLACEMENT  

## 2017-03-06 NOTE — ED Notes (Signed)
Pt reports she feels like her blood sugar is dropping. Explained to pt that she would need to eat if it was and pt still refusing to eat.

## 2017-03-06 NOTE — ED Notes (Signed)
Patient refusing her dinner meal tray and refusing anything to drink.  Charge RN notified and Dr. Cyril LoosenKinner notified.  Patient reports having abdominal pain discomfort.  Orders received from Dr. Cyril LoosenKinner.  Will continue to monitor.

## 2017-03-06 NOTE — ED Notes (Signed)
Pt took 3 sips of water with her medications. Encouraged pt to drink more put pt would not. Water left at bedside and pt encouraged to ask if she decided she would like something to eat or drink.

## 2017-03-06 NOTE — ED Notes (Signed)
BEHAVIORAL HEALTH ROUNDING Patient sleeping: Yes.   Patient alert and oriented: not applicable SLEEPING Behavior appropriate: Yes.  ; If no, describe: SLEEPING Nutrition and fluids offered: No SLEEPING Toileting and hygiene offered: NoSLEEPING Sitter present: not applicable, Q 15 min safety rounds and observation. Law enforcement present: Yes ODS 
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
## 2017-03-06 NOTE — ED Notes (Signed)
Patient continuing to refuse food or drink.  Patient states her stomach does not feel better after GI cocktail.  MD notified.  Will continue to monitor.

## 2017-03-06 NOTE — ED Notes (Signed)
BEHAVIORAL HEALTH ROUNDING  Patient sleeping: No.  Patient alert and oriented: yes  Behavior appropriate: Yes. ; If no, describe:  Nutrition and fluids offered: Yes  Toileting and hygiene offered: Yes  Sitter present: not applicable, Q 15 min safety rounds and observation.  Law enforcement present: Yes ODS  

## 2017-03-06 NOTE — ED Notes (Signed)
Pt continues to not want to eat. Dr Cyril LoosenKinner informed and orders for routine finger sticks requested as pt takes metformin normally.

## 2017-03-06 NOTE — ED Notes (Signed)
Patient given lunch tray.  Patient states her stomach hurts and she does not want to eat lunch.  MD notified.

## 2017-03-06 NOTE — ED Notes (Addendum)
ENVIRONMENTAL ASSESSMENT  Potentially harmful objects out of patient reach: Yes.  Personal belongings secured: Yes.  Patient dressed in hospital provided attire only: Yes.  Plastic bags out of patient reach: Yes.  Patient care equipment (cords, cables, call bells, lines, and drains) shortened, removed, or accounted for: Yes.  Equipment and supplies removed from bottom of stretcher: Yes.  Potentially toxic materials out of patient reach: Yes.  Sharps container removed or out of patient reach: Yes.   BEHAVIORAL HEALTH ROUNDING  Patient sleeping: No.  Patient alert and oriented: yes  Behavior appropriate: Yes. ; If no, describe:  Nutrition and fluids offered: Yes  Toileting and hygiene offered: Yes  Sitter present: not applicable, Q 15 min safety rounds and observation.  Law enforcement present: Yes ODS  ED BHU PLACEMENT JUSTIFICATION  Is the patient under IVC or is there intent for IVC: Yes.  Is the patient medically cleared: Yes.  Is there vacancy in the ED BHU: Yes.  Is the population mix appropriate for patient: no.  Is the patient awaiting placement in inpatient or outpatient setting: Yes.  Has the patient had a psychiatric consult: Yes.  Survey of unit performed for contraband, proper placement and condition of furniture, tampering with fixtures in bathroom, shower, and each patient room: Yes. ; Findings: All clear  APPEARANCE/BEHAVIOR  calm, cooperative and adequate rapport can be established  NEURO ASSESSMENT  Orientation: time, place and person  Hallucinations: No.None noted (Hallucinations)  Speech: Normal  Gait: normal  RESPIRATORY ASSESSMENT  WNL  CARDIOVASCULAR ASSESSMENT  WNL  GASTROINTESTINAL ASSESSMENT  WNL  EXTREMITIES  WNL  PLAN OF CARE  Provide calm/safe environment. Vital signs assessed TID. ED BHU Assessment once each 12-hour shift. Collaborate with TTS daily or as condition indicates. Assure the ED provider has rounded once each shift. Provide and  encourage hygiene. Provide redirection as needed. Assess for escalating behavior; address immediately and inform ED provider.  Assess family dynamic and appropriateness for visitation as needed: Yes. ; If necessary, describe findings:  Educate the patient/family about BHU procedures/visitation: Yes. ; If necessary, describe findings: Pt is calm and cooperative at this time. Pt understanding and accepting of unit procedures/rules. Will continue to monitor with Q 15 min safety rounds and observation.     

## 2017-03-06 NOTE — ED Notes (Signed)
BEHAVIORAL HEALTH ROUNDING Patient sleeping: No. Patient alert and oriented: yes Behavior appropriate: Yes.  ; If no, describe:  Nutrition and fluids offered: Yes  Toileting and hygiene offered: Yes  Sitter present: q 15 min checks Law enforcement present: Yes  

## 2017-03-06 NOTE — ED Notes (Signed)
Pt refused snack tray and drink. Complains of stomach ache.AS

## 2017-03-06 NOTE — ED Notes (Signed)
BEHAVIORAL HEALTH ROUNDING Patient sleeping: Yes.   Patient alert and oriented: sleeping Behavior appropriate: Yes.  ; If no, describe:  Nutrition and fluids offered: Yes  Toileting and hygiene offered: Yes  Sitter present: q 15 min checks Law enforcement present: Yes  

## 2017-03-06 NOTE — ED Notes (Signed)
BEHAVIORAL HEALTH ROUNDING Patient sleeping: Yes.   Patient alert and oriented: sleeping Behavior appropriate: Yes.  ; If no, describe:  Nutrition and fluids offered: Yes  Toileting and hygiene offered: Yes  Sitter present: q 15 min checks Law enforcement present: yes
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
## 2017-03-06 NOTE — ED Notes (Signed)
Again encouraged pt to eat but she refused.

## 2017-03-06 NOTE — ED Provider Notes (Signed)
Patient is refusing to eat. We will hold diabetic medications for now.    Jene EveryKinner, Deiona Hooper, MD 03/06/17 [phone]

## 2017-03-06 NOTE — Progress Notes (Signed)
Received called from Tobi BastosAnna, RN after speaking with Dr. Roxan Hockeyobinson. Via our conversation, explain to Dr. Roxan Hockeyobinson that  Due to patient Intellectual disability patient is not appropriate for milieu environment and does not meet criteria for this unit. Dr. Roxan Hockeyobinson was alright with the decision. Will look at other patients that meets criteria admission.

## 2017-03-06 NOTE — ED Notes (Signed)
BEHAVIORAL HEALTH ROUNDING  Patient sleeping: No.  Patient alert and oriented: yes  Behavior appropriate: Yes. ; If no, describe:  Nutrition and fluids offered: Yes  Toileting and hygiene offered: Yes  Sitter present: not applicable, Q 15 min safety rounds and observation.  Law enforcement present: Yes ODS  Explained to pt that she would be getting her medications soon and that she should eat something to prevent them upsetting her stomach. Pt refused to take a snack.

## 2017-03-06 NOTE — ED Notes (Signed)
[facility]. Camera placed in room and pt understanding about process.

## 2017-03-07 LAB — GLUCOSE, CAPILLARY
GLUCOSE-CAPILLARY: 66 mg/dL (ref 65–99)
GLUCOSE-CAPILLARY: 51 mg/dL — AB (ref 65–99)
Glucose-Capillary: 56 mg/dL — ABNORMAL LOW (ref 65–99)
Glucose-Capillary: 55 mg/dL — ABNORMAL LOW (ref 65–99)
Glucose-Capillary: 57 mg/dL — ABNORMAL LOW (ref 65–99)
Glucose-Capillary: 50 mg/dL — ABNORMAL LOW (ref 65–99)

## 2017-03-07 NOTE — ED Notes (Signed)
BEHAVIORAL HEALTH ROUNDING Patient sleeping: Yes.   Patient alert and oriented: not applicable SLEEPING Behavior appropriate: Yes.  ; If no, describe: SLEEPING Nutrition and fluids offered: No SLEEPING Toileting and hygiene offered: NoSLEEPING Sitter present: not applicable, Q 15 min safety rounds and observation. Law enforcement present: Yes ODS 

## 2017-03-07 NOTE — ED Notes (Signed)
Pt refusing drink or food. EDP notified.

## 2017-03-07 NOTE — ED Notes (Addendum)
Pt states that she will drink water. Given 12 oz water.   Pt drank minimal water 1750

## 2017-03-07 NOTE — ED Notes (Signed)
Pt refusing to eat and drink. EDP informed and of patient morning blood sugar. No further orders at this time.

## 2017-03-07 NOTE — ED Notes (Signed)
Pt in shower. Pt linens changed, provided new clothing.

## 2017-03-07 NOTE — ED Notes (Signed)
BEHAVIORAL HEALTH ROUNDING  Patient sleeping: No.  Patient alert and oriented: yes  Behavior appropriate: Yes. ; If no, describe:  Nutrition and fluids offered: Yes  Toileting and hygiene offered: Yes  Sitter present: not applicable, Q 15 min safety rounds and observation.  Law enforcement present: Yes ODS  

## 2017-03-07 NOTE — ED Provider Notes (Signed)
-----------------------------------------   7:20 AM on 03/07/2017 -----------------------------------------   Blood pressure 131/77, pulse 88, temperature 98.5 F (36.9 C), temperature source Oral, resp. rate 18, height 5\' 3"  (1.6 m), weight 127 kg (280 lb), SpO2 97 %.  The patient had no acute events since last update.  Calm and cooperative at this time.  Disposition is pending Psychiatry/Behavioral Medicine team recommendations.     Irean HongSung, Jade J, MD 03/07/17 [phone]

## 2017-03-07 NOTE — ED Notes (Signed)
Pt lying in bed with eyes open.  

## 2017-03-07 NOTE — ED Notes (Signed)
Pt given warm blanket.

## 2017-03-07 NOTE — ED Notes (Signed)
Pt drank approx 2 oz of apple juice with medication administration, encouraged to drink more, refuses.

## 2017-03-07 NOTE — ED Notes (Signed)
EDP informed of pt CBG. Verbal order to repeat in 1 hour. Pt refusing to eat or drink anything offered by this RN and EDT despite being informed of it's importance to blood sugar.

## 2017-03-07 NOTE — ED Notes (Signed)
Pt given supper tray. Pt refused tray stating she was not hungry. This tech ask the pt why she wouldn't eat and pt stated she just wasn't hungry because her stomach feels funny. Pt was told that not eating could make her very sick and pt stated she did not want to be sick, but she just couldn't eat and was not hungry.

## 2017-03-07 NOTE — BH Assessment (Signed)
Referral information for Psychiatric Hospitalization faxed to;    [facility] [phone])   [facility] [phone]),    [facility] [phone])

## 2017-03-07 NOTE — ED Notes (Signed)
ENVIRONMENTAL ASSESSMENT  Potentially harmful objects out of patient reach: Yes.  Personal belongings secured: Yes.  Patient dressed in hospital provided attire only: Yes.  Plastic bags out of patient reach: Yes.  Patient care equipment (cords, cables, call bells, lines, and drains) shortened, removed, or accounted for: Yes.  Equipment and supplies removed from bottom of stretcher: Yes.  Potentially toxic materials out of patient reach: Yes.  Sharps container removed or out of patient reach: Yes.   BEHAVIORAL HEALTH ROUNDING  Patient sleeping: No.  Patient alert and oriented: yes  Behavior appropriate: Yes. ; If no, describe:  Nutrition and fluids offered: Yes  Toileting and hygiene offered: Yes  Sitter present: not applicable, Q 15 min safety rounds and observation.  Law enforcement present: Yes ODS  ED BHU PLACEMENT JUSTIFICATION  Is the patient under IVC or is there intent for IVC: Yes.  Is the patient medically cleared: Yes.  Is there vacancy in the ED BHU: Yes.  Is the population mix appropriate for patient: Yno.  Is the patient awaiting placement in inpatient or outpatient setting: Yes.  Has the patient had a psychiatric consult: Yes.  Survey of unit performed for contraband, proper placement and condition of furniture, tampering with fixtures in bathroom, shower, and each patient room: Yes. ; Findings: All clear  APPEARANCE/BEHAVIOR  calm, cooperative and adequate rapport can be established but pt continues to refuse to eat NEURO ASSESSMENT  Orientation: time, place and person  Hallucinations: No.None noted (Hallucinations)  Speech: Normal  Gait: normal  RESPIRATORY ASSESSMENT  WNL  CARDIOVASCULAR ASSESSMENT  WNL  GASTROINTESTINAL ASSESSMENT  WNL  EXTREMITIES  WNL  PLAN OF CARE  Provide calm/safe environment. Vital signs assessed TID. ED BHU Assessment once each 12-hour shift. Collaborate with TTS daily or as condition indicates. Assure the ED provider has rounded  once each shift. Provide and encourage hygiene. Provide redirection as needed. Assess for escalating behavior; address immediately and inform ED provider.  Assess family dynamic and appropriateness for visitation as needed: Yes. ; If necessary, describe findings:  Educate the patient/family about BHU procedures/visitation: Yes. ; If necessary, describe findings: Pt is calm and cooperative at this time. Pt understanding and accepting of unit procedures/rules. Will continue to monitor with Q 15 min safety rounds and observation.

## 2017-03-08 ENCOUNTER — Emergency Department: Payer: No Typology Code available for payment source

## 2017-03-08 ENCOUNTER — Encounter: Payer: Self-pay | Admitting: Radiology

## 2017-03-08 LAB — COMPREHENSIVE METABOLIC PANEL
ALBUMIN: 4 g/dL (ref 3.5–5.0)
ALK PHOS: 53 U/L (ref 38–126)
ALT: 18 U/L (ref 14–54)
ANION GAP: 11 (ref 5–15)
AST: 20 U/L (ref 15–41)
BILIRUBIN TOTAL: 1 mg/dL (ref 0.3–1.2)
BUN: 19 mg/dL (ref 6–20)
CALCIUM: 9.2 mg/dL (ref 8.9–10.3)
CO2: 19 mmol/L — AB (ref 22–32)
CREATININE: 0.62 mg/dL (ref 0.44–1.00)
Chloride: 106 mmol/L (ref 101–111)
GFR calc Af Amer: >60 mL/min (ref >60)
GFR calc non Af Amer: >60 mL/min (ref >60)
GLUCOSE: 58 mg/dL — AB (ref 65–99)
Potassium: 4.1 mmol/L (ref 3.5–5.1)
Sodium: 136 mmol/L (ref 135–145)
TOTAL PROTEIN: 7.6 g/dL (ref 6.5–8.1)

## 2017-03-08 LAB — URINALYSIS, COMPLETE (UACMP) WITH MICROSCOPIC
BACTERIA UA: NONE SEEN
Bilirubin Urine: NEGATIVE
Glucose, UA: NEGATIVE mg/dL
Ketones, ur: 20 mg/dL — AB
NITRITE: NEGATIVE
PROTEIN: 30 mg/dL — AB
SPECIFIC GRAVITY, URINE: 1.027 (ref 1.005–1.030)
pH: 5 (ref 5.0–8.0)

## 2017-03-08 LAB — CBC WITH DIFFERENTIAL/PLATELET
BASOS PCT: 1 %
Basophils Absolute: 0 10*3/uL (ref 0–0.1)
Eosinophils Absolute: 0 10*3/uL (ref 0–0.7)
Eosinophils Relative: 1 %
HEMATOCRIT: 36.9 % (ref 35.0–47.0)
HEMOGLOBIN: 12.6 g/dL (ref 12.0–16.0)
LYMPHS ABS: 2 10*3/uL (ref 1.0–3.6)
Lymphocytes Relative: 42 %
MCH: 28.1 pg (ref 26.0–34.0)
MCHC: 34 g/dL (ref 32.0–36.0)
MCV: 82.8 fL (ref 80.0–100.0)
MONOS PCT: 8 %
Monocytes Absolute: 0.4 10*3/uL (ref 0.2–0.9)
NEUTROS ABS: 2.3 10*3/uL (ref 1.4–6.5)
NEUTROS PCT: 48 %
Platelets: 286 10*3/uL (ref 150–440)
RBC: 4.46 MIL/uL (ref 3.80–5.20)
RDW: 13.3 % (ref 11.5–14.5)
WBC: 4.8 10*3/uL (ref 3.6–11.0)

## 2017-03-08 LAB — GLUCOSE, CAPILLARY
GLUCOSE-CAPILLARY: 71 mg/dL (ref 65–99)
GLUCOSE-CAPILLARY: 58 mg/dL — AB (ref 65–99)
Glucose-Capillary: 63 mg/dL — ABNORMAL LOW (ref 65–99)
Glucose-Capillary: 63 mg/dL — ABNORMAL LOW (ref 65–99)
Glucose-Capillary: 63 mg/dL — ABNORMAL LOW (ref 65–99)

## 2017-03-08 LAB — LIPASE, BLOOD: Lipase: 26 U/L (ref 11–51)

## 2017-03-08 MED ORDER — DEXTROSE-NACL 5-0.45 % IV SOLN
INTRAVENOUS | Status: DC
  Administered 2017-03-08: 02:00:00 via INTRAVENOUS

## 2017-03-08 MED ORDER — LACTULOSE 10 GM/15ML PO SOLN
30.0000 g | Freq: Once | ORAL | Status: AC
  Administered 2017-03-08: 30 g via ORAL
  Filled 2017-03-08: qty 60

## 2017-03-08 MED ORDER — DEXTROSE 5 % IV BOLUS
1000.0000 mL | Freq: Once | INTRAVENOUS | Status: AC
  Administered 2017-03-08: 1000 mL via INTRAVENOUS
  Filled 2017-03-08: qty 1000

## 2017-03-08 MED ORDER — FOSFOMYCIN TROMETHAMINE 3 G PO PACK
3.0000 g | PACK | Freq: Once | ORAL | Status: AC
  Administered 2017-03-08: 3 g via ORAL
  Filled 2017-03-08: qty 3

## 2017-03-08 MED ORDER — IOPAMIDOL (ISOVUE-300) INJECTION 61%
100.0000 mL | Freq: Once | INTRAVENOUS | Status: AC | PRN
  Administered 2017-03-08: 100 mL via INTRAVENOUS

## 2017-03-08 MED ORDER — IOPAMIDOL (ISOVUE-300) INJECTION 61%: 30.0000 mL | Freq: Once | INTRAVENOUS | Status: DC | PRN

## 2017-03-08 NOTE — ED Notes (Signed)

## 2017-03-08 NOTE — ED Notes (Signed)
BEHAVIORAL HEALTH ROUNDING Patient sleeping: No. Patient alert and oriented: yes Behavior appropriate: Yes.  ; If no, describe:  Nutrition and fluids offered: yes Toileting and hygiene offered: Yes  Sitter present: q15 minute observations and security  monitoring Law enforcement present: Yes  ODS  

## 2017-03-08 NOTE — ED Provider Notes (Signed)
-----------------------------------------   12:27 AM on 03/08/2017 -----------------------------------------  Patient has not eaten food and almost 50 hours. States her "stomach hurts". She has been able to tolerate sips of water with her medications without emesis. We have offered her anything she wants to eat but she refuses. Chart review demonstrates patient was treated with IV antibiotics for appendicitis on her last visit several weeks ago. She had her repeat scan on July 30 which demonstrated resolution of appendicitis. Given her abdominal pain and anorexia, will repeat lab work and CT scan.  ----------------------------------------- 6:42 AM on 03/08/2017 -----------------------------------------  Loney LaurenceLabwork was unremarkable and CT abdomen pelvis interpreted per Dr. Phill MyronMcClintock demonstrated:  1. Elevation the right hemidiaphragm with associated right basilar  atelectasis. Superimposed infection would be difficult to exclude,  and could be considered in the correct clinical setting.  2. Large volume stool within the distal colon/rectal vault,  suggesting constipation.  3. No other acute intra-abdominal or pelvic process identified.   No further events. Patient tells me will she eat this morning. Will order Lactulose to use as needed. Urinalysis noted. Will order single-dose fosfomycin.    Irean HongSung, Jade J, MD 03/08/17 [phone]

## 2017-03-08 NOTE — ED Notes (Signed)
BEHAVIORAL HEALTH ROUNDING  Patient sleeping: No.  Patient alert and oriented: yes  Behavior appropriate: Yes. ; If no, describe:  Nutrition and fluids offered: Yes  Toileting and hygiene offered: Yes  Sitter present: not applicable, Q 15 min safety rounds and observation.  Law enforcement present: Yes ODS  

## 2017-03-08 NOTE — ED Notes (Signed)
Dr Dolores FrameSung in to speak with pt about her not eating. Pt still refusing to eat.

## 2017-03-08 NOTE — ED Notes (Signed)
Attempt x 2 stick for IV by this RN unsuccessful.

## 2017-03-08 NOTE — ED Notes (Signed)
Water with sugar added provided - I offered crackers but she refused

## 2017-03-08 NOTE — BH Assessment (Signed)
Writer spoke with patient to complete updated assessment. Patient states she's no longer having SI. She also denies AV/HH and HI. She was oriented to person, place and situation. Patient is asking when she's going home. Writer told her, he did not know.

## 2017-03-08 NOTE — ED Notes (Signed)
BEHAVIORAL HEALTH ROUNDING Patient sleeping: Yes.   Patient alert and oriented: not applicable SLEEPING Behavior appropriate: Yes.  ; If no, describe: SLEEPING Nutrition and fluids offered: No SLEEPING Toileting and hygiene offered: NoSLEEPING Sitter present: not applicable, Q 15 min safety rounds and observation. Law enforcement present: Yes ODS 

## 2017-03-08 NOTE — ED Notes (Signed)
Pt observed lying in bed  - she has a sad look on her face   Pt visualized with NAD  No verbalized needs or concerns at this time  Continue to monitor

## 2017-03-08 NOTE — ED Notes (Signed)
Patient given water with sugar since patient has not eaten all day and refusing to drink juice.

## 2017-03-08 NOTE — ED Notes (Signed)
ED Is the patient under IVC or is there intent for IVC: Yes.   Is the patient medically cleared: Yes.   Is there vacancy in the ED BHU: Yes.   Is the population mix appropriate for patient: Yes.   Is the patient awaiting placement in inpatient or outpatient setting: Yes.  inpt admission pending Has the patient had a psychiatric consult: Yes.   Survey of unit performed for contraband, proper placement and condition of furniture, tampering with fixtures in bathroom, shower, and each patient room: Yes.  ; Findings:  APPEARANCE/BEHAVIOR Calm and cooperative NEURO ASSESSMENT Orientation: oriented x3  Denies pain Hallucinations: No.None noted (Hallucinations) Speech: Normal Gait: normal RESPIRATORY ASSESSMENT Even  Unlabored respirations  CARDIOVASCULAR ASSESSMENT Pulses equal   regular rate  Skin warm and dry   GASTROINTESTINAL ASSESSMENT no GI complaint EXTREMITIES Full ROM  PLAN OF CARE Provide calm/safe environment. Vital signs assessed twice daily. ED BHU Assessment once each 12-hour shift. Collaborate with TTS daily or as condition indicates. Assure the ED provider has rounded once each shift. Provide and encourage hygiene. Provide redirection as needed. Assess for escalating behavior; address immediately and inform ED provider.  Assess family dynamic and appropriateness for visitation as needed: Yes.  ; If necessary, describe findings:  Educate the patient/family about BHU procedures/visitation: Yes.  ; If necessary, describe findings:

## 2017-03-08 NOTE — ED Notes (Signed)
CT informed pt has completed one bottle of oral contrast.

## 2017-03-08 NOTE — ED Notes (Signed)
"  I don't want my medicine this morning - I don't feel good in my stomach and I don't want any medicine or food."   I encouraged her to eat because food will help her stomach feel better   She shook her head  No

## 2017-03-08 NOTE — ED Notes (Signed)
Pt taking sips of oral ct contrast with much encouragement.

## 2017-03-08 NOTE — ED Notes (Signed)
IVC/  PENDING  PLACEMENT 

## 2017-03-08 NOTE — BH Assessment (Addendum)
Writer received copy of patient's most recent Psychological Assessment from [facility]  [facility] ([phone]). They haven't received the faxed at this time, due to their system "being down."  Faxed to [phone], [phone] & [phone]  Was advised to call back.  A copy of the information that was faxed from [facility] was taking to medical records to be scanned into patient's chart. [facility] faxed 145 pages, that was [facility].   Writer called and left a HIPPA Compliant message with [facility](Collin [phone]), requesting a return phone call.

## 2017-03-09 LAB — GLUCOSE, CAPILLARY
GLUCOSE-CAPILLARY: 106 mg/dL — AB (ref 65–99)
GLUCOSE-CAPILLARY: 67 mg/dL (ref 65–99)
GLUCOSE-CAPILLARY: 64 mg/dL — AB (ref 65–99)
GLUCOSE-CAPILLARY: 67 mg/dL (ref 65–99)
GLUCOSE-CAPILLARY: 74 mg/dL (ref 65–99)
GLUCOSE-CAPILLARY: 70 mg/dL (ref 65–99)
Glucose-Capillary: 75 mg/dL (ref 65–99)
Glucose-Capillary: 77 mg/dL (ref 65–99)
Glucose-Capillary: 69 mg/dL (ref 65–99)

## 2017-03-09 MED ORDER — GLUCOSE 4 G PO CHEW
1.0000 | CHEWABLE_TABLET | ORAL | Status: DC | PRN
  Administered 2017-03-09 – 2017-03-11 (×12): 4 g via ORAL
  Filled 2017-03-09 (×2): qty 1

## 2017-03-09 MED ORDER — GLUCOSE-VITAMIN C 4-6 GM-MG PO CHEW: 1.0000 | CHEWABLE_TABLET | Freq: Four times a day (QID) | ORAL | Status: DC | PRN

## 2017-03-09 MED ORDER — GLUCAGON HCL RDNA (DIAGNOSTIC) 1 MG IJ SOLR: 1.0000 mg | Freq: Once | INTRAMUSCULAR | Status: DC | PRN

## 2017-03-09 MED ORDER — DEXTROSE 50 % IV SOLN: 50.0000 mL | Freq: Once | INTRAVENOUS | Status: DC

## 2017-03-09 NOTE — ED Notes (Signed)
fsbs 70  md aware.  Sugar pill given again.

## 2017-03-09 NOTE — ED Notes (Signed)
ivc/pending placement.. 

## 2017-03-09 NOTE — ED Notes (Addendum)
Patient still refusing to eat or drink anything at this time. Patient only asking for water. Dex4 chewable tablet will be given. MD made aware

## 2017-03-09 NOTE — ED Notes (Addendum)
FSBS 69. md aware.  Pt alert. Pt refusing to eat. Sugar pill given to pt.  md aware

## 2017-03-09 NOTE — ED Notes (Signed)
Pt awake and resting quietly.

## 2017-03-09 NOTE — ED Notes (Signed)
Resumed care from amber rn.  Pt alert

## 2017-03-09 NOTE — ED Provider Notes (Addendum)
  Physical Exam  BP 103/73 (BP Location: Left Arm)   Pulse 95   Temp 98.6 F (37 C) (Oral)   Resp 18   Ht 5\' 3"  (1.6 m)   Wt 127 kg (280 lb)   SpO2 95%   BMI 49.60 kg/m   Physical Exam  ED Course  Procedures  MDM Patient came previously for refusal to eat. Patient started on psych meds. Glucose briefly low and is stable this morning. Will continue to monitor CBG and if she continues to drop, may need D5 drip. Otherwise, still pending psych admission.   3:08 PM Patient's glucose dropped to 64. Still refusing to eat. Willing to take glucose tablets. Will continue to monitor.      Charlynne PanderYao, Nyal Schachter Hsienta, MD 03/09/17 [id number]    Charlynne PanderYao, Tighe Gitto Hsienta, MD 03/09/17 [id number]

## 2017-03-09 NOTE — ED Notes (Signed)
Pt took meds without water.

## 2017-03-10 LAB — GLUCOSE, CAPILLARY
GLUCOSE-CAPILLARY: 63 mg/dL — AB (ref 65–99)
GLUCOSE-CAPILLARY: 62 mg/dL — AB (ref 65–99)
Glucose-Capillary: 65 mg/dL (ref 65–99)
Glucose-Capillary: 67 mg/dL (ref 65–99)
Glucose-Capillary: 68 mg/dL (ref 65–99)
Glucose-Capillary: 70 mg/dL (ref 65–99)
Glucose-Capillary: 72 mg/dL (ref 65–99)

## 2017-03-10 NOTE — ED Notes (Signed)
Patient's CBG 67. Patient given glucose tab. NAD noted. Patient resting in bed.

## 2017-03-10 NOTE — BH Assessment (Signed)
Clinician contacted [facility] to f/u on pt referral Megan Manning([name] [phone]). Representative unable to find pt's referral Information. Clinician explained pt referral was submitted on 8/13 and TTS faxed psych eval and spoke with [name] on 8.14. Representative to "research" pt referral and call back with update.

## 2017-03-10 NOTE — ED Notes (Signed)
Report off to kailin rn 

## 2017-03-10 NOTE — ED Provider Notes (Signed)
-----------------------------------------   6:41 AM on 03/10/2017 -----------------------------------------   Blood pressure 92/70, pulse 86, temperature 99.1 F (37.3 C), temperature source Oral, resp. rate 18, height 5\' 3"  (1.6 m), weight 127 kg (280 lb), SpO2 96 %.  The patient had no acute events since last update. I am told that the patient still refuses to eat. Agreeable to taking glucose tablets. We are now checking blood sugars every 2 hours. Sleeping at this time.  Disposition is pending Psychiatry/Behavioral Medicine team recommendations.     Irean HongSung, Jade J, MD 03/10/17 [phone]
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
## 2017-03-10 NOTE — ED Notes (Signed)
Patient refused lunch, placed in room.

## 2017-03-10 NOTE — ED Notes (Signed)
[facility] pending placement 

## 2017-03-10 NOTE — ED Notes (Signed)
Pharmacy notified to send Depakote dose.

## 2017-03-10 NOTE — ED Notes (Addendum)
Pt got up and walked to the bathroom. Pt still refusing to eat.

## 2017-03-10 NOTE — ED Notes (Signed)
Blood sugar was 65. Per Dr Dolores FrameSung give 1 sugar tablet.

## 2017-03-10 NOTE — ED Notes (Signed)
fsbs 72  Dr sung aware.  No sugar pill at this time per dr sung.

## 2017-03-11 DIAGNOSIS — F4325 Adjustment disorder with mixed disturbance of emotions and conduct: Secondary | ICD-10-CM

## 2017-03-11 LAB — GLUCOSE, CAPILLARY
GLUCOSE-CAPILLARY: 77 mg/dL (ref 65–99)
GLUCOSE-CAPILLARY: 79 mg/dL (ref 65–99)
GLUCOSE-CAPILLARY: 76 mg/dL (ref 65–99)
GLUCOSE-CAPILLARY: 71 mg/dL (ref 65–99)
Glucose-Capillary: 65 mg/dL (ref 65–99)
Glucose-Capillary: 65 mg/dL (ref 65–99)
Glucose-Capillary: 78 mg/dL (ref 65–99)
Glucose-Capillary: 74 mg/dL (ref 65–99)

## 2017-03-11 LAB — BASIC METABOLIC PANEL
ANION GAP: 13 (ref 5–15)
BUN: 13 mg/dL (ref 6–20)
CO2: 17 mmol/L — ABNORMAL LOW (ref 22–32)
Calcium: 9.2 mg/dL (ref 8.9–10.3)
Chloride: 108 mmol/L (ref 101–111)
Creatinine, Ser: 0.67 mg/dL (ref 0.44–1.00)
GFR calc Af Amer: >60 mL/min (ref >60)
Glucose, Bld: 64 mg/dL — ABNORMAL LOW (ref 65–99)
POTASSIUM: 3.9 mmol/L (ref 3.5–5.1)
SODIUM: 138 mmol/L (ref 135–145)

## 2017-03-11 LAB — POC URINE PREG, ED: Preg Test, Ur: NEGATIVE

## 2017-03-11 MED ORDER — GLUCAGON HCL (RDNA) 1 MG IJ SOLR
1.0000 mg | Freq: Four times a day (QID) | INTRAMUSCULAR | Status: DC
  Administered 2017-03-11 (×2): 1 mg via INTRAMUSCULAR
  Filled 2017-03-11 (×10): qty 1

## 2017-03-11 MED ORDER — GLUCAGON HCL RDNA (DIAGNOSTIC) 1 MG IJ SOLR
INTRAMUSCULAR | Status: AC
  Filled 2017-03-11: qty 1

## 2017-03-11 NOTE — ED Notes (Addendum)
Pt still refusing to eat. Will not drink anything other than water. Dr Lenard Lancepaduchowski aware

## 2017-03-11 NOTE — ED Notes (Signed)
RN in room. Pt refusing to answer any questions from RN at this time. Refusing to eat breakfast despite encouraging because of low blood sugars.

## 2017-03-11 NOTE — BH Assessment (Signed)
Per Dr.Clapacs pt is is in need of social work consult and is recommended to d/c home.

## 2017-03-11 NOTE — ED Provider Notes (Signed)
-----------------------------------------   9:18 AM on 03/11/2017 -----------------------------------------   Blood pressure 117/75, pulse 88, temperature 98.7 F (37.1 C), temperature source Oral, resp. rate 17, height 5\' 3"  (1.6 m), weight 127 kg (280 lb), SpO2 95 %.  The patient had no acute events since last update.  Patient continues to refuse to eat at times. CBG remains within normal limits, we continue to await placement.     Minna AntisPaduchowski, Doneta Bayman, MD 03/11/17 [phone]

## 2017-03-11 NOTE — ED Notes (Signed)
Called and notified sister Yvone Neushanika, legal guardian of pt not eating and having to switch to shots per doctor recommendation.
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
## 2017-03-11 NOTE — ED Notes (Signed)
Pt still refusing to eat or drink anything.

## 2017-03-11 NOTE — ED Notes (Signed)
Pt requesting food. Dinner tray warmed and pt ate all of tray. Also graham crackers and peanut butter as well as an ice pop.

## 2017-03-11 NOTE — ED Notes (Signed)
Pt c/o feeling dizzy.  Has not been eating.  Still refuses to eat. Dr Mayford Knifewilliams notified and IM glucagon will be scheduled until pt begins to eat.

## 2017-03-11 NOTE — ED Notes (Signed)
Pt. Given glucose chewable tablet 4 g.

## 2017-03-11 NOTE — ED Notes (Signed)
Pt drank some water

## 2017-03-11 NOTE — ED Notes (Signed)
Dr clapacs at bedside 

## 2017-03-12 DIAGNOSIS — F4325 Adjustment disorder with mixed disturbance of emotions and conduct: Secondary | ICD-10-CM | POA: Diagnosis not present

## 2017-03-12 LAB — GLUCOSE, CAPILLARY
GLUCOSE-CAPILLARY: 107 mg/dL — AB (ref 65–99)
GLUCOSE-CAPILLARY: 125 mg/dL — AB (ref 65–99)
GLUCOSE-CAPILLARY: 126 mg/dL — AB (ref 65–99)
GLUCOSE-CAPILLARY: 154 mg/dL — AB (ref 65–99)
Glucose-Capillary: 175 mg/dL — ABNORMAL HIGH (ref 65–99)
Glucose-Capillary: 115 mg/dL — ABNORMAL HIGH (ref 65–99)
Glucose-Capillary: 124 mg/dL — ABNORMAL HIGH (ref 65–99)

## 2017-03-12 NOTE — ED Notes (Signed)
PT  VOL/  PENDING  PLACEMENT 

## 2017-03-12 NOTE — ED Notes (Signed)
Pt given 2 apple juices per patient request.

## 2017-03-12 NOTE — ED Notes (Signed)
Asked pt to take shower pt refused

## 2017-03-12 NOTE — ED Notes (Signed)
Pt repeatedly requesting apple juice. This RN explained patient can have apple juice with night time snack multiple times. Pt states understanding, then will call again again.

## 2017-03-12 NOTE — ED Notes (Signed)
Pt can be heard saying "this fucking bitch knows I have goddamn heart burn and she wont do nothing about it, fuck this shit, I told her ass about my fucking heart burn and she don't fucking listen, fucking bitch." This RN to bedside and closed the door, reminding patient that she could not speak to staff or other patients that way.

## 2017-03-12 NOTE — ED Notes (Signed)
Pt took medications, given 2 cups of apple juices, immediately asked for 2 more, when this RN explained pt could not have 2 more cups at this time, pt immediately began to complain of heartburn and feeling like her pills were stuck, pt is noted to maintain her own saliva without difficulty, is speaking without difficulty, when offered water, pt refuses, stating, "i think the apple juice would help more". Will notify MD.

## 2017-03-12 NOTE — ED Notes (Signed)
Pt given a snack and 2 apple juices per her request. Will continue to monitor for further patient needs.
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
## 2017-03-12 NOTE — ED Notes (Signed)
Pt CBG 154, MD made aware.

## 2017-03-12 NOTE — ED Notes (Signed)
VOL/Pending placement 

## 2017-03-12 NOTE — ED Notes (Signed)
Dr. Derrill Kay made aware of patient complaints of heartburn. No new orders received.

## 2017-03-12 NOTE — ED Notes (Signed)
Pt requesting apple juice, this RN explained to patient that she would have to take her medications prior to getting apple juice, pt refused to take medications due to c/o heart burn from medications this morning. MD made aware of patient c/o heart burn. Pt requesting more apple juice, this RN explained if patient was having heartburn and refused to take medications she could not have more apple juice. Pt then refused medications and refused to speak to this RN. This RN left a glass of water at bedside for patient.

## 2017-03-12 NOTE — ED Provider Notes (Signed)
-----------------------------------------   6:04 AM on 03/12/2017 -----------------------------------------   Blood pressure 127/82, pulse (!) 138, temperature 98.1 F (36.7 C), temperature source Oral, resp. rate 18, height 5\' 3"  (1.6 m), weight 127 kg (280 lb), SpO2 95 %.  The patient had no acute events since last update.  Sleeping at this time.  She was seen by Dr. Di Kindle who recommended discharge back home with her sister. Patient had been refusing to eat during her stay but she did eat when encouraged and told that she would be able to be discharged. However, evidently there was no one available to pick her up so patient remained in the emergency department overnight. Reviewed chart and noted TTS note stating Dr. Di Kindle requested clinical social worker consult. Order has been placed.   Irean Hong, MD 03/12/17 [phone]
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
## 2017-03-13 LAB — URINALYSIS, ROUTINE W REFLEX MICROSCOPIC
Bilirubin Urine: NEGATIVE
GLUCOSE, UA: NEGATIVE mg/dL
Ketones, ur: NEGATIVE mg/dL
NITRITE: NEGATIVE
PROTEIN: 30 mg/dL — AB
SPECIFIC GRAVITY, URINE: 1.028 (ref 1.005–1.030)
pH: 6 (ref 5.0–8.0)

## 2017-03-13 LAB — GLUCOSE, CAPILLARY
Glucose-Capillary: 100 mg/dL — ABNORMAL HIGH (ref 65–99)
Glucose-Capillary: 116 mg/dL — ABNORMAL HIGH (ref 65–99)
Glucose-Capillary: 102 mg/dL — ABNORMAL HIGH (ref 65–99)
Glucose-Capillary: 99 mg/dL (ref 65–99)

## 2017-03-13 MED ORDER — FAMOTIDINE 20 MG PO TABS
20.0000 mg | ORAL_TABLET | Freq: Once | ORAL | Status: AC
  Administered 2017-03-13: 20 mg via ORAL
  Filled 2017-03-13: qty 1

## 2017-03-13 MED ORDER — CEPHALEXIN 500 MG PO CAPS
500.0000 mg | ORAL_CAPSULE | Freq: Two times a day (BID) | ORAL | Status: DC
  Filled 2017-03-13: qty 1

## 2017-03-13 MED ORDER — HALOPERIDOL LACTATE 5 MG/ML IJ SOLN
5.0000 mg | Freq: Once | INTRAMUSCULAR | Status: AC
  Administered 2017-03-14: 5 mg via INTRAMUSCULAR
  Filled 2017-03-13: qty 1

## 2017-03-13 MED ORDER — CEFTRIAXONE SODIUM 1 G IJ SOLR: 1.0000 g | Freq: Two times a day (BID) | INTRAMUSCULAR | Status: DC

## 2017-03-13 MED ORDER — CEFTRIAXONE SODIUM 1 G IJ SOLR
1.0000 g | INTRAMUSCULAR | Status: DC
  Administered 2017-03-14 – 2017-03-15 (×2): 1 g via INTRAMUSCULAR
  Filled 2017-03-13 (×2): qty 10

## 2017-03-13 NOTE — ED Notes (Signed)
Pt asked to use to the phone, due to patient having asked to use the phone prior to 1500, this RN allowed patient to use the phone. Pt attempted to dial the number

## 2017-03-13 NOTE — ED Notes (Signed)
BEHAVIORAL HEALTH ROUNDING  Patient sleeping: No.  Patient alert and oriented: yes  Behavior appropriate: Yes. ; If no, describe:  Nutrition and fluids offered: Yes  Toileting and hygiene offered: Yes  Sitter present: not applicable, Q 15 min safety rounds and observation.  Law enforcement present: Yes ODS  
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
## 2017-03-13 NOTE — ED Provider Notes (Signed)
-----------------------------------------   4:48 AM on 03/13/2017 -----------------------------------------   BP 123/80 (BP Location: Right Arm)   Pulse (!) 108   Temp 99.2 F (37.3 C) (Oral)   Resp 18   Ht 5\' 3"  (1.6 m)   Wt 127 kg (280 lb)   SpO2 95%   BMI 49.60 kg/m   Patient with complaint of heart burn yesterday evening. No other acute events. Awaiting placement.     Phineas Semen, MD 03/13/17 [phone]

## 2017-03-13 NOTE — ED Notes (Signed)
ENVIRONMENTAL ASSESSMENT  Potentially harmful objects out of patient reach: Yes.  Personal belongings secured: Yes.  Patient dressed in hospital provided attire only: Yes.  Plastic bags out of patient reach: Yes.  Patient care equipment (cords, cables, call bells, lines, and drains) shortened, removed, or accounted for: Yes.  Equipment and supplies removed from bottom of stretcher: Yes.  Potentially toxic materials out of patient reach: Yes.  Sharps container removed or out of patient reach: Yes.   BEHAVIORAL HEALTH ROUNDING  Patient sleeping: No.  Patient alert and oriented: yes  Behavior appropriate: Yes. ; If no, describe:  Nutrition and fluids offered: Yes  Toileting and hygiene offered: Yes  Sitter present: not applicable, Q 15 min safety rounds and observation.  Law enforcement present: Yes ODS  ED BHU PLACEMENT JUSTIFICATION  Is the patient under IVC or is there intent for no.  Is the patient medically cleared: Yes.  Is there vacancy in the ED BHU: Yes.  Is the population mix appropriate for patient: no.  Is the patient awaiting placement in inpatient or outpatient setting: Yes.  Has the patient had a psychiatric consult: Yes.  Survey of unit performed for contraband, proper placement and condition of furniture, tampering with fixtures in bathroom, shower, and each patient room: Yes. ; Findings: All clear  APPEARANCE/BEHAVIOR  calm, cooperative and adequate rapport can be established  NEURO ASSESSMENT  Orientation: time, place and person  Hallucinations: No.None noted (Hallucinations)  Speech: Normal  Gait: normal  RESPIRATORY ASSESSMENT  WNL  CARDIOVASCULAR ASSESSMENT  WNL  GASTROINTESTINAL ASSESSMENT  WNL  EXTREMITIES  WNL  PLAN OF CARE  Provide calm/safe environment. Vital signs assessed TID. ED BHU Assessment once each 12-hour shift. Collaborate with TTS daily or as condition indicates. Assure the ED provider has rounded once each shift. Provide and encourage  hygiene. Provide redirection as needed. Assess for escalating behavior; address immediately and inform ED provider.  Assess family dynamic and appropriateness for visitation as needed: Yes. ; If necessary, describe findings:  Educate the patient/family about BHU procedures/visitation: Yes. ; If necessary, describe findings: Pt is calm and cooperative at this time. Will continue to monitor with Q 15 min safety rounds and observation.

## 2017-03-13 NOTE — ED Notes (Signed)
This RN notified that patient is complaining of burning with urination and discharge from her breasts. No wetness noted to her scrub top. Dr. Cyril Loosen notified of patient complaints, VORB for UA received.
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
## 2017-03-13 NOTE — ED Notes (Signed)
Pt resting in bed, lights dimmed for patient comfort. Pt denies any needs at this time. Pt c/o swelling to bilateral feet and leg pain. +pulses, movement, and sensation noted, slight swelling noted bilaterally. Will continue to monitor.

## 2017-03-13 NOTE — ED Notes (Signed)
Pt repeatedly asked for apple juice, when this RN explained that patient could have apple juice with dinner pt became visibly agitated. Pt then began to repeatedly state to this RN, "I think you're a racist you redneck! You're a racist and you're doing this on purpose!". Pt asked to speak with charge RN, Dorinda Hill, RN came to bedside to speak with patient, pt told Dorinda Hill, that this RN was a racist and a redneck. After Dorinda Hill, RN departed from bedside pt then began to stand in doorway and yell at this RN, Cordelia Pen, tech, and ODS officer for speaking to different people stating, "Don't talk about me! I know you're talking shit about me!". Pt then asked again for apple juice, this RN explained once again that patient could have apple juice with dinner. Pt back to her room at this time.

## 2017-03-13 NOTE — ED Notes (Signed)
CNA gave patient peanut butter, crackers, paper scrubs and apple juice.

## 2017-03-13 NOTE — ED Notes (Signed)
In room to give pt her medications and pt refused to take them. Pt encouraged to take meds but states she does not want to. Explained that she had medication for UTI but she still refused. Will attempt later. Dr Cyril Loosen informed.

## 2017-03-13 NOTE — ED Notes (Signed)
BEHAVIORAL HEALTH ROUNDING Patient sleeping: Yes.   Patient alert and oriented: not applicable SLEEPING Behavior appropriate: Yes.  ; If no, describe: SLEEPING Nutrition and fluids offered: No SLEEPING Toileting and hygiene offered: NoSLEEPING Sitter present: not applicable, Q 15 min safety rounds and observation. Law enforcement present: Yes ODS 

## 2017-03-13 NOTE — ED Notes (Signed)
Pt given meal tray and 2 apple juices per patient request. Pt is noted to be more calm, CBG 102. Will continue to monitor for further patient needs.

## 2017-03-13 NOTE — ED Notes (Signed)
BEHAVIORAL HEALTH ROUNDING  Patient sleeping: No.  Patient alert and oriented: yes  Behavior appropriate: No. ; If no, describe: yelling out and still refusing to take her medications.  Nutrition and fluids offered: Yes  Toileting and hygiene offered: Yes  Sitter present: not applicable, Q 15 min safety rounds and observation.  Law enforcement present: Yes ODS

## 2017-03-13 NOTE — ED Notes (Signed)
Pt refusing to have VS checked.

## 2017-03-14 LAB — GLUCOSE, CAPILLARY: Glucose-Capillary: 94 mg/dL (ref 65–99)

## 2017-03-14 MED ORDER — POLYETHYLENE GLYCOL 3350 17 G PO PACK
17.0000 g | PACK | Freq: Every day | ORAL | Status: DC
  Administered 2017-03-15 – 2017-03-16 (×2): 17 g via ORAL
  Filled 2017-03-14 (×3): qty 1

## 2017-03-14 MED ORDER — LIDOCAINE HCL (PF) 1 % IJ SOLN
INTRAMUSCULAR | Status: AC
  Filled 2017-03-14: qty 5

## 2017-03-14 NOTE — ED Notes (Signed)
Pt still awake but not yelling out at this time. Given water and a blanket. Encouraged to lay on the bed and rest.

## 2017-03-14 NOTE — ED Notes (Signed)
BEHAVIORAL HEALTH ROUNDING Patient sleeping: Yes.   Patient alert and oriented: not applicable SLEEPING Behavior appropriate: Yes.  ; If no, describe: SLEEPING Nutrition and fluids offered: No SLEEPING Toileting and hygiene offered: NoSLEEPING Sitter present: not applicable, Q 15 min safety rounds and observation. Law enforcement present: Yes ODS 

## 2017-03-14 NOTE — ED Provider Notes (Signed)
-----------------------------------------   7:17 AM on 03/14/2017 -----------------------------------------   Blood pressure 124/67, pulse 72, temperature 98.6 F (37 C), temperature source Oral, resp. rate 18, height 5\' 3"  (1.6 m), weight 127 kg (280 lb), SpO2 99 %.  The patient had no acute events since last update.  Calm and cooperative at this time.  Continues to refuse meds. Abx for UTI switched to rocephin IM. Disposition is pending per Psychiatry/Behavioral Medicine team recommendations.     Nita Sickle, MD 03/14/17 [phone]

## 2017-03-14 NOTE — ED Notes (Signed)
Pt resting on bed with eyes closed at this time

## 2017-03-14 NOTE — ED Notes (Signed)
Pt awake;calm and cooperative; pt took Depakote that was refused earlier; given juice and crackers; continues to refuse to have blood sugar checked

## 2017-03-14 NOTE — ED Notes (Signed)
BEHAVIORAL HEALTH ROUNDING  Patient sleeping: No.  Patient alert and oriented: yes  Behavior appropriate: Yes. ; If no, describe:  Nutrition and fluids offered: Yes  Toileting and hygiene offered: Yes  Sitter present: not applicable, Q 15 min safety rounds and observation.  Law enforcement present: Yes ODS  

## 2017-03-14 NOTE — ED Notes (Signed)
Pt agitated at this time, slamming door in room.  Pt wanting more juice and crackers.  Pt informed by this RN that to get more crackers and juice she would need to take her remaining medications.  Pt refusing blood sugar checks and remaining medications.  Pt yelling and cussing at this RN.  Pt previously provided with 4 oz apple juice, 1 pack of graham crackers and 1 pad of peanut butter.

## 2017-03-14 NOTE — ED Notes (Signed)
Pt reported to Tammy, NT that she has not had a BM in 2-3 days.  EDP notified and order given for miralax.  Pt informed of this, but refusing miralax.  Pt states that it does not work for her.  Pt asked what medication does work for her.  Pt replied that she doesn't want any medication at this time.  Pt offered juice and pt denies wanting.  Pt also refusing blood sugar at this time.

## 2017-03-14 NOTE — ED Notes (Signed)
Pt continues to be demanding stating the staff is racist and calling this RN a B**ch. Charge RN Lea in to speak with the patient.

## 2017-03-14 NOTE — ED Notes (Signed)
BEHAVIORAL HEALTH ROUNDING  Patient sleeping: No.  Patient alert and oriented: yes  Behavior appropriate: no. ; If no, describe: calling out loudly, cursing and calling the staff names.  Nutrition and fluids offered: Yes  Toileting and hygiene offered: Yes  Sitter present: not applicable, Q 15 min safety rounds and observation.  Law enforcement present: Yes ODS

## 2017-03-14 NOTE — ED Notes (Signed)
Pt took injections with no resistance but refused to allow bandaids to be placed on the injection sites.

## 2017-03-14 NOTE — ED Notes (Signed)
BEHAVIORAL HEALTH ROUNDING  Patient sleeping: No.  Patient alert and oriented: yes  Behavior appropriate: no. ; If no, describe: calling out loudly, cursing and calling the staff names.  Nutrition and fluids offered: Yes  Toileting and hygiene offered: Yes  Sitter present: not applicable, Q 15 min safety rounds and observation.  Law enforcement present: Yes ODS 

## 2017-03-14 NOTE — ED Notes (Signed)
Pt still refusing to eat. Will not let staff check blood sugar or obtain vital signs.
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
## 2017-03-14 NOTE — ED Notes (Addendum)
Pt continues to refuse to take her medications. Continues to yell out she wants peanut butter and crackers. Calling staff names and using profanity.  Dr Don Perking informed and will order IM medications for her UTI and agitation.

## 2017-03-14 NOTE — ED Notes (Signed)
Pt refusing lunch. Refusing to get blood sugar checked.

## 2017-03-14 NOTE — ED Notes (Signed)
Pt ate breakfast.

## 2017-03-14 NOTE — Progress Notes (Signed)
LCSW touched base with patients guardian. She reports Megan Manning continues to actively look for housing and will be available on Monday ( doesn't work on weekends) LCSW called Megan Manning  [phone] and he will contact weekday CSW with update on 2 homes he is working on and hopes to have patient placed by Monday toTuesday. Contact information provided.   Will provide contact information to Weekday CSW.  Megan Rennert LCSW

## 2017-03-15 LAB — URINE CULTURE

## 2017-03-15 LAB — GLUCOSE, CAPILLARY
GLUCOSE-CAPILLARY: 119 mg/dL — AB (ref 65–99)
GLUCOSE-CAPILLARY: 89 mg/dL (ref 65–99)
Glucose-Capillary: 100 mg/dL — ABNORMAL HIGH (ref 65–99)
Glucose-Capillary: 100 mg/dL — ABNORMAL HIGH (ref 65–99)

## 2017-03-15 MED ORDER — ACETAMINOPHEN 325 MG PO TABS
ORAL_TABLET | ORAL | Status: AC
  Administered 2017-03-15: 650 mg via ORAL
  Filled 2017-03-15: qty 2

## 2017-03-15 MED ORDER — ACETAMINOPHEN 325 MG PO TABS
650.0000 mg | ORAL_TABLET | ORAL | Status: AC
Start: 2017-03-15 — End: 2017-03-15
  Administered 2017-03-15: 650 mg via ORAL

## 2017-03-15 MED ORDER — GI COCKTAIL ~~LOC~~
30.0000 mL | Freq: Once | ORAL | Status: AC
  Administered 2017-03-15: 30 mL via ORAL
  Filled 2017-03-15: qty 30

## 2017-03-15 NOTE — ED Notes (Signed)
Before this RN had a chance to check pt's PRN orders, pt has decided she no longer wants medication for heartburn; resting in bed quietly;

## 2017-03-15 NOTE — ED Provider Notes (Signed)
I was notified by nursing that the patient reported bilateral lower extremity swelling. Went to evaluate the patient and I do not appreciate any edema but she certainly does have adipose tissue in bilateral lower extremity. We'll continue to monitor.   Merrily Brittle, MD 03/15/17 [phone]

## 2017-03-15 NOTE — ED Notes (Signed)
Pt requesting check of blood sugar; performed by ED tech Ashlyn; pt requesting Tylenol for heartburn

## 2017-03-15 NOTE — ED Notes (Signed)
Pt easily awakened for recheck of vital signs

## 2017-03-15 NOTE — Progress Notes (Signed)
Clinical Social Worker (CSW) received a call from Megan Manning [phone] stating that he has 2 [facility] homes ([facility]) in [place] and [place]. Per Mr. Megan Manning he will come to [facility] tomorrow at 3 pm with his team to assess patient for placement in his [place] home. Per Mr. Megan Manning if the assessment goes well he will take patient with him from the ED tomorrow. Per Mr. Megan Manning a Megan Manning is not required for an AFL home. CSW attempted to contact patient's alliance care coordinator Megan Manning [phone] however he did not answer and a voicemail was left. CSW contacted the main number to [facility] [phone] and spoke to a representative who stated that he will send a high importance e-mail to [place]. CSW also contacted patient's sister Megan Manning [phone]. Per sister she is not the patient's guardian or HPOA. Sister reported that patient doesn't have a guardian or HPOA. CSW made sister aware of Mr. Megan Manning coming to assess patient tomorrow. Sister is agreeable for patient to D/C to [place] [facility] home if they can accept her. CSW discussed case with Megan Manning who stated that patient's IVC can be discontinued once placement is secured. Per Megan Manning patient is physiatrically cleared for discharge.  [employer], LCSW [phone]

## 2017-03-15 NOTE — ED Provider Notes (Signed)
-----------------------------------------   7:48 AM on 03/15/2017 -----------------------------------------   Blood pressure 93/65, pulse 85, temperature 98 F (36.7 C), temperature source Oral, resp. rate 18, height 5\' 3"  (1.6 m), weight 127 kg (280 lb), SpO2 97 %.  The patient had no acute events since last update.  Calm and cooperative at this time.  Disposition is pending Psychiatry/Behavioral Medicine team recommendations. Patient is awaiting acceptance at a facility.     Rebecka Apley, MD 03/15/17 [phone]

## 2017-03-15 NOTE — ED Notes (Signed)
Pt given IM injections of antibiotic without difficulty; pt reports bilateral leg swelling; nonpitting edema noted; Ann, RN who had pt last night says this is nothing new for pt; pt denies shortness of breath

## 2017-03-16 DIAGNOSIS — F4325 Adjustment disorder with mixed disturbance of emotions and conduct: Secondary | ICD-10-CM | POA: Diagnosis not present

## 2017-03-16 LAB — GLUCOSE, CAPILLARY
Glucose-Capillary: 131 mg/dL — ABNORMAL HIGH (ref 65–99)
Glucose-Capillary: 95 mg/dL (ref 65–99)

## 2017-03-16 MED ORDER — OMEPRAZOLE 20 MG PO CPDR: 20.0000 mg | DELAYED_RELEASE_CAPSULE | Freq: Every day | ORAL | 1 refills | Status: DC

## 2017-03-16 MED ORDER — LEVOTHYROXINE SODIUM 88 MCG PO TABS: 88.0000 ug | ORAL_TABLET | Freq: Every day | ORAL | 1 refills | Status: DC

## 2017-03-16 MED ORDER — LORATADINE 10 MG PO TABS: 10.0000 mg | ORAL_TABLET | Freq: Every day | ORAL | 1 refills | Status: DC

## 2017-03-16 MED ORDER — HALOPERIDOL 5 MG PO TABS: 5.0000 mg | ORAL_TABLET | Freq: Two times a day (BID) | ORAL | 1 refills | Status: DC

## 2017-03-16 MED ORDER — METFORMIN HCL 500 MG PO TABS: 500.0000 mg | ORAL_TABLET | Freq: Every day | ORAL | 1 refills | Status: DC

## 2017-03-16 MED ORDER — POLYETHYLENE GLYCOL 3350 17 G PO PACK: 17.0000 g | PACK | Freq: Every day | ORAL | 1 refills | Status: DC

## 2017-03-16 MED ORDER — MAGNESIUM OXIDE 400 MG PO TABS: 400.0000 mg | ORAL_TABLET | Freq: Every day | ORAL | 1 refills | Status: DC

## 2017-03-16 MED ORDER — DIVALPROEX SODIUM 125 MG PO CSDR: 250.0000 mg | DELAYED_RELEASE_CAPSULE | Freq: Two times a day (BID) | ORAL | 1 refills | Status: DC

## 2017-03-16 MED ORDER — BENZTROPINE MESYLATE 1 MG PO TABS: 1.0000 mg | ORAL_TABLET | Freq: Two times a day (BID) | ORAL | 1 refills | Status: DC

## 2017-03-16 NOTE — ED Notes (Signed)
BEHAVIORAL HEALTH ROUNDING Patient sleeping: No. Patient alert and oriented: yes Behavior appropriate: Yes.  ; If no, describe:  Nutrition and fluids offered: yes Toileting and hygiene offered: Yes  Sitter present: q15 minute observations and security  monitoring Law enforcement present: Yes  ODS  

## 2017-03-16 NOTE — Progress Notes (Signed)
Clinical Social Worker (CSW) received a call from patient's [facility] Care Coordinator Leia Alf on yesterday around 5:30 pm [phone]. Per Orpah Clinton he has talked with Mr. Manson Passey with the AFL home and has approved that. Per Orpah Clinton patient does not have a guardian or HPOA. Mr. Manson Passey has agreed to come assess patient today around 3 pm.   [facility], LCSW [phone]

## 2017-03-16 NOTE — ED Provider Notes (Signed)
-----------------------------------------   4:11 AM on 03/16/2017 -----------------------------------------   Blood pressure 110/72, pulse (!) 104, temperature 98.7 F (37.1 C), temperature source Oral, resp. rate 18, height 5\' 3"  (1.6 m), weight 127 kg (280 lb), SpO2 94 %.  The patient had no acute events since last update.  Calm and cooperative at this time.  Disposition is pending per Psychiatry/Behavioral Medicine team recommendations.     Don Perking, [place], MD 03/16/17 [phone]

## 2017-03-16 NOTE — ED Notes (Signed)
"  I have been eating so don't prick my finger this time"  Assessment completed  Am meds administered as ordered  She denies pain  NAD assessed

## 2017-03-16 NOTE — ED Notes (Signed)
Pt observed lying in bed - watching TV   2 apple juice provided   Pt visualized with NAD  No verbalized needs or concerns at this time  Continue to monitor

## 2017-03-16 NOTE — Progress Notes (Addendum)
Clinical Social Worker (CSW) met with Megan Manning [phone] and 2 other staff members at patient's bed side today. Patient stated that she would take her medicine and would "be good this time." Patient is agreeable to D/C to [name] home today. Patient doesn't have a guardian per her sister and care coordinator. [name] and staff members accepted patient to their [facility] ([facility]) home in [place]. Per Dr. Weber Cooks and ED-P patient is stable for D/C today and was never under IVC. Dr. Weber Cooks has agreed to write patient's prescriptions. RN aware of above. CSW left patient's [facility] Care Coordinator [name] a voicemail making him aware of above. Care Coordinator is agreeable for patient to D/C to the AFL home. CSW attempted to contact patient's sister [name] however she did not answer and a voicemail was left. Please reconsult if future social work needs arise. CSW signing off.   [employer], LCSW [phone]

## 2017-03-16 NOTE — ED Notes (Signed)
Patient was given the phone, returned when finished.

## 2017-03-16 NOTE — ED Notes (Signed)
BEHAVIORAL HEALTH ROUNDING Patient sleeping: Yes.   Patient alert and oriented: eyes closed  Appears to be asleep Behavior appropriate: Yes.  ; If no, describe:  Nutrition and fluids offered: Yes  Toileting and hygiene offered: sleeping Sitter present: q 15 minute observations and security monitoring Law enforcement present: yes  ODS 

## 2017-03-16 NOTE — ED Notes (Signed)
ED  Is the patient under IVC or is there intent for IVC: no Is the patient medically cleared: Yes.   Is there vacancy in the ED BHU: Yes.   Is the population mix appropriate for patient: Yes.   Is the patient awaiting placement in inpatient or outpatient setting: Yes.   Has the patient had a psychiatric consult: Yes.   Survey of unit performed for contraband, proper placement and condition of furniture, tampering with fixtures in bathroom, shower, and each patient room: Yes.  ; Findings:  APPEARANCE/BEHAVIOR Calm and cooperative NEURO ASSESSMENT Orientation: oriented x3  Denies pain Hallucinations: No.None noted (Hallucinations) Speech: Normal Gait: normal RESPIRATORY ASSESSMENT Even  Unlabored respirations  CARDIOVASCULAR ASSESSMENT Pulses equal   regular rate  Skin warm and dry   GASTROINTESTINAL ASSESSMENT no GI complaint EXTREMITIES Full ROM  PLAN OF CARE Provide calm/safe environment. Vital signs assessed twice daily. ED BHU Assessment once each 12-hour shift. Collaborate with TTS when available or as condition indicates. Assure the ED provider has rounded once each shift. Provide and encourage hygiene. Provide redirection as needed. Assess for escalating behavior; address immediately and inform ED provider.  Assess family dynamic and appropriateness for visitation as needed: Yes.  ; If necessary, describe findings:  Educate the patient/family about BHU procedures/visitation: Yes.  ; If necessary, describe findings:
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
## 2017-03-16 NOTE — ED Notes (Signed)

## 2017-03-16 NOTE — ED Provider Notes (Signed)
Discussed with psychiatry Dr. Toni Amend. Patient has been accepted to a assisted living facility. He has written necessary prescriptions for the patient. She does not have a guardian according to social work. She is medically stable for discharge home.   Sharman Cheek, MD 03/16/17 1622

## 2018-03-31 IMAGING — CT CT ABD-PELV W/ CM
2 of 4 series · 16 of 46 positions shown, 18 images · IV contrast (APPLIED)
Comparison: 02/14/2017

CLINICAL DATA: Generalized abdominal pain for 1 day. Recent
diagnosis of appendicitis treated medically.

EXAM:
CT ABDOMEN AND PELVIS WITH CONTRAST
TECHNIQUE: Multidetector CT imaging of the abdomen and pelvis was performed
using the standard protocol following bolus administration of
intravenous contrast.
CONTRAST:  100mL [id number] IOPAMIDOL ([id number]) INJECTION 61%

[Series 2: routine abd/pel with · axial · 0.77mm/px · z∈[-1043,-528]mm · 13 of 113 slices shown, 15 images]
[im 5/113  soft-tissue]
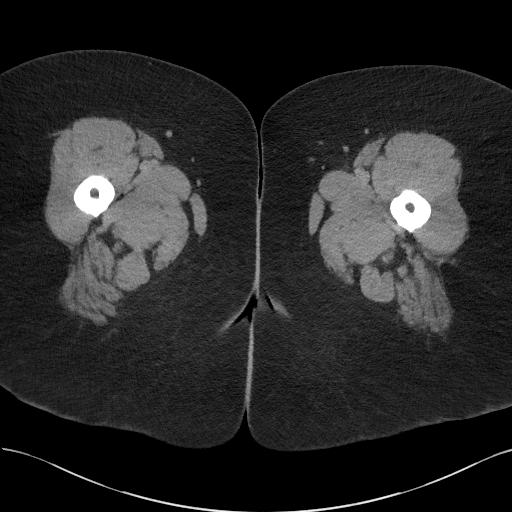
[im 5/113  bone]
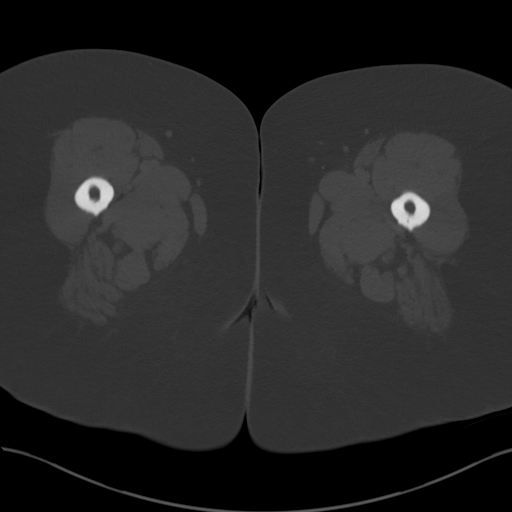
[im 15/113  soft-tissue]
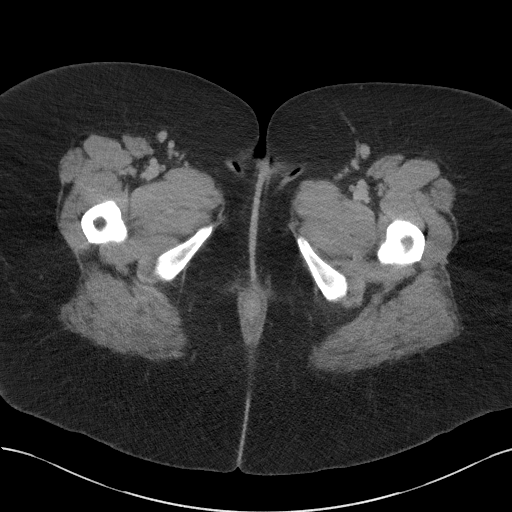
[im 25/113  soft-tissue]
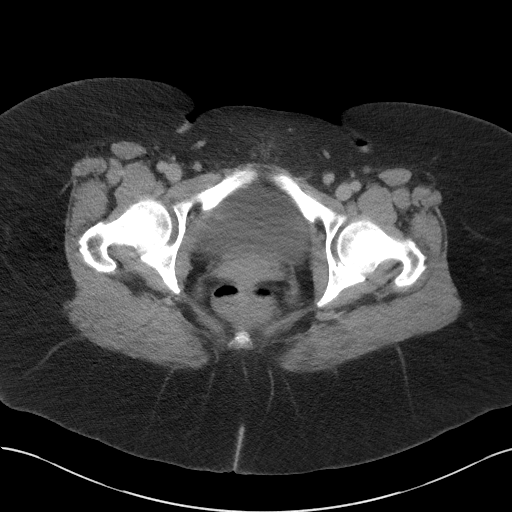
[im 30/113  soft-tissue]
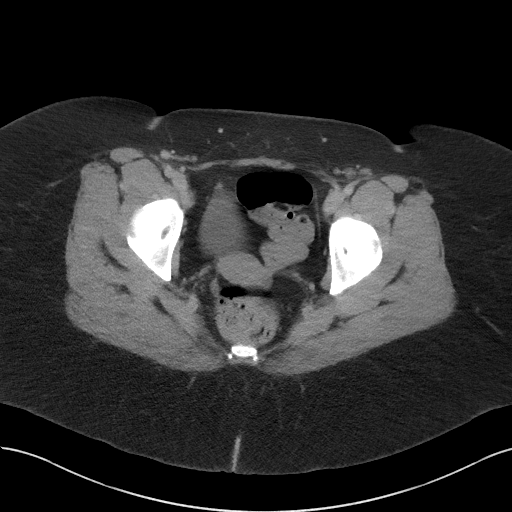
[im 39/113  soft-tissue]
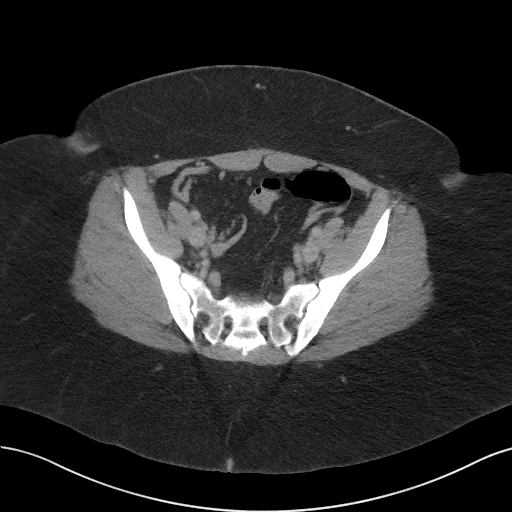
[im 49/113  soft-tissue]
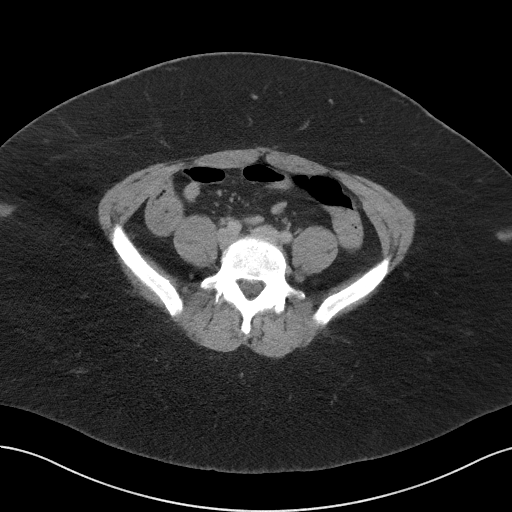
[im 59/113  soft-tissue]
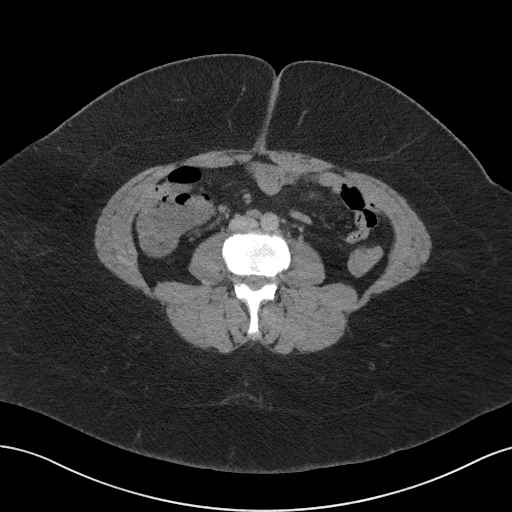
[im 64/113  soft-tissue]
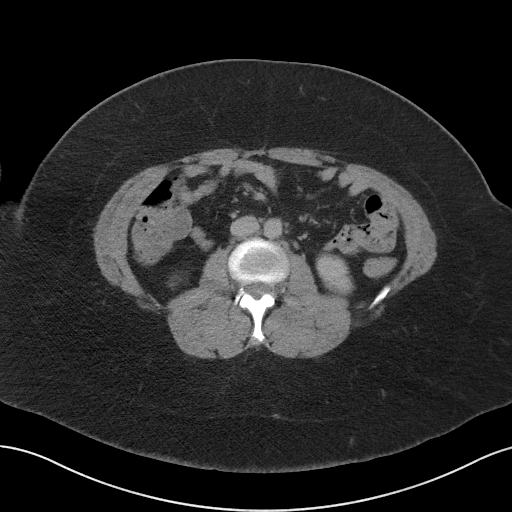
[im 74/113  soft-tissue]
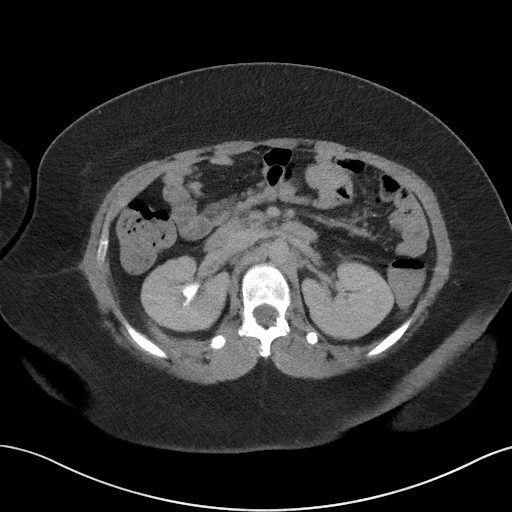
[im 74/113  bone]
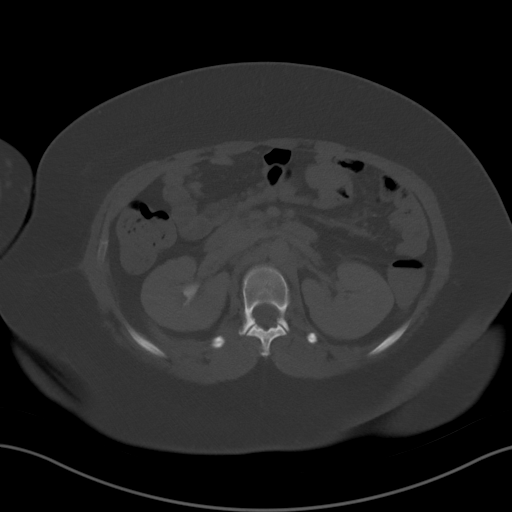
[im 83/113  soft-tissue]
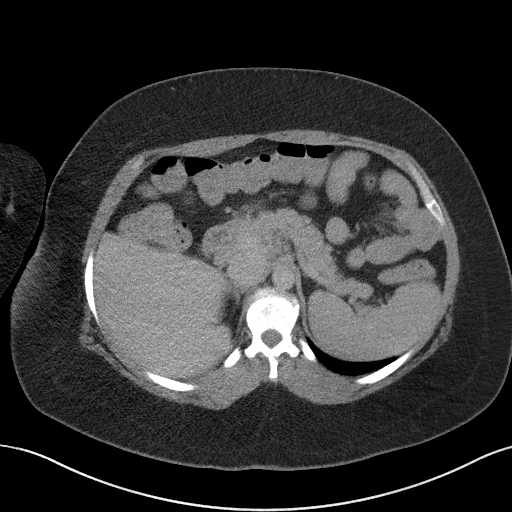
[im 88/113  soft-tissue]
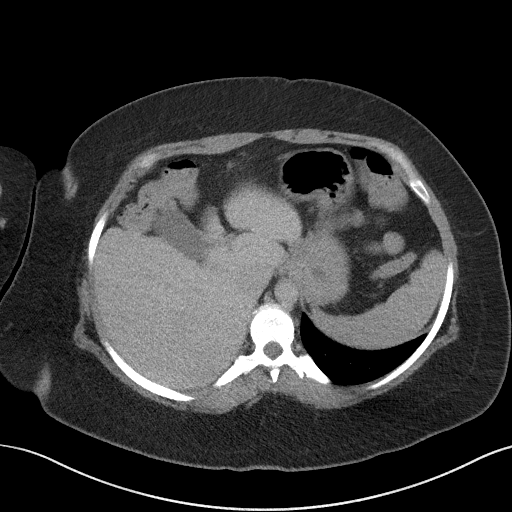
[im 98/113  soft-tissue]
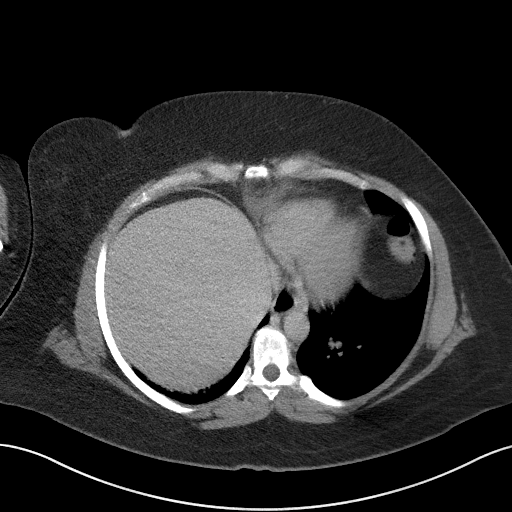
[im 108/113  soft-tissue]
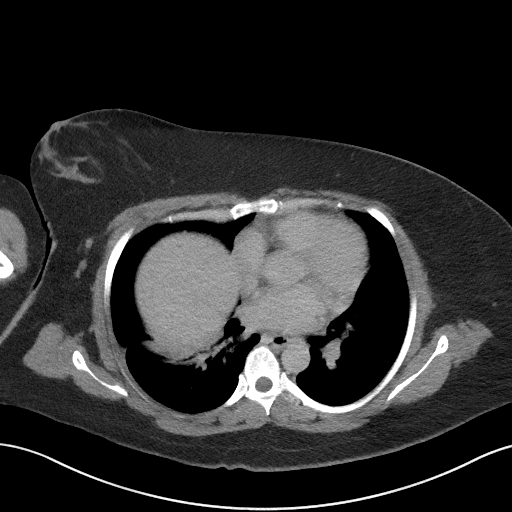

[Series 5: coronal st · coronal · 0.76mm/px · 3 of 106 slices shown]
[im 36/106  soft-tissue]
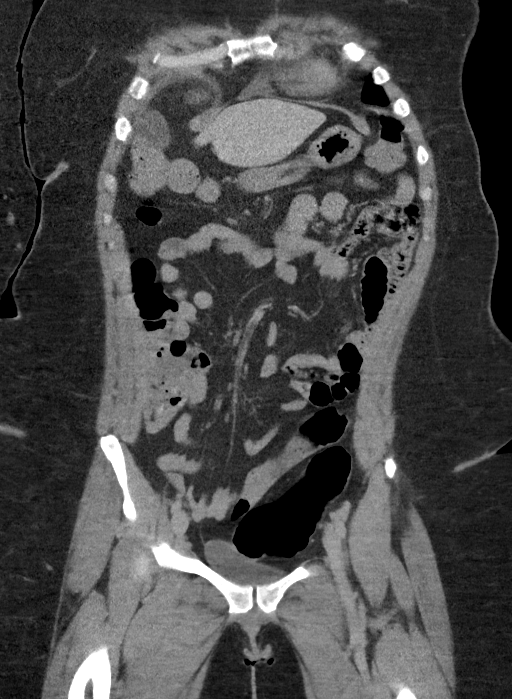
[im 47/106  soft-tissue]
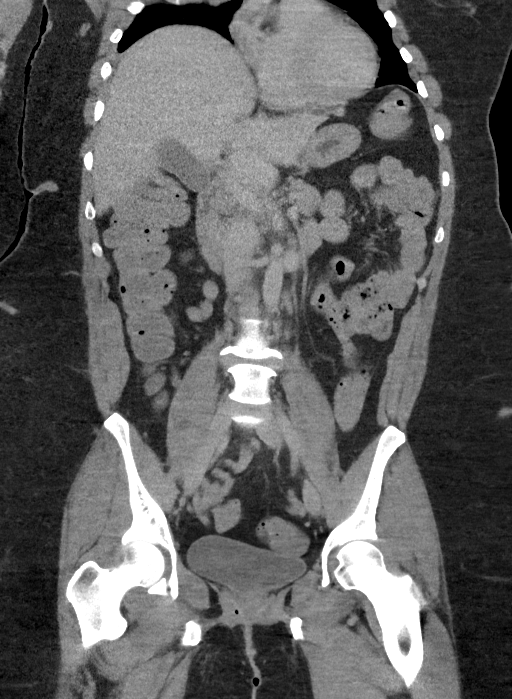
[im 59/106  soft-tissue]
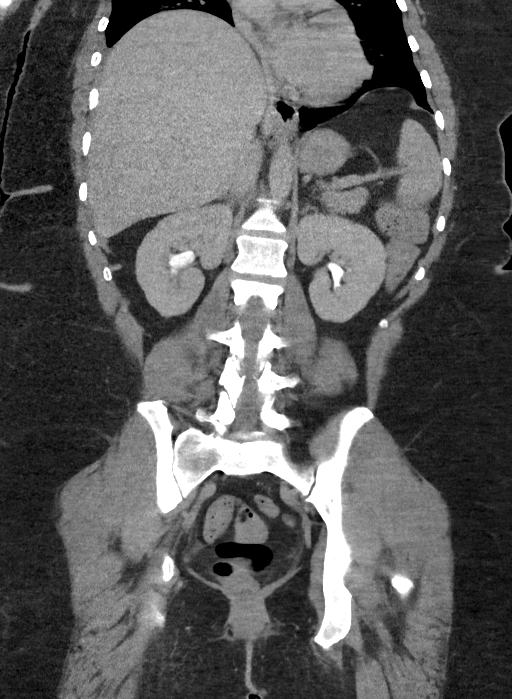

[16 of 46 positions shown; findings below may reference images not displayed]

FINDINGS: Lower chest: Infiltration or atelectasis in the right lung base
similar to prior study. Can't exclude pneumonia. Small esophageal
hiatal hernia.

Hepatobiliary: No focal liver abnormality is seen. No gallstones,
gallbladder wall thickening, or biliary dilatation.

Pancreas: Unremarkable. No pancreatic ductal dilatation or
surrounding inflammatory changes.

Spleen: Normal in size without focal abnormality.

Adrenals/Urinary Tract: Adrenal glands are unremarkable. Kidneys are
normal, without renal calculi, focal lesion, or hydronephrosis.
Bladder is unremarkable.

Stomach/Bowel: Stomach, small bowel, and colon are not abnormally
distended. Scattered stool throughout the colon. Appendix is normal.
No inflammatory stranding is demonstrated. No periappendiceal fluid
collections.

Vascular/Lymphatic: No significant vascular findings are present. No
enlarged abdominal or pelvic lymph nodes.

Reproductive: Uterus and bilateral adnexa are unremarkable.

Other: No abdominal wall hernia or abnormality. No abdominopelvic
ascites.

Musculoskeletal: No acute or significant osseous findings. Mild
degenerative changes in the spine.
IMPRESSION: Infiltration or atelectasis in the right lung base. Can't exclude
pneumonia in the appropriate clinical setting.

Normal appearance of the appendix. No inflammatory changes
identified. No evidence of bowel obstruction.

## 2018-11-17 ENCOUNTER — Other Ambulatory Visit: Payer: Self-pay

## 2018-11-17 ENCOUNTER — Telehealth (HOSPITAL_COMMUNITY): Payer: Self-pay | Admitting: Emergency Medicine

## 2018-11-17 ENCOUNTER — Ambulatory Visit (HOSPITAL_COMMUNITY)
Admission: EM | Admit: 2018-11-17 | Discharge: 2018-11-17 | Disposition: A | Discharge status: Home / Self Care | Payer: Medicaid Other | Attending: Internal Medicine | Admitting: Internal Medicine

## 2018-11-17 ENCOUNTER — Ambulatory Visit (INDEPENDENT_AMBULATORY_CARE_PROVIDER_SITE_OTHER): Payer: Medicaid Other

## 2018-11-17 ENCOUNTER — Encounter (HOSPITAL_COMMUNITY): Payer: Self-pay | Admitting: Emergency Medicine

## 2018-11-17 DIAGNOSIS — R0602 Shortness of breath: Secondary | ICD-10-CM | POA: Diagnosis not present

## 2018-11-17 HISTORY — DX: Adjustment disorder, unspecified: F43.20

## 2018-11-17 HISTORY — DX: Acidosis: E87.2

## 2018-11-17 HISTORY — DX: Poisoning by unspecified drugs, medicaments and biological substances, accidental (unintentional), initial encounter: T50.901A

## 2018-11-17 HISTORY — DX: Unspecified intellectual disabilities: F79

## 2018-11-17 HISTORY — DX: Acidosis, unspecified: E87.20

## 2018-11-17 HISTORY — DX: Borderline personality disorder: F60.3

## 2018-11-17 MED ORDER — TRAZODONE HCL 50 MG PO TABS: 50.0000 mg | ORAL_TABLET | Freq: Every day | ORAL | 0 refills | Status: DC

## 2018-11-17 MED ORDER — OLANZAPINE 20 MG PO TABS: 20.0000 mg | ORAL_TABLET | Freq: Every day | ORAL | 0 refills | Status: DC

## 2018-11-17 MED ORDER — LEVOTHYROXINE SODIUM 88 MCG PO TABS: 88.0000 ug | ORAL_TABLET | Freq: Every day | ORAL | 1 refills | Status: DC

## 2018-11-17 MED ORDER — PHENAZOPYRIDINE HCL 100 MG PO TABS: 200.0000 mg | ORAL_TABLET | Freq: Three times a day (TID) | ORAL | 0 refills | Status: DC

## 2018-11-17 MED ORDER — OLANZAPINE 5 MG PO TABS: 5.0000 mg | ORAL_TABLET | Freq: Every morning | ORAL | 0 refills | Status: DC

## 2018-11-17 MED ORDER — ONDANSETRON HCL 4 MG PO TABS: 4.0000 mg | ORAL_TABLET | Freq: Three times a day (TID) | ORAL | 0 refills | Status: DC | PRN

## 2018-11-17 MED ORDER — PHENAZOPYRIDINE HCL 100 MG PO TABS: 200.0000 mg | ORAL_TABLET | Freq: Three times a day (TID) | ORAL | 0 refills | Status: AC

## 2018-11-17 MED ORDER — FUROSEMIDE 40 MG PO TABS: 40.0000 mg | ORAL_TABLET | Freq: Every day | ORAL | 0 refills | Status: DC

## 2018-11-17 NOTE — Telephone Encounter (Signed)
Left message for caregiver that RX were printed for them to pick up.

## 2018-11-17 NOTE — ED Triage Notes (Signed)
Pt here for SOB and leg swelling x 4 days; pt sts some problem with her eye; pt has a care giver
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
## 2019-03-27 IMAGING — US US ABDOMEN LIMITED
1 series · 14 of 25 positions shown · non-contrast
Comparison: CT of the abdomen on 02/22/2017

CLINICAL DATA: Upper abdominal pain and right upper quadrant
tenderness.

EXAM:
ULTRASOUND ABDOMEN LIMITED RIGHT UPPER QUADRANT

[Series 1: us abdomen limited · 0.22mm/px · 14 of 44 slices shown]
[im 1/44]
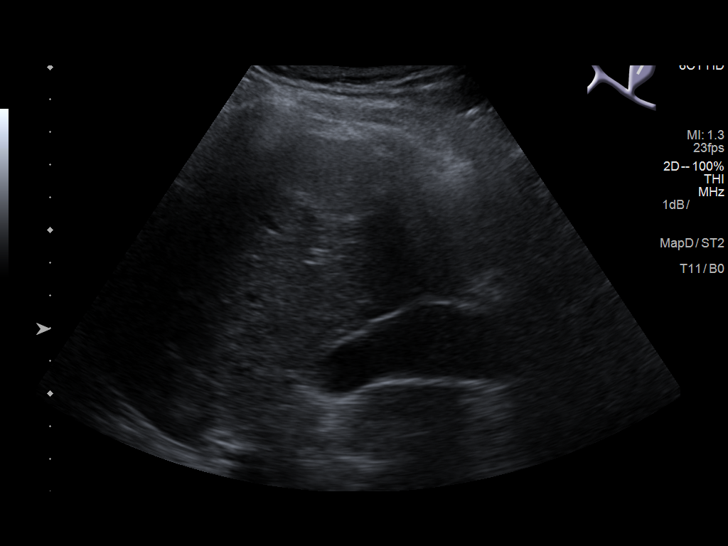
[im 4/44]
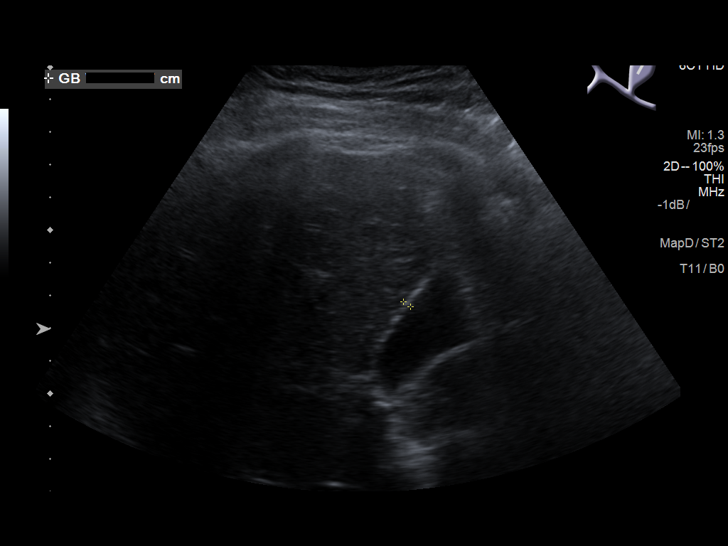
[im 8/44]
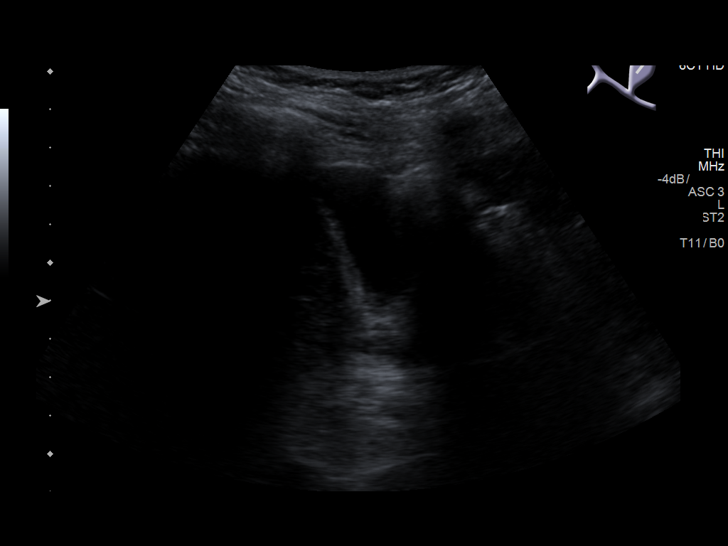
[im 11/44]
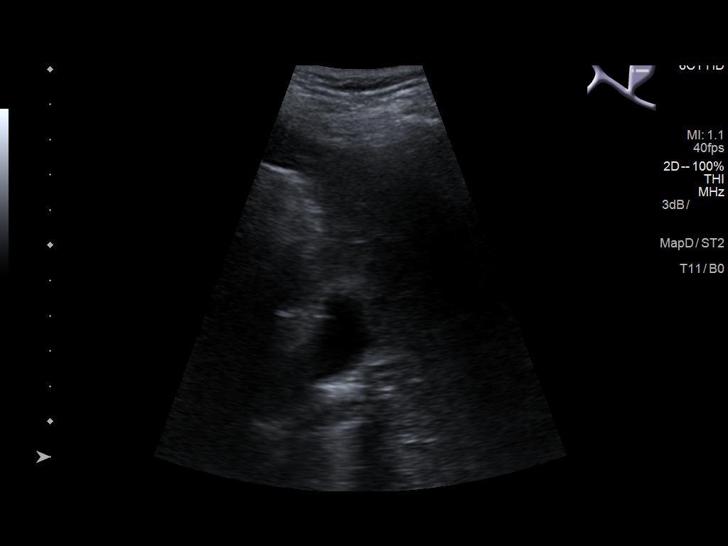
[im 15/44]
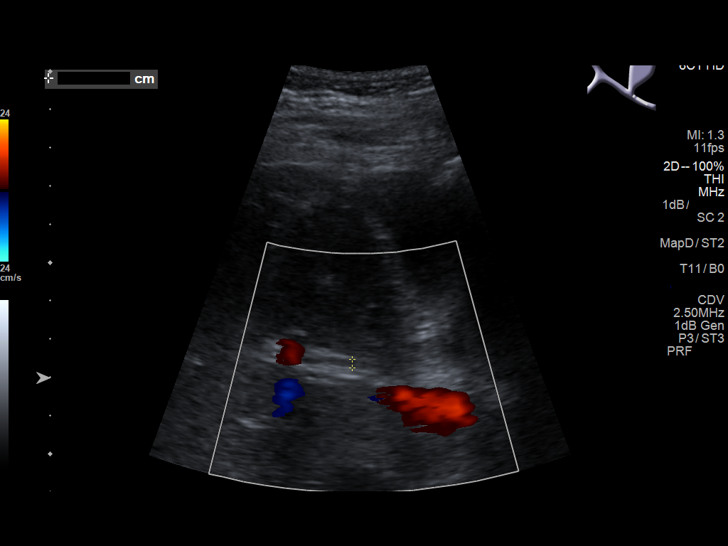
[im 17/44]
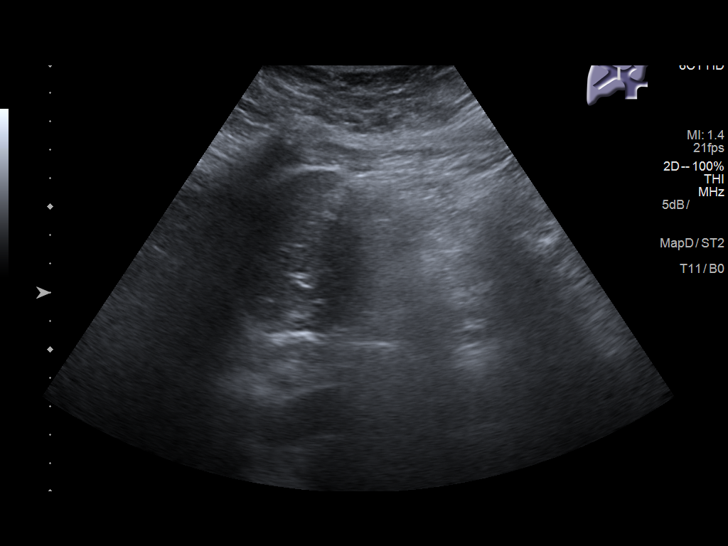
[im 20/44]
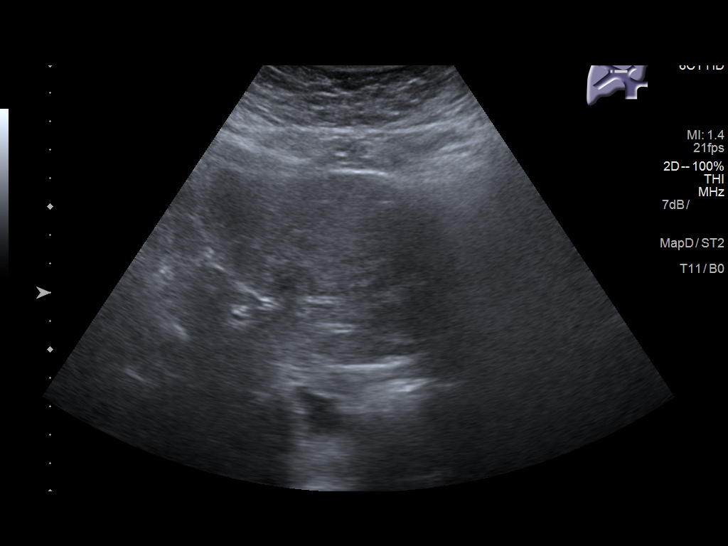
[im 24/44]
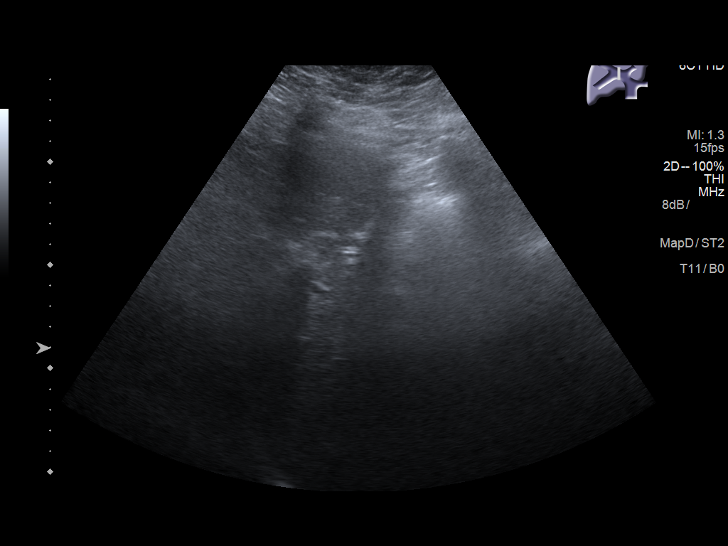
[im 27/44]
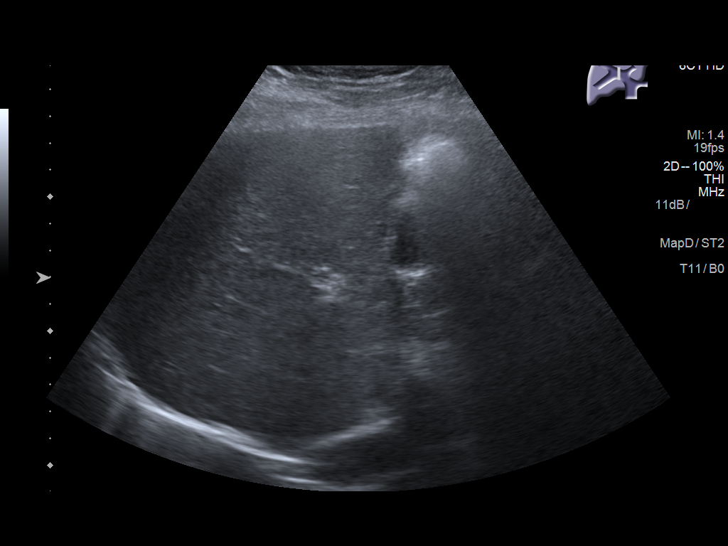
[im 29/44]
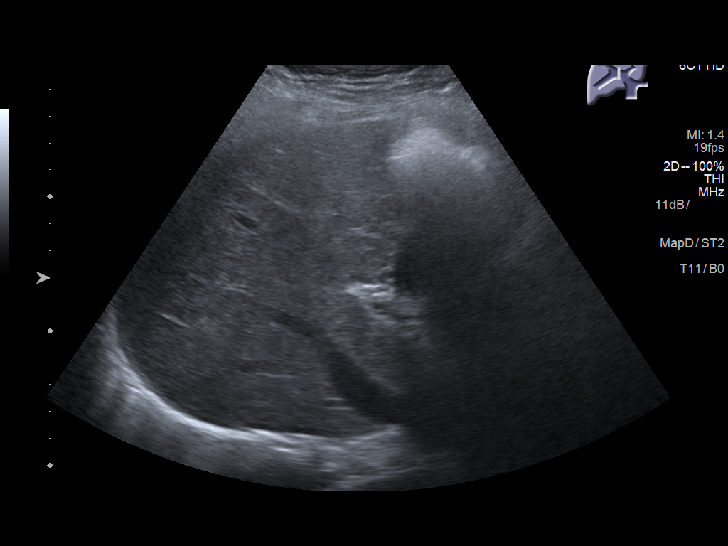
[im 33/44]
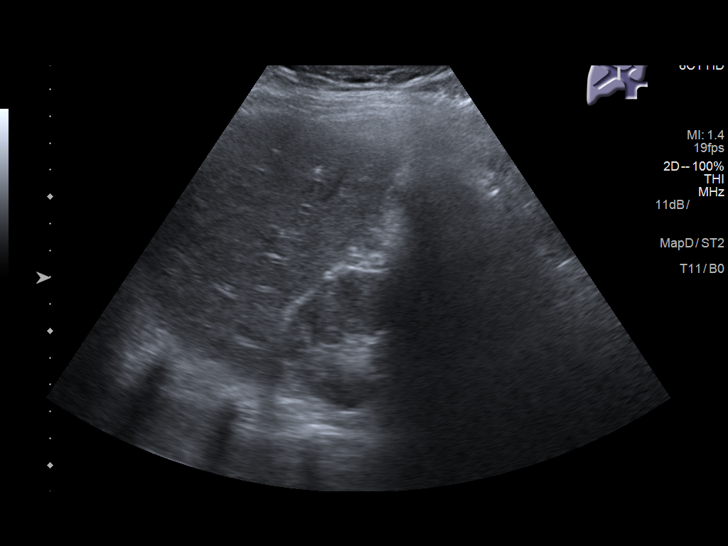
[im 36/44]
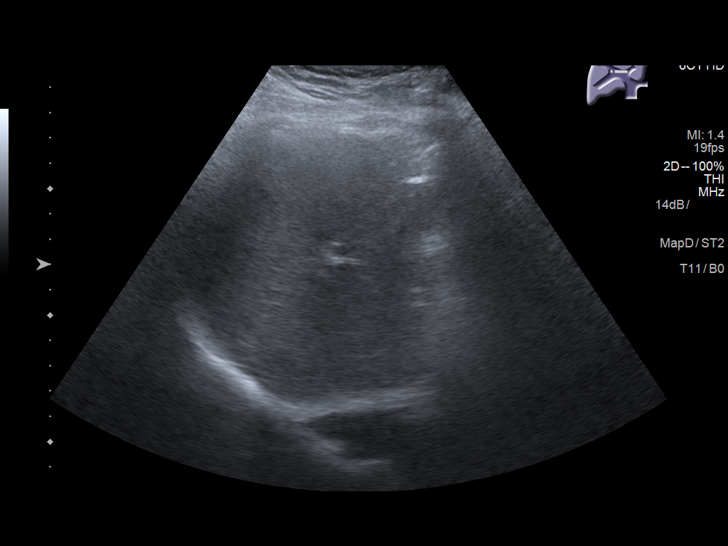
[im 40/44]
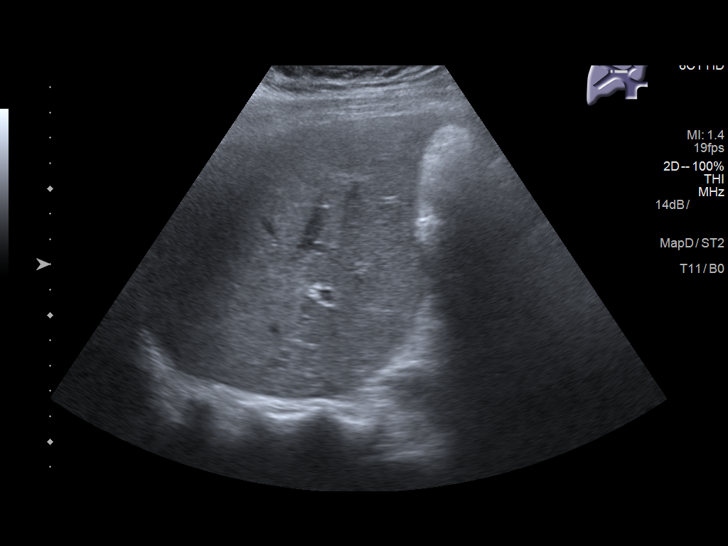
[im 44/44]
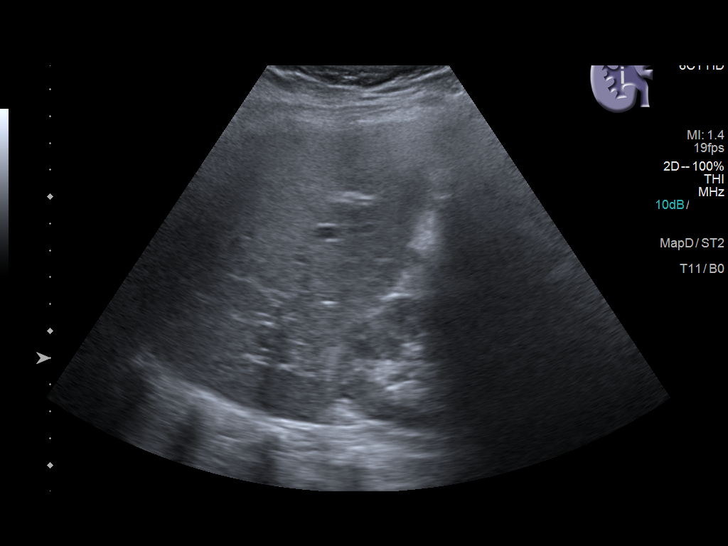

[14 of 25 positions shown; findings below may reference images not displayed]

FINDINGS: Gallbladder:

No gallstones or wall thickening visualized. No sonographic Murphy
sign noted by sonographer.

Common bile duct:

Diameter: Normal caliber of 3 mm.

Liver:

The liver demonstrates coarse echotexture and mildly increased
echogenicity, likely reflecting diffuse steatosis. No overt
cirrhotic contour abnormalities or focal lesions are identified.
There is no evidence of intrahepatic biliary ductal dilatation. The
portal vein is open.
IMPRESSION: Normal appearance of gallbladder and bile ducts. The liver
demonstrates evidence of probable mild steatosis.

## 2019-05-08 ENCOUNTER — Emergency Department (HOSPITAL_COMMUNITY)
Admission: EM | Admit: 2019-05-08 | Discharge: 2019-05-09 | Disposition: A | Discharge status: Home / Self Care | Payer: Medicaid Other | Attending: Emergency Medicine | Admitting: Emergency Medicine

## 2019-05-08 ENCOUNTER — Encounter (HOSPITAL_COMMUNITY): Payer: Self-pay

## 2019-05-08 ENCOUNTER — Ambulatory Visit (HOSPITAL_COMMUNITY)
Admission: EM | Admit: 2019-05-08 | Discharge: 2019-05-08 | Disposition: A | Discharge status: Home / Self Care | Payer: Medicaid Other | Attending: Family Medicine | Admitting: Family Medicine

## 2019-05-08 ENCOUNTER — Other Ambulatory Visit: Payer: Self-pay

## 2019-05-08 DIAGNOSIS — E119 Type 2 diabetes mellitus without complications: Secondary | ICD-10-CM | POA: Diagnosis not present

## 2019-05-08 DIAGNOSIS — Y999 Unspecified external cause status: Secondary | ICD-10-CM | POA: Diagnosis not present

## 2019-05-08 DIAGNOSIS — F319 Bipolar disorder, unspecified: Secondary | ICD-10-CM | POA: Insufficient documentation

## 2019-05-08 DIAGNOSIS — Y929 Unspecified place or not applicable: Secondary | ICD-10-CM | POA: Insufficient documentation

## 2019-05-08 DIAGNOSIS — Y939 Activity, unspecified: Secondary | ICD-10-CM | POA: Diagnosis not present

## 2019-05-08 DIAGNOSIS — S51812A Laceration without foreign body of left forearm, initial encounter: Secondary | ICD-10-CM | POA: Diagnosis present

## 2019-05-08 DIAGNOSIS — Z23 Encounter for immunization: Secondary | ICD-10-CM | POA: Diagnosis not present

## 2019-05-08 DIAGNOSIS — R600 Localized edema: Secondary | ICD-10-CM

## 2019-05-08 DIAGNOSIS — Z9189 Other specified personal risk factors, not elsewhere classified: Secondary | ICD-10-CM

## 2019-05-08 DIAGNOSIS — T189XXA Foreign body of alimentary tract, part unspecified, initial encounter: Secondary | ICD-10-CM

## 2019-05-08 DIAGNOSIS — R4589 Other symptoms and signs involving emotional state: Secondary | ICD-10-CM

## 2019-05-08 DIAGNOSIS — X780XXA Intentional self-harm by sharp glass, initial encounter: Secondary | ICD-10-CM | POA: Diagnosis not present

## 2019-05-08 DIAGNOSIS — F339 Major depressive disorder, recurrent, unspecified: Secondary | ICD-10-CM | POA: Insufficient documentation

## 2019-05-08 DIAGNOSIS — I10 Essential (primary) hypertension: Secondary | ICD-10-CM | POA: Diagnosis not present

## 2019-05-08 DIAGNOSIS — Z79899 Other long term (current) drug therapy: Secondary | ICD-10-CM | POA: Diagnosis not present

## 2019-05-08 DIAGNOSIS — R4588 Nonsuicidal self-harm: Secondary | ICD-10-CM

## 2019-05-08 DIAGNOSIS — S41112A Laceration without foreign body of left upper arm, initial encounter: Secondary | ICD-10-CM

## 2019-05-08 MED ORDER — ACETAMINOPHEN 325 MG PO TABS
ORAL_TABLET | ORAL | Status: AC
  Filled 2019-05-08: qty 3

## 2019-05-08 MED ORDER — ACETAMINOPHEN 325 MG PO TABS
975.0000 mg | ORAL_TABLET | Freq: Once | ORAL | Status: AC
  Administered 2019-05-08: 19:00:00 975 mg via ORAL

## 2019-05-08 MED ORDER — TETANUS-DIPHTH-ACELL PERTUSSIS 5-2.5-18.5 LF-MCG/0.5 IM SUSP
0.5000 mL | Freq: Once | INTRAMUSCULAR | Status: AC
  Administered 2019-05-08: 19:00:00 0.5 mL via INTRAMUSCULAR

## 2019-05-08 MED ORDER — TETANUS-DIPHTH-ACELL PERTUSSIS 5-2.5-18.5 LF-MCG/0.5 IM SUSP
INTRAMUSCULAR | Status: AC
  Filled 2019-05-08: qty 0.5

## 2019-05-08 NOTE — ED Triage Notes (Signed)
Pt presents to UC stating a lamp had broken and she was trying to pick up the broken bulb pieces but cut herself. Pt's left forearm is lacerated. Laceration is approximately 3 inches.

## 2019-05-08 NOTE — Discharge Instructions (Addendum)
GO TO THE ER NOW    Loosely cover bandage to keep protected.  Keep the strips in place as able for the next 7-10 days.  Ice, elevation, tylenol or ibuprofen as needed for pain.  Please follow up with your psychiatrist as discussed.  Please return for any signs of infection- redness, swelling, increased pain, drainage.
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
## 2019-05-08 NOTE — ED Triage Notes (Signed)
Pt arrived with a approximately 3 in laceration to her left forearm. States that it was from a broken lamp around 3pm today. Pt was sent here from UC, states they updated her tetanus booster there today. Stero-strips in place from UC, bleeding controlled.

## 2019-05-09 ENCOUNTER — Emergency Department (HOSPITAL_COMMUNITY): Payer: Medicaid Other

## 2019-05-09 ENCOUNTER — Encounter (HOSPITAL_COMMUNITY): Payer: Self-pay | Admitting: Registered Nurse

## 2019-05-09 LAB — CBC WITH DIFFERENTIAL/PLATELET
Abs Immature Granulocytes: 0.06 10*3/uL (ref 0.00–0.07)
Basophils Absolute: 0.1 10*3/uL (ref 0.0–0.1)
Basophils Relative: 1 %
Eosinophils Absolute: 0.1 10*3/uL (ref 0.0–0.5)
Eosinophils Relative: 1 %
HCT: 40.7 % (ref 36.0–46.0)
Hemoglobin: 12.3 g/dL (ref 12.0–15.0)
Immature Granulocytes: 1 %
Lymphocytes Relative: 29 %
Lymphs Abs: 2.3 10*3/uL (ref 0.7–4.0)
MCH: 26 pg (ref 26.0–34.0)
MCHC: 30.2 g/dL (ref 30.0–36.0)
MCV: 86 fL (ref 80.0–100.0)
Monocytes Absolute: 0.5 10*3/uL (ref 0.1–1.0)
Monocytes Relative: 6 %
Neutro Abs: 4.9 10*3/uL (ref 1.7–7.7)
Neutrophils Relative %: 62 %
Platelets: 285 10*3/uL (ref 150–400)
RBC: 4.73 MIL/uL (ref 3.87–5.11)
RDW: 15 % (ref 11.5–15.5)
WBC: 7.9 10*3/uL (ref 4.0–10.5)
nRBC: 0 % (ref 0.0–0.2)

## 2019-05-09 LAB — BASIC METABOLIC PANEL
Anion gap: 10 (ref 5–15)
BUN: 17 mg/dL (ref 6–20)
CO2: 22 mmol/L (ref 22–32)
Calcium: 9 mg/dL (ref 8.9–10.3)
Chloride: 106 mmol/L (ref 98–111)
Creatinine, Ser: 1.02 mg/dL — ABNORMAL HIGH (ref 0.44–1.00)
GFR calc Af Amer: >60 mL/min (ref >60)
GFR calc non Af Amer: >60 mL/min (ref >60)
Glucose, Bld: 121 mg/dL — ABNORMAL HIGH (ref 70–99)
Potassium: 3.6 mmol/L (ref 3.5–5.1)
Sodium: 138 mmol/L (ref 135–145)

## 2019-05-09 LAB — RAPID URINE DRUG SCREEN, HOSP PERFORMED
Amphetamines: NOT DETECTED
Barbiturates: NOT DETECTED
Benzodiazepines: NOT DETECTED
Cocaine: NOT DETECTED
Opiates: NOT DETECTED
Tetrahydrocannabinol: NOT DETECTED

## 2019-05-09 LAB — ETHANOL: Alcohol, Ethyl (B): <10 mg/dL (ref <10)

## 2019-05-09 LAB — HCG, QUANTITATIVE, PREGNANCY: hCG, Beta Chain, Quant, S: 1 m[IU]/mL (ref <5)

## 2019-05-09 MED ORDER — ACETAMINOPHEN 325 MG PO TABS: 650.0000 mg | ORAL_TABLET | ORAL | Status: DC | PRN

## 2019-05-09 MED ORDER — FUROSEMIDE 20 MG PO TABS: 20.0000 mg | ORAL_TABLET | Freq: Every day | ORAL | 4 refills | Status: DC

## 2019-05-09 MED ORDER — POTASSIUM CHLORIDE ER 10 MEQ PO TBCR: 10.0000 meq | EXTENDED_RELEASE_TABLET | Freq: Every day | ORAL | 0 refills | Status: DC

## 2019-05-09 NOTE — ED Notes (Signed)
Pt aware that urine sample is needed.  

## 2019-05-09 NOTE — ED Notes (Signed)
TTS at bedside. 

## 2019-05-09 NOTE — BHH Counselor (Addendum)
Megan Romp, NP, patient does not meet inpatient criteria. Patient will follow up with psychiatrist at scheduled appt today at [place], Dr. Milta Deiters at the [place]. RN informed of disposition.  Discussed disposition with Agricultural consultant, Megan Manning. She states that patient's caregiver,Megan Manning, reported to ED staff that patient attempted to eat light bulb in the group home. This information was not reported to TTS. This counselor will discuss patient's disposition with provider.  This counselor spoke with patient's caregiver, Megan Manning [phone]. She verified that patient did attempt to eat the glass from the light bulb while she was at urgent care. She states that she never ingested it and this is something she does to try and get into the hospital. She does not have concerns and is comfortable with patient being discharged to her care. Will discuss this information with provider.

## 2019-05-09 NOTE — BH Assessment (Addendum)
[facility] Assessment Progress Note  Per Elease Etienne, MD, this voluntary pt does not require psychiatric hospitalization at this time.  Pt is to be discharged from [facility].  No behavioral health referrals are indicated for this pt at this time.  She is to return to her group home.  Arbie Cookey, [place] reports that she has contacted the group home staff, and they are to present at [facility] in about five minutes to pick pt up.  Pt's nurse, Nena Jordan, has been notified.  Jalene Mullet, MA Triage Specialist [phone]   Addendum:  Pt's legal guardian is Danae Chen Fears with [employer] [phone]).  At 10:29 this Probation officer called her and notified her of pt's disposition.  I noted that we do not have a copy of the Letter of Guardianship, and asked her to send it to [name] for pt's record.  Jalene Mullet, [place] Coordinator [phone]

## 2019-05-09 NOTE — ED Notes (Signed)
It was reported this am at change of shift, pt tried to also ingest parts of the broken light bulb. This information was passed on to [place] by Probation officer to f/u with Caregiver in order to verify if this event happened.

## 2019-05-09 NOTE — BHH Counselor (Signed)
TTS spoke with patient's caregiver, Kenney Houseman. She is on her way to pick patient up.

## 2019-05-09 NOTE — Progress Notes (Signed)
CSW spoke with Gari Crown, caregiver at the patient's group home who states she is going to arrive at [facility] ED in approximately five minutes. CSW notified Tonette Bihari of information. Kenney Houseman is also not the patient's legal guardian, it is unclear if the patient has a legal guardian or not.  Madilyn Fireman, MSW, LCSW-A Transitions of Care  Clinical Social Worker  [facility] Emergency Departments  Medical ICU [phone]

## 2019-05-09 NOTE — BHH Counselor (Addendum)
*This counselor reviewed patient's chart as disposition was unclear. Patient was assessed last night by TTS and psychiatrically cleared. The note was not signed and therefore not visible in chart review. Here is the note from 10/13 at 0600. Patient is psych cleared*.  Patient Name: Megan Manning MRN: [id number] Referring Physician: Dr. Ross Marcus Location of Patient: [place] Location of Provider: Behavioral Health TTS Department  Megan Manning is an 38 y.o. female with a history of adjustment disorder, borderline personality disorder, intellectual disability, diabetes, hypertension presenting with concern of bodily harm. Patient currently resides with legal guardian Arville Go, [phone]. Patient was seen at urgent care today after cutting herself with a piece of glass. Patient initially told urgent care that a lamp broke and cut her on the arm, patient later admits that she cut herself with a broken light bulb. While at urgent care patient has another piece of glass in her pocket, cut herself, put in her mouth and may have swallowed the glass. Patient denied SI, HI and psychosis. Patient reported cutting herself due to her stomach and legs bothering her, along with her breast leaking milk. Patient continues to report that she cut herself so she could see the doctor for her pain in her stomach, legs and leaking breast. Patient denied prior suicide attempts. Patient was cooperative during assessment and repeatedly asked for help for the pain in her legs.   Collateral Contact: Arville Go, legal guardian, [phone]. Archie Patten stated this is the first time patient has cut herself since being her care 10/2018. However according to patient chart, per Archie Patten, patient has a history of cutting herself when she gets upset. Alinda Money reported patient is not suicidal. Archie Patten reported she brought patient to ED due to Urgent Care staff wanting her to have a mental health assessment after she presenting with the cut  on her arm and initially telling them she cut self accidentally to cutting herself on purpose. Archie Patten reported that after leaving Urgent Care patient was calm and took her medication with no incidents. Archie Patten reported patient has a scheduled psychiatrist appointment with Dr Jennette Kettle at the [facility] today at [facility]. Archie Patten reported this was the only self harming incident and denied knowledge of patient being SI or Hi or experiencing psychosis. Archie Patten stated she feels that patient is not a threat to herself or anyone else and feels comfortable if patient is discharged back into her care.   Diagnosis: Major depressive disorder  Past Medical History:      Past Medical History:  Diagnosis Date  . Adjustment disorder   . Borderline personality disorder (HCC)   . Diabetes mellitus without complication (HCC)   . Hypertension   . Hyperthyroidism   . Intellectual disability   . Lactic acidosis   . Overdose   . Thyroid disease          Past Surgical History:  Procedure Laterality Date  . COLONOSCOPY WITH PROPOFOL N/A 12/22/2016   Procedure: COLONOSCOPY WITH PROPOFOL;  Surgeon: Wyline Mood, MD;  Location: [facility] ENDOSCOPY;  Service: Endoscopy;  Laterality: N/A;  . NO PAST SURGERIES      Family History:       Family History  Problem Relation Age of Onset  . CVA Neg Hx     Social History:  reports that she has never smoked. She has never used smokeless tobacco. She reports that she does not drink alcohol or use drugs.  Additional Social History:  Alcohol / Drug Use Pain Medications: see MAR Prescriptions:  see MAR Over the Counter: see MAR  CIWA: CIWA-Ar BP: (!) 160/94 Pulse Rate: 90 COWS:    Allergies:       Allergies  Allergen Reactions  . Chlorpromazine Other (See Comments)    Caused cornea problems    Home Medications: (Not in a hospital admission)   OB/GYN Status:  No LMP recorded. Patient has had an injection.  General Assessment Data Location of  Assessment: [facility] ED TTS Assessment: In system Is this a Tele or Face-to-Face Assessment?: Tele Assessment Is this an Initial Assessment or a Re-assessment for this encounter?: Initial Assessment Patient Accompanied by:: N/A Language Other than English: No Living Arrangements: (legal guardian) What gender do you identify as?: Female Marital status: Single Pregnancy Status: Unknown Living Arrangements: Non-relatives/Friends Can pt return to current living arrangement?: Yes Admission Status: Voluntary Is patient capable of signing voluntary admission?: Yes Referral Source: Self/Family/Friend   Crisis Care Plan Living Arrangements: Non-relatives/Friends Legal Guardian: Other:(Tonya Kimber) Name of Psychiatrist: (none) Name of Therapist: (none)  Education Status Is patient currently in school?: No Is the patient employed, unemployed or receiving disability?: Receiving disability income  Risk to self with the past 6 months Suicidal Ideation: No Has patient been a risk to self within the past 6 months prior to admission? : No Suicidal Intent: No Has patient had any suicidal intent within the past 6 months prior to admission? : No Is patient at risk for suicide?: No Suicidal Plan?: No Has patient had any suicidal plan within the past 6 months prior to admission? : No Access to Means: No What has been your use of drugs/alcohol within the last 12 months?: (none) Previous Attempts/Gestures: No How many times?: (0) Other Self Harm Risks: (none) Triggers for Past Attempts: (n/a) Intentional Self Injurious Behavior: Cutting Comment - Self Injurious Behavior: today Family Suicide History: No Recent stressful life event(s): (pain in legs) Persecutory voices/beliefs?: No Depression: Yes Depression Symptoms: Feeling angry/irritable, Fatigue, Guilt, Tearfulness, Insomnia Substance abuse history and/or treatment for substance abuse?: No Suicide prevention information given to  non-admitted patients: Not applicable  Risk to Others within the past 6 months Homicidal Ideation: No Does patient have any lifetime risk of violence toward others beyond the six months prior to admission? : No Thoughts of Harm to Others: No Current Homicidal Intent: No Current Homicidal Plan: No Access to Homicidal Means: No History of harm to others?: No Assessment of Violence: None Noted Violent Behavior Description: (none reported) Does patient have access to weapons?: No Criminal Charges Pending?: No Does patient have a court date: No Is patient on probation?: No  Psychosis Hallucinations: None noted Delusions: None noted  Mental Status Report Appearance/Hygiene: Unremarkable Eye Contact: Good Motor Activity: Freedom of movement Speech: Logical/coherent Level of Consciousness: Alert Mood: Depressed, Anxious Affect: Anxious, Depressed Anxiety Level: Moderate Thought Processes: Coherent, Relevant Judgement: Impaired Orientation: Person, Place, Time, Situation, Appropriate for developmental age Obsessive Compulsive Thoughts/Behaviors: None  Cognitive Functioning Concentration: Fair Memory: Recent Intact Is patient IDD: Yes Level of Function: (high) Is IQ score available?: No Insight: Poor Impulse Control: Poor Appetite: Poor Have you had any weight changes? : No Change Sleep: Decreased Total Hours of Sleep: ("not a lot") Vegetative Symptoms: None  ADLScreening [facility] Assessment Services) Patient's cognitive ability adequate to safely complete daily activities?: Yes Patient able to express need for assistance with ADLs?: Yes Independently performs ADLs?: Yes (appropriate for developmental age)  Prior Inpatient Therapy Prior Inpatient Therapy: No  Prior Outpatient Therapy Prior Outpatient Therapy: No Does patient  have an ACCT team?: No Does patient have Intensive In-House Services?  : No Does patient have [facility] services? : No Does patient have P4CC  services?: No  ADL Screening (condition at time of admission) Patient's cognitive ability adequate to safely complete daily activities?: Yes Patient able to express need for assistance with ADLs?: Yes Independently performs ADLs?: Yes (appropriate for developmental age)  Regulatory affairs officer (For Healthcare) Does Patient Have a Medical Advance Directive?: No Would patient like information on creating a medical advance directive?: No - Patient declined Disposition:  Disposition Initial Assessment Completed for this Encounter: Yes  Lindon Romp, NP, patient does not meet inpatient criteria. Patient will follow up with psychiatrist at scheduled appt today at [place], Dr. Milta Deiters at the [place]. RN informed of disposition.

## 2019-05-09 NOTE — ED Provider Notes (Signed)
Pt with edema x several weeks Legs are symmetrically edematous without infection Labs reviewed and renal fx and xray without signs of fluid overload Has had this in the past Will place on short course of diuretic   Lacretia Leigh, MD 05/09/19 [phone]

## 2019-05-09 NOTE — ED Notes (Signed)
Pt is complaining of swollen ankles and stomach pain.  Psych provider agreed to call EDP and report this and tell them that she is psyche cleared.

## 2019-05-09 NOTE — ED Notes (Signed)
Pt ambulated to the bathroom without assistance. Gait steady  

## 2019-05-09 NOTE — ED Notes (Signed)
Pt came to unit calm and cooperative.  15 minute checks and video monitoring in place.

## 2019-05-09 NOTE — Discharge Summary (Signed)
  Megan Manning, 38 y.o., female patient seen via telepsych by this provider; chart reviewed and consulted with Dr. Dwyane Dee on 05/09/19.  Patient psychiatrically cleared last night related to not meeting criteria for inpatient psychiatric treatment.  Spoke with patient via tele psych this morning On evaluation Megan Manning reports she is doing well and continues to deny suicidal/self-harm/homicidal ideation, psychosis, and paranoia.  Patient states that she did not sleep well related to pain and swelling in her legs and stomach; "They getting big really big (pointing to her legs) and they hurt and my stomach too it's getting big, big"    During evaluation Megan Manning is sitting up in chair; she is alert/oriented x 4; calm/cooperative; and mood congruent with affect.  Patient is speaking in a clear tone at moderate volume, and normal pace; with good eye contact.  Her thought process is coherent and relevant; There is no indication that she is currently responding to internal/external stimuli or experiencing delusional thought content.  Patient denies suicidal/self-harm/homicidal ideation, psychosis, and paranoia.  Patient has remained calm throughout assessment and has answered questions appropriately.   Recommendation: Psychiatrically cleared.  TTS or SW will contact the group home that patient can be picked up  Disposition:  Patient psychiatrically cleared  No evidence of imminent risk to self or others at present.   Patient does not meet criteria for psychiatric inpatient admission. Supportive therapy provided about ongoing stressors. Discussed crisis plan, support from social network, calling 911, coming to the Emergency Department, and calling Suicide Hotline.   Spoke with Megan Sicilian, RN who will inform EDP that patient has been psychiatrically cleared.     Earleen Newport, NP

## 2019-05-09 NOTE — ED Notes (Signed)
Report given to [facility] as pt is to go to TCU for TTS consult

## 2019-05-09 NOTE — ED Notes (Addendum)
RN attempted to stick pt x2 without success. Phleblotomy called

## 2019-05-09 NOTE — BH Assessment (Signed)
Tele Assessment Note   Patient Name: Megan Manning MRN: [id number] Referring Physician: Dr. Ross Marcusourtney Horton [facility] Location of Provider: Behavioral Health TTS Department  Megan Manning is an 38 y.o. female with a history of adjustment disorder, borderline personality disorder, intellectual disability, diabetes, hypertension presenting with concern of bodily harm. Patient currently resides with legal guardian Arville Goonya Kimber, [phone]. Patient was seen at urgent care today after cutting herself with a piece of glass. Patient initially told urgent care that a lamp broke and cut her on the arm, patient later admits that she cut herself with a broken light bulb. While at urgent care patient has another piece of glass in her pocket, cut herself, put in her mouth and may have swallowed the glass. Patient denied SI, HI and psychosis. Patient reported cutting herself due to her stomach and legs bothering her, along with her breast leaking milk. Patient continues to report that she cut herself so she could see the doctor for her pain in her stomach, legs and leaking breast. Patient denied prior suicide attempts. Patient was cooperative during assessment and repeatedly asked for help for the pain in her legs.   Collateral Contact: Arville Goonya Kimber, legal guardian, [phone]. Archie Pattenonya stated this is the first time patient has cut herself since being her care 10/2018. However according to patient chart, per Archie Pattenonya, patient has a history of cutting herself when she gets upset. Alinda Moneyony reported patient is not suicidal. Archie Pattenonya reported she brought patient to ED due to Urgent Care staff wanting her to have a mental health assessment after she presenting with the cut on her arm and initially telling them she cut self accidentally to cutting herself on purpose. Archie Pattenonya reported that after leaving Urgent Care patient was calm and took her medication with no incidents. Archie Pattenonya reported patient has a scheduled  psychiatrist appointment with Dr Jenn[facility]al [facility] at [facility]. Archie Pattenonya reported this was the only self harming incident and denied knowledge of patient being SI or Hi or experiencing psychosis. Archie Pattenonya stated she feels that patient is not a threat to herself or anyone else and feels comfortable if patient is discharged back into her care.   Diagnosis: Major depressive disorder  Past Medical History:  Past Medical History:  Diagnosis Date  . Adjustment disorder   . Borderline personality disorder (HCC)   . Diabetes mellitus without complication (HCC)   . Hypertension   . Hyperthyroidism   . Intellectual disability   . Lactic acidosis   . Overdose   . Thyroid disease     Past Surgical History:  Procedure Laterality Date  . COLONOSCOPY WITH PROPOFOL N/A 12/22/2016   Procedure: COLONOSCOPY WITH PROPOFOL;  Surgeon: Wyline MoodAnna, Kiran, MD;  [facility];  Service: Endoscopy;  Laterality: N/A;  . NO PAST SURGERIES      Family History:  Family History  Problem Relation Age of Onset  . CVA Neg Hx     Social History:  reports that she has never smoked. She has never used smokeless tobacco. She reports that she does not drink alcohol or use drugs.  Additional Social History:  Alcohol / Drug Use Pain Medications: see MAR Prescriptions: see MAR Over the Counter: see MAR  CIWA: CIWA-Ar BP: (!) 160/94 Pulse Rate: 90 COWS:    Allergies:  Allergies  Allergen Reactions  . Chlorpromazine Other (See Comments)    Caused cornea problems    Home Medications: (Not in a hospital admission)   OB/GYN Status:  No LMP recorded. Patient has had an injection.  General Assessment Data Location of Assessment: [facility] ED TTS Assessment: In system Is this a Tele or Face-to-Face Assessment?: Tele Assessment Is this an Initial Assessment or a Re-assessment for this encounter?: Initial Assessment Patient Accompanied by:: N/A Language Other than English: No Living Arrangements: (legal  guardian) What gender do you identify as?: Female Marital status: Single Pregnancy Status: Unknown Living Arrangements: Non-relatives/Friends Can pt return to current living arrangement?: Yes Admission Status: Voluntary Is patient capable of signing voluntary admission?: Yes Referral Source: Self/Family/Friend     Crisis Care Plan Living Arrangements: Non-relatives/Friends Legal Guardian: Other:(Tonya Kimber) Name of Psychiatrist: (none) Name of Therapist: (none)  Education Status Is patient currently in school?: No Is the patient employed, unemployed or receiving disability?: Receiving disability income  Risk to self with the past 6 months Suicidal Ideation: No Has patient been a risk to self within the past 6 months prior to admission? : No Suicidal Intent: No Has patient had any suicidal intent within the past 6 months prior to admission? : No Is patient at risk for suicide?: No Suicidal Plan?: No Has patient had any suicidal plan within the past 6 months prior to admission? : No Access to Means: No What has been your use of drugs/alcohol within the last 12 months?: (none) Previous Attempts/Gestures: No How many times?: (0) Other Self Harm Risks: (none) Triggers for Past Attempts: (n/a) Intentional Self Injurious Behavior: Cutting Comment - Self Injurious Behavior: today Family Suicide History: No Recent stressful life event(s): (pain in legs) Persecutory voices/beliefs?: No Depression: Yes Depression Symptoms: Feeling angry/irritable, Fatigue, Guilt, Tearfulness, Insomnia Substance abuse history and/or treatment for substance abuse?: No Suicide prevention information given to non-admitted patients: Not applicable  Risk to Others within the past 6 months Homicidal Ideation: No Does patient have any lifetime risk of violence toward others beyond the six months prior to admission? : No Thoughts of Harm to Others: No Current Homicidal Intent: No Current Homicidal  Plan: No Access to Homicidal Means: No History of harm to others?: No Assessment of Violence: None Noted Violent Behavior Description: (none reported) Does patient have access to weapons?: No Criminal Charges Pending?: No Does patient have a court date: No Is patient on probation?: No  Psychosis Hallucinations: None noted Delusions: None noted  Mental Status Report Appearance/Hygiene: Unremarkable Eye Contact: Good Motor Activity: Freedom of movement Speech: Logical/coherent Level of Consciousness: Alert Mood: Depressed, Anxious Affect: Anxious, Depressed Anxiety Level: Moderate Thought Processes: Coherent, Relevant Judgement: Impaired Orientation: Person, Place, Time, Situation, Appropriate for developmental age Obsessive Compulsive Thoughts/Behaviors: None  Cognitive Functioning Concentration: Fair Memory: Recent Intact Is patient IDD: Yes Level of Function: (high) Is IQ score available?: No Insight: Poor Impulse Control: Poor Appetite: Poor Have you had any weight changes? : No Change Sleep: Decreased Total Hours of Sleep: ("not a lot") Vegetative Symptoms: None  ADLScreening [facility] Assessment [facility]) Patient's cognitive ability adequate to safely complete daily activities?: Yes Patient able to express need for assistance with ADLs?: Yes Independently performs ADLs?: Yes (appropriate for developmental age)  Prior Inpatient Therapy Prior Inpatient Therapy: No  Prior Outpatient Therapy Prior Outpatient Therapy: No Does patient have an ACCT team?: No Does patient have Intensive In-House Services?  : No Does patient have [facility] services? : No Does patient have P4CC services?: No  ADL Screening (condition at time of admission) Patient's cognitive ability adequate to safely complete daily activities?: Yes Patient able to express need for assistance with ADLs?: Yes Independently  performs ADLs?: Yes (appropriate for developmental age)  Regulatory affairs officer (For  Healthcare) Does Patient Have a Medical Advance Directive?: No Would patient like information on creating a medical advance directive?: No - Patient declined   Disposition:  Disposition Initial Assessment Completed for this Encounter: Yes  Lindon Romp, NP, patient does not meet inpatient criteria. Patient will follow up with psychiatrist at scheduled appt today at [place], Dr. Milta Deiters at the [facility]. RN informed of disposition.   This service was provided via telemedicine using a 2-way, interactive audio and video technology.  Names of all persons participating in this telemedicine service and their role in this encounter. Name: Shevelle Smither Role: Patient  Name: Kirtland Bouchard Role: TTS Clinician  Name:  Role:   Name:  Role:     Venora Maples 05/09/2019 5:57 AM

## 2019-05-09 NOTE — ED Notes (Signed)
Kenney Houseman (Caregiver) [phone] would like to be called with updates

## 2019-06-27 ENCOUNTER — Ambulatory Visit (HOSPITAL_COMMUNITY)
Admission: EM | Admit: 2019-06-27 | Discharge: 2019-06-27 | Disposition: A | Discharge status: Home / Self Care | Payer: Medicaid Other | Attending: Family Medicine | Admitting: Family Medicine

## 2019-06-27 ENCOUNTER — Encounter (HOSPITAL_COMMUNITY): Payer: Self-pay

## 2019-06-27 ENCOUNTER — Other Ambulatory Visit: Payer: Self-pay

## 2019-06-27 DIAGNOSIS — R5383 Other fatigue: Secondary | ICD-10-CM | POA: Insufficient documentation

## 2019-06-27 DIAGNOSIS — R109 Unspecified abdominal pain: Secondary | ICD-10-CM | POA: Insufficient documentation

## 2019-06-27 DIAGNOSIS — E079 Disorder of thyroid, unspecified: Secondary | ICD-10-CM | POA: Diagnosis not present

## 2019-06-27 DIAGNOSIS — R112 Nausea with vomiting, unspecified: Secondary | ICD-10-CM | POA: Insufficient documentation

## 2019-06-27 DIAGNOSIS — F79 Unspecified intellectual disabilities: Secondary | ICD-10-CM | POA: Insufficient documentation

## 2019-06-27 DIAGNOSIS — Z20828 Contact with and (suspected) exposure to other viral communicable diseases: Secondary | ICD-10-CM | POA: Insufficient documentation

## 2019-06-27 DIAGNOSIS — F603 Borderline personality disorder: Secondary | ICD-10-CM | POA: Insufficient documentation

## 2019-06-27 DIAGNOSIS — E119 Type 2 diabetes mellitus without complications: Secondary | ICD-10-CM | POA: Insufficient documentation

## 2019-06-27 DIAGNOSIS — I10 Essential (primary) hypertension: Secondary | ICD-10-CM | POA: Insufficient documentation

## 2019-06-27 DIAGNOSIS — R059 Cough, unspecified: Secondary | ICD-10-CM

## 2019-06-27 DIAGNOSIS — E669 Obesity, unspecified: Secondary | ICD-10-CM | POA: Insufficient documentation

## 2019-06-27 DIAGNOSIS — R05 Cough: Secondary | ICD-10-CM

## 2019-06-27 HISTORY — DX: Obesity, unspecified: E66.9

## 2019-06-27 LAB — POC SARS CORONAVIRUS 2 AG: SARS Coronavirus 2 Ag: NEGATIVE

## 2019-06-27 LAB — POC SARS CORONAVIRUS 2 AG -  ED: SARS Coronavirus 2 Ag: NEGATIVE

## 2019-06-27 MED ORDER — ONDANSETRON 4 MG PO TBDP: 4.0000 mg | ORAL_TABLET | Freq: Three times a day (TID) | ORAL | 0 refills | Status: DC | PRN

## 2019-06-27 MED ORDER — HYDROCODONE-HOMATROPINE 5-1.5 MG/5ML PO SYRP: 5.0000 mL | ORAL_SOLUTION | Freq: Four times a day (QID) | ORAL | 0 refills | Status: DC | PRN

## 2019-06-27 NOTE — Discharge Instructions (Addendum)
If your Covid-19 test is positive, you will receive a phone call from [facility] regarding your results. Negative test results are not called. Both positive and negative results area always visible on MyChart. If you do not have a MyChart account, sign up instructions are in your discharge papers.  

## 2019-06-27 NOTE — ED Triage Notes (Signed)
Patient presents to Urgent Care with complaints of cough and emesis since this morning. Patient reports "I just don't feel good".

## 2019-06-28 NOTE — ED Provider Notes (Signed)
[facility]   [id number] 06/27/19 Arrival Time: 4098  ASSESSMENT & PLAN:  1. Intractable vomiting with nausea, unspecified vomiting type   2. Cough     Benign abdominal exam. Less than 12 hours of symptoms. Questions etiology. Discussed possible early GI illness vs COVID. Rapid COVID test negative this evening; reflex testing sent. To self-quarantine until results are available and until she is feeling better.  To use as needed: Meds ordered this encounter  Medications  . ondansetron (ZOFRAN-ODT) 4 MG disintegrating tablet    Sig: Take 1 tablet (4 mg total) by mouth every 8 (eight) hours as needed for nausea or vomiting.    Dispense:  15 tablet    Refill:  0  . HYDROcodone-homatropine (HYCODAN) 5-1.5 MG/5ML syrup    Sig: Take 5 mLs by mouth every 6 (six) hours as needed for cough.    Dispense:  90 mL    Refill:  0      Discharge Instructions     If your Covid-19 test is positive, you will receive a phone call from [facility] regarding your results. Negative test results are not called. Both positive and negative results area always visible on MyChart. If you do not have a MyChart account, sign up instructions are in your discharge papers.        Will do her best to ensure adequate fluid intake in order to avoid dehydration. Will proceed to the Emergency Department for evaluation if unable to tolerate PO fluids regularly.  Otherwise she will f/u with her PCP or here if not showing improvement over the next 48-72 hours.  Reviewed expectations re: course of current medical issues. Questions answered. Outlined signs and symptoms indicating need for more acute intervention. Patient verbalized understanding. After Visit Summary given.   SUBJECTIVE: History from: patient.  Megan Manning is a 38 y.o. female who presents with complaint of non-bilious, non-bloody intermittent emesis without non-bloody diarrhea. Onset less than 12 hours ago after waking.  Abdominal discomfort: mild and cramping. Symptoms are unchanged since beginning. Aggravating factors: eating. Alleviating factors: none. Associated symptoms: mild fatigue; slight dry sporadic cough. She denies arthralgias, belching, constipation, headache, myalgias and sweats. Appetite: decreased. PO intake: decreased. Ambulatory without assistance. Urinary symptoms: none reported. Sick contacts: none. Recent travel or camping: none. OTC treatment: none.  No LMP recorded. Patient has had an injection.  Past Surgical History:  Procedure Laterality Date  . COLONOSCOPY WITH PROPOFOL N/A 12/22/2016   Procedure: COLONOSCOPY WITH PROPOFOL;  Surgeon: [name], MD;  Location: [facility] ENDOSCOPY;  Service: Endoscopy;  Laterality: N/A;  . NO PAST SURGERIES      ROS: As per HPI. All other systems negative.   OBJECTIVE:  Vitals:   06/27/19 1700  BP: (!) 159/101  Pulse: 93  Resp: (!) 24  Temp: 100.1 F (37.8 C)  TempSrc: Oral  SpO2: 97%    Recheck RR: 14  General appearance: alert; no distress Oropharynx: moist Lungs: clear to auscultation bilaterally; unlabored Heart: regular rate and rhythm Abdomen: soft; non-distended; no significant abdominal tenderness; reports "cramping" feeling; bowel sounds present; no masses or organomegaly; no guarding or rebound tenderness Back: no CVA tenderness Extremities: no edema; symmetrical with no gross deformities Skin: warm; dry Neurologic: normal gait Psychological: alert and cooperative; normal mood and affect  Labs: Results for orders placed or performed during the hospital encounter of 06/27/19  POC SARS Coronavirus 2 Ag-ED - Nasal Swab (BD Veritor Kit)  Result Value Ref Range   SARS Coronavirus  2 Ag NEGATIVE NEGATIVE  POC SARS Coronavirus 2 Ag  Result Value Ref Range   SARS Coronavirus 2 Ag NEGATIVE NEGATIVE   Labs Reviewed  NOVEL CORONAVIRUS, NAA (HOSP ORDER, SEND-OUT TO REF LAB; TAT 18-24 HRS)  POC SARS CORONAVIRUS 2 AG -  ED  POC  SARS CORONAVIRUS 2 AG    Allergies  Allergen Reactions  . Chlorpromazine Other (See Comments)    Caused cornea problems                                               Past Medical History:  Diagnosis Date  . Adjustment disorder   . Borderline personality disorder ([place])   . Diabetes mellitus without complication ([place])   . Hypertension   . Hyperthyroidism   . Intellectual disability   . Lactic acidosis   . Obesity   . Overdose   . Thyroid disease    Social History   Socioeconomic History  . Marital status: Single    Spouse name: Not on file  . Number of children: Not on file  . Years of education: Not on file  . Highest education level: Not on file  Occupational History  . Not on file  Social Needs  . Financial resource strain: Not on file  . Food insecurity    Worry: Not on file    Inability: Not on file  . Transportation needs    Medical: Not on file    Non-medical: Not on file  Tobacco Use  . Smoking status: Never Smoker  . Smokeless tobacco: Never Used  Substance and Sexual Activity  . Alcohol use: No  . Drug use: No  . Sexual activity: Not on file  Lifestyle  . Physical activity    Days per week: Not on file    Minutes per session: Not on file  . Stress: Not on file  Relationships  . Social Herbalist on phone: Not on file    Gets together: Not on file    Attends religious service: Not on file    Active member of club or organization: Not on file    Attends meetings of clubs or organizations: Not on file    Relationship status: Not on file  . Intimate partner violence    Fear of current or ex partner: Not on file    Emotionally abused: Not on file    Physically abused: Not on file    Forced sexual activity: Not on file  Other Topics Concern  . Not on file  Social History Narrative  . Not on file   Family History  Problem Relation Age of Onset  . Healthy Mother   . Healthy Father   . CVA Neg Hx      Vanessa Kick, MD 06/28/19  1007

## 2019-06-29 LAB — NOVEL CORONAVIRUS, NAA (HOSP ORDER, SEND-OUT TO REF LAB; TAT 18-24 HRS): SARS-CoV-2, NAA: NOT DETECTED

## 2019-07-23 ENCOUNTER — Emergency Department (HOSPITAL_COMMUNITY)
Admission: EM | Admit: 2019-07-23 | Discharge: 2019-07-24 | Disposition: A | Discharge status: Home / Self Care | Payer: Medicaid Other | Attending: Emergency Medicine | Admitting: Emergency Medicine

## 2019-07-23 ENCOUNTER — Other Ambulatory Visit: Payer: Self-pay

## 2019-07-23 ENCOUNTER — Encounter (HOSPITAL_COMMUNITY): Payer: Self-pay

## 2019-07-23 DIAGNOSIS — X780XXA Intentional self-harm by sharp glass, initial encounter: Secondary | ICD-10-CM | POA: Diagnosis not present

## 2019-07-23 DIAGNOSIS — Y92199 Unspecified place in other specified residential institution as the place of occurrence of the external cause: Secondary | ICD-10-CM | POA: Diagnosis not present

## 2019-07-23 DIAGNOSIS — IMO0002 Reserved for concepts with insufficient information to code with codable children: Secondary | ICD-10-CM

## 2019-07-23 DIAGNOSIS — R45851 Suicidal ideations: Secondary | ICD-10-CM | POA: Insufficient documentation

## 2019-07-23 DIAGNOSIS — I10 Essential (primary) hypertension: Secondary | ICD-10-CM | POA: Diagnosis not present

## 2019-07-23 DIAGNOSIS — E119 Type 2 diabetes mellitus without complications: Secondary | ICD-10-CM | POA: Insufficient documentation

## 2019-07-23 DIAGNOSIS — F329 Major depressive disorder, single episode, unspecified: Secondary | ICD-10-CM | POA: Diagnosis not present

## 2019-07-23 DIAGNOSIS — F603 Borderline personality disorder: Secondary | ICD-10-CM | POA: Diagnosis not present

## 2019-07-23 DIAGNOSIS — Z7289 Other problems related to lifestyle: Secondary | ICD-10-CM

## 2019-07-23 DIAGNOSIS — Z79899 Other long term (current) drug therapy: Secondary | ICD-10-CM | POA: Diagnosis not present

## 2019-07-23 DIAGNOSIS — Y999 Unspecified external cause status: Secondary | ICD-10-CM | POA: Diagnosis not present

## 2019-07-23 DIAGNOSIS — Z23 Encounter for immunization: Secondary | ICD-10-CM | POA: Diagnosis not present

## 2019-07-23 DIAGNOSIS — Y939 Activity, unspecified: Secondary | ICD-10-CM | POA: Diagnosis not present

## 2019-07-23 DIAGNOSIS — R451 Restlessness and agitation: Secondary | ICD-10-CM

## 2019-07-23 DIAGNOSIS — S51812A Laceration without foreign body of left forearm, initial encounter: Secondary | ICD-10-CM | POA: Diagnosis present

## 2019-07-23 DIAGNOSIS — Z20828 Contact with and (suspected) exposure to other viral communicable diseases: Secondary | ICD-10-CM | POA: Diagnosis not present

## 2019-07-23 DIAGNOSIS — F79 Unspecified intellectual disabilities: Secondary | ICD-10-CM | POA: Diagnosis not present

## 2019-07-23 MED ORDER — METOPROLOL SUCCINATE ER 25 MG PO TB24
25.0000 mg | ORAL_TABLET | Freq: Every day | ORAL | Status: DC
  Administered 2019-07-24: 25 mg via ORAL
  Filled 2019-07-23: qty 1

## 2019-07-23 MED ORDER — POLYETHYLENE GLYCOL 3350 17 G PO PACK
17.0000 g | PACK | Freq: Every day | ORAL | Status: DC
  Filled 2019-07-23 (×2): qty 1

## 2019-07-23 MED ORDER — MAGNESIUM OXIDE 400 (241.3 MG) MG PO TABS
400.0000 mg | ORAL_TABLET | Freq: Every day | ORAL | Status: DC
  Administered 2019-07-24: 400 mg via ORAL
  Filled 2019-07-23 (×2): qty 1

## 2019-07-23 MED ORDER — LORATADINE 10 MG PO TABS
10.0000 mg | ORAL_TABLET | Freq: Every day | ORAL | Status: DC
  Administered 2019-07-24: 10 mg via ORAL
  Filled 2019-07-23: qty 1

## 2019-07-23 MED ORDER — BENZTROPINE MESYLATE 1 MG PO TABS
1.0000 mg | ORAL_TABLET | Freq: Two times a day (BID) | ORAL | Status: DC
  Administered 2019-07-24 (×2): 1 mg via ORAL
  Filled 2019-07-23 (×2): qty 1

## 2019-07-23 MED ORDER — OLANZAPINE 10 MG PO TABS
20.0000 mg | ORAL_TABLET | Freq: Every day | ORAL | Status: DC
  Administered 2019-07-24: 20 mg via ORAL
  Filled 2019-07-23: qty 2

## 2019-07-23 MED ORDER — LORAZEPAM 1 MG PO TABS
1.0000 mg | ORAL_TABLET | Freq: Once | ORAL | Status: AC
  Administered 2019-07-24: 1 mg via ORAL
  Filled 2019-07-23: qty 1

## 2019-07-23 MED ORDER — PANTOPRAZOLE SODIUM 40 MG PO TBEC
40.0000 mg | DELAYED_RELEASE_TABLET | Freq: Every day | ORAL | Status: DC
  Administered 2019-07-24: 40 mg via ORAL
  Filled 2019-07-23: qty 1

## 2019-07-23 MED ORDER — FUROSEMIDE 40 MG PO TABS
40.0000 mg | ORAL_TABLET | Freq: Every day | ORAL | Status: DC
  Administered 2019-07-24: 40 mg via ORAL
  Filled 2019-07-23 (×2): qty 1

## 2019-07-23 MED ORDER — FLUOXETINE HCL 20 MG PO CAPS
40.0000 mg | ORAL_CAPSULE | Freq: Every day | ORAL | Status: DC
  Administered 2019-07-24: 40 mg via ORAL
  Filled 2019-07-23: qty 2

## 2019-07-23 MED ORDER — DIVALPROEX SODIUM 125 MG PO CSDR
250.0000 mg | DELAYED_RELEASE_CAPSULE | Freq: Two times a day (BID) | ORAL | Status: DC
  Administered 2019-07-24: 250 mg via ORAL
  Filled 2019-07-23: qty 2

## 2019-07-23 MED ORDER — OLANZAPINE 5 MG PO TABS
5.0000 mg | ORAL_TABLET | Freq: Every morning | ORAL | Status: DC
  Administered 2019-07-24: 5 mg via ORAL
  Filled 2019-07-23: qty 1

## 2019-07-23 MED ORDER — LEVOTHYROXINE SODIUM 88 MCG PO TABS
88.0000 ug | ORAL_TABLET | Freq: Every day | ORAL | Status: DC
  Administered 2019-07-24: 88 ug via ORAL
  Filled 2019-07-23: qty 1

## 2019-07-23 MED ORDER — ONDANSETRON 4 MG PO TBDP: 4.0000 mg | ORAL_TABLET | Freq: Three times a day (TID) | ORAL | Status: DC | PRN

## 2019-07-23 MED ORDER — TETANUS-DIPHTH-ACELL PERTUSSIS 5-2.5-18.5 LF-MCG/0.5 IM SUSP
0.5000 mL | Freq: Once | INTRAMUSCULAR | Status: AC
  Administered 2019-07-24: 0.5 mL via INTRAMUSCULAR
  Filled 2019-07-23: qty 0.5
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
## 2019-07-24 ENCOUNTER — Encounter (HOSPITAL_COMMUNITY): Payer: Self-pay | Admitting: Registered Nurse

## 2019-07-24 DIAGNOSIS — R451 Restlessness and agitation: Secondary | ICD-10-CM

## 2019-07-24 DIAGNOSIS — Z7289 Other problems related to lifestyle: Secondary | ICD-10-CM

## 2019-07-24 LAB — CBC
HCT: 44.1 % (ref 36.0–46.0)
Hemoglobin: 13.6 g/dL (ref 12.0–15.0)
MCH: 26.8 pg (ref 26.0–34.0)
MCHC: 30.8 g/dL (ref 30.0–36.0)
MCV: 86.8 fL (ref 80.0–100.0)
Platelets: 338 10*3/uL (ref 150–400)
RBC: 5.08 MIL/uL (ref 3.87–5.11)
RDW: 13.8 % (ref 11.5–15.5)
WBC: 7.8 10*3/uL (ref 4.0–10.5)
nRBC: 0 % (ref 0.0–0.2)

## 2019-07-24 LAB — COMPREHENSIVE METABOLIC PANEL
ALT: 26 U/L (ref 0–44)
AST: 25 U/L (ref 15–41)
Albumin: 4.3 g/dL (ref 3.5–5.0)
Alkaline Phosphatase: 76 U/L (ref 38–126)
Anion gap: 14 (ref 5–15)
BUN: 13 mg/dL (ref 6–20)
CO2: 23 mmol/L (ref 22–32)
Calcium: 9.4 mg/dL (ref 8.9–10.3)
Chloride: 103 mmol/L (ref 98–111)
Creatinine, Ser: 0.86 mg/dL (ref 0.44–1.00)
GFR calc Af Amer: >60 mL/min (ref >60)
GFR calc non Af Amer: >60 mL/min (ref >60)
Glucose, Bld: 102 mg/dL — ABNORMAL HIGH (ref 70–99)
Potassium: 3.8 mmol/L (ref 3.5–5.1)
Sodium: 140 mmol/L (ref 135–145)
Total Bilirubin: 0.5 mg/dL (ref 0.3–1.2)
Total Protein: 7.8 g/dL (ref 6.5–8.1)

## 2019-07-24 LAB — SALICYLATE LEVEL: Salicylate Lvl: <7 mg/dL — ABNORMAL LOW (ref 7.0–30.0)

## 2019-07-24 LAB — RAPID URINE DRUG SCREEN, HOSP PERFORMED
Amphetamines: NOT DETECTED
Barbiturates: NOT DETECTED
Benzodiazepines: NOT DETECTED
Cocaine: NOT DETECTED
Opiates: NOT DETECTED
Tetrahydrocannabinol: NOT DETECTED

## 2019-07-24 LAB — VALPROIC ACID LEVEL: Valproic Acid Lvl: <10 ug/mL — ABNORMAL LOW (ref 50.0–100.0)

## 2019-07-24 LAB — HCG, QUANTITATIVE, PREGNANCY: hCG, Beta Chain, Quant, S: <1 m[IU]/mL (ref <5)

## 2019-07-24 LAB — ACETAMINOPHEN LEVEL: Acetaminophen (Tylenol), Serum: <10 ug/mL — ABNORMAL LOW (ref 10–30)

## 2019-07-24 LAB — ETHANOL: Alcohol, Ethyl (B): <10 mg/dL (ref <10)

## 2019-07-24 LAB — SARS CORONAVIRUS 2 (TAT 6-24 HRS): SARS Coronavirus 2: NEGATIVE
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
## 2019-07-24 NOTE — B[facility] Counselor (Signed)
Clinician called to speak to [place], RN to see if the pt was ready to be assessed. Per Garen Grams, RN, pt was given Ativan, Cogentin and Zyprexa at 0030, she's still sleeping and unable to engage in TTS assessment. TTS to continue to check back.   Vertell Novak, MS, [facility], [facility] Triage Specialist [phone].

## 2019-07-24 NOTE — ED Notes (Signed)
Megan Manning, Guardian, said we had the wrong phone number for her. Her personal number is [phone].

## 2019-07-24 NOTE — ED Notes (Signed)
TTS called regarding patient's pended labs

## 2019-07-24 NOTE — Progress Notes (Signed)
CSW spoke with Margarette Canada to inform her that patient has been psych cleared and ready for discharge. Ms. Jone Baseman reports she will be here in 30 minutes to pick patient up. RN aware.   Golden Circle, LCSW Transitions of Care Department [facility] ED [phone]

## 2019-07-24 NOTE — ED Provider Notes (Signed)
Emergency Medicine Observation Re-evaluation Note  Megan Manning is a 38 y.o. female, seen on rounds today.  Pt initially presented to the ED for complaints of Suicidal Currently, the patient is sleeping.  Physical Exam  BP (!) 155/80   Pulse (!) 104   Temp 98.7 F (37.1 C) (Oral)   Resp 19   SpO2 93%  Physical Exam  ED Course / MDM  EKG:    I have reviewed the labs performed to date as well as medications administered while in observation.  Recent changes in the last 24 hours include meds for agitation. Plan  Current plan is for TTS eval when more awake. Patient is not under full IVC at this time.   Hayden Rasmussen, MD 07/24/19 [phone]

## 2019-07-24 NOTE — BH Assessment (Signed)
[place] Assessment Progress Note  At 09:37 this writer called the [facility] to ascertain whether pt has an IDD care coordinator.  They report that Davina Poke is her care coordinator.  However, his voice message says that he will not be in the office today.  I called back to the [facility] and was transferred to [place].  She reports that pt has a legal guardian, Danae Chen Fears, through [employer] (phone: [phone]; crisis # [phone]).  She also reports that pt is diagnosed as IDD, but is not able to locate psychometric testing.  At 09:55 I called Erica Fears at the first number listed.  Call rolled to voice mail.  I then called the crisis number and spoke to [facility].  She reports that Danae Chen works for her at [employer], and after indicating that Danae Chen will be out of the office today for the holiday weekend, offers to provide details.  She has since faxed to me pt's letter of guardianship assigning responsibility to [employer], along with psychometric testing and other pertinent diagnostic documentation.  These items will be sent to Medical Records to be scanned into pt's EPIC record.  She reports that pt lives at a [facility] facility supervised by Leland Her [phone]).  Per Shuvon Rankin, FNP, this pt does not require psychiatric hospitalization at this time.  Pt is to be discharged from [facility] to return to her AFL and to follow up with her current outpatient providers.  This has been included in pt's discharge instructions.  At 10:25 I called back to [name] to inform her of disposition.  She asks for a copy of pt's AVS to be faxed to her at [phone].  Shuvon agrees to order a social work consult for this pt to facilitate pt's return to her AFL.  I have called Golden Circle, CSW, to notify her.  Pt's nurse, Melody, has also been notified, and agrees to print a copy of the AVS for me to fax to [facility].  At 10:54 I called Davina Poke, pt's [facility] coordinator, leaving a message to notify him of pt's disposition.  Jalene Mullet, [place] Coordinator [phone]

## 2019-07-24 NOTE — Discharge Instructions (Signed)
For your behavioral health needs, you are advised to continue treatment with your current outpatient providers. °

## 2019-09-13 ENCOUNTER — Emergency Department (HOSPITAL_COMMUNITY)
Admission: EM | Admit: 2019-09-13 | Discharge: 2019-09-13 | Disposition: A | Discharge status: Home / Self Care | Payer: Medicaid Other | Attending: Emergency Medicine | Admitting: Emergency Medicine

## 2019-09-13 ENCOUNTER — Other Ambulatory Visit: Payer: Self-pay

## 2019-09-13 ENCOUNTER — Ambulatory Visit (HOSPITAL_COMMUNITY)
Admission: RE | Admit: 2019-09-13 | Discharge: 2019-09-13 | Disposition: A | Discharge status: Home / Self Care | Payer: Medicaid Other | Attending: Psychiatry | Admitting: Psychiatry

## 2019-09-13 ENCOUNTER — Emergency Department (HOSPITAL_COMMUNITY): Payer: Medicaid Other

## 2019-09-13 DIAGNOSIS — E119 Type 2 diabetes mellitus without complications: Secondary | ICD-10-CM | POA: Insufficient documentation

## 2019-09-13 DIAGNOSIS — F309 Manic episode, unspecified: Secondary | ICD-10-CM | POA: Insufficient documentation

## 2019-09-13 DIAGNOSIS — R05 Cough: Secondary | ICD-10-CM | POA: Insufficient documentation

## 2019-09-13 DIAGNOSIS — Z7984 Long term (current) use of oral hypoglycemic drugs: Secondary | ICD-10-CM | POA: Diagnosis not present

## 2019-09-13 DIAGNOSIS — Z20822 Contact with and (suspected) exposure to covid-19: Secondary | ICD-10-CM | POA: Diagnosis not present

## 2019-09-13 DIAGNOSIS — Z79899 Other long term (current) drug therapy: Secondary | ICD-10-CM | POA: Insufficient documentation

## 2019-09-13 DIAGNOSIS — F329 Major depressive disorder, single episode, unspecified: Secondary | ICD-10-CM | POA: Diagnosis not present

## 2019-09-13 DIAGNOSIS — I10 Essential (primary) hypertension: Secondary | ICD-10-CM | POA: Diagnosis not present

## 2019-09-13 DIAGNOSIS — F603 Borderline personality disorder: Secondary | ICD-10-CM | POA: Insufficient documentation

## 2019-09-13 DIAGNOSIS — R630 Anorexia: Secondary | ICD-10-CM | POA: Insufficient documentation

## 2019-09-13 DIAGNOSIS — R0602 Shortness of breath: Secondary | ICD-10-CM | POA: Insufficient documentation

## 2019-09-13 DIAGNOSIS — F79 Unspecified intellectual disabilities: Secondary | ICD-10-CM | POA: Diagnosis not present

## 2019-09-13 DIAGNOSIS — R079 Chest pain, unspecified: Secondary | ICD-10-CM | POA: Diagnosis not present

## 2019-09-13 DIAGNOSIS — F3010 Manic episode without psychotic symptoms, unspecified: Secondary | ICD-10-CM

## 2019-09-13 DIAGNOSIS — Z1389 Encounter for screening for other disorder: Secondary | ICD-10-CM | POA: Insufficient documentation

## 2019-09-13 LAB — COMPREHENSIVE METABOLIC PANEL
ALT: 30 U/L (ref 0–44)
AST: 34 U/L (ref 15–41)
Albumin: 4.8 g/dL (ref 3.5–5.0)
Alkaline Phosphatase: 66 U/L (ref 38–126)
Anion gap: 14 (ref 5–15)
BUN: 13 mg/dL (ref 6–20)
CO2: 23 mmol/L (ref 22–32)
Calcium: 9.5 mg/dL (ref 8.9–10.3)
Chloride: 101 mmol/L (ref 98–111)
Creatinine, Ser: 0.83 mg/dL (ref 0.44–1.00)
GFR calc Af Amer: >60 mL/min (ref >60)
GFR calc non Af Amer: >60 mL/min (ref >60)
Glucose, Bld: 90 mg/dL (ref 70–99)
Potassium: 3.6 mmol/L (ref 3.5–5.1)
Sodium: 138 mmol/L (ref 135–145)
Total Bilirubin: 1.1 mg/dL (ref 0.3–1.2)
Total Protein: 8.5 g/dL — ABNORMAL HIGH (ref 6.5–8.1)

## 2019-09-13 LAB — CBC
HCT: 47.3 % — ABNORMAL HIGH (ref 36.0–46.0)
Hemoglobin: 15.2 g/dL — ABNORMAL HIGH (ref 12.0–15.0)
MCH: 27.4 pg (ref 26.0–34.0)
MCHC: 32.1 g/dL (ref 30.0–36.0)
MCV: 85.4 fL (ref 80.0–100.0)
Platelets: 350 10*3/uL (ref 150–400)
RBC: 5.54 MIL/uL — ABNORMAL HIGH (ref 3.87–5.11)
RDW: 13.2 % (ref 11.5–15.5)
WBC: 6.7 10*3/uL (ref 4.0–10.5)
nRBC: 0 % (ref 0.0–0.2)

## 2019-09-13 LAB — RAPID URINE DRUG SCREEN, HOSP PERFORMED
Amphetamines: NOT DETECTED
Barbiturates: NOT DETECTED
Benzodiazepines: NOT DETECTED
Cocaine: NOT DETECTED
Opiates: NOT DETECTED
Tetrahydrocannabinol: NOT DETECTED

## 2019-09-13 LAB — ACETAMINOPHEN LEVEL: Acetaminophen (Tylenol), Serum: <10 ug/mL — ABNORMAL LOW (ref 10–30)

## 2019-09-13 LAB — I-STAT BETA HCG BLOOD, ED (MC, WL, AP ONLY): I-stat hCG, quantitative: <5 m[IU]/mL (ref <5)

## 2019-09-13 LAB — SALICYLATE LEVEL: Salicylate Lvl: <7 mg/dL — ABNORMAL LOW (ref 7.0–30.0)

## 2019-09-13 LAB — ETHANOL: Alcohol, Ethyl (B): <10 mg/dL (ref <10)

## 2019-09-13 LAB — SARS CORONAVIRUS 2 (TAT 6-24 HRS): SARS Coronavirus 2: NEGATIVE

## 2019-09-13 MED ORDER — ALBUTEROL SULFATE HFA 108 (90 BASE) MCG/ACT IN AERS: 2.0000 | INHALATION_SPRAY | Freq: Four times a day (QID) | RESPIRATORY_TRACT | Status: DC | PRN

## 2019-09-13 MED ORDER — FLUTICASONE PROPIONATE 50 MCG/ACT NA SUSP: 1.0000 | Freq: Every day | NASAL | Status: DC

## 2019-09-13 MED ORDER — METOPROLOL SUCCINATE ER 25 MG PO TB24: 25.0000 mg | ORAL_TABLET | Freq: Every day | ORAL | Status: DC

## 2019-09-13 MED ORDER — LEVOTHYROXINE SODIUM 88 MCG PO TABS: 88.0000 ug | ORAL_TABLET | Freq: Every day | ORAL | Status: DC

## 2019-09-13 MED ORDER — FUROSEMIDE 20 MG PO TABS: 20.0000 mg | ORAL_TABLET | Freq: Every day | ORAL | Status: DC

## 2019-09-13 MED ORDER — TRAZODONE HCL 50 MG PO TABS: 50.0000 mg | ORAL_TABLET | Freq: Every day | ORAL | Status: DC

## 2019-09-13 MED ORDER — HYDROXYZINE HCL 25 MG PO TABS
25.0000 mg | ORAL_TABLET | Freq: Three times a day (TID) | ORAL | Status: DC
  Administered 2019-09-13: 25 mg via ORAL
  Filled 2019-09-13: qty 1

## 2019-09-13 NOTE — Progress Notes (Signed)
Patient ID: Megan Manning, female   DOB: August 28, 1980, 39 y.o.   MRN: [id number] Patient was sent to [facility] for medical evaluation. Patient is psychiatrically clear for discharge.

## 2019-09-13 NOTE — Care Management (Addendum)
Per Deidre, LCSW the patient is cleared and can be discharged. Writer contacted the group home [facility]) and she will pick up the patient at 7pm.  Clinical research associate informed the Char[employer] fundraiser.  Rn reports that she will follow up with the MD.

## 2019-09-13 NOTE — Discharge Instructions (Signed)
Follow up with your doctor.  Return to the ED for worsening
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
## 2019-09-13 NOTE — H&P (Signed)
Behavioral Health Medical Screening Exam  Megan Manning is an 39 y.o. female with history of depression, borderline personality disorder, and intellectual disability. She is seen with her group home staff member. She was brought in due to not eating or taking medications since Sunday. Group home staff member reports she has also not been sleeping for the last two nights and appears paranoid. The patient reports that she has not eaten/taken medication because "I don't feel good. I don't have an appetite." Patient reports feeling overheated, dizzy, diaphoretic, chest pain, SOB, and difficulty voiding. She coughs several times during assessment. She denies SI/HI/AVH. Group home staff member agrees to transport patient to [facility] for medical evaluation.  Total Time spent with patient: 15 minutes  Psychiatric Specialty Exam: Physical Exam  Vitals reviewed. Constitutional: She is oriented to person, place, and time. She appears well-developed and well-nourished.  Cardiovascular: Normal rate.  Respiratory: Effort normal.  Neurological: She is alert and oriented to person, place, and time.    Review of Systems  Constitutional: Positive for appetite change and fatigue.  Respiratory: Positive for cough and shortness of breath.   Cardiovascular: Positive for chest pain.  Neurological: Positive for dizziness and weakness.  Psychiatric/Behavioral: Positive for dysphoric mood and sleep disturbance. Negative for agitation, hallucinations, self-injury and suicidal ideas.    Blood pressure (!) 156/94, pulse 95, temperature 98.7 F (37.1 C), temperature source Oral, resp. rate 20, SpO2 100 %.There is no height or weight on file to calculate BMI.  General Appearance: Casual  Eye Contact:  Fair  Speech:  Normal Rate  Volume:  Increased  Mood:  Depressed  Affect:  Constricted  Thought Process:  Coherent  Orientation:  Full (Time, Place, and Person)  Thought Content:  Logical  Suicidal Thoughts:  No   Homicidal Thoughts:  No  Memory:  Immediate;   Fair Recent;   Fair Remote;   Fair  Judgement:  Other:  Limited (intellectual disability)  Insight:  Limited (intellectual disability)  Psychomotor Activity:  Normal  Concentration: Concentration: Fair and Attention Span: Fair  Recall:  [employer] of Knowledge:Poor  Language: Fair  Akathisia:  No  Handed:  Right  AIMS (if indicated):     Assets:  Communication Skills Desire for Improvement Housing Social Support  Sleep:       Musculoskeletal: Strength & Muscle Tone: within normal limits Gait & Station: normal Patient leans: N/A  Blood pressure (!) 156/94, pulse 95, temperature 98.7 F (37.1 C), temperature source Oral, resp. rate 20, SpO2 100 %.  Recommendations:  Based on my evaluation the patient appears to have an emergency medical condition for which I recommend the patient be transferred to the emergency department for further evaluation.  Aldean Baker, NP 09/13/2019, 12:05 PM

## 2019-09-13 NOTE — ED Triage Notes (Signed)
Pt presents to the ED from a grouphome. Patient reportedly has not been eating, drinking, sleeping, or taking her medications for 4 days.  Patient is reportedly on Lasix and has not been peeing very much, her caregiver reports maybe once in the evening.

## 2019-09-13 NOTE — ED Notes (Addendum)
2 Pt belonging Bags located in locker 31

## 2019-09-13 NOTE — ED Notes (Signed)
Family at bedside. 
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
## 2019-10-08 ENCOUNTER — Ambulatory Visit (HOSPITAL_COMMUNITY)
Admission: RE | Admit: 2019-10-08 | Discharge: 2019-10-08 | Disposition: A | Discharge status: Home / Self Care | Payer: Medicaid Other | Attending: Psychiatry | Admitting: Psychiatry

## 2019-10-08 DIAGNOSIS — F329 Major depressive disorder, single episode, unspecified: Secondary | ICD-10-CM | POA: Insufficient documentation

## 2019-10-08 DIAGNOSIS — F603 Borderline personality disorder: Secondary | ICD-10-CM | POA: Diagnosis not present

## 2019-10-08 NOTE — H&P (Signed)
Behavioral Health Medical Screening Exam  Megan Manning is an 39 y.o. female.  Total Time spent with patient: 40 minutes  39 year old patient with a pmhx for Depression, Intellectual disabilities and borderline personality disorder presents to [facility] walk in accompanied by her group home staff.  Patient was brought in for evaluation of paranoid behaviors.  The patient states she has not taken her medications since yesterday because she thought the staff was trying to poison her.  She does not provide any additional information to qualify her belief.  The patient was previously seen for similar concerns a few weeks prior and discharged home for outpatient follow-up.    Collateral information received from group home staff who reports patient has been distrustful and not wanting to take medications from her.  She states over the past few weeks intermittently, the group home owner had to come to the home to give the patient her medications.  Currently the group home owner is out of town and there is no on else who can assist.  States on the direction of the group home owner, she brought the patient in for evaluation.    The patient denies suicidal or homicidal concerns.  She denies audible or visual hallucinations and does not appear to respond to internal stimulus during the assessment.  She does exhibit some delusions and paranoia but reports that she feels safe at home with her staff member.   Psychiatric Specialty Exam: Physical Exam  Constitutional: She is oriented to person, place, and time.  Obese patient  HENT:  Head: Normocephalic.  Eyes: Pupils are equal, round, and reactive to light.  Respiratory: Effort normal.  Musculoskeletal:        General: Normal range of motion.     Cervical back: Normal range of motion.  Neurological: She is alert and oriented to person, place, and time.    Review of Systems  Psychiatric/Behavioral: Positive for dysphoric mood. Negative for self-injury, sleep  disturbance and suicidal ideas.    Blood pressure (!) 131/106, pulse (!) 145, temperature 98.6 F (37 C), temperature source Oral, resp. rate 20, SpO2 94 %.There is no height or weight on file to calculate BMI.  General Appearance: Casual  Eye Contact:  Fair  Speech:  Normal Rate  Volume:  Normal  Mood:  Depressed  Affect:  Congruent and Tearful  Thought Process:  Coherent and Descriptions of Associations: Tangential  Orientation:  Full (Time, Place, and Person)  Thought Content:  Logical  Suicidal Thoughts:  No  Homicidal Thoughts:  No  Memory:  Immediate;   Fair Recent;   Fair Remote;   Fair  Judgement:  Other:  paitent is limited with IDD  Insight:  Lacking  Psychomotor Activity:  Normal  Concentration: Concentration: Fair and Attention Span: Fair  Recall:  states she took her medications yesterday  Fund of Knowledge:Poor based on intellectual disabilities  Language: Fair  Akathisia:  Negative  Handed:  Right  AIMS (if indicated):     Assets:  Communication Skills Desire for Improvement Housing Resilience Social Support  Sleep:   >6 hours    Musculoskeletal: Strength & Muscle Tone: within normal limits Gait & Station: normal Patient leans: N/A  Blood pressure (!) 131/106, pulse (!) 145, temperature 98.6 F (37 C), temperature source Oral, resp. rate 20, SpO2 94 %.  Recommendations:  Based on my evaluation the patient does not appear to have an emergency medical condition.  Patient does not meet criteria for inpatient admissions.  Patient agrees to return home and to take her medications as prescribed; she also states she will allow her staff member to give her the medications without further concern.  Recommend follow-up with outpatient psychiatrist on Monday March 15th for which the group home staff members agrees to take the patient.    Chales Abrahams, NP 10/08/2019, 6:12 PM
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
## 2019-10-10 MED ORDER — LOPERAMIDE HCL 2 MG PO CAPS
2.00 | ORAL_CAPSULE | ORAL | Status: DC
Start: ? — End: 2019-10-10

## 2019-10-10 MED ORDER — NICOTINE POLACRILEX 2 MG MT GUM
2.00 | CHEWING_GUM | OROMUCOSAL | Status: DC
Start: ? — End: 2019-10-10

## 2019-10-10 MED ORDER — ACETAMINOPHEN 325 MG PO TABS
650.00 | ORAL_TABLET | ORAL | Status: DC
Start: ? — End: 2019-10-10

## 2019-10-10 MED ORDER — BISACODYL 5 MG PO TBEC
10.00 | DELAYED_RELEASE_TABLET | ORAL | Status: DC
Start: ? — End: 2019-10-10

## 2019-10-10 MED ORDER — SERTRALINE HCL 50 MG PO TABS
50.00 | ORAL_TABLET | ORAL | Status: DC
Start: 2019-10-11 — End: 2019-10-10

## 2019-10-10 MED ORDER — CLONIDINE HCL 0.1 MG PO TABS
0.10 | ORAL_TABLET | ORAL | Status: DC
Start: ? — End: 2019-10-10

## 2019-10-10 MED ORDER — GENERIC EXTERNAL MEDICATION
Status: DC
Start: ? — End: 2019-10-10

## 2019-10-10 MED ORDER — PALIPERIDONE ER 6 MG PO TB24
6.00 | ORAL_TABLET | ORAL | Status: DC
Start: 2019-10-11 — End: 2019-10-10

## 2019-10-10 MED ORDER — TRAZODONE HCL 50 MG PO TABS
50.00 | ORAL_TABLET | ORAL | Status: DC
Start: ? — End: 2019-10-10

## 2019-10-10 MED ORDER — BENZOCAINE-MENTHOL 6-10 MG MT LOZG
1.00 | LOZENGE | OROMUCOSAL | Status: DC
Start: ? — End: 2019-10-10

## 2019-10-10 MED ORDER — ALUM & MAG HYDROXIDE-SIMETH 200-200-20 MG/5ML PO SUSP
30.00 | ORAL | Status: DC
Start: ? — End: 2019-10-10

## 2019-10-10 MED ORDER — METOPROLOL SUCCINATE ER 25 MG PO TB24
25.00 | ORAL_TABLET | ORAL | Status: DC
Start: 2019-10-11 — End: 2019-10-10

## 2019-10-10 MED ORDER — NITROFURANTOIN MONOHYD MACRO 100 MG PO CAPS
100.00 | ORAL_CAPSULE | ORAL | Status: DC
Start: 2019-10-10 — End: 2019-10-10

## 2019-10-10 MED ORDER — FUROSEMIDE 20 MG PO TABS
20.00 | ORAL_TABLET | ORAL | Status: DC
Start: 2019-10-11 — End: 2019-10-10

## 2019-10-10 MED ORDER — HYDROXYZINE HCL 25 MG PO TABS
50.00 | ORAL_TABLET | ORAL | Status: DC
Start: ? — End: 2019-10-10

## 2019-10-10 MED ORDER — CETIRIZINE HCL 10 MG PO TABS
10.00 | ORAL_TABLET | ORAL | Status: DC
Start: ? — End: 2019-10-10

## 2019-10-10 MED ORDER — ONDANSETRON 4 MG PO TBDP
4.00 | ORAL_TABLET | ORAL | Status: DC
Start: ? — End: 2019-10-10

## 2019-10-10 MED ORDER — SALINE NASAL SPRAY 0.65 % NA SOLN
1.00 | NASAL | Status: DC
Start: ? — End: 2019-10-10

## 2019-10-10 MED ORDER — QUINTABS PO TABS
1.00 | ORAL_TABLET | ORAL | Status: DC
Start: 2019-10-11 — End: 2019-10-10

## 2019-10-10 MED ORDER — GUAIFENESIN 100 MG/5ML PO SYRP
200.00 | ORAL_SOLUTION | ORAL | Status: DC
Start: ? — End: 2019-10-10

## 2020-01-09 ENCOUNTER — Encounter (HOSPITAL_COMMUNITY): Payer: Self-pay

## 2020-01-09 ENCOUNTER — Other Ambulatory Visit: Payer: Self-pay

## 2020-01-09 ENCOUNTER — Emergency Department (HOSPITAL_COMMUNITY)
Admission: EM | Admit: 2020-01-09 | Discharge: 2020-01-10 | Disposition: A | Discharge status: Home / Self Care | Payer: Medicaid Other | Attending: Emergency Medicine | Admitting: Emergency Medicine

## 2020-01-09 ENCOUNTER — Ambulatory Visit (HOSPITAL_BASED_OUTPATIENT_CLINIC_OR_DEPARTMENT_OTHER)
Admission: RE | Admit: 2020-01-09 | Discharge: 2020-01-09 | Disposition: A | Discharge status: Home / Self Care | Payer: Medicaid Other | Source: Home / Self Care | Attending: Psychiatry | Admitting: Psychiatry

## 2020-01-09 DIAGNOSIS — Z915 Personal history of self-harm: Secondary | ICD-10-CM | POA: Insufficient documentation

## 2020-01-09 DIAGNOSIS — K921 Melena: Secondary | ICD-10-CM

## 2020-01-09 DIAGNOSIS — F4322 Adjustment disorder with anxiety: Secondary | ICD-10-CM

## 2020-01-09 DIAGNOSIS — F603 Borderline personality disorder: Secondary | ICD-10-CM | POA: Diagnosis present

## 2020-01-09 DIAGNOSIS — I10 Essential (primary) hypertension: Secondary | ICD-10-CM | POA: Diagnosis not present

## 2020-01-09 DIAGNOSIS — Z7951 Long term (current) use of inhaled steroids: Secondary | ICD-10-CM | POA: Insufficient documentation

## 2020-01-09 DIAGNOSIS — F419 Anxiety disorder, unspecified: Secondary | ICD-10-CM

## 2020-01-09 DIAGNOSIS — Z79899 Other long term (current) drug therapy: Secondary | ICD-10-CM | POA: Insufficient documentation

## 2020-01-09 DIAGNOSIS — R079 Chest pain, unspecified: Secondary | ICD-10-CM | POA: Diagnosis not present

## 2020-01-09 DIAGNOSIS — R443 Hallucinations, unspecified: Secondary | ICD-10-CM | POA: Insufficient documentation

## 2020-01-09 DIAGNOSIS — F329 Major depressive disorder, single episode, unspecified: Secondary | ICD-10-CM | POA: Diagnosis not present

## 2020-01-09 DIAGNOSIS — Z20822 Contact with and (suspected) exposure to covid-19: Secondary | ICD-10-CM | POA: Insufficient documentation

## 2020-01-09 DIAGNOSIS — E119 Type 2 diabetes mellitus without complications: Secondary | ICD-10-CM | POA: Diagnosis not present

## 2020-01-09 DIAGNOSIS — Z7289 Other problems related to lifestyle: Secondary | ICD-10-CM
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
## 2020-01-09 NOTE — ED Triage Notes (Signed)
Pt complains of prolonged anxiety attacks brought on by stress, she also states that she's noticed blood in her stool for three days

## 2020-01-09 NOTE — ED Triage Notes (Signed)
EMS was called to [facility] for the patient, the patient was either about to check in or visiting someone
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
## 2020-01-09 NOTE — BHH Counselor (Signed)
Clinician spoke to [facility], [phone]) CEO of [employer], CEO to gather additional information. Clinician noted the pt has been doing good but is not taking her medications and psychotic. Pt has been verbally and physically aggressive.   Clinician spoke to Elonda Husky [phone]) pt's guardian with [employer] to discuss the pt's concerns with the AFL provider. Per guardian, she is inform clinical team of pt's concerns and [facility] recommendation.    Redmond Pulling, MS, [facility], [facility] Triage Specialist [phone]
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
## 2020-01-10 DIAGNOSIS — F419 Anxiety disorder, unspecified: Secondary | ICD-10-CM

## 2020-01-10 LAB — CBC WITH DIFFERENTIAL/PLATELET
Abs Immature Granulocytes: 0.01 10*3/uL (ref 0.00–0.07)
Basophils Absolute: 0.1 10*3/uL (ref 0.0–0.1)
Basophils Relative: 1 %
Eosinophils Absolute: 0.2 10*3/uL (ref 0.0–0.5)
Eosinophils Relative: 3 %
HCT: 39.2 % (ref 36.0–46.0)
Hemoglobin: 12.3 g/dL (ref 12.0–15.0)
Immature Granulocytes: 0 %
Lymphocytes Relative: 34 %
Lymphs Abs: 2.3 10*3/uL (ref 0.7–4.0)
MCH: 28.1 pg (ref 26.0–34.0)
MCHC: 31.4 g/dL (ref 30.0–36.0)
MCV: 89.5 fL (ref 80.0–100.0)
Monocytes Absolute: 0.7 10*3/uL (ref 0.1–1.0)
Monocytes Relative: 9 %
Neutro Abs: 3.6 10*3/uL (ref 1.7–7.7)
Neutrophils Relative %: 53 %
Platelets: 202 10*3/uL (ref 150–400)
RBC: 4.38 MIL/uL (ref 3.87–5.11)
RDW: 12.6 % (ref 11.5–15.5)
WBC: 6.9 10*3/uL (ref 4.0–10.5)
nRBC: 0 % (ref 0.0–0.2)

## 2020-01-10 LAB — COMPREHENSIVE METABOLIC PANEL
ALT: 9 U/L (ref 0–44)
AST: 15 U/L (ref 15–41)
Albumin: 4 g/dL (ref 3.5–5.0)
Alkaline Phosphatase: 44 U/L (ref 38–126)
Anion gap: 11 (ref 5–15)
BUN: 10 mg/dL (ref 6–20)
CO2: 26 mmol/L (ref 22–32)
Calcium: 8.5 mg/dL — ABNORMAL LOW (ref 8.9–10.3)
Chloride: 104 mmol/L (ref 98–111)
Creatinine, Ser: 0.57 mg/dL (ref 0.44–1.00)
GFR calc Af Amer: >60 mL/min (ref >60)
GFR calc non Af Amer: >60 mL/min (ref >60)
Glucose, Bld: 88 mg/dL (ref 70–99)
Potassium: 3.8 mmol/L (ref 3.5–5.1)
Sodium: 141 mmol/L (ref 135–145)
Total Bilirubin: 0.5 mg/dL (ref 0.3–1.2)
Total Protein: 7.2 g/dL (ref 6.5–8.1)

## 2020-01-10 LAB — RAPID URINE DRUG SCREEN, HOSP PERFORMED
Amphetamines: NOT DETECTED
Barbiturates: NOT DETECTED
Benzodiazepines: NOT DETECTED
Cocaine: NOT DETECTED
Opiates: NOT DETECTED
Tetrahydrocannabinol: NOT DETECTED

## 2020-01-10 LAB — ETHANOL: Alcohol, Ethyl (B): <10 mg/dL (ref <10)

## 2020-01-10 LAB — SARS CORONAVIRUS 2 BY RT PCR (HOSPITAL ORDER, PERFORMED IN ~~LOC~~ HOSPITAL LAB): SARS Coronavirus 2: NEGATIVE

## 2020-01-10 MED ORDER — ACETAMINOPHEN 500 MG PO TABS
1000.0000 mg | ORAL_TABLET | Freq: Four times a day (QID) | ORAL | Status: DC | PRN
  Administered 2020-01-10: 1000 mg via ORAL
  Filled 2020-01-10: qty 2

## 2020-01-10 MED ORDER — ONDANSETRON 4 MG PO TBDP
4.0000 mg | ORAL_TABLET | Freq: Three times a day (TID) | ORAL | Status: DC | PRN
  Administered 2020-01-10: 4 mg via ORAL
  Filled 2020-01-10: qty 1
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
## 2020-01-10 NOTE — ED Notes (Signed)
Belongings gathered and placed in secure area.
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
## 2020-01-10 NOTE — Progress Notes (Signed)
TOC CM spoke to pt's caregiver, Silvano Bilis # [phone]. She will come to pick up patient. ED provider updated. Isidoro Donning RN CCM, [facility] ED TOC CM [phone]
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
## 2020-01-10 NOTE — ED Provider Notes (Signed)
Blood pressure 127/81, pulse 85, temperature 99.5 F (37.5 C), temperature source Oral, resp. rate 16, SpO2 95 %.  Assuming care from Dr. Eudelia Bunch.  In short, Megan Manning is a 39 y.o. female with a chief complaint of Rectal Bleeding and Anxiety .  Refer to the original H&P for additional details.  The current plan of care is to f/u with TTS.  01:25 PM  Patient has been psychiatrically cleared by TTS.  Patient will have a ride at 6 PM to go home.     Maia Plan, MD 01/11/20 [phone]
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
## 2020-01-10 NOTE — ED Notes (Signed)
PT provided meal tray

## 2020-01-10 NOTE — [facility] Counselor (Signed)
Clinicain spoke to [place], Charity fundraiser to discuss pt was assessed at [facility] as a walk-in. Pt sent to [facility] for medical clearance and to be observed/reassessed by psychiatry.  Pt's TTS assessment and other documentation is in previous [facility] encounter.   Redmond Pulling, MS, [facility] Triage Specialist [phone]

## 2020-01-12 LAB — I-STAT BETA HCG BLOOD, ED (MC, WL, AP ONLY): I-stat hCG, quantitative: <5 m[IU]/mL (ref <5)

## 2020-01-31 ENCOUNTER — Emergency Department (HOSPITAL_COMMUNITY): Payer: Medicaid Other

## 2020-01-31 ENCOUNTER — Encounter (HOSPITAL_COMMUNITY): Payer: Self-pay | Admitting: Emergency Medicine

## 2020-01-31 ENCOUNTER — Emergency Department (HOSPITAL_COMMUNITY)
Admission: EM | Admit: 2020-01-31 | Discharge: 2020-02-01 | Disposition: A | Discharge status: Home / Self Care | Payer: Medicaid Other | Attending: Emergency Medicine | Admitting: Emergency Medicine

## 2020-01-31 ENCOUNTER — Other Ambulatory Visit: Payer: Self-pay

## 2020-01-31 DIAGNOSIS — S43401A Unspecified sprain of right shoulder joint, initial encounter: Secondary | ICD-10-CM | POA: Diagnosis not present

## 2020-01-31 DIAGNOSIS — S4990XA Unspecified injury of shoulder and upper arm, unspecified arm, initial encounter: Secondary | ICD-10-CM | POA: Diagnosis present

## 2020-01-31 DIAGNOSIS — E039 Hypothyroidism, unspecified: Secondary | ICD-10-CM | POA: Insufficient documentation

## 2020-01-31 DIAGNOSIS — Y92009 Unspecified place in unspecified non-institutional (private) residence as the place of occurrence of the external cause: Secondary | ICD-10-CM | POA: Insufficient documentation

## 2020-01-31 DIAGNOSIS — E119 Type 2 diabetes mellitus without complications: Secondary | ICD-10-CM | POA: Insufficient documentation

## 2020-01-31 DIAGNOSIS — I10 Essential (primary) hypertension: Secondary | ICD-10-CM | POA: Insufficient documentation

## 2020-01-31 DIAGNOSIS — S53401A Unspecified sprain of right elbow, initial encounter: Secondary | ICD-10-CM | POA: Insufficient documentation

## 2020-01-31 DIAGNOSIS — Y93F2 Activity, caregiving, lifting: Secondary | ICD-10-CM | POA: Insufficient documentation

## 2020-01-31 DIAGNOSIS — Y999 Unspecified external cause status: Secondary | ICD-10-CM | POA: Diagnosis not present

## 2020-01-31 DIAGNOSIS — X501XXA Overexertion from prolonged static or awkward postures, initial encounter: Secondary | ICD-10-CM | POA: Insufficient documentation

## 2020-01-31 MED ORDER — ACETAMINOPHEN 325 MG PO TABS
650.0000 mg | ORAL_TABLET | Freq: Once | ORAL | Status: AC
  Administered 2020-01-31: 650 mg via ORAL
  Filled 2020-01-31: qty 2

## 2020-01-31 NOTE — ED Triage Notes (Signed)
Pt had pack of pens and caregiver reports that she eats things. Was trying to get package away from her and her right arm bent backwards. Pt c/o pains from right shoulder to right hand,

## 2020-01-31 NOTE — Discharge Instructions (Addendum)
Use the sling for comfort.  Take over-the-counter Tylenol for pain.  Schedule a follow-up appointment with the orthopedic doctor.
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
## 2021-04-07 ENCOUNTER — Other Ambulatory Visit: Payer: Self-pay

## 2021-04-07 ENCOUNTER — Emergency Department (INDEPENDENT_AMBULATORY_CARE_PROVIDER_SITE_OTHER)
Admission: EM | Admit: 2021-04-07 | Discharge: 2021-04-07 | Disposition: A | Discharge status: Home / Self Care | Payer: Medicaid Other | Source: Home / Self Care | Attending: Family Medicine | Admitting: Family Medicine

## 2021-04-07 ENCOUNTER — Emergency Department (INDEPENDENT_AMBULATORY_CARE_PROVIDER_SITE_OTHER): Payer: Medicaid Other

## 2021-04-07 DIAGNOSIS — R1013 Epigastric pain: Secondary | ICD-10-CM

## 2021-04-07 DIAGNOSIS — R030 Elevated blood-pressure reading, without diagnosis of hypertension: Secondary | ICD-10-CM

## 2021-04-07 DIAGNOSIS — F79 Unspecified intellectual disabilities: Secondary | ICD-10-CM

## 2021-04-07 HISTORY — DX: Pica in adults: F50.83

## 2021-04-07 HISTORY — DX: Other specified eating disorder: F50.89

## 2021-04-07 HISTORY — DX: Lymphedema, not elsewhere classified: I89.0

## 2021-04-07 MED ORDER — ONDANSETRON 4 MG PO TBDP
4.0000 mg | ORAL_TABLET | Freq: Once | ORAL | Status: AC
  Administered 2021-04-07: 4 mg via ORAL

## 2021-04-07 MED ORDER — POLYETHYLENE GLYCOL 3350 17 G PO PACK: PACK | ORAL | 0 refills | Status: DC

## 2021-04-07 NOTE — Discharge Instructions (Addendum)
X-ray is negative.  I does show that she has moderate constipation.  Give MiraLAX daily for the next 7 days.  Talk to your primary care doctor at follow-up   Talk to your primary care doctor about your elevated blood pressure.  You may need to increase your metoprolol.
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
## 2021-04-07 NOTE — ED Triage Notes (Signed)
Pt presents to Urgent Care with c/o sharp upper abdominal pain and nausea x 1 month. Denies diarrhea. Reports stool is sometimes dark black.

## 2021-11-07 ENCOUNTER — Other Ambulatory Visit (HOSPITAL_COMMUNITY): Payer: Self-pay | Admitting: Nurse Practitioner

## 2021-11-07 DIAGNOSIS — I10 Essential (primary) hypertension: Secondary | ICD-10-CM

## 2021-11-10 ENCOUNTER — Ambulatory Visit (HOSPITAL_BASED_OUTPATIENT_CLINIC_OR_DEPARTMENT_OTHER): Admission: RE | Admit: 2021-11-10 | Payer: Medicaid Other | Source: Ambulatory Visit

## 2021-12-12 ENCOUNTER — Ambulatory Visit (HOSPITAL_COMMUNITY): Admission: RE | Admit: 2021-12-12 | Payer: Medicaid Other | Source: Ambulatory Visit

## 2021-12-24 ENCOUNTER — Encounter: Payer: Self-pay | Admitting: Cardiology

## 2021-12-24 ENCOUNTER — Ambulatory Visit (INDEPENDENT_AMBULATORY_CARE_PROVIDER_SITE_OTHER): Payer: Medicaid Other | Admitting: Cardiology

## 2021-12-24 VITALS — BP 110/70 | HR 99 | Ht 63.0 in | Wt 267.0 lb

## 2021-12-24 DIAGNOSIS — R079 Chest pain, unspecified: Secondary | ICD-10-CM | POA: Diagnosis not present

## 2021-12-24 DIAGNOSIS — R002 Palpitations: Secondary | ICD-10-CM

## 2021-12-24 DIAGNOSIS — R0789 Other chest pain: Secondary | ICD-10-CM | POA: Insufficient documentation

## 2021-12-24 NOTE — Progress Notes (Signed)
Cardiology Office Note:    Date:  12/24/2021   ID:  Megan Manning, DOB 1981-07-05, MRN [id number]  PCP:  Megan Simmer, MD   [facility] Providers Cardiologist:  Megan Furbish, MD     Referring MD: Megan Simmer, MD   History of Present Illness:    Megan Manning is a 41 y.o. female here for the evaluation of palpitations, atypical chest pain at the request of Dr. Izola Manning.  Referral notes from Dr. Izola Manning personally reviewed. Noted to have bipolar disorder, intellectual disability, and borderline personality disorder.  She had presented to the [facility] on 05/05/2021 with concerns for chest pain. At that time she was being cared for in assisted living. Her EKG was normal and troponins were negative. There was also some concern for resting tachycardia.  Today: She is accompanied by a family member, who also provides some history.  Since yesterday, she complains of episodes of a stinging pain in her chest associated with skipped heart beats. She describes her pain as "like needles." She also notes associated shortness of breath. She needed to sit and rest after getting dressed. Her episodes have occurred when she is lying down in bed or while walking.    They report she has lost over 100 lbs.  She denies any or peripheral edema. No lightheadedness, headaches, syncope, orthopnea, or PND.   Past Medical History:  Diagnosis Date   Adjustment disorder    Borderline personality disorder ([place])    Diabetes mellitus without complication ([place])    Hypertension    Hyperthyroidism    Intellectual disability    Lactic acidosis    Lymphedema    Obesity    Overdose    Pica in adults    Thyroid disease     Past Surgical History:  Procedure Laterality Date   COLONOSCOPY WITH PROPOFOL N/A 12/22/2016   Procedure: COLONOSCOPY WITH PROPOFOL;  Surgeon: [name], MD;  Location: [facility] ENDOSCOPY;  Service: Endoscopy;  Laterality: N/A;   NO PAST SURGERIES       Current Medications: Current Meds  Medication Sig   albuterol (VENTOLIN HFA) 108 (90 Base) MCG/ACT inhaler Inhale 2 puffs into the lungs every 6 (six) hours as needed for wheezing or shortness of breath.    atorvastatin (LIPITOR) 20 MG tablet Take 20 mg by mouth at bedtime.   divalproex (DEPAKOTE ER) 250 MG 24 hr tablet Take 250-500 mg by mouth See admin instructions. 250mg  in AM and 500mg  QHS   furosemide (LASIX) 20 MG tablet Take 1 tablet (20 mg total) by mouth daily.   INVEGA SUSTENNA 156 MG/ML SUSY injection Inject 156 mg into the muscle every 28 (twenty-eight) days.   levothyroxine (SYNTHROID) 88 MCG tablet Take 1 tablet (88 mcg total) by mouth daily.   loratadine (CLARITIN) 10 MG tablet Take 1 tablet (10 mg total) by mouth daily.   metoprolol succinate (TOPROL-XL) 50 MG 24 hr tablet Take 50 mg by mouth daily.   OLANZapine (ZYPREXA) 5 MG tablet Take 5 mg by mouth at bedtime.   omeprazole (PRILOSEC) 20 MG capsule Take 1 capsule (20 mg total) by mouth daily.   paliperidone (INVEGA) 3 MG 24 hr tablet Take 3 mg by mouth 2 (two) times daily.   polyethylene glycol (MIRALAX / GLYCOLAX) 17 g packet Give 1 packet, 17 g, in water or juice daily for the next 7 days.  After this may use as needed for constipation   prazosin (MINIPRESS) 2 MG capsule prazosin 2  mg capsule  TAKE 1 TO 2 CAPSULES BY MOUTH AT BEDTIME FOR SLEEP   sertraline (ZOLOFT) 50 MG tablet Take 50 mg by mouth daily.   traZODone (DESYREL) 50 MG tablet Take 50 mg by mouth at bedtime.     Allergies:   Chlorpromazine   Social History   Socioeconomic History   Marital status: Single    Spouse name: Not on file   Number of children: Not on file   Years of education: Not on file   Highest education level: Not on file  Occupational History   Not on file  Tobacco Use   Smoking status: Never   Smokeless tobacco: Never  Vaping Use   Vaping Use: Never used  Substance and Sexual Activity   Alcohol use: No   Drug use: No    Sexual activity: Not on file  Other Topics Concern   Not on file  Social History Narrative   Not on file   Social Determinants of Health   Financial Resource Strain: Not on file  Food Insecurity: Not on file  Transportation Needs: Not on file  Physical Activity: Not on file  Stress: Not on file  Social Connections: Not on file     Family History: The patient's family history includes Healthy in her father and mother. There is no history of CVA.  ROS:   Please see the history of present illness.    (+) Chest pain (+) Palpitations (+) Shortness of breath All other systems reviewed and are negative.  EKGs/Labs/Other Studies Reviewed:    The following studies were reviewed today:  Bilateral Carotid Doppler  09/29/2016: FINDINGS: Criteria: Quantification of carotid stenosis is based on velocity parameters that correlate the residual internal carotid diameter with NASCET-based stenosis levels, using the diameter of the distal internal carotid lumen as the denominator for stenosis measurement.   The following velocity measurements were obtained:   RIGHT   ICA:  127/30 cm/sec   CCA:  Q000111Q cm/sec   SYSTOLIC ICA/CCA RATIO:  1.2   DIASTOLIC ICA/CCA RATIO:  1.0   ECA:  85 cm/sec   LEFT   ICA:  106/31 cm/sec   CCA:  0000000 cm/sec   SYSTOLIC ICA/CCA RATIO:  0.8   DIASTOLIC ICA/CCA RATIO:  1.2   ECA:  55 cm/sec   RIGHT CAROTID ARTERY: No significant atherosclerotic plaque or evidence of stenosis.   RIGHT VERTEBRAL ARTERY:  Patent with normal antegrade flow.   LEFT CAROTID ARTERY: No significant atherosclerotic plaque or evidence of stenosis.   LEFT VERTEBRAL ARTERY:  Patent with normal antegrade flow.   IMPRESSION: Negative bilateral carotid duplex ultrasound.   Echo TTE 09/29/2016: Study Conclusions   - Left ventricle: The cavity size was normal. Systolic function was    normal. The estimated ejection fraction was in the range of 60%    to 65%. Left  ventricular diastolic function parameters were    normal.  - Aortic valve: Poorly visualized. Question possible leaflet    thickening. Vegetation cannot be excluded. There was moderate    regurgitation.  - Right ventricle: The cavity size was normal. Systolic function    was normal.  - Pericardium, extracardiac: A trivial pericardial effusion was    identified.   Impressions:   - Poorly visualized aortic valve with moderate aortic    regurgitation. Endocarditis or other valvular lesion cannot be    excluded. TEE could be helpful for further evaluation, as    clinically indicated.    EKG:  EKG is personally reviewed and interpreted. 12/24/2021: Sinus rhythm. Rate 99 bpm. Short PR, nonspecific ST/T wave changes.  Recent Labs: No results found for requested labs within last 8760 hours.   Recent Lipid Panel    Component Value Date/Time   CHOL 139 09/29/2016 0610   TRIG 40 09/29/2016 0610   HDL 52 09/29/2016 0610   CHOLHDL 2.7 09/29/2016 0610   VLDL 8 09/29/2016 0610   LDLCALC 79 09/29/2016 0610     Risk Assessment/Calculations:          Physical Exam:    VS:  BP 110/70 (BP Location: Right Arm, Patient Position: Sitting, Cuff Size: Large)   Pulse 99   Ht 5\' 3"  (1.6 m)   Wt 267 lb (121.1 kg)   BMI 47.30 kg/m     Wt Readings from Last 3 Encounters:  12/24/21 267 lb (121.1 kg)  04/07/21 296 lb (134.3 kg)  05/08/19 280 lb (127 kg)     GEN: Well nourished, well developed in no acute distress HEENT: Normal NECK: No JVD; No carotid bruits LYMPHATICS: No lymphadenopathy CARDIAC: RRR, no murmurs, rubs, gallops RESPIRATORY:  Clear to auscultation without rales, wheezing or rhonchi  ABDOMEN: Soft, non-tender, non-distended MUSCULOSKELETAL:  No edema; No deformity  SKIN: Warm and dry NEUROLOGIC:  Alert and oriented x 3 PSYCHIATRIC:  Normal affect   ASSESSMENT:    1. Chest pain of uncertain etiology   2. Palpitations    PLAN:    In order of problems listed  above:  Atypical chest pain We will go ahead and check an echocardiogram to ensure proper structure and function of her heart.  Her pain is needlelike short fleeting.  Atypical.  EKG overall reassuring.  Palpitations Occasional skips.  Likely PVCs or PACs.  Okay to continue with Toprol-XL 50 mg once a day.  She has had significant weight loss over 100 pounds.       Follow-up: PRN. We will follow up with results of testing.  Medication Adjustments/Labs and Tests Ordered: Current medicines are reviewed at length with the patient today.  Concerns regarding medicines are outlined above.   Orders Placed This Encounter  Procedures   EKG 12-Lead   ECHOCARDIOGRAM COMPLETE   No orders of the defined types were placed in this encounter.  Patient Instructions  Medication Instructions:  The current medical regimen is effective;  continue present plan and medications.  *If you need a refill on your cardiac medications before your next appointment, please call your pharmacy*  Testing/Procedures: Your physician has requested that you have an echocardiogram. Echocardiography is a painless test that uses sound waves to create images of your heart. It provides your doctor with information about the size and shape of your heart and how well your heart's chambers and valves are working. This procedure takes approximately one hour. There are no restrictions for this procedure.  Follow-Up: At [facility], you and your health needs are our priority.  As part of our continuing mission to provide you with exceptional heart care, we have created designated Provider Care Teams.  These Care Teams include your primary Cardiologist (physician) and Advanced Practice Providers (APPs -  Physician Assistants and Nurse Practitioners) who all work together to provide you with the care you need, when you need it.  We recommend signing up for the patient portal called "MyChart".  Sign up information is provided on  this After Visit Summary.  MyChart is used to connect with patients for Virtual Visits (Telemedicine).  Patients  are able to view lab/test results, encounter notes, upcoming appointments, etc.  Non-urgent messages can be sent to your provider as well.   To learn more about what you can do with MyChart, go to [email or web address].    Your next appointment:   Follow up to be determined after the above testing.  Important Information About Sugar         I,Mathew Stumpf,acting as a scribe for Megan Furbish, MD.,have documented all relevant documentation on the behalf of Megan Furbish, MD,as directed by  Megan Furbish, MD while in the presence of Megan Furbish, MD.  I, Megan Furbish, MD, have reviewed all documentation for this visit. The documentation on 12/24/21 for the exam, diagnosis, procedures, and orders are all accurate and complete.   Signed, Megan Furbish, MD  12/24/2021 1:44 PM    [facility]

## 2021-12-24 NOTE — Assessment & Plan Note (Signed)
Occasional skips.  Likely PVCs or PACs.  Okay to continue with Toprol-XL 50 mg once a day.  She has had significant weight loss over 100 pounds.

## 2021-12-24 NOTE — Patient Instructions (Signed)
Medication Instructions:  The current medical regimen is effective;  continue present plan and medications.  *If you need a refill on your cardiac medications before your next appointment, please call your pharmacy*  Testing/Procedures: Your physician has requested that you have an echocardiogram. Echocardiography is a painless test that uses sound waves to create images of your heart. It provides your doctor with information about the size and shape of your heart and how well your heart's chambers and valves are working. This procedure takes approximately one hour. There are no restrictions for this procedure.  Follow-Up: At [facility], you and your health needs are our priority.  As part of our continuing mission to provide you with exceptional heart care, we have created designated Provider Care Teams.  These Care Teams include your primary Cardiologist (physician) and Advanced Practice Providers (APPs -  Physician Assistants and Nurse Practitioners) who all work together to provide you with the care you need, when you need it.  We recommend signing up for the patient portal called "MyChart".  Sign up information is provided on this After Visit Summary.  MyChart is used to connect with patients for Virtual Visits (Telemedicine).  Patients are able to view lab/test results, encounter notes, upcoming appointments, etc.  Non-urgent messages can be sent to your provider as well.   To learn more about what you can do with MyChart, go to [email or web address].    Your next appointment:   Follow up to be determined after the above testing.  Important Information About Sugar

## 2021-12-24 NOTE — Assessment & Plan Note (Signed)
We will go ahead and check an echocardiogram to ensure proper structure and function of her heart.  Her pain is needlelike short fleeting.  Atypical.  EKG overall reassuring.

## 2022-01-13 ENCOUNTER — Ambulatory Visit (HOSPITAL_COMMUNITY): Payer: Medicaid Other

## 2022-01-16 ENCOUNTER — Ambulatory Visit (HOSPITAL_COMMUNITY): Payer: Medicaid Other | Attending: Cardiology

## 2022-01-19 ENCOUNTER — Encounter (HOSPITAL_COMMUNITY): Payer: Self-pay | Admitting: Cardiology

## 2022-01-19 ENCOUNTER — Telehealth (HOSPITAL_COMMUNITY): Payer: Self-pay | Admitting: Cardiology

## 2022-02-09 ENCOUNTER — Ambulatory Visit (HOSPITAL_COMMUNITY): Payer: Medicaid Other | Attending: Internal Medicine

## 2022-02-09 DIAGNOSIS — R079 Chest pain, unspecified: Secondary | ICD-10-CM | POA: Insufficient documentation

## 2022-02-09 LAB — ECHOCARDIOGRAM COMPLETE
Area-P 1/2: 4.86 cm2
P 1/2 time: 524 msec
S' Lateral: 3.1 cm

## 2022-05-06 ENCOUNTER — Ambulatory Visit
Admission: RE | Admit: 2022-05-06 | Discharge: 2022-05-06 | Disposition: A | Discharge status: Home / Self Care | Payer: Medicaid Other | Source: Ambulatory Visit | Attending: Adult Health | Admitting: Adult Health

## 2022-05-06 ENCOUNTER — Other Ambulatory Visit: Payer: Self-pay | Admitting: Adult Health

## 2022-05-06 DIAGNOSIS — R109 Unspecified abdominal pain: Secondary | ICD-10-CM

## 2022-05-29 ENCOUNTER — Ambulatory Visit (HOSPITAL_COMMUNITY)
Admission: EM | Admit: 2022-05-29 | Discharge: 2022-05-30 | Disposition: A | Discharge status: Home / Self Care | Payer: Medicaid Other | Attending: Urology | Admitting: Urology

## 2022-05-29 DIAGNOSIS — Z79899 Other long term (current) drug therapy: Secondary | ICD-10-CM | POA: Insufficient documentation

## 2022-05-29 DIAGNOSIS — Z1152 Encounter for screening for COVID-19: Secondary | ICD-10-CM | POA: Insufficient documentation

## 2022-05-29 DIAGNOSIS — R4689 Other symptoms and signs involving appearance and behavior: Secondary | ICD-10-CM | POA: Insufficient documentation

## 2022-05-30 ENCOUNTER — Other Ambulatory Visit: Payer: Self-pay

## 2022-05-30 DIAGNOSIS — R4589 Other symptoms and signs involving emotional state: Secondary | ICD-10-CM

## 2022-05-30 LAB — POCT URINE DRUG SCREEN - MANUAL ENTRY (I-SCREEN)
POC Amphetamine UR: NOT DETECTED
POC Buprenorphine (BUP): NOT DETECTED
POC Cocaine UR: NOT DETECTED
POC Marijuana UR: NOT DETECTED
POC Methadone UR: NOT DETECTED
POC Methamphetamine UR: NOT DETECTED
POC Morphine: NOT DETECTED
POC Oxazepam (BZO): POSITIVE — AB
POC Oxycodone UR: NOT DETECTED
POC Secobarbital (BAR): NOT DETECTED

## 2022-05-30 LAB — RESP PANEL BY RT-PCR (FLU A&B, COVID) ARPGX2
Influenza A by PCR: NEGATIVE
Influenza B by PCR: NEGATIVE
SARS Coronavirus 2 by RT PCR: NEGATIVE

## 2022-05-30 LAB — POC SARS CORONAVIRUS 2 AG: SARSCOV2ONAVIRUS 2 AG: NEGATIVE

## 2022-05-30 LAB — POCT PREGNANCY, URINE: Preg Test, Ur: NEGATIVE

## 2022-05-30 MED ORDER — SERTRALINE HCL 50 MG PO TABS
50.0000 mg | ORAL_TABLET | Freq: Every day | ORAL | Status: DC
  Administered 2022-05-30: 50 mg via ORAL
  Filled 2022-05-30: qty 1

## 2022-05-30 MED ORDER — POLYETHYLENE GLYCOL 3350 17 G PO PACK: 17.0000 g | PACK | Freq: Two times a day (BID) | ORAL | Status: DC

## 2022-05-30 MED ORDER — PANTOPRAZOLE SODIUM 40 MG PO TBEC
40.0000 mg | DELAYED_RELEASE_TABLET | Freq: Every day | ORAL | Status: DC
  Administered 2022-05-30: 40 mg via ORAL
  Filled 2022-05-30: qty 1

## 2022-05-30 MED ORDER — FUROSEMIDE 40 MG PO TABS: 40.0000 mg | ORAL_TABLET | Freq: Every day | ORAL | 0 refills | Status: DC

## 2022-05-30 MED ORDER — MAGNESIUM HYDROXIDE 400 MG/5ML PO SUSP: 30.0000 mL | Freq: Every day | ORAL | Status: DC | PRN

## 2022-05-30 MED ORDER — POLYETHYLENE GLYCOL 3350 17 G PO PACK
17.0000 g | PACK | Freq: Once | ORAL | Status: AC
  Administered 2022-05-30: 17 g via ORAL
  Filled 2022-05-30: qty 1

## 2022-05-30 MED ORDER — HYDROXYZINE HCL 25 MG PO TABS: 25.0000 mg | ORAL_TABLET | Freq: Three times a day (TID) | ORAL | Status: DC | PRN

## 2022-05-30 MED ORDER — DIVALPROEX SODIUM ER 500 MG PO TB24
500.0000 mg | ORAL_TABLET | Freq: Two times a day (BID) | ORAL | Status: DC
  Administered 2022-05-30: 500 mg via ORAL
  Filled 2022-05-30: qty 1

## 2022-05-30 MED ORDER — POLYETHYLENE GLYCOL 3350 17 G PO PACK
17.0000 g | PACK | Freq: Every day | ORAL | Status: DC
  Administered 2022-05-30: 17 g via ORAL
  Filled 2022-05-30: qty 1

## 2022-05-30 MED ORDER — PRAZOSIN HCL 2 MG PO CAPS: 2.0000 mg | ORAL_CAPSULE | Freq: Every day | ORAL | Status: DC

## 2022-05-30 MED ORDER — ACETAMINOPHEN 325 MG PO TABS
650.0000 mg | ORAL_TABLET | Freq: Four times a day (QID) | ORAL | Status: DC | PRN
  Administered 2022-05-30 (×2): 650 mg via ORAL
  Filled 2022-05-30 (×2): qty 2

## 2022-05-30 MED ORDER — METOPROLOL SUCCINATE ER 50 MG PO TB24
50.0000 mg | ORAL_TABLET | Freq: Every day | ORAL | Status: DC
  Administered 2022-05-30: 50 mg via ORAL
  Filled 2022-05-30: qty 1

## 2022-05-30 MED ORDER — ALUM & MAG HYDROXIDE-SIMETH 200-200-20 MG/5ML PO SUSP: 30.0000 mL | ORAL | Status: DC | PRN

## 2022-05-30 MED ORDER — FUROSEMIDE 40 MG PO TABS
40.0000 mg | ORAL_TABLET | Freq: Every day | ORAL | Status: DC
  Administered 2022-05-30: 40 mg via ORAL
  Filled 2022-05-30: qty 1

## 2022-05-30 MED ORDER — LEVOTHYROXINE SODIUM 88 MCG PO TABS
88.0000 ug | ORAL_TABLET | Freq: Every day | ORAL | Status: DC
  Administered 2022-05-30: 88 ug via ORAL
  Filled 2022-05-30: qty 1

## 2022-05-30 MED ORDER — TRAZODONE HCL 50 MG PO TABS: 50.0000 mg | ORAL_TABLET | Freq: Every day | ORAL | Status: DC

## 2022-05-30 MED ORDER — LORATADINE 10 MG PO TABS
10.0000 mg | ORAL_TABLET | Freq: Every day | ORAL | Status: DC
  Administered 2022-05-30: 10 mg via ORAL
  Filled 2022-05-30: qty 1

## 2022-05-30 MED ORDER — PALIPERIDONE ER 3 MG PO TB24
3.0000 mg | ORAL_TABLET | Freq: Two times a day (BID) | ORAL | Status: DC
  Administered 2022-05-30: 3 mg via ORAL
  Filled 2022-05-30: qty 1

## 2022-05-30 MED ORDER — TRAZODONE HCL 50 MG PO TABS: 50.0000 mg | ORAL_TABLET | Freq: Every evening | ORAL | Status: DC | PRN

## 2022-05-30 MED ORDER — ATORVASTATIN CALCIUM 10 MG PO TABS: 20.0000 mg | ORAL_TABLET | Freq: Every day | ORAL | Status: DC

## 2022-05-30 NOTE — Care Management (Signed)
Care Management   Writer was not able to leave a voice mail message with the the QP Myna Hidalgo at [phone] at the [facility].    Ranae Pila [phone] is the AFL provider through [place] and the voice mail is not set up   The patient's guardianship is through [employer]" [place] [phone].  The after hours number for the guardian is [phone] and I was able to speak to her legal guardian who reports that she is driving back to [facility] and will not be able to call me back to assist until 1pm   Writer provided the guardian with the number to the [place] in order to follow up for a time for her to pick up the patient.

## 2022-05-30 NOTE — ED Notes (Signed)
Attempted blood draw x 2, unsuccessful.  Will notify day shift.

## 2022-05-30 NOTE — ED Notes (Signed)
Pt calm, resting on bed with rise and fall of chest noted. No acute distress at this time. Will continue to monitor.
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
## 2022-05-30 NOTE — Discharge Instructions (Signed)
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
## 2022-06-19 ENCOUNTER — Inpatient Hospital Stay (HOSPITAL_COMMUNITY)
Admission: EM | Admit: 2022-06-19 | Discharge: 2022-06-29 | DRG: 389 | Disposition: A | Discharge status: Home / Self Care | Payer: Medicaid Other | Source: Ambulatory Visit | Attending: Internal Medicine | Admitting: Internal Medicine

## 2022-06-19 ENCOUNTER — Ambulatory Visit
Admission: EM | Admit: 2022-06-19 | Discharge: 2022-06-19 | Disposition: A | Discharge status: Other Acute Inpatient Hospital | Payer: Medicaid Other | Attending: Physician Assistant | Admitting: Physician Assistant

## 2022-06-19 ENCOUNTER — Encounter (HOSPITAL_COMMUNITY): Payer: Self-pay

## 2022-06-19 ENCOUNTER — Other Ambulatory Visit: Payer: Self-pay

## 2022-06-19 ENCOUNTER — Emergency Department (HOSPITAL_COMMUNITY): Payer: Medicaid Other

## 2022-06-19 DIAGNOSIS — R000 Tachycardia, unspecified: Secondary | ICD-10-CM

## 2022-06-19 DIAGNOSIS — R519 Headache, unspecified: Secondary | ICD-10-CM | POA: Diagnosis present

## 2022-06-19 DIAGNOSIS — K56609 Unspecified intestinal obstruction, unspecified as to partial versus complete obstruction: Secondary | ICD-10-CM | POA: Diagnosis not present

## 2022-06-19 DIAGNOSIS — R112 Nausea with vomiting, unspecified: Secondary | ICD-10-CM | POA: Diagnosis not present

## 2022-06-19 DIAGNOSIS — E119 Type 2 diabetes mellitus without complications: Secondary | ICD-10-CM

## 2022-06-19 DIAGNOSIS — I890 Lymphedema, not elsewhere classified: Secondary | ICD-10-CM | POA: Diagnosis present

## 2022-06-19 DIAGNOSIS — Z6841 Body Mass Index (BMI) 40.0 and over, adult: Secondary | ICD-10-CM

## 2022-06-19 DIAGNOSIS — F4320 Adjustment disorder, unspecified: Secondary | ICD-10-CM | POA: Diagnosis present

## 2022-06-19 DIAGNOSIS — D649 Anemia, unspecified: Secondary | ICD-10-CM | POA: Diagnosis present

## 2022-06-19 DIAGNOSIS — K5669 Other partial intestinal obstruction: Principal | ICD-10-CM | POA: Diagnosis present

## 2022-06-19 DIAGNOSIS — R6250 Unspecified lack of expected normal physiological development in childhood: Secondary | ICD-10-CM | POA: Diagnosis present

## 2022-06-19 DIAGNOSIS — R197 Diarrhea, unspecified: Secondary | ICD-10-CM

## 2022-06-19 DIAGNOSIS — Z79899 Other long term (current) drug therapy: Secondary | ICD-10-CM

## 2022-06-19 DIAGNOSIS — K5909 Other constipation: Secondary | ICD-10-CM | POA: Diagnosis present

## 2022-06-19 DIAGNOSIS — K621 Rectal polyp: Secondary | ICD-10-CM | POA: Diagnosis present

## 2022-06-19 DIAGNOSIS — F319 Bipolar disorder, unspecified: Secondary | ICD-10-CM | POA: Diagnosis present

## 2022-06-19 DIAGNOSIS — Z888 Allergy status to other drugs, medicaments and biological substances status: Secondary | ICD-10-CM

## 2022-06-19 DIAGNOSIS — E039 Hypothyroidism, unspecified: Secondary | ICD-10-CM | POA: Insufficient documentation

## 2022-06-19 DIAGNOSIS — Z79890 Hormone replacement therapy (postmenopausal): Secondary | ICD-10-CM

## 2022-06-19 DIAGNOSIS — R42 Dizziness and giddiness: Secondary | ICD-10-CM | POA: Diagnosis present

## 2022-06-19 DIAGNOSIS — K640 First degree hemorrhoids: Secondary | ICD-10-CM | POA: Diagnosis present

## 2022-06-19 DIAGNOSIS — F79 Unspecified intellectual disabilities: Secondary | ICD-10-CM

## 2022-06-19 DIAGNOSIS — F209 Schizophrenia, unspecified: Secondary | ICD-10-CM | POA: Diagnosis present

## 2022-06-19 DIAGNOSIS — T189XXS Foreign body of alimentary tract, part unspecified, sequela: Secondary | ICD-10-CM

## 2022-06-19 DIAGNOSIS — I10 Essential (primary) hypertension: Secondary | ICD-10-CM | POA: Diagnosis present

## 2022-06-19 DIAGNOSIS — E785 Hyperlipidemia, unspecified: Secondary | ICD-10-CM | POA: Diagnosis present

## 2022-06-19 DIAGNOSIS — R0602 Shortness of breath: Secondary | ICD-10-CM | POA: Diagnosis present

## 2022-06-19 DIAGNOSIS — F603 Borderline personality disorder: Secondary | ICD-10-CM | POA: Diagnosis present

## 2022-06-19 DIAGNOSIS — K219 Gastro-esophageal reflux disease without esophagitis: Secondary | ICD-10-CM | POA: Diagnosis present

## 2022-06-19 DIAGNOSIS — N179 Acute kidney failure, unspecified: Secondary | ICD-10-CM | POA: Diagnosis present

## 2022-06-19 DIAGNOSIS — W449XXS Unspecified foreign body entering into or through a natural orifice, sequela: Secondary | ICD-10-CM

## 2022-06-19 LAB — LIPASE, BLOOD: Lipase: 41 U/L (ref 11–51)

## 2022-06-19 LAB — COMPREHENSIVE METABOLIC PANEL
ALT: 11 U/L (ref 0–44)
AST: 20 U/L (ref 15–41)
Albumin: 3.9 g/dL (ref 3.5–5.0)
Alkaline Phosphatase: 45 U/L (ref 38–126)
Anion gap: 18 — ABNORMAL HIGH (ref 5–15)
BUN: 29 mg/dL — ABNORMAL HIGH (ref 6–20)
CO2: 25 mmol/L (ref 22–32)
Calcium: 9.5 mg/dL (ref 8.9–10.3)
Chloride: 97 mmol/L — ABNORMAL LOW (ref 98–111)
Creatinine, Ser: 1.42 mg/dL — ABNORMAL HIGH (ref 0.44–1.00)
GFR, Estimated: 47 mL/min — ABNORMAL LOW (ref >60)
Glucose, Bld: 132 mg/dL — ABNORMAL HIGH (ref 70–99)
Potassium: 4.5 mmol/L (ref 3.5–5.1)
Sodium: 140 mmol/L (ref 135–145)
Total Bilirubin: 0.8 mg/dL (ref 0.3–1.2)
Total Protein: 7.1 g/dL (ref 6.5–8.1)

## 2022-06-19 LAB — I-STAT CHEM 8, ED
BUN: 30 mg/dL — ABNORMAL HIGH (ref 6–20)
Calcium, Ion: 1.05 mmol/L — ABNORMAL LOW (ref 1.15–1.40)
Chloride: 101 mmol/L (ref 98–111)
Creatinine, Ser: 1.4 mg/dL — ABNORMAL HIGH (ref 0.44–1.00)
Glucose, Bld: 131 mg/dL — ABNORMAL HIGH (ref 70–99)
HCT: 45 % (ref 36.0–46.0)
Hemoglobin: 15.3 g/dL — ABNORMAL HIGH (ref 12.0–15.0)
Potassium: 4.4 mmol/L (ref 3.5–5.1)
Sodium: 136 mmol/L (ref 135–145)
TCO2: 26 mmol/L (ref 22–32)

## 2022-06-19 LAB — CBC WITH DIFFERENTIAL/PLATELET
Abs Immature Granulocytes: 0.05 10*3/uL (ref 0.00–0.07)
Basophils Absolute: 0 10*3/uL (ref 0.0–0.1)
Basophils Relative: 0 %
Eosinophils Absolute: 0 10*3/uL (ref 0.0–0.5)
Eosinophils Relative: 0 %
HCT: 44 % (ref 36.0–46.0)
Hemoglobin: 14.4 g/dL (ref 12.0–15.0)
Immature Granulocytes: 1 %
Lymphocytes Relative: 22 %
Lymphs Abs: 2.1 10*3/uL (ref 0.7–4.0)
MCH: 28.6 pg (ref 26.0–34.0)
MCHC: 32.7 g/dL (ref 30.0–36.0)
MCV: 87.3 fL (ref 80.0–100.0)
Monocytes Absolute: 0.8 10*3/uL (ref 0.1–1.0)
Monocytes Relative: 9 %
Neutro Abs: 6.4 10*3/uL (ref 1.7–7.7)
Neutrophils Relative %: 68 %
Platelets: 297 10*3/uL (ref 150–400)
RBC: 5.04 MIL/uL (ref 3.87–5.11)
RDW: 12.8 % (ref 11.5–15.5)
WBC: 9.4 10*3/uL (ref 4.0–10.5)
nRBC: 0 % (ref 0.0–0.2)

## 2022-06-19 LAB — I-STAT BETA HCG BLOOD, ED (MC, WL, AP ONLY): I-stat hCG, quantitative: <5 m[IU]/mL (ref <5)

## 2022-06-19 MED ORDER — METOCLOPRAMIDE HCL 5 MG/ML IJ SOLN
10.0000 mg | Freq: Once | INTRAMUSCULAR | Status: AC
  Administered 2022-06-19: 10 mg via INTRAVENOUS
  Filled 2022-06-19: qty 2

## 2022-06-19 MED ORDER — SODIUM CHLORIDE 0.9 % IV BOLUS
1000.0000 mL | Freq: Once | INTRAVENOUS | Status: AC
  Administered 2022-06-19: 1000 mL via INTRAVENOUS

## 2022-06-19 MED ORDER — DIPHENHYDRAMINE HCL 50 MG/ML IJ SOLN
50.0000 mg | Freq: Once | INTRAMUSCULAR | Status: AC
  Administered 2022-06-19: 50 mg via INTRAVENOUS
  Filled 2022-06-19: qty 1

## 2022-06-19 MED ORDER — ONDANSETRON HCL 4 MG/2ML IJ SOLN
4.0000 mg | Freq: Once | INTRAMUSCULAR | Status: AC
  Administered 2022-06-19: 4 mg via INTRAVENOUS
  Filled 2022-06-19: qty 2

## 2022-06-19 MED ORDER — IOHEXOL 350 MG/ML SOLN
100.0000 mL | Freq: Once | INTRAVENOUS | Status: AC | PRN
  Administered 2022-06-19: 100 mL via INTRAVENOUS

## 2022-06-19 MED ORDER — HYDROMORPHONE HCL 1 MG/ML IJ SOLN
1.0000 mg | Freq: Once | INTRAMUSCULAR | Status: AC
  Administered 2022-06-19: 1 mg via INTRAVENOUS
  Filled 2022-06-19: qty 1

## 2022-06-19 MED ORDER — DROPERIDOL 2.5 MG/ML IJ SOLN
2.5000 mg | Freq: Once | INTRAMUSCULAR | Status: AC
  Administered 2022-06-19: 2.5 mg via INTRAVENOUS
  Filled 2022-06-19: qty 2

## 2022-06-19 MED ORDER — MORPHINE SULFATE (PF) 4 MG/ML IV SOLN
4.0000 mg | Freq: Once | INTRAVENOUS | Status: AC
  Administered 2022-06-19: 4 mg via INTRAVENOUS
  Filled 2022-06-19: qty 1

## 2022-06-19 NOTE — ED Notes (Signed)
Attempted NG tube placement at this time. Attempted x2 without success. Patient unable to tolerate at this time.

## 2022-06-19 NOTE — H&P (Signed)
History and Physical    Megan Manning ALP:[id number] DOB: Nov 30, 1980 DOA: 06/19/2022  PCP: Pcp, No  Patient coming from:  Urgent care  I have personally briefly reviewed patient's old medical records in [facility]  Chief Complaint:  n/v abdominal pain x 2 days  HPI: Megan Manning is a 41 y.o. female with medical history significant of  borderline personality d/o , DMII, HTN ,hyperthyroidism, obesity , PICA who per chart had recent change in medication s/p which patient began to experience constipation and abdominal pain x 4 days and n/v x 2 days.  Patient was seen at [facility] noted to have tachycardia , continued n/v and dizziness and was sent to ED.  Patient currently in ED states symptoms somewhat improved. Patient noted no fever/ chills/ chest pain/ sob/ recent uri or cough. Patient however does not dysuria and foul smelling urine.   ED Course:  Afeb, bp 150/102, hr 138  sat 94% on ra Labs Wbc 9.4, , hgb 14.4, plt 297  Na 140, K 4.5, cr 1.42 (0.57)  CT abd: Dilated loops of mid small bowel with fecalization of bowel contents and wall edema consistent with a mild partial small bowel obstruction. Focal transition zone is noted deep within the pelvis although no mass lesion is seen. Review of Systems: As per HPI otherwise 10 point review of systems negative.   Past Medical History:  Diagnosis Date   Adjustment disorder    Borderline personality disorder (HCC)    Diabetes mellitus without complication (HCC)    Hypertension    Hyperthyroidism    Intellectual disability    Lactic acidosis    Lymphedema    Obesity    Overdose    Pica in adults    Thyroid disease     Past Surgical History:  Procedure Laterality Date   COLONOSCOPY WITH PROPOFOL N/A 12/22/2016   Procedure: COLONOSCOPY WITH PROPOFOL;  Surgeon: Wyline Mood, MD;  Location: [facility] ENDOSCOPY;  Service: Endoscopy;  Laterality: N/A;   NO PAST SURGERIES       reports that she has never smoked. She has never used  smokeless tobacco. She reports that she does not drink alcohol and does not use drugs.  Allergies  Allergen Reactions   Chlorpromazine Other (See Comments)    Caused cornea problems    Family History  Problem Relation Age of Onset   Healthy Mother    Healthy Father    CVA Neg Hx     Prior to Admission medications   Medication Sig Start Date End Date Taking? Authorizing Provider  acetaminophen (TYLENOL) 500 MG tablet Take 1,000 mg by mouth every 6 (six) hours as needed.    [provider]  albuterol (VENTOLIN HFA) 108 (90 Base) MCG/ACT inhaler Inhale 2 puffs into the lungs every 6 (six) hours as needed for wheezing or shortness of breath.  07/13/19   [provider]  atorvastatin (LIPITOR) 20 MG tablet Take 20 mg by mouth at bedtime. 03/09/21   [provider]  divalproex (DEPAKOTE ER) 500 MG 24 hr tablet Take 500 mg by mouth 2 (two) times daily.    [provider]  furosemide (LASIX) 40 MG tablet Take 60 mg by mouth daily.    [provider]  furosemide (LASIX) 40 MG tablet Take 1 tablet (40 mg total) by mouth daily. 05/31/22   Oneta Rack, NP  haloperidol (HALDOL) 5 MG tablet Take 5 mg by mouth 2 (two) times daily.    [provider]  INVEGA SUSTENNA 234 MG/1.5ML injection Inject 234 mg into the muscle every 28 (twenty-eight) days.    [provider]  levothyroxine (SYNTHROID) 88 MCG tablet Take 1 tablet (88 mcg total) by mouth daily. 11/17/18   Merrilee Jansky, MD  loratadine (CLARITIN) 10 MG tablet Take 1 tablet (10 mg total) by mouth daily. 03/16/17   Clapacs, Jackquline Denmark, MD  LORazepam (ATIVAN) 1 MG tablet Take 1 mg by mouth every 8 (eight) hours as needed.    [provider]  medroxyPROGESTERone (DEPO-PROVERA) 150 MG/ML injection Inject 150 mg into the muscle every 3 (three) months. 05/04/22   [provider]  metoprolol succinate (TOPROL-XL) 50 MG 24 hr tablet Take 50 mg by mouth daily.    [provider]  montelukast (SINGULAIR) 10 MG tablet Take 10 mg by mouth at bedtime.    [provider]  nystatin (MYCOSTATIN/NYSTOP) powder Apply 1 Application topically 2 (two) times daily as needed.    [provider]  Olopatadine HCl 0.2 % SOLN Place 1 drop into both eyes daily as needed.    [provider]  omeprazole (PRILOSEC) 20 MG capsule Take 1 capsule (20 mg total) by mouth daily. 03/16/17   Clapacs, Jackquline Denmark, MD  potassium chloride SA (KLOR-CON M) 20 MEQ tablet Take 20 mEq by mouth daily.    [provider]  prazosin (MINIPRESS) 2 MG capsule prazosin 2 mg capsule  TAKE 1 TO 2 CAPSULES BY MOUTH AT BEDTIME FOR SLEEP    [provider]  sertraline (ZOLOFT) 50 MG tablet Take 50 mg by mouth daily. 12/06/19   [provider]  traZODone (DESYREL) 100 MG tablet Take 200 mg by mouth at bedtime.    [provider]  Vitamin D, Ergocalciferol, (DRISDOL) 1.25 MG (50000 UNIT) CAPS capsule Take 50,000 Units by mouth every 7 (seven) days. 05/27/22   [provider]    Physical Exam: Vitals:   06/19/22 2130 06/19/22 2300 06/19/22 2302 06/19/22 2330  BP: (!) 139/127 (!) 155/96  (!) 143/90  Pulse: (!) 131  (!) 138 (!) 131  Resp: 20 17  (!) 22  Temp:      TempSrc:      SpO2: 94% 92%  94%  Weight:      Height:        Constitutional: NAD, calm, comfortable Vitals:   06/19/22 2130 06/19/22 2300 06/19/22 2302 06/19/22 2330  BP: (!) 139/127 (!) 155/96  (!) 143/90  Pulse: (!) 131  (!) 138 (!) 131  Resp: 20 17  (!) 22  Temp:      TempSrc:      SpO2: 94% 92%  94%  Weight:      Height:       Eyes: PERRL, lids and conjunctivae normal ENMT: Mucous membranes are dry. Posterior pharynx clear of any exudate or lesions. Neck: normal, supple, no masses, no thyromegaly Respiratory: clear to auscultation bilaterally, no wheezing, no crackles. Normal respiratory effort. No accessory muscle use.  Cardiovascular: Regular rate and rhythm,  no murmurs / rubs / gallops. + extremity edema. 2+ pedal pulses. N Abdomen: + tenderness diffuse, no masses palpated. No hepatosplenomegaly. Bowel sounds positive.  Musculoskeletal: no clubbing / cyanosis. No joint deformity upper and lower extremities. Good ROM, no contractures. Normal muscle tone.  Skin: no rashes, lesions, ulcers. No induration Neurologic: CN 2-12 grossly intact. Sensation intact, DTR normal. Strength 5/5 in all 4.  Psychiatric: Normal judgment and insight. Alert and oriented  x 3. Normal mood.    Labs on Admission: I have personally reviewed following labs and imaging studies  CBC: Recent Labs  Lab 06/19/22 1954 06/19/22 2004  WBC 9.4  --   NEUTROABS 6.4  --   HGB 14.4 15.3*  HCT 44.0 45.0  MCV 87.3  --   PLT 297  --    Basic Metabolic Panel: Recent Labs  Lab 06/19/22 1954 06/19/22 2004  NA 140 136  K 4.5 4.4  CL 97* 101  CO2 25  --   GLUCOSE 132* 131*  BUN 29* 30*  CREATININE 1.42* 1.40*  CALCIUM 9.5  --    GFR: Estimated Creatinine Clearance: 60.5 mL/min (A) (by C-G formula based on SCr of 1.4 mg/dL (H)). Liver Function Tests: Recent Labs  Lab 06/19/22 1954  AST 20  ALT 11  ALKPHOS 45  BILITOT 0.8  PROT 7.1  ALBUMIN 3.9   Recent Labs  Lab 06/19/22 1954  LIPASE 41   No results for input(s): "AMMONIA" in the last 168 hours. Coagulation Profile: No results for input(s): "INR", "PROTIME" in the last 168 hours. Cardiac Enzymes: No results for input(s): "CKTOTAL", "CKMB", "CKMBINDEX", "TROPONINI" in the last 168 hours. BNP (last 3 results) No results for input(s): "PROBNP" in the last 8760 hours. HbA1C: No results for input(s): "HGBA1C" in the last 72 hours. CBG: No results for input(s): "GLUCAP" in the last 168 hours. Lipid Profile: No results for input(s): "CHOL", "HDL", "LDLCALC", "TRIG", "CHOLHDL", "LDLDIRECT" in the last 72 hours. Thyroid Function Tests: No results for input(s): "TSH", "T4TOTAL", "FREET4", "T3FREE", "THYROIDAB"  in the last 72 hours. Anemia Panel: No results for input(s): "VITAMINB12", "FOLATE", "FERRITIN", "TIBC", "IRON", "RETICCTPCT" in the last 72 hours. Urine analysis:    Component Value Date/Time   COLORURINE AMBER (A) 03/13/2017 1734   APPEARANCEUR CLOUDY (A) 03/13/2017 1734   LABSPEC 1.028 03/13/2017 1734   PHURINE 6.0 03/13/2017 1734   GLUCOSEU NEGATIVE 03/13/2017 1734   HGBUR SMALL (A) 03/13/2017 1734   BILIRUBINUR NEGATIVE 03/13/2017 1734   KETONESUR NEGATIVE 03/13/2017 1734   PROTEINUR 30 (A) 03/13/2017 1734   UROBILINOGEN 1.0 08/11/2009 1818   NITRITE NEGATIVE 03/13/2017 1734   LEUKOCYTESUR LARGE (A) 03/13/2017 1734    Radiological Exams on Admission: CT ABDOMEN PELVIS W CONTRAST  Result Date: 06/19/2022 CLINICAL DATA:  Vomiting and fatigue for 2 days, initial encounter EXAM: CT ANGIOGRAPHY CHEST CT ABDOMEN AND PELVIS WITH CONTRAST TECHNIQUE: Multidetector CT imaging of the chest was performed using the standard protocol during bolus administration of intravenous contrast. Multiplanar CT image reconstructions and MIPs were obtained to evaluate the vascular anatomy. Multidetector CT imaging of the abdomen and pelvis was performed using the standard protocol during bolus administration of intravenous contrast. RADIATION DOSE REDUCTION: This exam was performed according to the departmental dose-optimization program which includes automated exposure control, adjustment of the mA and/or kV according to patient size and/or use of iterative reconstruction technique. CONTRAST:  OMNIPAQUE IOHEXOL 350 MG/ML SOLN COMPARISON:  None Available. FINDINGS: CTA CHEST FINDINGS Cardiovascular: Thoracic aorta and its branches are within normal limits. No cardiac enlargement is seen. The pulmonary artery shows a normal branching pattern without intraluminal filling defect to suggest pulmonary embolism. Mediastinum/Nodes: Thoracic inlet is within normal limits. No hilar or mediastinal adenopathy is  noted. The esophagus as visualized is within normal limits. Lungs/Pleura: Mild bibasilar atelectasis is noted. No sizable effusion or focal confluent infiltrate is seen. Musculoskeletal: No chest wall abnormality. No acute or significant osseous findings. Review  of the MIP images confirms the above findings. CT ABDOMEN and PELVIS FINDINGS Hepatobiliary: No focal liver abnormality is seen. No gallstones, gallbladder wall thickening, or biliary dilatation. Pancreas: Unremarkable. No pancreatic ductal dilatation or surrounding inflammatory changes. Spleen: Normal in size without focal abnormality. Adrenals/Urinary Tract: Adrenal glands are within normal limits. Kidneys demonstrate a normal enhancement pattern. No renal calculi or obstructive changes are seen. The bladder is decompressed. Stomach/Bowel: Appendix is within normal limits. No obstructive or inflammatory changes of the colon are seen. Stomach is decompressed. There are several mildly dilated loops of small bowel identified in the mid abdomen which appear to involve the distal jejunum and proximal ileum. Some inflammatory wall thickening is seen as well as a focal transition zone deep in the pelvis on image number 88 of series 3. Fecalization of bowel contents is noted proximal to this transition zone consistent with a partial small bowel obstruction. Vascular/Lymphatic: No significant vascular findings are present. No enlarged abdominal or pelvic lymph nodes. Reproductive: Uterus and bilateral adnexa are unremarkable. Other: Mild free fluid is noted within the pelvis and surrounding the spleen likely reactive in nature to the changes in the small bowel. No free air is seen. No hernia is noted. Musculoskeletal: Mild degenerative changes of the lumbar spine are noted. Review of the MIP images confirms the above findings. IMPRESSION: Dilated loops of mid small bowel with fecalization of bowel contents and wall edema consistent with a mild partial small bowel  obstruction. Focal transition zone is noted deep within the pelvis although no mass lesion is seen. Electronically Signed   By: Alcide Clever M.D.   On: 06/19/2022 22:40   CT Angio Chest PE W and/or Wo Contrast  Result Date: 06/19/2022 CLINICAL DATA:  Vomiting and fatigue for 2 days, initial encounter EXAM: CT ANGIOGRAPHY CHEST CT ABDOMEN AND PELVIS WITH CONTRAST TECHNIQUE: Multidetector CT imaging of the chest was performed using the standard protocol during bolus administration of intravenous contrast. Multiplanar CT image reconstructions and MIPs were obtained to evaluate the vascular anatomy. Multidetector CT imaging of the abdomen and pelvis was performed using the standard protocol during bolus administration of intravenous contrast. RADIATION DOSE REDUCTION: This exam was performed according to the departmental dose-optimization program which includes automated exposure control, adjustment of the mA and/or kV according to patient size and/or use of iterative reconstruction technique. CONTRAST:  OMNIPAQUE IOHEXOL 350 MG/ML SOLN COMPARISON:  None Available. FINDINGS: CTA CHEST FINDINGS Cardiovascular: Thoracic aorta and its branches are within normal limits. No cardiac enlargement is seen. The pulmonary artery shows a normal branching pattern without intraluminal filling defect to suggest pulmonary embolism. Mediastinum/Nodes: Thoracic inlet is within normal limits. No hilar or mediastinal adenopathy is noted. The esophagus as visualized is within normal limits. Lungs/Pleura: Mild bibasilar atelectasis is noted. No sizable effusion or focal confluent infiltrate is seen. Musculoskeletal: No chest wall abnormality. No acute or significant osseous findings. Review of the MIP images confirms the above findings. CT ABDOMEN and PELVIS FINDINGS Hepatobiliary: No focal liver abnormality is seen. No gallstones, gallbladder wall thickening, or biliary dilatation. Pancreas: Unremarkable. No pancreatic ductal  dilatation or surrounding inflammatory changes. Spleen: Normal in size without focal abnormality. Adrenals/Urinary Tract: Adrenal glands are within normal limits. Kidneys demonstrate a normal enhancement pattern. No renal calculi or obstructive changes are seen. The bladder is decompressed. Stomach/Bowel: Appendix is within normal limits. No obstructive or inflammatory changes of the colon are seen. Stomach is decompressed. There are several mildly dilated loops of small bowel identified  in the mid abdomen which appear to involve the distal jejunum and proximal ileum. Some inflammatory wall thickening is seen as well as a focal transition zone deep in the pelvis on image number 88 of series 3. Fecalization of bowel contents is noted proximal to this transition zone consistent with a partial small bowel obstruction. Vascular/Lymphatic: No significant vascular findings are present. No enlarged abdominal or pelvic lymph nodes. Reproductive: Uterus and bilateral adnexa are unremarkable. Other: Mild free fluid is noted within the pelvis and surrounding the spleen likely reactive in nature to the changes in the small bowel. No free air is seen. No hernia is noted. Musculoskeletal: Mild degenerative changes of the lumbar spine are noted. Review of the MIP images confirms the above findings. IMPRESSION: Dilated loops of mid small bowel with fecalization of bowel contents and wall edema consistent with a mild partial small bowel obstruction. Focal transition zone is noted deep within the pelvis although no mass lesion is seen. Electronically Signed   By: Alcide CleverMark  Lukens M.D.   On: 06/19/2022 22:40    EKG: Independently reviewed. See above   Assessment/Plan  pSBO -admit to progressive care  -continue npo status  -ivfs -repeat KUB this am  -f/u with surgery final rec    AKI -due to poor po in setting of lasix use  -hold nephrotoxic medications  - ivfs  -monitor labs   DMII -place on iss/fs   HTN  - not  currently on medication other than lasix  Lymphedema -on lasix, will hold for now due to aki  HLD  -hold statin currently , resume one patient tolerating po   hypothyrodism -resume as able    Psych d/o  Bipolar d/o  Anxeity  -resume home regimen    Hx of  PICA  DVT prophylaxis: heparin Code Status: full Family Communication: none at bedside Disposition Plan: patient  expected to be admitted greater than 2 midnights  Consults called: n/a Admission status: med tele   Lurline DelSara-Maiz A Angila Wombles MD [facility]   If 7PM-7AM, please contact night-coverage [email or web address] Password [email or web address]  06/19/2022, 11:48 PM

## 2022-06-19 NOTE — ED Notes (Signed)
Patient is being discharged from the Urgent Care and sent to the Emergency Department via [facility] . Per Provider Erma Pinto, patient is in need of higher level of care due to tachycardia, vomiting & dizziness. Patient is aware and verbalizes understanding of plan of care.  Vitals:   06/19/22 1811 06/19/22 1815  BP:  (!) 163/116  Pulse: (!) 146   Resp:    Temp:  98 F (36.7 C)  SpO2: 91%

## 2022-06-19 NOTE — ED Triage Notes (Addendum)
Pt bib [facility] from urgent care reporting uncontrolled n/v for 3 days. HR 140s, BP 80/50. BP upon arrival 150/102

## 2022-06-19 NOTE — ED Notes (Signed)
Unable to obtain piv attempted 2 times in bilateral ACs

## 2022-06-19 NOTE — ED Triage Notes (Addendum)
Pt presents with vomiting & intermittent dizziness with some fatigue for X 2 days after a medication change.

## 2022-06-19 NOTE — ED Provider Notes (Signed)
[facility] EMERGENCY DEPARTMENT Provider Note   CSN: [id number] Arrival date & time: 06/19/22  1923     History  Chief Complaint  Patient presents with   Abdominal Pain    C/o abd pain, with N/V, dizziness, with EMS had hypotension and is currently tachy in the 140s    Megan Manning is a 41 y.o. female history of borderline personality disorder, diabetes, hypertension, hypothyroidism, here presenting with abdominal pain and vomiting.  Patient recently had her Lasix dose decreased.  Patient has been vomiting for the last 2 to 3 days.  Patient is unable to keep anything down.  Has abdominal pain as well.  Denies any shortness of breath.  Patient went to urgent care was noted to be tachycardic with heart rate in the 130s.  Patient was sent here to the ER for further evaluation.  The history is provided by the patient.       Home Medications Prior to Admission medications   Medication Sig Start Date End Date Taking? Authorizing Provider  acetaminophen (TYLENOL) 500 MG tablet Take 1,000 mg by mouth every 6 (six) hours as needed.    [provider]  albuterol (VENTOLIN HFA) 108 (90 Base) MCG/ACT inhaler Inhale 2 puffs into the lungs every 6 (six) hours as needed for wheezing or shortness of breath.  07/13/19   [provider]  atorvastatin (LIPITOR) 20 MG tablet Take 20 mg by mouth at bedtime. 03/09/21   [provider]  divalproex (DEPAKOTE ER) 500 MG 24 hr tablet Take 500 mg by mouth 2 (two) times daily.    [provider]  furosemide (LASIX) 40 MG tablet Take 60 mg by mouth daily.    [provider]  furosemide (LASIX) 40 MG tablet Take 1 tablet (40 mg total) by mouth daily. 05/31/22   Oneta Rack, NP  haloperidol (HALDOL) 5 MG tablet Take 5 mg by mouth 2 (two) times daily.    [provider]  INVEGA SUSTENNA 234 MG/1.5ML injection Inject 234 mg into the muscle every 28 (twenty-eight) days.    [provider]  levothyroxine (SYNTHROID) 88 MCG tablet Take 1 tablet (88 mcg total) by mouth daily. 11/17/18   Merrilee Jansky, MD  loratadine (CLARITIN) 10 MG tablet Take 1 tablet (10 mg total) by mouth daily. 03/16/17   Clapacs, Jackquline Denmark, MD  LORazepam (ATIVAN) 1 MG tablet Take 1 mg by mouth every 8 (eight) hours as needed.    [provider]  medroxyPROGESTERone (DEPO-PROVERA) 150 MG/ML injection Inject 150 mg into the muscle every 3 (three) months. 05/04/22   [provider]  metoprolol succinate (TOPROL-XL) 50 MG 24 hr tablet Take 50 mg by mouth daily.    [provider]  montelukast (SINGULAIR) 10 MG tablet Take 10 mg by mouth at bedtime.    [provider]  nystatin (MYCOSTATIN/NYSTOP) powder Apply 1 Application topically 2 (two) times daily as needed.    [provider]  Olopatadine HCl 0.2 % SOLN Place 1 drop into both eyes daily as needed.    [provider]  omeprazole (PRILOSEC) 20 MG capsule Take 1 capsule (20 mg total) by mouth daily. 03/16/17   Clapacs, Jackquline Denmark, MD  potassium chloride SA (KLOR-CON M) 20 MEQ tablet Take 20 mEq by mouth daily.    [provider]  prazosin (MINIPRESS) 2 MG capsule prazosin 2 mg capsule  TAKE 1 TO 2 CAPSULES BY MOUTH AT BEDTIME FOR SLEEP  [provider]  sertraline (ZOLOFT) 50 MG tablet Take 50 mg by mouth daily. 12/06/19   [provider]  traZODone (DESYREL) 100 MG tablet Take 200 mg by mouth at bedtime.    [provider]  Vitamin D, Ergocalciferol, (DRISDOL) 1.25 MG (50000 UNIT) CAPS capsule Take 50,000 Units by mouth every 7 (seven) days. 05/27/22   [provider]      Allergies    Chlorpromazine    Review of Systems   Review of Systems  Gastrointestinal:  Positive for abdominal pain and vomiting.  All other systems reviewed and are negative.   Physical Exam Updated Vital Signs BP (!) 139/127   Pulse (!) 131   Temp 98.2 F (36.8 C) (Oral)    Resp 20   Ht 5\' 3"  (1.6 m)   Wt 104.3 kg   SpO2 94%   BMI 40.74 kg/m  Physical Exam Vitals and nursing note reviewed.  Constitutional:      Comments: Dehydrated and vomiting and uncomfortable  HENT:     Head: Normocephalic.     Mouth/Throat:     Pharynx: Oropharynx is clear.  Eyes:     Extraocular Movements: Extraocular movements intact.     Pupils: Pupils are equal, round, and reactive to light.  Cardiovascular:     Rate and Rhythm: Regular rhythm. Tachycardia present.     Heart sounds: Normal heart sounds.  Pulmonary:     Effort: Pulmonary effort is normal.  Abdominal:     Comments: + epigastric tenderness   Skin:    General: Skin is warm.     Capillary Refill: Capillary refill takes less than 2 seconds.  Neurological:     General: No focal deficit present.     Mental Status: Megan Manning is oriented to person, place, and time.  Psychiatric:        Mood and Affect: Mood normal.        Behavior: Behavior normal.     ED Results / Procedures / Treatments   Labs (all labs ordered are listed, but only abnormal results are displayed) Labs Reviewed  COMPREHENSIVE METABOLIC PANEL - Abnormal; Notable for the following components:      Result Value   Chloride 97 (*)    Glucose, Bld 132 (*)    BUN 29 (*)    Creatinine, Ser 1.42 (*)    GFR, Estimated 47 (*)    Anion gap 18 (*)    All other components within normal limits  I-STAT CHEM 8, ED - Abnormal; Notable for the following components:   BUN 30 (*)    Creatinine, Ser 1.40 (*)    Glucose, Bld 131 (*)    Calcium, Ion 1.05 (*)    Hemoglobin 15.3 (*)    All other components within normal limits  CBC WITH DIFFERENTIAL/PLATELET  LIPASE, BLOOD  TSH  I-STAT BETA HCG BLOOD, ED ([facility], [facility], [facility] ONLY)    EKG EKG Interpretation  Date/Time:  Friday June 19 2022 20:03:14 EST Ventricular Rate:  131 PR Interval:  101 QRS Duration: 86 QT Interval:  277 QTC Calculation: 409 R Axis:   -17 Text Interpretation: Sinus tachycardia  Probable left atrial enlargement Borderline left axis deviation Borderline T wave abnormalities Artifact in lead(s) II III aVL aVF V1 V2 V3 V4 V5 V6 Confirmed by [id number] [phone]) on 06/19/2022 10:12:28 PM  Radiology No results found.  Procedures Procedures    Angiocath insertion Performed by: 06/21/2022  Consent: Verbal consent obtained. Risks  and benefits: risks, benefits and alternatives were discussed Time out: Immediately prior to procedure a "time out" was called to verify the correct patient, procedure, equipment, support staff and site/side marked as required.  Preparation: Patient was prepped and draped in the usual sterile fashion.  Vein Location: R antecube  Ultrasound Guided  Gauge: 20 long   Normal blood return and flush without difficulty Patient tolerance: Patient tolerated the procedure well with no immediate complications.    Medications Ordered in ED Medications  sodium chloride 0.9 % bolus 1,000 mL (0 mLs Intravenous Stopped 06/19/22 2138)  ondansetron (ZOFRAN) injection 4 mg (4 mg Intravenous Given 06/19/22 2000)  droperidol (INAPSINE) 2.5 MG/ML injection 2.5 mg (2.5 mg Intravenous Given 06/19/22 2001)  morphine (PF) 4 MG/ML injection 4 mg (4 mg Intravenous Given 06/19/22 2002)  HYDROmorphone (DILAUDID) injection 1 mg (1 mg Intravenous Given 06/19/22 2147)  metoCLOPramide (REGLAN) injection 10 mg (10 mg Intravenous Given 06/19/22 2147)  diphenhydrAMINE (BENADRYL) injection 50 mg (50 mg Intravenous Given 06/19/22 2146)  sodium chloride 0.9 % bolus 1,000 mL (1,000 mLs Intravenous New Bag/Given 06/19/22 2146)  iohexol (OMNIPAQUE) 350 MG/ML injection 100 mL (100 mLs Intravenous Contrast Given 06/19/22 2222)    ED Course/ Medical Decision Making/ A&P                           Medical Decision Making LOGHAN SUBIA is a 41 y.o. female here presenting with vomiting and palpitations.  Patient is noted to have sinus tachycardia.  Patient has been vomiting  for several days.  Also has abdominal distention.  Concern for possible ileus versus diabetic gastroparesis versus partial bowel obstruction versus PE.  Plan to get CBC CMP and CTA chest and CT abdomen pelvis  11:03 PM Reviewed patient's labs and independently interpreted imaging studies.  Patient has no PE but patient has partial small bowel obstruction.  Patient is still nauseated.  I have ordered NG tube and message to general surgery group regarding SBO.  Medicine service to admit.    Problems Addressed: SBO (small bowel obstruction) (HCC): acute illness or injury Tachycardia: acute illness or injury  Amount and/or Complexity of Data Reviewed Labs: ordered. Decision-making details documented in ED Course. Radiology: ordered and independent interpretation performed. Decision-making details documented in ED Course. ECG/medicine tests: ordered and independent interpretation performed. Decision-making details documented in ED Course.  Risk Prescription drug management. Decision regarding hospitalization.    Final Clinical Impression(s) / ED Diagnoses Final diagnoses:  None    Rx / DC Orders ED Discharge Orders     None         Charlynne Pander, MD 06/19/22 2304

## 2022-06-19 NOTE — ED Notes (Signed)
Patient transported to CT 
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
## 2022-06-20 ENCOUNTER — Encounter (HOSPITAL_COMMUNITY): Payer: Self-pay | Admitting: Internal Medicine

## 2022-06-20 ENCOUNTER — Inpatient Hospital Stay (HOSPITAL_COMMUNITY): Payer: Medicaid Other

## 2022-06-20 DIAGNOSIS — K5909 Other constipation: Secondary | ICD-10-CM | POA: Diagnosis present

## 2022-06-20 DIAGNOSIS — K621 Rectal polyp: Secondary | ICD-10-CM | POA: Diagnosis present

## 2022-06-20 DIAGNOSIS — R000 Tachycardia, unspecified: Secondary | ICD-10-CM | POA: Diagnosis present

## 2022-06-20 DIAGNOSIS — R0602 Shortness of breath: Secondary | ICD-10-CM

## 2022-06-20 DIAGNOSIS — I10 Essential (primary) hypertension: Secondary | ICD-10-CM | POA: Diagnosis present

## 2022-06-20 DIAGNOSIS — W449XXS Unspecified foreign body entering into or through a natural orifice, sequela: Secondary | ICD-10-CM | POA: Diagnosis not present

## 2022-06-20 DIAGNOSIS — E785 Hyperlipidemia, unspecified: Secondary | ICD-10-CM | POA: Diagnosis present

## 2022-06-20 DIAGNOSIS — Z6841 Body Mass Index (BMI) 40.0 and over, adult: Secondary | ICD-10-CM | POA: Diagnosis not present

## 2022-06-20 DIAGNOSIS — I890 Lymphedema, not elsewhere classified: Secondary | ICD-10-CM | POA: Diagnosis present

## 2022-06-20 DIAGNOSIS — Z79899 Other long term (current) drug therapy: Secondary | ICD-10-CM | POA: Diagnosis not present

## 2022-06-20 DIAGNOSIS — D649 Anemia, unspecified: Secondary | ICD-10-CM | POA: Diagnosis present

## 2022-06-20 DIAGNOSIS — Z79890 Hormone replacement therapy (postmenopausal): Secondary | ICD-10-CM | POA: Diagnosis not present

## 2022-06-20 DIAGNOSIS — R197 Diarrhea, unspecified: Secondary | ICD-10-CM | POA: Diagnosis not present

## 2022-06-20 DIAGNOSIS — F603 Borderline personality disorder: Secondary | ICD-10-CM | POA: Diagnosis present

## 2022-06-20 DIAGNOSIS — K56609 Unspecified intestinal obstruction, unspecified as to partial versus complete obstruction: Secondary | ICD-10-CM | POA: Diagnosis present

## 2022-06-20 DIAGNOSIS — K219 Gastro-esophageal reflux disease without esophagitis: Secondary | ICD-10-CM | POA: Diagnosis present

## 2022-06-20 DIAGNOSIS — E119 Type 2 diabetes mellitus without complications: Secondary | ICD-10-CM | POA: Diagnosis present

## 2022-06-20 DIAGNOSIS — N179 Acute kidney failure, unspecified: Secondary | ICD-10-CM | POA: Diagnosis present

## 2022-06-20 DIAGNOSIS — F4320 Adjustment disorder, unspecified: Secondary | ICD-10-CM | POA: Diagnosis present

## 2022-06-20 DIAGNOSIS — Z888 Allergy status to other drugs, medicaments and biological substances status: Secondary | ICD-10-CM | POA: Diagnosis not present

## 2022-06-20 DIAGNOSIS — K640 First degree hemorrhoids: Secondary | ICD-10-CM | POA: Diagnosis present

## 2022-06-20 DIAGNOSIS — K5669 Other partial intestinal obstruction: Secondary | ICD-10-CM | POA: Diagnosis not present

## 2022-06-20 DIAGNOSIS — F209 Schizophrenia, unspecified: Secondary | ICD-10-CM | POA: Diagnosis present

## 2022-06-20 DIAGNOSIS — F319 Bipolar disorder, unspecified: Secondary | ICD-10-CM | POA: Diagnosis present

## 2022-06-20 DIAGNOSIS — E039 Hypothyroidism, unspecified: Secondary | ICD-10-CM | POA: Diagnosis present

## 2022-06-20 DIAGNOSIS — F79 Unspecified intellectual disabilities: Secondary | ICD-10-CM | POA: Diagnosis present

## 2022-06-20 LAB — URINALYSIS, ROUTINE W REFLEX MICROSCOPIC
Bilirubin Urine: NEGATIVE
Glucose, UA: NEGATIVE mg/dL
Hgb urine dipstick: NEGATIVE
Ketones, ur: 5 mg/dL — AB
Leukocytes,Ua: NEGATIVE
Nitrite: NEGATIVE
Protein, ur: 30 mg/dL — AB
Specific Gravity, Urine: >1.046 — ABNORMAL HIGH (ref 1.005–1.030)
pH: 5 (ref 5.0–8.0)

## 2022-06-20 LAB — CBC
HCT: 38.5 % (ref 36.0–46.0)
Hemoglobin: 12.2 g/dL (ref 12.0–15.0)
MCH: 28.4 pg (ref 26.0–34.0)
MCHC: 31.7 g/dL (ref 30.0–36.0)
MCV: 89.7 fL (ref 80.0–100.0)
Platelets: 247 10*3/uL (ref 150–400)
RBC: 4.29 MIL/uL (ref 3.87–5.11)
RDW: 12.8 % (ref 11.5–15.5)
WBC: 8.2 10*3/uL (ref 4.0–10.5)
nRBC: 0 % (ref 0.0–0.2)

## 2022-06-20 LAB — BASIC METABOLIC PANEL
Anion gap: 12 (ref 5–15)
BUN: 27 mg/dL — ABNORMAL HIGH (ref 6–20)
CO2: 24 mmol/L (ref 22–32)
Calcium: 8.4 mg/dL — ABNORMAL LOW (ref 8.9–10.3)
Chloride: 101 mmol/L (ref 98–111)
Creatinine, Ser: 1.34 mg/dL — ABNORMAL HIGH (ref 0.44–1.00)
GFR, Estimated: 51 mL/min — ABNORMAL LOW (ref >60)
Glucose, Bld: 114 mg/dL — ABNORMAL HIGH (ref 70–99)
Potassium: 4.2 mmol/L (ref 3.5–5.1)
Sodium: 137 mmol/L (ref 135–145)

## 2022-06-20 LAB — MRSA NEXT GEN BY PCR, NASAL: MRSA by PCR Next Gen: NOT DETECTED

## 2022-06-20 LAB — MAGNESIUM: Magnesium: 2.3 mg/dL (ref 1.7–2.4)

## 2022-06-20 LAB — TSH: TSH: 1.983 u[IU]/mL (ref 0.350–4.500)

## 2022-06-20 LAB — HIV ANTIBODY (ROUTINE TESTING W REFLEX): HIV Screen 4th Generation wRfx: NONREACTIVE

## 2022-06-20 LAB — CBG MONITORING, ED: Glucose-Capillary: 117 mg/dL — ABNORMAL HIGH (ref 70–99)

## 2022-06-20 MED ORDER — SODIUM CHLORIDE 0.9 % IV SOLN: INTRAVENOUS | Status: DC

## 2022-06-20 MED ORDER — METOPROLOL TARTRATE 5 MG/5ML IV SOLN
2.5000 mg | Freq: Four times a day (QID) | INTRAVENOUS | Status: DC
  Administered 2022-06-20 – 2022-06-21 (×4): 2.5 mg via INTRAVENOUS
  Filled 2022-06-20 (×5): qty 5

## 2022-06-20 MED ORDER — ONDANSETRON HCL 4 MG PO TABS: 4.0000 mg | ORAL_TABLET | Freq: Four times a day (QID) | ORAL | Status: DC | PRN

## 2022-06-20 MED ORDER — HEPARIN SODIUM (PORCINE) 5000 UNIT/ML IJ SOLN
5000.0000 [IU] | Freq: Three times a day (TID) | INTRAMUSCULAR | Status: DC
  Administered 2022-06-20 – 2022-06-29 (×29): 5000 [IU] via SUBCUTANEOUS
  Filled 2022-06-20 (×29): qty 1

## 2022-06-20 MED ORDER — FENTANYL CITRATE PF 50 MCG/ML IJ SOSY
12.5000 ug | PREFILLED_SYRINGE | INTRAMUSCULAR | Status: DC | PRN
  Administered 2022-06-20 – 2022-06-24 (×4): 50 ug via INTRAVENOUS
  Filled 2022-06-20 (×4): qty 1

## 2022-06-20 MED ORDER — ALBUTEROL SULFATE (2.5 MG/3ML) 0.083% IN NEBU: 2.5000 mg | INHALATION_SOLUTION | RESPIRATORY_TRACT | Status: DC | PRN

## 2022-06-20 MED ORDER — LORAZEPAM 2 MG/ML IJ SOLN
0.5000 mg | Freq: Three times a day (TID) | INTRAMUSCULAR | Status: DC
  Administered 2022-06-20 – 2022-06-26 (×18): 0.5 mg via INTRAVENOUS
  Filled 2022-06-20 (×18): qty 1

## 2022-06-20 MED ORDER — DIATRIZOATE MEGLUMINE & SODIUM 66-10 % PO SOLN
90.0000 mL | Freq: Once | ORAL | Status: AC
  Administered 2022-06-20: 90 mL via NASOGASTRIC
  Filled 2022-06-20: qty 90

## 2022-06-20 MED ORDER — METOPROLOL TARTRATE 5 MG/5ML IV SOLN: 5.0000 mg | Freq: Once | INTRAVENOUS | Status: AC

## 2022-06-20 MED ORDER — HALOPERIDOL LACTATE 5 MG/ML IJ SOLN: 2.0000 mg | Freq: Four times a day (QID) | INTRAMUSCULAR | Status: DC | PRN

## 2022-06-20 MED ORDER — ONDANSETRON HCL 4 MG/2ML IJ SOLN
4.0000 mg | Freq: Four times a day (QID) | INTRAMUSCULAR | Status: DC | PRN
  Administered 2022-06-20 – 2022-06-22 (×4): 4 mg via INTRAVENOUS
  Filled 2022-06-20 (×5): qty 2

## 2022-06-20 MED ORDER — METOPROLOL TARTRATE 5 MG/5ML IV SOLN
INTRAVENOUS | Status: AC
  Administered 2022-06-20: 5 mg via INTRAVENOUS
  Filled 2022-06-20: qty 5

## 2022-06-20 NOTE — ED Notes (Signed)
Patient placed on a pure wick at this time.

## 2022-06-20 NOTE — Consult Note (Addendum)
Consult Note  Megan Manning 09-Sep-1980  [id number].    Requesting MD: Dr. Maisie Fus Chief Complaint/Reason for Consult: SBO  HPI:  41 y.o. female with medical history significant for borderline personality disorder, DM, HTN, hypothyroidism, intellectual disability, lymphedema who presented to [facility] ED with abdominal pain and vomiting. She presented initially to UC but was transported via EMS to ED due to tachycardia.  Work up in ED significant for CT scan showing partial small bowel obstruction. Patient admitted to hospitalist service and general surgery asked to consult.  She states she has a legal guardian, Alcario Drought, who helps with her medical care. She has additional caregivers listed in her chart. She is still nauseas at time of my visit and states she has thrown up today. NGT placement attempted but not successful. She denies passing flatus recently and cannot remember last BM.  She has not had prior abdominal surgeries. She is not on anticoagulation   ROS: ROS Reviewed and negative except as above  Family History  Problem Relation Age of Onset   Healthy Mother    Healthy Father    CVA Neg Hx     Past Medical History:  Diagnosis Date   Adjustment disorder    Borderline personality disorder (HCC)    Diabetes mellitus without complication (HCC)    Hypertension    Hyperthyroidism    Intellectual disability    Lactic acidosis    Lymphedema    Obesity    Overdose    Pica in adults    Thyroid disease     Past Surgical History:  Procedure Laterality Date   COLONOSCOPY WITH PROPOFOL N/A 12/22/2016   Procedure: COLONOSCOPY WITH PROPOFOL;  Surgeon: Wyline Mood, MD;  Location: [facility] ENDOSCOPY;  Service: Endoscopy;  Laterality: N/A;   NO PAST SURGERIES      Social History:  reports that she has never smoked. She has never used smokeless tobacco. She reports that she does not drink alcohol and does not use drugs.  Allergies:  Allergies  Allergen Reactions    Chlorpromazine Other (See Comments)    Caused cornea problems    (Not in a hospital admission)   Blood pressure (!) 152/110, pulse (!) 142, temperature 98.2 F (36.8 C), temperature source Oral, resp. rate 19, height 5\' 3"  (1.6 m), weight 104.3 kg, SpO2 98 %. Physical Exam: General: pleasant, WD, female who is laying in bed in NAD HEENT: head is normocephalic, atraumatic.  Sclera are noninjected.  Pupils equal and round. EOMs intact.  Ears and nose without any masses or lesions.  Mouth is pink and moist Heart: regular, rate, and rhythm.  Palpable radial and pedal pulses bilaterally Lungs: CTAB, no wheezes, rhonchi, or rales noted.  Respiratory effort nonlabored Abd: soft, hypoactive BS. TTP in central abdomen without rebound or guarding. Mildly distended MSK: bilateral lower extremity edema Skin: warm and dry with no masses, lesions, or rashes Psych: A&Ox3 with an appropriate affect.    Results for orders placed or performed during the hospital encounter of 06/19/22 (from the past 48 hour(s))  TSH     Status: None   Collection Time: 06/19/22  1:23 AM  Result Value Ref Range   TSH 1.983 0.350 - 4.500 uIU/mL    Comment: Performed by a 3rd Generation assay with a functional sensitivity of <=0.01 uIU/mL. Performed at [facility], [address], [place], [place]   CBC with Differential     Status: None   Collection Time:  06/19/22  7:54 PM  Result Value Ref Range   WBC 9.4 4.0 - 10.5 K/uL   RBC 5.04 3.87 - 5.11 MIL/uL   Hemoglobin 14.4 12.0 - 15.0 g/dL   HCT 16.144.0 09.636.0 - 04.546.0 %   MCV 87.3 80.0 - 100.0 fL   MCH 28.6 26.0 - 34.0 pg   MCHC 32.7 30.0 - 36.0 g/dL   RDW 40.912.8 81.111.5 - 91.415.5 %   Platelets 297 150 - 400 K/uL   nRBC 0.0 0.0 - 0.2 %   Neutrophils Relative % 68 %   Neutro Abs 6.4 1.7 - 7.7 K/uL   Lymphocytes Relative 22 %   Lymphs Abs 2.1 0.7 - 4.0 K/uL   Monocytes Relative 9 %   Monocytes Absolute 0.8 0.1 - 1.0 K/uL   Eosinophils Relative 0 %    Eosinophils Absolute 0.0 0.0 - 0.5 K/uL   Basophils Relative 0 %   Basophils Absolute 0.0 0.0 - 0.1 K/uL   Immature Granulocytes 1 %   Abs Immature Granulocytes 0.05 0.00 - 0.07 K/uL    Comment: Performed at [facility], [address] [place], [place] [address]  Comprehensive metabolic panel     Status: Abnormal   Collection Time: 06/19/22  7:54 PM  Result Value Ref Range   Sodium 140 135 - 145 mmol/L   Potassium 4.5 3.5 - 5.1 mmol/L   Chloride 97 (L) 98 - 111 mmol/L   CO2 25 22 - 32 mmol/L   Glucose, Bld 132 (H) 70 - 99 mg/dL    Comment: Glucose reference range applies only to samples taken after fasting for at least 8 hours.   BUN 29 (H) 6 - 20 mg/dL   Creatinine, Ser 6.211.42 (H) 0.44 - 1.00 mg/dL   Calcium 9.5 8.9 - 30.810.3 mg/dL   Total Protein 7.1 6.5 - 8.1 g/dL   Albumin 3.9 3.5 - 5.0 g/dL   AST 20 15 - 41 U/L   ALT 11 0 - 44 U/L   Alkaline Phosphatase 45 38 - 126 U/L   Total Bilirubin 0.8 0.3 - 1.2 mg/dL   GFR, Estimated 47 (L) >60 mL/min    Comment: (NOTE) Calculated using the CKD-EPI Creatinine Equation (2021)    Anion gap 18 (H) 5 - 15    Comment: Performed at [facility], [address] [place], [place] [address]  Lipase, blood     Status: None   Collection Time: 06/19/22  7:54 PM  Result Value Ref Range   Lipase 41 11 - 51 U/L    Comment: Performed at [facility], [address] [place], [place] [address]  I-Stat Beta hCG blood, ED ([facility], [facility], [facility] only)     Status: None   Collection Time: 06/19/22  8:02 PM  Result Value Ref Range   I-stat hCG, quantitative <5.0 <5 mIU/mL   Comment 3            Comment:   GEST. AGE      CONC.  (mIU/mL)   <=1 WEEK        5 - 50     2 WEEKS       50 - 500     3 WEEKS       100 - 10,000     4 WEEKS     1,000 - 30,000        FEMALE AND NON-PREGNANT FEMALE:     LESS THAN 5 mIU/mL   I-stat  chem 8, ED (not at [facility] or [facility])     Status: Abnormal   Collection Time: 06/19/22  8:04 PM  Result Value Ref Range   Sodium 136  135 - 145 mmol/L   Potassium 4.4 3.5 - 5.1 mmol/L   Chloride 101 98 - 111 mmol/L   BUN 30 (H) 6 - 20 mg/dL   Creatinine, Ser 0.98 (H) 0.44 - 1.00 mg/dL   Glucose, Bld 119 (H) 70 - 99 mg/dL    Comment: Glucose reference range applies only to samples taken after fasting for at least 8 hours.   Calcium, Ion 1.05 (L) 1.15 - 1.40 mmol/L   TCO2 26 22 - 32 mmol/L   Hemoglobin 15.3 (H) 12.0 - 15.0 g/dL   HCT 14.7 82.9 - 56.2 %  Basic metabolic panel     Status: Abnormal   Collection Time: 06/20/22  5:27 AM  Result Value Ref Range   Sodium 137 135 - 145 mmol/L   Potassium 4.2 3.5 - 5.1 mmol/L   Chloride 101 98 - 111 mmol/L   CO2 24 22 - 32 mmol/L   Glucose, Bld 114 (H) 70 - 99 mg/dL    Comment: Glucose reference range applies only to samples taken after fasting for at least 8 hours.   BUN 27 (H) 6 - 20 mg/dL   Creatinine, Ser 1.30 (H) 0.44 - 1.00 mg/dL   Calcium 8.4 (L) 8.9 - 10.3 mg/dL   GFR, Estimated 51 (L) >60 mL/min    Comment: (NOTE) Calculated using the CKD-EPI Creatinine Equation (2021)    Anion gap 12 5 - 15    Comment: Performed at [facility], [address], [place], [place] [address]  Magnesium     Status: None   Collection Time: 06/20/22  5:27 AM  Result Value Ref Range   Magnesium 2.3 1.7 - 2.4 mg/dL    Comment: Performed at [facility], [address], [place], [place] [address]  CBC     Status: None   Collection Time: 06/20/22  5:27 AM  Result Value Ref Range   WBC 8.2 4.0 - 10.5 K/uL   RBC 4.29 3.87 - 5.11 MIL/uL   Hemoglobin 12.2 12.0 - 15.0 g/dL   HCT 95.2 84.1 - 32.4 %   MCV 89.7 80.0 - 100.0 fL   MCH 28.4 26.0 - 34.0 pg   MCHC 31.7 30.0 - 36.0 g/dL   RDW 40.1 02.7 - 25.3 %   Platelets 247 150 - 400 K/uL   nRBC 0.0 0.0 - 0.2 %    Comment: Performed at [facility], [address] [place], [place] [address]  Urinalysis, Routine w reflex microscopic Urine, Clean Catch     Status: Abnormal   Collection Time: 06/20/22  5:49 AM  Result  Value Ref Range   Color, Urine YELLOW YELLOW   APPearance CLEAR CLEAR   Specific Gravity, Urine >1.046 (H) 1.005 - 1.030   pH 5.0 5.0 - 8.0   Glucose, UA NEGATIVE NEGATIVE mg/dL   Hgb urine dipstick NEGATIVE NEGATIVE   Bilirubin Urine NEGATIVE NEGATIVE   Ketones, ur 5 (A) NEGATIVE mg/dL   Protein, ur 30 (A) NEGATIVE mg/dL   Nitrite NEGATIVE NEGATIVE   Leukocytes,Ua NEGATIVE NEGATIVE   RBC / HPF 0-5 0 - 5 RBC/hpf   WBC, UA 0-5 0 - 5 WBC/hpf   Bacteria, UA RARE (A) NONE SEEN   Squamous Epithelial / LPF 0-5 0 - 5  Comment: Performed at [facility], [address] [place], [place] [address]  CBG monitoring, ED     Status: Abnormal   Collection Time: 06/20/22  8:12 AM  Result Value Ref Range   Glucose-Capillary 117 (H) 70 - 99 mg/dL    Comment: Glucose reference range applies only to samples taken after fasting for at least 8 hours.   DG ABD ACUTE 2+V W 1V CHEST  Result Date: 06/20/2022 CLINICAL DATA:  Final pain. EXAM: DG ABDOMEN ACUTE WITH 1 VIEW CHEST COMPARISON:  May 06, 2022.  June 19, 2022. FINDINGS: Moderately dilated small bowel loops are noted without colonic dilatation. This is concerning for distal small bowel obstruction. No radiopaque calculi or other significant radiographic abnormality is seen. Heart size and mediastinal contours are within normal limits. Both lungs are clear. IMPRESSION: Moderately dilated small bowel loops are noted concerning for distal small bowel obstruction. No acute cardiopulmonary disease. Electronically Signed   By: Lupita Raider M.D.   On: 06/20/2022 08:03   CT ABDOMEN PELVIS W CONTRAST  Result Date: 06/19/2022 CLINICAL DATA:  Vomiting and fatigue for 2 days, initial encounter EXAM: CT ANGIOGRAPHY CHEST CT ABDOMEN AND PELVIS WITH CONTRAST TECHNIQUE: Multidetector CT imaging of the chest was performed using the standard protocol during bolus administration of intravenous contrast. Multiplanar CT image reconstructions and MIPs were  obtained to evaluate the vascular anatomy. Multidetector CT imaging of the abdomen and pelvis was performed using the standard protocol during bolus administration of intravenous contrast. RADIATION DOSE REDUCTION: This exam was performed according to the departmental dose-optimization program which includes automated exposure control, adjustment of the mA and/or kV according to patient size and/or use of iterative reconstruction technique. CONTRAST:  OMNIPAQUE IOHEXOL 350 MG/ML SOLN COMPARISON:  None Available. FINDINGS: CTA CHEST FINDINGS Cardiovascular: Thoracic aorta and its branches are within normal limits. No cardiac enlargement is seen. The pulmonary artery shows a normal branching pattern without intraluminal filling defect to suggest pulmonary embolism. Mediastinum/Nodes: Thoracic inlet is within normal limits. No hilar or mediastinal adenopathy is noted. The esophagus as visualized is within normal limits. Lungs/Pleura: Mild bibasilar atelectasis is noted. No sizable effusion or focal confluent infiltrate is seen. Musculoskeletal: No chest wall abnormality. No acute or significant osseous findings. Review of the MIP images confirms the above findings. CT ABDOMEN and PELVIS FINDINGS Hepatobiliary: No focal liver abnormality is seen. No gallstones, gallbladder wall thickening, or biliary dilatation. Pancreas: Unremarkable. No pancreatic ductal dilatation or surrounding inflammatory changes. Spleen: Normal in size without focal abnormality. Adrenals/Urinary Tract: Adrenal glands are within normal limits. Kidneys demonstrate a normal enhancement pattern. No renal calculi or obstructive changes are seen. The bladder is decompressed. Stomach/Bowel: Appendix is within normal limits. No obstructive or inflammatory changes of the colon are seen. Stomach is decompressed. There are several mildly dilated loops of small bowel identified in the mid abdomen which appear to involve the distal jejunum and proximal  ileum. Some inflammatory wall thickening is seen as well as a focal transition zone deep in the pelvis on image number 88 of series 3. Fecalization of bowel contents is noted proximal to this transition zone consistent with a partial small bowel obstruction. Vascular/Lymphatic: No significant vascular findings are present. No enlarged abdominal or pelvic lymph nodes. Reproductive: Uterus and bilateral adnexa are unremarkable. Other: Mild free fluid is noted within the pelvis and surrounding the spleen likely reactive in nature to the changes in the small bowel. No free air is seen. No hernia is  noted. Musculoskeletal: Mild degenerative changes of the lumbar spine are noted. Review of the MIP images confirms the above findings. IMPRESSION: Dilated loops of mid small bowel with fecalization of bowel contents and wall edema consistent with a mild partial small bowel obstruction. Focal transition zone is noted deep within the pelvis although no mass lesion is seen. Electronically Signed   By: Alcide Clever M.D.   On: 06/19/2022 22:40   CT Angio Chest PE W and/or Wo Contrast  Result Date: 06/19/2022 CLINICAL DATA:  Vomiting and fatigue for 2 days, initial encounter EXAM: CT ANGIOGRAPHY CHEST CT ABDOMEN AND PELVIS WITH CONTRAST TECHNIQUE: Multidetector CT imaging of the chest was performed using the standard protocol during bolus administration of intravenous contrast. Multiplanar CT image reconstructions and MIPs were obtained to evaluate the vascular anatomy. Multidetector CT imaging of the abdomen and pelvis was performed using the standard protocol during bolus administration of intravenous contrast. RADIATION DOSE REDUCTION: This exam was performed according to the departmental dose-optimization program which includes automated exposure control, adjustment of the mA and/or kV according to patient size and/or use of iterative reconstruction technique. CONTRAST:  OMNIPAQUE IOHEXOL 350 MG/ML SOLN COMPARISON:   None Available. FINDINGS: CTA CHEST FINDINGS Cardiovascular: Thoracic aorta and its branches are within normal limits. No cardiac enlargement is seen. The pulmonary artery shows a normal branching pattern without intraluminal filling defect to suggest pulmonary embolism. Mediastinum/Nodes: Thoracic inlet is within normal limits. No hilar or mediastinal adenopathy is noted. The esophagus as visualized is within normal limits. Lungs/Pleura: Mild bibasilar atelectasis is noted. No sizable effusion or focal confluent infiltrate is seen. Musculoskeletal: No chest wall abnormality. No acute or significant osseous findings. Review of the MIP images confirms the above findings. CT ABDOMEN and PELVIS FINDINGS Hepatobiliary: No focal liver abnormality is seen. No gallstones, gallbladder wall thickening, or biliary dilatation. Pancreas: Unremarkable. No pancreatic ductal dilatation or surrounding inflammatory changes. Spleen: Normal in size without focal abnormality. Adrenals/Urinary Tract: Adrenal glands are within normal limits. Kidneys demonstrate a normal enhancement pattern. No renal calculi or obstructive changes are seen. The bladder is decompressed. Stomach/Bowel: Appendix is within normal limits. No obstructive or inflammatory changes of the colon are seen. Stomach is decompressed. There are several mildly dilated loops of small bowel identified in the mid abdomen which appear to involve the distal jejunum and proximal ileum. Some inflammatory wall thickening is seen as well as a focal transition zone deep in the pelvis on image number 88 of series 3. Fecalization of bowel contents is noted proximal to this transition zone consistent with a partial small bowel obstruction. Vascular/Lymphatic: No significant vascular findings are present. No enlarged abdominal or pelvic lymph nodes. Reproductive: Uterus and bilateral adnexa are unremarkable. Other: Mild free fluid is noted within the pelvis and surrounding the spleen  likely reactive in nature to the changes in the small bowel. No free air is seen. No hernia is noted. Musculoskeletal: Mild degenerative changes of the lumbar spine are noted. Review of the MIP images confirms the above findings. IMPRESSION: Dilated loops of mid small bowel with fecalization of bowel contents and wall edema consistent with a mild partial small bowel obstruction. Focal transition zone is noted deep within the pelvis although no mass lesion is seen. Electronically Signed   By: Alcide Clever M.D.   On: 06/19/2022 22:40      Assessment/Plan pSBO - no prior abdominal surgeries - CT w/ contrast showing Dilated loops of mid small bowel with fecalization of bowel contents  and wall edema consistent with a mild partial small bowel obstruction. Focal transition zone is noted deep within the pelvis although no mass lesion is seen. - No current indication for emergency surgery. Abdomen is tender put no peritonitis - Place NGT for decompression and keep NPO - has failed one attempt and have asked RN to try again - Start SBO protocol once NGT placed - Keep K > 4 and Mg > 2 for bowel function - Mobilize for bowel function - Hopefully patient will improve with conservative management. If patient fails to improve with conservative management, he may require exploratory surgery during admission - Agree with medical admission. We will follow with you.   FEN: NPO, NGT, IVF per primary ID: none VTE: heparin subq  Per primary borderline personality disorder DM HTN Hypothyroidism intellectual disability - has legal guardian lymphedema   I reviewed ED provider notes, last 24 h vitals and pain scores, last 48 h intake and output, last 24 h labs and trends, and last 24 h imaging results.   Eric Form, [facility] Surgery 06/20/2022, 8:24 AM Please see Amion for pager number during day hours 7:00am-4:30pm

## 2022-06-20 NOTE — ED Notes (Signed)
Reattempted NG Tube insertion, patient panicked midway through procedure and pulled my hand and the tube out of her nose. Surgical team notified.

## 2022-06-20 NOTE — ED Notes (Signed)
Stuck pt once unable to get lactic

## 2022-06-20 NOTE — ED Notes (Signed)
Pt HR increased to 160-170s bpm. Alert and oriented x 4. Reports mid chest pain at this time. MD notified. Repeat EKG done.

## 2022-06-20 NOTE — ED Notes (Signed)
This NT with the help of another NT cleaned this pt up after pt had a bm on themself. Bed and gown was changed also washed pt up.

## 2022-06-20 NOTE — Progress Notes (Signed)
PROGRESS NOTE  Megan Manning MVH:[id number] DOB: May 05, 1981 DOA: 06/19/2022 PCP: Pcp, No   LOS: 0 days   Brief Narrative / Interim history: 41 year old female with borderline personality disorder, DM2, HTN, hypothyroidism, obesity, who comes into the hospital with abdominal pain, nausea, vomiting for the past 2 days.  She was found to have a small bowel obstruction, surgery was consulted and we are asked to admit.  Subjective / 24h Interval events: Could not tolerate the NG tube placement.  She is not feeling good, has ongoing nausea and abdominal pain.  Assesement and Plan: Principal Problem:   SOB (shortness of breath) Active Problems:   Small bowel obstruction (HCC)  Principal problem Small bowel obstruction-General surgery consulted, appreciate input.  Unfortunately she could not tolerate an NG tube.  Continue conservative management, n.p.o., IV fluids.  Concerning that she is quite tachycardic  Active problems Sinus tachycardia-driven by #1.  Continue IV fluids.  TSH was unremarkable  Acute kidney injury-due to poor p.o. intake, continue IV fluids  DMII -CBG unremarkable, monitor   HTN - not currently on medication other than lasix   Lymphedema -on lasix, will hold for now due to aki   HLD -hold statin currently , resume one patient tolerating po    Hypothyrodism -resume as able   Borderline personality disorder, schizophrenia-on Invega, Haldol at home. I will make Haldol available here  Scheduled Meds:  heparin  5,000 Units Subcutaneous Q8H   Continuous Infusions:  sodium chloride 150 mL/hr at 06/20/22 0525   PRN Meds:.albuterol, fentaNYL (SUBLIMAZE) injection, ondansetron **OR** ondansetron (ZOFRAN) IV  Current Outpatient Medications  Medication Instructions   acetaminophen (TYLENOL) 1,000 mg, Oral, Every 6 hours PRN   albuterol (VENTOLIN HFA) 108 (90 Base) MCG/ACT inhaler 2 puffs, Inhalation, Every 6 hours PRN   atorvastatin (LIPITOR) 20 mg, Oral, Daily at  bedtime   azelastine (ASTELIN) 0.1 % nasal spray 1 spray, Each Nare, 2 times daily, Use in each nostril as directed   divalproex (DEPAKOTE ER) 500 mg, Oral, 2 times daily   furosemide (LASIX) 40 mg, Oral, Daily   gabapentin (NEURONTIN) 300-600 mg, Oral, See admin instructions, Give 1 capsule in the morning and 2 capsules at bedtime   haloperidol (HALDOL) 5 mg, 2 times daily   Invega Sustenna 234 mg, Intramuscular, Every 28 days   levothyroxine (SYNTHROID) 88 mcg, Oral, Daily   loratadine (CLARITIN) 10 mg, Oral, Daily   LORazepam (ATIVAN) 1 mg, Oral, Every 8 hours   medroxyPROGESTERone (DEPO-PROVERA) 150 mg, Intramuscular, Every 3 months   metoprolol succinate (TOPROL-XL) 50 mg, Oral, Daily   montelukast (SINGULAIR) 10 mg, Oral, Daily at bedtime   nystatin (MYCOSTATIN/NYSTOP) powder 1 Application, Topical, 2 times daily   Olopatadine HCl 0.2 % SOLN 1 drop, Both Eyes, 2 times daily   omeprazole (PRILOSEC) 20 mg, Oral, Daily   potassium chloride SA (KLOR-CON M) 20 MEQ tablet 20 mEq, Oral, Daily   prazosin (MINIPRESS) 5 mg, Oral, Daily at bedtime   sertraline (ZOLOFT) 50 mg, Daily   traZODone (DESYREL) 200 mg, Daily at bedtime   Vitamin D (Ergocalciferol) (DRISDOL) 50,000 Units, Oral, Every 7 days    Diet Orders (From admission, onward)     Start     Ordered   06/20/22 0456  Diet NPO time specified  Diet effective now        06/20/22 0457            DVT prophylaxis: heparin injection 5,000 Units Start: 06/20/22 0600   Lab  Results  Component Value Date   PLT 247 06/20/2022      Code Status: Full Code  Family Communication: No family at bedside  Status is: Inpatient Remains inpatient appropriate because: Persistent SBO   Level of care: Progressive  Consultants:  General surgery  Objective: Vitals:   06/20/22 0500 06/20/22 0545 06/20/22 0615 06/20/22 0833  BP: 131/81 (!) 153/73 (!) 152/110   Pulse: (!) 125 (!) 134 (!) 142   Resp: (!) 21 (!) 26 19   Temp:    98.1  F (36.7 C)  TempSrc:    Oral  SpO2: 97% 96% 98%   Weight:      Height:        Intake/Output Summary (Last 24 hours) at 06/20/2022 2458 Last data filed at 06/20/2022 0117 Gross per 24 hour  Intake 2000 ml  Output --  Net 2000 ml   Wt Readings from Last 3 Encounters:  06/19/22 104.3 kg  12/24/21 121.1 kg  04/07/21 134.3 kg    Examination:  Constitutional: NAD Eyes: no scleral icterus ENMT: Mucous membranes are moist.  Neck: normal, supple Respiratory: clear to auscultation bilaterally, no wheezing, no crackles.  Cardiovascular: Regular rate and rhythm, no murmurs / rubs / gallops. No LE edema.  Abdomen: Mild tenderness throughout, no guarding or rebound, diminished bowel sounds Musculoskeletal: no clubbing / cyanosis.  Skin: no rashes Neurologic: non focal   Data Reviewed: I have independently reviewed following labs and imaging studies   CBC Recent Labs  Lab 06/19/22 1954 06/19/22 2004 06/20/22 0527  WBC 9.4  --  8.2  HGB 14.4 15.3* 12.2  HCT 44.0 45.0 38.5  PLT 297  --  247  MCV 87.3  --  89.7  MCH 28.6  --  28.4  MCHC 32.7  --  31.7  RDW 12.8  --  12.8  LYMPHSABS 2.1  --   --   MONOABS 0.8  --   --   EOSABS 0.0  --   --   BASOSABS 0.0  --   --     Recent Labs  Lab 06/19/22 0123 06/19/22 1954 06/19/22 2004 06/20/22 0527  NA  --  140 136 137  K  --  4.5 4.4 4.2  CL  --  97* 101 101  CO2  --  25  --  24  GLUCOSE  --  132* 131* 114*  BUN  --  29* 30* 27*  CREATININE  --  1.42* 1.40* 1.34*  CALCIUM  --  9.5  --  8.4*  AST  --  20  --   --   ALT  --  11  --   --   ALKPHOS  --  45  --   --   BILITOT  --  0.8  --   --   ALBUMIN  --  3.9  --   --   MG  --   --   --  2.3  TSH 1.983  --   --   --     ------------------------------------------------------------------------------------------------------------------ No results for input(s): "CHOL", "HDL", "LDLCALC", "TRIG", "CHOLHDL", "LDLDIRECT" in the last 72 hours.  Lab Results  Component  Value Date   HGBA1C 5.4 02/10/2017   ------------------------------------------------------------------------------------------------------------------ Recent Labs    06/19/22 0123  TSH 1.983    Cardiac Enzymes No results for input(s): "CKMB", "TROPONINI", "MYOGLOBIN" in the last 168 hours.  Invalid input(s): "CK" ------------------------------------------------------------------------------------------------------------------    Component Value Date/Time   BNP 8.0 09/28/2016 1513  CBG: Recent Labs  Lab 06/20/22 0812  GLUCAP 117*    No results found for this or any previous visit (from the past 240 hour(s)).   Radiology Studies: DG ABD ACUTE 2+V W 1V CHEST  Result Date: 06/20/2022 CLINICAL DATA:  Final pain. EXAM: DG ABDOMEN ACUTE WITH 1 VIEW CHEST COMPARISON:  May 06, 2022.  June 19, 2022. FINDINGS: Moderately dilated small bowel loops are noted without colonic dilatation. This is concerning for distal small bowel obstruction. No radiopaque calculi or other significant radiographic abnormality is seen. Heart size and mediastinal contours are within normal limits. Both lungs are clear. IMPRESSION: Moderately dilated small bowel loops are noted concerning for distal small bowel obstruction. No acute cardiopulmonary disease. Electronically Signed   By: Lupita Raider M.D.   On: 06/20/2022 08:03   CT ABDOMEN PELVIS W CONTRAST  Result Date: 06/19/2022 CLINICAL DATA:  Vomiting and fatigue for 2 days, initial encounter EXAM: CT ANGIOGRAPHY CHEST CT ABDOMEN AND PELVIS WITH CONTRAST TECHNIQUE: Multidetector CT imaging of the chest was performed using the standard protocol during bolus administration of intravenous contrast. Multiplanar CT image reconstructions and MIPs were obtained to evaluate the vascular anatomy. Multidetector CT imaging of the abdomen and pelvis was performed using the standard protocol during bolus administration of intravenous contrast. RADIATION DOSE  REDUCTION: This exam was performed according to the departmental dose-optimization program which includes automated exposure control, adjustment of the mA and/or kV according to patient size and/or use of iterative reconstruction technique. CONTRAST:  OMNIPAQUE IOHEXOL 350 MG/ML SOLN COMPARISON:  None Available. FINDINGS: CTA CHEST FINDINGS Cardiovascular: Thoracic aorta and its branches are within normal limits. No cardiac enlargement is seen. The pulmonary artery shows a normal branching pattern without intraluminal filling defect to suggest pulmonary embolism. Mediastinum/Nodes: Thoracic inlet is within normal limits. No hilar or mediastinal adenopathy is noted. The esophagus as visualized is within normal limits. Lungs/Pleura: Mild bibasilar atelectasis is noted. No sizable effusion or focal confluent infiltrate is seen. Musculoskeletal: No chest wall abnormality. No acute or significant osseous findings. Review of the MIP images confirms the above findings. CT ABDOMEN and PELVIS FINDINGS Hepatobiliary: No focal liver abnormality is seen. No gallstones, gallbladder wall thickening, or biliary dilatation. Pancreas: Unremarkable. No pancreatic ductal dilatation or surrounding inflammatory changes. Spleen: Normal in size without focal abnormality. Adrenals/Urinary Tract: Adrenal glands are within normal limits. Kidneys demonstrate a normal enhancement pattern. No renal calculi or obstructive changes are seen. The bladder is decompressed. Stomach/Bowel: Appendix is within normal limits. No obstructive or inflammatory changes of the colon are seen. Stomach is decompressed. There are several mildly dilated loops of small bowel identified in the mid abdomen which appear to involve the distal jejunum and proximal ileum. Some inflammatory wall thickening is seen as well as a focal transition zone deep in the pelvis on image number 88 of series 3. Fecalization of bowel contents is noted proximal to this transition  zone consistent with a partial small bowel obstruction. Vascular/Lymphatic: No significant vascular findings are present. No enlarged abdominal or pelvic lymph nodes. Reproductive: Uterus and bilateral adnexa are unremarkable. Other: Mild free fluid is noted within the pelvis and surrounding the spleen likely reactive in nature to the changes in the small bowel. No free air is seen. No hernia is noted. Musculoskeletal: Mild degenerative changes of the lumbar spine are noted. Review of the MIP images confirms the above findings. IMPRESSION: Dilated loops of mid small bowel with fecalization of bowel contents and wall edema consistent  with a mild partial small bowel obstruction. Focal transition zone is noted deep within the pelvis although no mass lesion is seen. Electronically Signed   By: Alcide CleverMark  Lukens M.D.   On: 06/19/2022 22:40   CT Angio Chest PE W and/or Wo Contrast  Result Date: 06/19/2022 CLINICAL DATA:  Vomiting and fatigue for 2 days, initial encounter EXAM: CT ANGIOGRAPHY CHEST CT ABDOMEN AND PELVIS WITH CONTRAST TECHNIQUE: Multidetector CT imaging of the chest was performed using the standard protocol during bolus administration of intravenous contrast. Multiplanar CT image reconstructions and MIPs were obtained to evaluate the vascular anatomy. Multidetector CT imaging of the abdomen and pelvis was performed using the standard protocol during bolus administration of intravenous contrast. RADIATION DOSE REDUCTION: This exam was performed according to the departmental dose-optimization program which includes automated exposure control, adjustment of the mA and/or kV according to patient size and/or use of iterative reconstruction technique. CONTRAST:  100mL OMNIPAQUE IOHEXOL 350 MG/ML SOLN COMPARISON:  None Available. FINDINGS: CTA CHEST FINDINGS Cardiovascular: Thoracic aorta and its branches are within normal limits. No cardiac enlargement is seen. The pulmonary artery shows a normal branching  pattern without intraluminal filling defect to suggest pulmonary embolism. Mediastinum/Nodes: Thoracic inlet is within normal limits. No hilar or mediastinal adenopathy is noted. The esophagus as visualized is within normal limits. Lungs/Pleura: Mild bibasilar atelectasis is noted. No sizable effusion or focal confluent infiltrate is seen. Musculoskeletal: No chest wall abnormality. No acute or significant osseous findings. Review of the MIP images confirms the above findings. CT ABDOMEN and PELVIS FINDINGS Hepatobiliary: No focal liver abnormality is seen. No gallstones, gallbladder wall thickening, or biliary dilatation. Pancreas: Unremarkable. No pancreatic ductal dilatation or surrounding inflammatory changes. Spleen: Normal in size without focal abnormality. Adrenals/Urinary Tract: Adrenal glands are within normal limits. Kidneys demonstrate a normal enhancement pattern. No renal calculi or obstructive changes are seen. The bladder is decompressed. Stomach/Bowel: Appendix is within normal limits. No obstructive or inflammatory changes of the colon are seen. Stomach is decompressed. There are several mildly dilated loops of small bowel identified in the mid abdomen which appear to involve the distal jejunum and proximal ileum. Some inflammatory wall thickening is seen as well as a focal transition zone deep in the pelvis on image number 88 of series 3. Fecalization of bowel contents is noted proximal to this transition zone consistent with a partial small bowel obstruction. Vascular/Lymphatic: No significant vascular findings are present. No enlarged abdominal or pelvic lymph nodes. Reproductive: Uterus and bilateral adnexa are unremarkable. Other: Mild free fluid is noted within the pelvis and surrounding the spleen likely reactive in nature to the changes in the small bowel. No free air is seen. No hernia is noted. Musculoskeletal: Mild degenerative changes of the lumbar spine are noted. Review of the MIP  images confirms the above findings. IMPRESSION: Dilated loops of mid small bowel with fecalization of bowel contents and wall edema consistent with a mild partial small bowel obstruction. Focal transition zone is noted deep within the pelvis although no mass lesion is seen. Electronically Signed   By: Alcide CleverMark  Lukens M.D.   On: 06/19/2022 22:40     Pamella Pertostin Alannie Amodio, MD, PhD [facility]  Between 7 am - 7 pm I am available, please contact me via Amion (for emergencies) or Securechat (non urgent messages)  Between 7 pm - 7 am I am not available, please contact night coverage MD/APP via Amion

## 2022-06-21 DIAGNOSIS — R0602 Shortness of breath: Secondary | ICD-10-CM | POA: Diagnosis not present

## 2022-06-21 LAB — COMPREHENSIVE METABOLIC PANEL
ALT: 9 U/L (ref 0–44)
AST: 13 U/L — ABNORMAL LOW (ref 15–41)
Albumin: 3.1 g/dL — ABNORMAL LOW (ref 3.5–5.0)
Alkaline Phosphatase: 34 U/L — ABNORMAL LOW (ref 38–126)
Anion gap: 12 (ref 5–15)
BUN: 16 mg/dL (ref 6–20)
CO2: 25 mmol/L (ref 22–32)
Calcium: 8.7 mg/dL — ABNORMAL LOW (ref 8.9–10.3)
Chloride: 104 mmol/L (ref 98–111)
Creatinine, Ser: 0.98 mg/dL (ref 0.44–1.00)
GFR, Estimated: >60 mL/min (ref >60)
Glucose, Bld: 89 mg/dL (ref 70–99)
Potassium: 3.9 mmol/L (ref 3.5–5.1)
Sodium: 141 mmol/L (ref 135–145)
Total Bilirubin: 0.5 mg/dL (ref 0.3–1.2)
Total Protein: 5.6 g/dL — ABNORMAL LOW (ref 6.5–8.1)

## 2022-06-21 LAB — CBC
HCT: 32.9 % — ABNORMAL LOW (ref 36.0–46.0)
Hemoglobin: 10.7 g/dL — ABNORMAL LOW (ref 12.0–15.0)
MCH: 28.5 pg (ref 26.0–34.0)
MCHC: 32.5 g/dL (ref 30.0–36.0)
MCV: 87.7 fL (ref 80.0–100.0)
Platelets: 213 10*3/uL (ref 150–400)
RBC: 3.75 MIL/uL — ABNORMAL LOW (ref 3.87–5.11)
RDW: 12.8 % (ref 11.5–15.5)
WBC: 7.4 10*3/uL (ref 4.0–10.5)
nRBC: 0 % (ref 0.0–0.2)

## 2022-06-21 LAB — LACTIC ACID, PLASMA
Lactic Acid, Venous: 0.6 mmol/L (ref 0.5–1.9)
Lactic Acid, Venous: 1.9 mmol/L (ref 0.5–1.9)

## 2022-06-21 LAB — MAGNESIUM: Magnesium: 2.3 mg/dL (ref 1.7–2.4)

## 2022-06-21 LAB — GLUCOSE, CAPILLARY: Glucose-Capillary: 111 mg/dL — ABNORMAL HIGH (ref 70–99)

## 2022-06-21 MED ORDER — METOPROLOL SUCCINATE ER 50 MG PO TB24
50.0000 mg | ORAL_TABLET | Freq: Every day | ORAL | Status: DC
  Administered 2022-06-21 – 2022-06-28 (×8): 50 mg via ORAL
  Filled 2022-06-21 (×8): qty 1

## 2022-06-21 MED ORDER — FUROSEMIDE 40 MG PO TABS
40.0000 mg | ORAL_TABLET | Freq: Every day | ORAL | Status: DC
  Administered 2022-06-22: 40 mg via ORAL
  Filled 2022-06-21: qty 1

## 2022-06-21 MED ORDER — PANTOPRAZOLE SODIUM 40 MG PO TBEC
40.0000 mg | DELAYED_RELEASE_TABLET | Freq: Every day | ORAL | Status: DC
  Administered 2022-06-21 – 2022-06-29 (×9): 40 mg via ORAL
  Filled 2022-06-21 (×9): qty 1

## 2022-06-21 MED ORDER — GABAPENTIN 300 MG PO CAPS
300.0000 mg | ORAL_CAPSULE | Freq: Every morning | ORAL | Status: DC
  Administered 2022-06-21 – 2022-06-29 (×9): 300 mg via ORAL
  Filled 2022-06-21 (×9): qty 1

## 2022-06-21 MED ORDER — DIVALPROEX SODIUM ER 500 MG PO TB24
500.0000 mg | ORAL_TABLET | Freq: Two times a day (BID) | ORAL | Status: DC
  Administered 2022-06-21 – 2022-06-29 (×17): 500 mg via ORAL
  Filled 2022-06-21 (×18): qty 1

## 2022-06-21 MED ORDER — GABAPENTIN 300 MG PO CAPS
600.0000 mg | ORAL_CAPSULE | Freq: Every day | ORAL | Status: DC
  Administered 2022-06-21 – 2022-06-28 (×8): 600 mg via ORAL
  Filled 2022-06-21 (×8): qty 2

## 2022-06-21 MED ORDER — ATORVASTATIN CALCIUM 10 MG PO TABS
20.0000 mg | ORAL_TABLET | Freq: Every day | ORAL | Status: DC
  Administered 2022-06-21 – 2022-06-28 (×8): 20 mg via ORAL
  Filled 2022-06-21 (×8): qty 2

## 2022-06-21 MED ORDER — GABAPENTIN 300 MG PO CAPS: 300.0000 mg | ORAL_CAPSULE | ORAL | Status: DC

## 2022-06-21 MED ORDER — PRAZOSIN HCL 2 MG PO CAPS
5.0000 mg | ORAL_CAPSULE | Freq: Every day | ORAL | Status: DC
  Administered 2022-06-21 – 2022-06-25 (×5): 5 mg via ORAL
  Filled 2022-06-21 (×6): qty 1

## 2022-06-21 MED ORDER — LEVOTHYROXINE SODIUM 88 MCG PO TABS
88.0000 ug | ORAL_TABLET | Freq: Every day | ORAL | Status: DC
  Administered 2022-06-22 – 2022-06-29 (×8): 88 ug via ORAL
  Filled 2022-06-21 (×8): qty 1

## 2022-06-21 NOTE — Progress Notes (Signed)
Subjective/Chief Complaint: Having bowel function, no ab pain   Objective: Vital signs in last 24 hours: Temp:  [98 F (36.7 C)-98.7 F (37.1 C)] 98.6 F (37 C) (11/26 0745) Pulse Rate:  [88-130] 114 (11/26 0745) Resp:  [12-23] 18 (11/26 0745) BP: (100-139)/(70-83) 139/81 (11/26 0745) SpO2:  [90 %-97 %] 93 % (11/26 0745) Weight:  [105.5 kg] 105.5 kg (11/26 0500) Last BM Date : 06/20/22  Intake/Output from previous day: No intake/output data recorded. Intake/Output this shift: No intake/output data recorded.  Ab soft nt/nd  Lab Results:  Recent Labs    06/20/22 0527 06/21/22 0510  WBC 8.2 7.4  HGB 12.2 10.7*  HCT 38.5 32.9*  PLT 247 213   BMET Recent Labs    06/20/22 0527 06/21/22 0510  NA 137 141  K 4.2 3.9  CL 101 104  CO2 24 25  GLUCOSE 114* 89  BUN 27* 16  CREATININE 1.34* 0.98  CALCIUM 8.4* 8.7*   PT/INR No results for input(s): "LABPROT", "INR" in the last 72 hours. ABG No results for input(s): "PHART", "HCO3" in the last 72 hours.  Invalid input(s): "PCO2", "PO2"  Studies/Results: DG Abd Portable 1V-Small Bowel Obstruction Protocol-initial, 8 hr delay  Result Date: 06/20/2022 CLINICAL DATA:  : small bowel obstruction 8 hr delay Chief complaints: abdominal pain Principal problem: SOB EXAM: PORTABLE ABDOMEN - 1 VIEW COMPARISON:  X-ray abdomen 06/20/2022 1:53 p.m., CT abdomen pelvis 06/19/2022 FINDINGS: Enteric tube overlying the expected region of the gastric lumen/gastric antrum. PO contrast reaches the rectum. Small bowel gaseous dilatation measuring up to 4 cm. No radio-opaque calculi or other significant radiographic abnormality are seen. IMPRESSION: 1. Enteric tube overlying the expected region of the gastric lumen/first portion of duodenum. 2. PO contrast reaches the rectum. 3. Partial small bowel obstruction versus ileus. Electronically Signed   By: Tish Frederickson M.D.   On: 06/20/2022 23:07   DG Abd Portable 1V-Small Bowel  Protocol-Position Verification  Result Date: 06/20/2022 CLINICAL DATA:  Evaluate NG tube EXAM: PORTABLE ABDOMEN - 1 VIEW COMPARISON:  None Available. FINDINGS: The NG tube side port is in the region of the distal stomach. The distal tip is probably in the proximal duodenum. IMPRESSION: The NG tube side port is in the distal stomach. The distal tip is probably in the proximal duodenum. Air-filled dilated loops of small bowel persist but are mildly improved. Electronically Signed   By: Gerome Sam III M.D.   On: 06/20/2022 13:53   DG ABD ACUTE 2+V W 1V CHEST  Result Date: 06/20/2022 CLINICAL DATA:  Final pain. EXAM: DG ABDOMEN ACUTE WITH 1 VIEW CHEST COMPARISON:  May 06, 2022.  June 19, 2022. FINDINGS: Moderately dilated small bowel loops are noted without colonic dilatation. This is concerning for distal small bowel obstruction. No radiopaque calculi or other significant radiographic abnormality is seen. Heart size and mediastinal contours are within normal limits. Both lungs are clear. IMPRESSION: Moderately dilated small bowel loops are noted concerning for distal small bowel obstruction. No acute cardiopulmonary disease. Electronically Signed   By: Lupita Raider M.D.   On: 06/20/2022 08:03   CT ABDOMEN PELVIS W CONTRAST  Result Date: 06/19/2022 CLINICAL DATA:  Vomiting and fatigue for 2 days, initial encounter EXAM: CT ANGIOGRAPHY CHEST CT ABDOMEN AND PELVIS WITH CONTRAST TECHNIQUE: Multidetector CT imaging of the chest was performed using the standard protocol during bolus administration of intravenous contrast. Multiplanar CT image reconstructions and MIPs were obtained to evaluate the vascular anatomy. Multidetector CT  imaging of the abdomen and pelvis was performed using the standard protocol during bolus administration of intravenous contrast. RADIATION DOSE REDUCTION: This exam was performed according to the departmental dose-optimization program which includes automated exposure  control, adjustment of the mA and/or kV according to patient size and/or use of iterative reconstruction technique. CONTRAST:  OMNIPAQUE IOHEXOL 350 MG/ML SOLN COMPARISON:  None Available. FINDINGS: CTA CHEST FINDINGS Cardiovascular: Thoracic aorta and its branches are within normal limits. No cardiac enlargement is seen. The pulmonary artery shows a normal branching pattern without intraluminal filling defect to suggest pulmonary embolism. Mediastinum/Nodes: Thoracic inlet is within normal limits. No hilar or mediastinal adenopathy is noted. The esophagus as visualized is within normal limits. Lungs/Pleura: Mild bibasilar atelectasis is noted. No sizable effusion or focal confluent infiltrate is seen. Musculoskeletal: No chest wall abnormality. No acute or significant osseous findings. Review of the MIP images confirms the above findings. CT ABDOMEN and PELVIS FINDINGS Hepatobiliary: No focal liver abnormality is seen. No gallstones, gallbladder wall thickening, or biliary dilatation. Pancreas: Unremarkable. No pancreatic ductal dilatation or surrounding inflammatory changes. Spleen: Normal in size without focal abnormality. Adrenals/Urinary Tract: Adrenal glands are within normal limits. Kidneys demonstrate a normal enhancement pattern. No renal calculi or obstructive changes are seen. The bladder is decompressed. Stomach/Bowel: Appendix is within normal limits. No obstructive or inflammatory changes of the colon are seen. Stomach is decompressed. There are several mildly dilated loops of small bowel identified in the mid abdomen which appear to involve the distal jejunum and proximal ileum. Some inflammatory wall thickening is seen as well as a focal transition zone deep in the pelvis on image number 88 of series 3. Fecalization of bowel contents is noted proximal to this transition zone consistent with a partial small bowel obstruction. Vascular/Lymphatic: No significant vascular findings are present. No  enlarged abdominal or pelvic lymph nodes. Reproductive: Uterus and bilateral adnexa are unremarkable. Other: Mild free fluid is noted within the pelvis and surrounding the spleen likely reactive in nature to the changes in the small bowel. No free air is seen. No hernia is noted. Musculoskeletal: Mild degenerative changes of the lumbar spine are noted. Review of the MIP images confirms the above findings. IMPRESSION: Dilated loops of mid small bowel with fecalization of bowel contents and wall edema consistent with a mild partial small bowel obstruction. Focal transition zone is noted deep within the pelvis although no mass lesion is seen. Electronically Signed   By: Alcide Clever M.D.   On: 06/19/2022 22:40   CT Angio Chest PE W and/or Wo Contrast  Result Date: 06/19/2022 CLINICAL DATA:  Vomiting and fatigue for 2 days, initial encounter EXAM: CT ANGIOGRAPHY CHEST CT ABDOMEN AND PELVIS WITH CONTRAST TECHNIQUE: Multidetector CT imaging of the chest was performed using the standard protocol during bolus administration of intravenous contrast. Multiplanar CT image reconstructions and MIPs were obtained to evaluate the vascular anatomy. Multidetector CT imaging of the abdomen and pelvis was performed using the standard protocol during bolus administration of intravenous contrast. RADIATION DOSE REDUCTION: This exam was performed according to the departmental dose-optimization program which includes automated exposure control, adjustment of the mA and/or kV according to patient size and/or use of iterative reconstruction technique. CONTRAST:  OMNIPAQUE IOHEXOL 350 MG/ML SOLN COMPARISON:  None Available. FINDINGS: CTA CHEST FINDINGS Cardiovascular: Thoracic aorta and its branches are within normal limits. No cardiac enlargement is seen. The pulmonary artery shows a normal branching pattern without intraluminal filling defect to suggest pulmonary embolism. Mediastinum/Nodes:  Thoracic inlet is within normal  limits. No hilar or mediastinal adenopathy is noted. The esophagus as visualized is within normal limits. Lungs/Pleura: Mild bibasilar atelectasis is noted. No sizable effusion or focal confluent infiltrate is seen. Musculoskeletal: No chest wall abnormality. No acute or significant osseous findings. Review of the MIP images confirms the above findings. CT ABDOMEN and PELVIS FINDINGS Hepatobiliary: No focal liver abnormality is seen. No gallstones, gallbladder wall thickening, or biliary dilatation. Pancreas: Unremarkable. No pancreatic ductal dilatation or surrounding inflammatory changes. Spleen: Normal in size without focal abnormality. Adrenals/Urinary Tract: Adrenal glands are within normal limits. Kidneys demonstrate a normal enhancement pattern. No renal calculi or obstructive changes are seen. The bladder is decompressed. Stomach/Bowel: Appendix is within normal limits. No obstructive or inflammatory changes of the colon are seen. Stomach is decompressed. There are several mildly dilated loops of small bowel identified in the mid abdomen which appear to involve the distal jejunum and proximal ileum. Some inflammatory wall thickening is seen as well as a focal transition zone deep in the pelvis on image number 88 of series 3. Fecalization of bowel contents is noted proximal to this transition zone consistent with a partial small bowel obstruction. Vascular/Lymphatic: No significant vascular findings are present. No enlarged abdominal or pelvic lymph nodes. Reproductive: Uterus and bilateral adnexa are unremarkable. Other: Mild free fluid is noted within the pelvis and surrounding the spleen likely reactive in nature to the changes in the small bowel. No free air is seen. No hernia is noted. Musculoskeletal: Mild degenerative changes of the lumbar spine are noted. Review of the MIP images confirms the above findings. IMPRESSION: Dilated loops of mid small bowel with fecalization of bowel contents and wall  edema consistent with a mild partial small bowel obstruction. Focal transition zone is noted deep within the pelvis although no mass lesion is seen. Electronically Signed   By: Alcide Clever M.D.   On: 06/19/2022 22:40    Anti-infectives: Anti-infectives (From admission, onward)    None       Assessment/Plan: SBO -clinically and radiologically resolved -dc ng, fulls adat -can go home later today if tolerates diet -no surgery indicated  Emelia Loron 06/21/2022

## 2022-06-21 NOTE — Progress Notes (Signed)
PROGRESS NOTE  Megan Manning NGE:[id number] DOB: 09-Feb-1981 DOA: 06/19/2022 PCP: Pcp, No   LOS: 1 day   Brief Narrative / Interim history: 41 year old female with borderline personality disorder, DM2, HTN, hypothyroidism, obesity, who comes into the hospital with abdominal pain, nausea, vomiting for the past 2 days.  She was found to have a small bowel obstruction, surgery was consulted and we are asked to admit.  Subjective / 24h Interval events: Still with nausea this morning. Had a big BM today.  Assesement and Plan: Principal Problem:   SOB (shortness of breath) Active Problems:   Small bowel obstruction (HCC)  Principal problem Small bowel obstruction-General surgery consulted, appreciate input.  She was treated conservatively, starting having bowel movements today, clinically appears that her small bowel obstruction has resolved.  Abdominal x-ray also confirms.  NG tube discontinued today, advance diet to full liquids.  Saw her after she tried a few bites, telling me that she still has abdominal discomfort after eating and nausea.  Active problems Sinus tachycardia-driven by #1.  Continue IV fluids.  TSH was unremarkable.  Resume oral metoprolol today  Acute kidney injury-due to poor p.o. intake, creatinine has now normalized.  Discontinue fluids  DMII -CBG unremarkable, monitor   HTN - not currently on medication other than lasix   Lymphedema -on lasix, AKI resolved, resume Lasix tomorrow   HLD -resume home medications today   Hypothyrodism -resume Synthroid  Borderline personality disorder, schizophrenia-on Invega at home, last dose last week.  Resume Depakote today, continue Ativan  Scheduled Meds:  atorvastatin  20 mg Oral QHS   divalproex  500 mg Oral BID   [START ON 06/22/2022] furosemide  40 mg Oral Daily   heparin  5,000 Units Subcutaneous Q8H   [START ON 06/22/2022] levothyroxine  88 mcg Oral Q0600   LORazepam  0.5 mg Intravenous Q8H   metoprolol succinate   50 mg Oral Daily   pantoprazole  40 mg Oral Daily   prazosin  5 mg Oral QHS   Continuous Infusions:   PRN Meds:.albuterol, fentaNYL (SUBLIMAZE) injection, haloperidol lactate, ondansetron **OR** ondansetron (ZOFRAN) IV  Current Outpatient Medications  Medication Instructions   acetaminophen (TYLENOL) 1,000 mg, Oral, Every 6 hours PRN   albuterol (VENTOLIN HFA) 108 (90 Base) MCG/ACT inhaler 2 puffs, Inhalation, Every 6 hours PRN   atorvastatin (LIPITOR) 20 mg, Oral, Daily at bedtime   Azelastine HCl (ASTEPRO) 0.15 % SOLN 1 spray, Nasal, 2 times daily   divalproex (DEPAKOTE ER) 500 mg, Oral, 2 times daily   furosemide (LASIX) 40 mg, Oral, Daily   gabapentin (NEURONTIN) 300-600 mg, Oral, See admin instructions, Give 1 capsule ( 300mg )  in the morning and 2 (600 mg) capsules at bedtime   haloperidol (HALDOL) 5 mg, 2 times daily   Invega Sustenna 234 mg, Intramuscular, Every 28 days   levothyroxine (SYNTHROID) 88 mcg, Oral, Daily   loratadine (CLARITIN) 10 mg, Oral, Daily   LORazepam (ATIVAN) 1 mg, Oral, Every 8 hours   medroxyPROGESTERone (DEPO-PROVERA) 150 mg, Intramuscular, Every 3 months   metoprolol succinate (TOPROL-XL) 50 mg, Oral, Daily   montelukast (SINGULAIR) 10 mg, Oral, Daily at bedtime   nystatin (MYCOSTATIN/NYSTOP) powder 1 Application, Topical, 2 times daily   Olopatadine HCl 0.2 % SOLN 1 drop, Both Eyes, 2 times daily   omeprazole (PRILOSEC) 20 mg, Oral, Daily   potassium chloride SA (KLOR-CON M) 20 MEQ tablet 20 mEq, Oral, Daily   prazosin (MINIPRESS) 5 mg, Oral, Daily at bedtime   sertraline (  ZOLOFT) 50 mg, Daily   traZODone (DESYREL) 200 mg, Daily at bedtime   Vitamin D (Ergocalciferol) (DRISDOL) 50,000 Units, Oral, Every 7 days    Diet Orders (From admission, onward)     Start     Ordered   06/21/22 0916  Diet full liquid Room service appropriate? Yes; Fluid consistency: Thin  Diet effective now       Question Answer Comment  Room service appropriate? Yes    Fluid consistency: Thin      06/21/22 0916            DVT prophylaxis: heparin injection 5,000 Units Start: 06/20/22 0600   Lab Results  Component Value Date   PLT 213 06/21/2022      Code Status: Full Code  Family Communication: No family at bedside  Status is: Inpatient  Remains inpatient appropriate because: advancing diet  Level of care: Progressive  Consultants:  General surgery  Objective: Vitals:   06/21/22 0001 06/21/22 0300 06/21/22 0500 06/21/22 0745  BP: 112/70 115/74  139/81  Pulse: 88 (!) 121  (!) 114  Resp: 19 20  18   Temp: 98.7 F (37.1 C) 98.5 F (36.9 C)  98.6 F (37 C)  TempSrc: Oral Oral  Oral  SpO2: 90% 90%  93%  Weight:   105.5 kg   Height:        Intake/Output Summary (Last 24 hours) at 06/21/2022 1150 Last data filed at 06/21/2022 06/23/2022 Gross per 24 hour  Intake --  Output 400 ml  Net -400 ml    Wt Readings from Last 3 Encounters:  06/21/22 105.5 kg  12/24/21 121.1 kg  04/07/21 134.3 kg    Examination:  Constitutional: NAD Eyes: lids and conjunctivae normal, no scleral icterus ENMT: mmm Neck: normal, supple Respiratory: clear to auscultation bilaterally, no wheezing, no crackles. Normal respiratory effort.  Cardiovascular: Regular rate and rhythm, no murmurs / rubs / gallops.  Abdomen: soft, no distention, no tenderness. Bowel sounds positive.  Skin: no rashes Neurologic: no focal deficits, equal strength   Data Reviewed: I have independently reviewed following labs and imaging studies   CBC Recent Labs  Lab 06/19/22 1954 06/19/22 2004 06/20/22 0527 06/21/22 0510  WBC 9.4  --  8.2 7.4  HGB 14.4 15.3* 12.2 10.7*  HCT 44.0 45.0 38.5 32.9*  PLT 297  --  247 213  MCV 87.3  --  89.7 87.7  MCH 28.6  --  28.4 28.5  MCHC 32.7  --  31.7 32.5  RDW 12.8  --  12.8 12.8  LYMPHSABS 2.1  --   --   --   MONOABS 0.8  --   --   --   EOSABS 0.0  --   --   --   BASOSABS 0.0  --   --   --      Recent Labs  Lab  06/19/22 0123 06/19/22 1954 06/19/22 2004 06/20/22 0527 06/21/22 0510  NA  --  140 136 137 141  K  --  4.5 4.4 4.2 3.9  CL  --  97* 101 101 104  CO2  --  25  --  24 25  GLUCOSE  --  132* 131* 114* 89  BUN  --  29* 30* 27* 16  CREATININE  --  1.42* 1.40* 1.34* 0.98  CALCIUM  --  9.5  --  8.4* 8.7*  AST  --  20  --   --  13*  ALT  --  11  --   --  9  ALKPHOS  --  45  --   --  34*  BILITOT  --  0.8  --   --  0.5  ALBUMIN  --  3.9  --   --  3.1*  MG  --   --   --  2.3 2.3  LATICACIDVEN  --   --   --   --  0.6  TSH 1.983  --   --   --   --      ------------------------------------------------------------------------------------------------------------------ No results for input(s): "CHOL", "HDL", "LDLCALC", "TRIG", "CHOLHDL", "LDLDIRECT" in the last 72 hours.  Lab Results  Component Value Date   HGBA1C 5.4 02/10/2017   ------------------------------------------------------------------------------------------------------------------ Recent Labs    06/19/22 0123  TSH 1.983     Cardiac Enzymes No results for input(s): "CKMB", "TROPONINI", "MYOGLOBIN" in the last 168 hours.  Invalid input(s): "CK" ------------------------------------------------------------------------------------------------------------------    Component Value Date/Time   BNP 8.0 09/28/2016 1513    CBG: Recent Labs  Lab 06/20/22 0812 06/21/22 0959  GLUCAP 117* 111*     Recent Results (from the past 240 hour(s))  MRSA Next Gen by PCR, Nasal     Status: None   Collection Time: 06/20/22  9:43 PM   Specimen: Nasal Mucosa; Nasal Swab  Result Value Ref Range Status   MRSA by PCR Next Gen NOT DETECTED NOT DETECTED Final    Comment: (NOTE) The GeneXpert MRSA Assay (FDA approved for NASAL specimens only), is one component of a comprehensive MRSA colonization surveillance program. It is not intended to diagnose MRSA infection nor to guide or monitor treatment for MRSA infections. Test performance  is not FDA approved in patients less than 93 years old. Performed at [facility], [address], [place], [place] [address]      Radiology Studies: DG Abd Portable 1V-Small Bowel Obstruction Protocol-initial, 8 hr delay  Result Date: 06/20/2022 CLINICAL DATA:  : small bowel obstruction 8 hr delay Chief complaints: abdominal pain Principal problem: SOB EXAM: PORTABLE ABDOMEN - 1 VIEW COMPARISON:  X-ray abdomen 06/20/2022 1:53 p.m., CT abdomen pelvis 06/19/2022 FINDINGS: Enteric tube overlying the expected region of the gastric lumen/gastric antrum. PO contrast reaches the rectum. Small bowel gaseous dilatation measuring up to 4 cm. No radio-opaque calculi or other significant radiographic abnormality are seen. IMPRESSION: 1. Enteric tube overlying the expected region of the gastric lumen/first portion of duodenum. 2. PO contrast reaches the rectum. 3. Partial small bowel obstruction versus ileus. Electronically Signed   By: Tish Frederickson M.D.   On: 06/20/2022 23:07   DG Abd Portable 1V-Small Bowel Protocol-Position Verification  Result Date: 06/20/2022 CLINICAL DATA:  Evaluate NG tube EXAM: PORTABLE ABDOMEN - 1 VIEW COMPARISON:  None Available. FINDINGS: The NG tube side port is in the region of the distal stomach. The distal tip is probably in the proximal duodenum. IMPRESSION: The NG tube side port is in the distal stomach. The distal tip is probably in the proximal duodenum. Air-filled dilated loops of small bowel persist but are mildly improved. Electronically Signed   By: Gerome Sam III M.D.   On: 06/20/2022 13:53     Pamella Pert, MD, PhD [facility]  Between 7 am - 7 pm I am available, please contact me via Amion (for emergencies) or Securechat (non urgent messages)  Between 7 pm - 7 am I am not available, please contact night coverage MD/APP via Amion

## 2022-06-22 DIAGNOSIS — R0602 Shortness of breath: Secondary | ICD-10-CM | POA: Diagnosis not present

## 2022-06-22 LAB — GASTROINTESTINAL PANEL BY PCR, STOOL (REPLACES STOOL CULTURE)

## 2022-06-22 LAB — GLUCOSE, CAPILLARY: Glucose-Capillary: 142 mg/dL — ABNORMAL HIGH (ref 70–99)

## 2022-06-22 MED ORDER — SODIUM CHLORIDE 0.9 % IV BOLUS
500.0000 mL | Freq: Once | INTRAVENOUS | Status: AC
  Administered 2022-06-22: 500 mL via INTRAVENOUS

## 2022-06-22 MED ORDER — ACETAMINOPHEN 325 MG PO TABS
650.0000 mg | ORAL_TABLET | Freq: Four times a day (QID) | ORAL | Status: DC | PRN
  Administered 2022-06-22 – 2022-06-28 (×5): 650 mg via ORAL
  Filled 2022-06-22 (×5): qty 2

## 2022-06-22 NOTE — Progress Notes (Signed)
[name]:   I was notified by RN of overnight episode of hypotension upon rising from a seated to standing position, with systolic blood pressures decreasing into the 70s, associated with dizziness in the absence of syncope or fall.  Upon sitting back down, systolic blood pressures return to greater than 100 and dizziness resolved.  Remained mildly tachycardic throughout.  I ordered a 500 cc normal saline bolus, with order for checking orthostatic vital signs later this morning to further evaluate volume status prior to existing plan to restart Lasix later this morning.  EKG ordered.      Newton Pigg, DO Hospitalist

## 2022-06-22 NOTE — Progress Notes (Signed)
   Subjective/Chief Complaint: Having bowel function, reports some pain and nausea after eating, but no vomiting. Remains on full liquids.   Objective: Vital signs in last 24 hours: Temp:  [98.5 F (36.9 C)-99.4 F (37.4 C)] 99.1 F (37.3 C) (11/27 0409) Pulse Rate:  [89-124] 89 (11/27 0600) Resp:  [14-20] 15 (11/27 0600) BP: (109-137)/(63-90) 130/86 (11/27 0409) SpO2:  [87 %-98 %] 98 % (11/27 0600) Last BM Date : 06/21/22  Intake/Output from previous day: 11/26 0701 - 11/27 0700 In: -  Out: 400 [Emesis/NG output:400] Intake/Output this shift: No intake/output data recorded.  Alert, NAD RRR Abdomen soft, nondistended, nontender to palpation  Lab Results:  Recent Labs    06/20/22 0527 06/21/22 0510  WBC 8.2 7.4  HGB 12.2 10.7*  HCT 38.5 32.9*  PLT 247 213   BMET Recent Labs    06/20/22 0527 06/21/22 0510  NA 137 141  K 4.2 3.9  CL 101 104  CO2 24 25  GLUCOSE 114* 89  BUN 27* 16  CREATININE 1.34* 0.98  CALCIUM 8.4* 8.7*   PT/INR No results for input(s): "LABPROT", "INR" in the last 72 hours. ABG No results for input(s): "PHART", "HCO3" in the last 72 hours.  Invalid input(s): "PCO2", "PO2"  Studies/Results: DG Abd Portable 1V-Small Bowel Obstruction Protocol-initial, 8 hr delay  Result Date: 06/20/2022 CLINICAL DATA:  : small bowel obstruction 8 hr delay Chief complaints: abdominal pain Principal problem: SOB EXAM: PORTABLE ABDOMEN - 1 VIEW COMPARISON:  X-ray abdomen 06/20/2022 1:53 p.m., CT abdomen pelvis 06/19/2022 FINDINGS: Enteric tube overlying the expected region of the gastric lumen/gastric antrum. PO contrast reaches the rectum. Small bowel gaseous dilatation measuring up to 4 cm. No radio-opaque calculi or other significant radiographic abnormality are seen. IMPRESSION: 1. Enteric tube overlying the expected region of the gastric lumen/first portion of duodenum. 2. PO contrast reaches the rectum. 3. Partial small bowel obstruction versus ileus.  Electronically Signed   By: Tish Frederickson M.D.   On: 06/20/2022 23:07   DG Abd Portable 1V-Small Bowel Protocol-Position Verification  Result Date: 06/20/2022 CLINICAL DATA:  Evaluate NG tube EXAM: PORTABLE ABDOMEN - 1 VIEW COMPARISON:  None Available. FINDINGS: The NG tube side port is in the region of the distal stomach. The distal tip is probably in the proximal duodenum. IMPRESSION: The NG tube side port is in the distal stomach. The distal tip is probably in the proximal duodenum. Air-filled dilated loops of small bowel persist but are mildly improved. Electronically Signed   By: Gerome Sam III M.D.   On: 06/20/2022 13:53    Anti-infectives: Anti-infectives (From admission, onward)    None       Assessment/Plan: SBO -Clinically and radiologically resolved, intermittent pain/nausea not likely related to obstruction. Patient continues to have bowel function. -Advance to soft diet.  Fritzi Mandes 06/22/2022

## 2022-06-22 NOTE — Progress Notes (Signed)
PROGRESS NOTE  Megan Manning:[id number] DOB: December 26, 1980 DOA: 06/19/2022 PCP: Angelica Pou, NP   LOS: 2 days   Brief Narrative / Interim history: 41 year old female with borderline personality disorder, DM2, HTN, hypothyroidism, obesity, who comes into the hospital with abdominal pain, nausea, vomiting for the past 2 days.  She was found to have a small bowel obstruction, surgery was consulted and we are asked to admit.  Subjective / 24h Interval events: Reports significant diarrhea.  Ongoing nausea, unable to eat.  Some abdominal cramping.  Assesement and Plan: Principal Problem:   SOB (shortness of breath) Active Problems:   Small bowel obstruction (HCC)  Principal problem Small bowel obstruction, possible enteritis-General surgery consulted, appreciate input.  She was treated conservatively, clinically and on imaging her bowel obstruction seems to have resolved.  She is having now significant diarrhea, and based on the CT scan it is possible that she may have a degree of enteritis.  Obtain GI pathogen panel.  Supportive treatment, her white count is normal and she is afebrile  Active problems Sinus tachycardia-chronic, but more so now driven by #1.  Continue metoprolol.  TSH was unremarkable.   Acute kidney injury-due to poor p.o. intake, creatinine has now normalized  DMII -CBG unremarkable, monitor  CBG (last 3)  Recent Labs    06/20/22 0812 06/21/22 0959  GLUCAP 117* 111*    HTN - not currently on medication other than lasix   Lymphedema -on lasix, continue to hold Lasix due to diarrhea   HLD -resume home medications today   Hypothyrodism -resume Synthroid  Borderline personality disorder, schizophrenia-on Invega at home, last dose last week.  Resume Depakote, continue Ativan  Scheduled Meds:  atorvastatin  20 mg Oral QHS   divalproex  500 mg Oral BID   furosemide  40 mg Oral Daily   gabapentin  300 mg Oral q morning   And   gabapentin  600 mg Oral QHS    heparin  5,000 Units Subcutaneous Q8H   levothyroxine  88 mcg Oral Q0600   LORazepam  0.5 mg Intravenous Q8H   metoprolol succinate  50 mg Oral Daily   pantoprazole  40 mg Oral Daily   prazosin  5 mg Oral QHS   Continuous Infusions:   PRN Meds:.albuterol, fentaNYL (SUBLIMAZE) injection, haloperidol lactate, ondansetron **OR** ondansetron (ZOFRAN) IV  Current Outpatient Medications  Medication Instructions   acetaminophen (TYLENOL) 1,000 mg, Oral, Every 6 hours PRN   albuterol (VENTOLIN HFA) 108 (90 Base) MCG/ACT inhaler 2 puffs, Inhalation, Every 6 hours PRN   atorvastatin (LIPITOR) 20 mg, Oral, Daily at bedtime   Azelastine HCl (ASTEPRO) 0.15 % SOLN 1 spray, Nasal, 2 times daily   divalproex (DEPAKOTE ER) 500 mg, Oral, 2 times daily   furosemide (LASIX) 40 mg, Oral, Daily   gabapentin (NEURONTIN) 300-600 mg, Oral, See admin instructions, Give 1 capsule ( 300mg )  in the morning and 2 (600 mg) capsules at bedtime   haloperidol (HALDOL) 5 mg, 2 times daily   Invega Sustenna 234 mg, Intramuscular, Every 28 days   levothyroxine (SYNTHROID) 88 mcg, Oral, Daily   loratadine (CLARITIN) 10 mg, Oral, Daily   LORazepam (ATIVAN) 1 mg, Oral, Every 8 hours   medroxyPROGESTERone (DEPO-PROVERA) 150 mg, Intramuscular, Every 3 months   metoprolol succinate (TOPROL-XL) 50 mg, Oral, Daily   montelukast (SINGULAIR) 10 mg, Oral, Daily at bedtime   nystatin (MYCOSTATIN/NYSTOP) powder 1 Application, Topical, 2 times daily   Olopatadine HCl 0.2 % SOLN 1 drop, Both  Eyes, 2 times daily   omeprazole (PRILOSEC) 20 mg, Oral, Daily   potassium chloride SA (KLOR-CON M) 20 MEQ tablet 20 mEq, Oral, Daily   prazosin (MINIPRESS) 5 mg, Oral, Daily at bedtime   sertraline (ZOLOFT) 50 mg, Daily   traZODone (DESYREL) 200 mg, Daily at bedtime   Vitamin D (Ergocalciferol) (DRISDOL) 50,000 Units, Oral, Every 7 days    Diet Orders (From admission, onward)     Start     Ordered   06/22/22 0901  DIET SOFT Room service  appropriate? Yes with Assist; Fluid consistency: Thin  Diet effective now       Question Answer Comment  Room service appropriate? Yes with Assist   Fluid consistency: Thin      06/22/22 0900            DVT prophylaxis: heparin injection 5,000 Units Start: 06/20/22 0600   Lab Results  Component Value Date   PLT 213 06/21/2022      Code Status: Full Code  Family Communication: No family at bedside  Status is: Inpatient  Remains inpatient appropriate because: advancing diet  Level of care: Progressive  Consultants:  General surgery  Objective: Vitals:   06/22/22 0019 06/22/22 0409 06/22/22 0600 06/22/22 0800  BP: (!) 109/90 130/86  117/72  Pulse: (!) 123 (!) 106 89 86  Resp: 19 14 15  (!) 22  Temp:  99.1 F (37.3 C)  99 F (37.2 C)  TempSrc:  Axillary  Axillary  SpO2: 93% 95% 98% 97%  Weight:      Height:       No intake or output data in the 24 hours ending 06/22/22 0959  Wt Readings from Last 3 Encounters:  06/21/22 105.5 kg  12/24/21 121.1 kg  04/07/21 134.3 kg    Examination:  Constitutional: NAD Eyes: lids and conjunctivae normal, no scleral icterus ENMT: mmm Neck: normal, supple Respiratory: clear to auscultation bilaterally, no wheezing, no crackles. Normal respiratory effort.  Cardiovascular: Regular rate and rhythm, no murmurs / rubs / gallops. No LE edema. Abdomen: Mild tenderness, no guarding or rebound Skin: no rashes Neurologic: no focal deficits, equal strength   Data Reviewed: I have independently reviewed following labs and imaging studies   CBC Recent Labs  Lab 06/19/22 1954 06/19/22 2004 06/20/22 0527 06/21/22 0510  WBC 9.4  --  8.2 7.4  HGB 14.4 15.3* 12.2 10.7*  HCT 44.0 45.0 38.5 32.9*  PLT 297  --  247 213  MCV 87.3  --  89.7 87.7  MCH 28.6  --  28.4 28.5  MCHC 32.7  --  31.7 32.5  RDW 12.8  --  12.8 12.8  LYMPHSABS 2.1  --   --   --   MONOABS 0.8  --   --   --   EOSABS 0.0  --   --   --   BASOSABS 0.0  --    --   --      Recent Labs  Lab 06/19/22 0123 06/19/22 1954 06/19/22 2004 06/20/22 0527 06/21/22 0510 06/21/22 1129  NA  --  140 136 137 141  --   K  --  4.5 4.4 4.2 3.9  --   CL  --  97* 101 101 104  --   CO2  --  25  --  24 25  --   GLUCOSE  --  132* 131* 114* 89  --   BUN  --  29* 30* 27* 16  --  CREATININE  --  1.42* 1.40* 1.34* 0.98  --   CALCIUM  --  9.5  --  8.4* 8.7*  --   AST  --  20  --   --  13*  --   ALT  --  11  --   --  9  --   ALKPHOS  --  45  --   --  34*  --   BILITOT  --  0.8  --   --  0.5  --   ALBUMIN  --  3.9  --   --  3.1*  --   MG  --   --   --  2.3 2.3  --   LATICACIDVEN  --   --   --   --  0.6 1.9  TSH 1.983  --   --   --   --   --      ------------------------------------------------------------------------------------------------------------------ No results for input(s): "CHOL", "HDL", "LDLCALC", "TRIG", "CHOLHDL", "LDLDIRECT" in the last 72 hours.  Lab Results  Component Value Date   HGBA1C 5.4 02/10/2017   ------------------------------------------------------------------------------------------------------------------ No results for input(s): "TSH", "T4TOTAL", "T3FREE", "THYROIDAB" in the last 72 hours.  Invalid input(s): "FREET3"   Cardiac Enzymes No results for input(s): "CKMB", "TROPONINI", "MYOGLOBIN" in the last 168 hours.  Invalid input(s): "CK" ------------------------------------------------------------------------------------------------------------------    Component Value Date/Time   BNP 8.0 09/28/2016 1513    CBG: Recent Labs  Lab 06/20/22 0812 06/21/22 0959  GLUCAP 117* 111*     Recent Results (from the past 240 hour(s))  MRSA Next Gen by PCR, Nasal     Status: None   Collection Time: 06/20/22  9:43 PM   Specimen: Nasal Mucosa; Nasal Swab  Result Value Ref Range Status   MRSA by PCR Next Gen NOT DETECTED NOT DETECTED Final    Comment: (NOTE) The GeneXpert MRSA Assay (FDA approved for NASAL specimens  only), is one component of a comprehensive MRSA colonization surveillance program. It is not intended to diagnose MRSA infection nor to guide or monitor treatment for MRSA infections. Test performance is not FDA approved in patients less than 21 years old. Performed at [facility], [address], [place], [place] [address]      Radiology Studies: No results found.   Pamella Pert, MD, PhD [facility]  Between 7 am - 7 pm I am available, please contact me via Amion (for emergencies) or Securechat (non urgent messages)  Between 7 pm - 7 am I am not available, please contact night coverage MD/APP via Amion

## 2022-06-22 NOTE — Progress Notes (Signed)
  Transition of Care [facility]) Screening Note   Patient Details  Name: Megan Manning Date of Birth: 17-May-1981   Transition of Care [facility]) CM/SW Contact:    Harriet Masson, RN Phone Number: 06/22/2022, 7:30 AM    Transition of Care Department [facility]) has reviewed patient and no TOC needs have been identified at this time. We will continue to monitor patient advancement through interdisciplinary progression rounds. If new patient transition needs arise, please place a TOC consult.

## 2022-06-23 ENCOUNTER — Inpatient Hospital Stay (HOSPITAL_COMMUNITY): Payer: Medicaid Other

## 2022-06-23 DIAGNOSIS — R0602 Shortness of breath: Secondary | ICD-10-CM | POA: Diagnosis not present

## 2022-06-23 NOTE — Progress Notes (Signed)
[facility] Progress Note     Subjective: CC-  Feels the same as yesterday. Continues to have abdominal pain and nausea after eating. No emesis with eating but states that she did vomit when she took some pills. BM yesterday and she had a loose stool this morning. GI panel negative.  Objective: Vital signs in last 24 hours: Temp:  [98.2 F (36.8 C)-99.5 F (37.5 C)] 99.5 F (37.5 C) (11/28 0806) Pulse Rate:  [88-124] 94 (11/28 0300) Resp:  [18-30] 25 (11/28 0300) BP: (93-127)/(58-85) 106/64 (11/28 0806) SpO2:  [90 %-95 %] 95 % (11/28 0300) Weight:  [138.3 kg] 138.3 kg (11/28 0500) Last BM Date : 06/21/22  Intake/Output from previous day: 11/27 0701 - 11/28 0700 In: 480 [P.O.:480] Out: -  Intake/Output this shift: No intake/output data recorded.  PE: Gen:  Alert, NAD Abd: soft, ND, NT  Lab Results:  Recent Labs    06/21/22 0510  WBC 7.4  HGB 10.7*  HCT 32.9*  PLT 213   BMET Recent Labs    06/21/22 0510  NA 141  K 3.9  CL 104  CO2 25  GLUCOSE 89  BUN 16  CREATININE 0.98  CALCIUM 8.7*   PT/INR No results for input(s): "LABPROT", "INR" in the last 72 hours. CMP     Component Value Date/Time   NA 141 06/21/2022 0510   K 3.9 06/21/2022 0510   CL 104 06/21/2022 0510   CO2 25 06/21/2022 0510   GLUCOSE 89 06/21/2022 0510   BUN 16 06/21/2022 0510   CREATININE 0.98 06/21/2022 0510   CALCIUM 8.7 (L) 06/21/2022 0510   PROT 5.6 (L) 06/21/2022 0510   ALBUMIN 3.1 (L) 06/21/2022 0510   AST 13 (L) 06/21/2022 0510   ALT 9 06/21/2022 0510   ALKPHOS 34 (L) 06/21/2022 0510   BILITOT 0.5 06/21/2022 0510   GFRNONAA >60 06/21/2022 0510   GFRAA >60 01/10/2020 0132   Lipase     Component Value Date/Time   LIPASE 41 06/19/2022 1954       Studies/Results: No results found.  Anti-infectives: Anti-infectives (From admission, onward)    None        Assessment/Plan  pSBO  - no prior abdominal surgeries  - Having bowel function but also  still with abdominal pain and nausea after PO intake. Will repeat abdominal film today.  ID - none FEN - soft diet VTE - sq heparin Foley - none  I reviewed last 24 h vitals and pain scores, last 48 h intake and output, last 24 h labs and trends, and last 24 h imaging results.    LOS: 3 days    Franne Forts, [facility] 06/23/2022, 10:33 AM Please see Amion for pager number during day hours 7:00am-4:30pm

## 2022-06-23 NOTE — Consult Note (Addendum)
[facility] Consult: 2:17 PM 06/23/2022  LOS: 3 days    Referring Provider: Dr Elvera Lennox  Primary Care Physician:  Angelica Pou, NP Primary Gastroenterologist:  Dr Wyline Mood.      Reason for Consultation:  Diarrhea.  PSBO.     HPI: Megan Manning is a 41 y.o. female.  PMH psychiatric disease of borderline personality.  Mental development delay.  Multiple psychiatric admissions.  GERD.  Hypothyroidism.  Foreign body ingestion (shower curtain clips) and self cutting.  DM 2.  Morbid obesity.   02/2015 colonoscopy with placement of decompression tube.  At [facility].  Diagnosis of sigmoid volvulus later felt not to have volvulus but severe constipation. 03/2015 flexible sigmoidoscopy.  Evaluation rectal bleeding.  2, 2 to 3 mm, polyps in rectum were resected/retrieved.  Suspect hyperplastic polyps.  Granular rectal mucosa, biopsied.  Nonbleeding internal hemorrhoids.  Pathology showed no significant pathologic diagnosis, no active or chronic colitis, no granulomas, no dysplasia, no lymphocytic or collagenous colitis 02/13/2016 EGD.  Dr. Cloyde Reams at [facility].  Unable to retrieve report. 02/20/2016 EGD by Dr. Annabell Howells at [facility].  Unable to retrieve report 04/08/2016 EGD at [facility] after swallowing shower curtain clips.  Endoscopist did not encounter esophageal or gastric foreign body and cysts back to the foreign body had passed through. 12/05/16 colonoscopy.  Dr. Tobi Bastos.  Evaluation of rectal bleeding.  Noted nonbleeding, medium sized, grade 1 internal hemorrhoids.  Study otherwise normal to the terminal ileum. 02/2018 EGD.  Dr. Zettie Cooley at [facility].  For evaluation abdominal pain with unremarkable CT scan.  EGD was normal..    Presented to ED 5 days ago with bloody vomiting, abdominal discomfort intermittent dizziness, fatigue.   Had recently had changes in her medications of unspecified type though Lasix had been adjusted, not clear if up or down titration. During earlier in admission, she did not have bowel movements but for the last at least couple of days she has had frequent loose, brown stools.  She had a rectal pouch applied and today a Flexi-Seal was placed at about 11 AM.  There has not been any stool output to the Flexi-seal since then.  Stool GI panel PCR is negative.  Has not had C. difficile testing.   Nausea better but persists, most commonly after solid food.  Not a lot of vomiting.  Endorses early satiety.  Abdominal pain better but persists.  These problems have been going on for about 3 days prior to presenting to the ED.  Denies dysphagia.  At home she has issues with having to strain to have bowel movements, occasionally after straining will see some minor bleeding on tissue As outpatient she takes omeprazole 20 mg daily.  Not on stool softeners, laxatives.  Other complaints include shortness of breath, headache, dizziness.  06/19/2022 CTAP w contrast: Dilated loops of mid small bowel with fecalized bowel contents and wall edema consistent with mild partial SBO.  This appears to involve the distal jejunum and proximal ileum.  Inflammatory wall thickening at area of focal transition zone deep within the pelvis  but no mass seen.  Stomach, liver, pancreas, biliary duct system unremarkable. Subsequent abdominal films through today reveal persistent air-filled but not significantly dilated small bowel loops and colon suggest residual PSBO or diffuse ileus.  LFTs are not elevated. Hb 10.7 (14.5-15 at arrival).  MCV 87.  WBC 7.4.  Platelets 213 No lab evidence for UTI.  General surgery has been following along.  Over the course of hospitalization she continues to have abdominal pain and postprandial nausea.  Not a lot of emesis though recently threw up oral medications.  Nonbloody BM yesterday and loose stool  this morning.  Patient has a legal guardian ring at "[employer]".  Past Medical History:  Diagnosis Date   Adjustment disorder    Borderline personality disorder (HCC)    Diabetes mellitus without complication (HCC)    Hypertension    Hyperthyroidism    Intellectual disability    Lactic acidosis    Lymphedema    Obesity    Overdose    Pica in adults    Thyroid disease     Past Surgical History:  Procedure Laterality Date   COLONOSCOPY WITH PROPOFOL N/A 12/22/2016   Procedure: COLONOSCOPY WITH PROPOFOL;  Surgeon: Wyline Mood, MD;  Location: [facility] ENDOSCOPY;  Service: Endoscopy;  Laterality: N/A;   NO PAST SURGERIES      Prior to Admission medications   Medication Sig Start Date End Date Taking? Authorizing Provider  acetaminophen (TYLENOL) 500 MG tablet Take 1,000 mg by mouth every 6 (six) hours as needed for moderate pain.   Yes [provider]  albuterol (VENTOLIN HFA) 108 (90 Base) MCG/ACT inhaler Inhale 2 puffs into the lungs every 6 (six) hours as needed for wheezing or shortness of breath.  07/13/19  Yes [provider]  atorvastatin (LIPITOR) 20 MG tablet Take 20 mg by mouth at bedtime. 03/09/21  Yes [provider]  Azelastine HCl (ASTEPRO) 0.15 % SOLN Place 1 spray into the nose 2 (two) times daily.   Yes [provider]  divalproex (DEPAKOTE ER) 500 MG 24 hr tablet Take 500 mg by mouth 2 (two) times daily.   Yes [provider]  furosemide (LASIX) 40 MG tablet Take 1 tablet (40 mg total) by mouth daily. Patient taking differently: Take 60 mg by mouth daily. 05/31/22  Yes Oneta Rack, NP  gabapentin (NEURONTIN) 300 MG capsule Take 300-600 mg by mouth See admin instructions. Give 1 capsule ( )  in the morning and 2 (600 mg) capsules at bedtime   Yes [provider]  INVEGA SUSTENNA 234 MG/1.5ML injection Inject 234 mg into the muscle every 28 (twenty-eight) days.   Yes [provider]  levothyroxine  (SYNTHROID) 88 MCG tablet Take 1 tablet (88 mcg total) by mouth daily. 11/17/18  Yes Lamptey, Britta Mccreedy, MD  loratadine (CLARITIN) 10 MG tablet Take 1 tablet (10 mg total) by mouth daily. 03/16/17  Yes Clapacs, Jackquline Denmark, MD  LORazepam (ATIVAN) 1 MG tablet Take 1 mg by mouth every 8 (eight) hours.   Yes [provider]  medroxyPROGESTERone (DEPO-PROVERA) 150 MG/ML injection Inject 150 mg into the muscle every 3 (three) months. 05/04/22  Yes [provider]  metoprolol succinate (TOPROL-XL) 50 MG 24 hr tablet Take 50 mg by mouth daily.   Yes [provider]  montelukast (SINGULAIR) 10 MG tablet Take 10 mg by mouth at bedtime.   Yes [provider]  nystatin (MYCOSTATIN/NYSTOP) powder Apply 1 Application topically 2 (two) times daily.  Yes [provider]  Olopatadine HCl 0.2 % SOLN Place 1 drop into both eyes in the morning and at bedtime.   Yes [provider]  omeprazole (PRILOSEC) 20 MG capsule Take 1 capsule (20 mg total) by mouth daily. 03/16/17  Yes Clapacs, Jackquline Denmark, MD  potassium chloride SA (KLOR-CON M) 20 MEQ tablet Take 20 mEq by mouth daily.   Yes [provider]  prazosin (MINIPRESS) 5 MG capsule Take 5 mg by mouth at bedtime.   Yes [provider]  Vitamin D, Ergocalciferol, (DRISDOL) 1.25 MG (50000 UNIT) CAPS capsule Take 50,000 Units by mouth every 7 (seven) days. 05/27/22  Yes [provider]  haloperidol (HALDOL) 5 MG tablet Take 5 mg by mouth 2 (two) times daily. Patient not taking: Reported on 06/20/2022    [provider]  sertraline (ZOLOFT) 50 MG tablet Take 50 mg by mouth daily. Patient not taking: Reported on 06/20/2022 12/06/19   [provider]  traZODone (DESYREL) 100 MG tablet Take 200 mg by mouth at bedtime. Patient not taking: Reported on 06/20/2022    [provider]    Scheduled Meds:  atorvastatin  20 mg Oral QHS   divalproex  500 mg Oral BID   gabapentin  300 mg  Oral q morning   And   gabapentin  600 mg Oral QHS   heparin  5,000 Units Subcutaneous Q8H   levothyroxine  88 mcg Oral Q0600   LORazepam  0.5 mg Intravenous Q8H   metoprolol succinate  50 mg Oral Daily   pantoprazole  40 mg Oral Daily   prazosin  5 mg Oral QHS   Infusions:  PRN Meds: acetaminophen, albuterol, fentaNYL (SUBLIMAZE) injection, haloperidol lactate, ondansetron **OR** ondansetron (ZOFRAN) IV   Allergies as of 06/19/2022 - Review Complete 06/19/2022  Allergen Reaction Noted   Chlorpromazine Other (See Comments) 06/29/2011    Family History  Problem Relation Age of Onset   Healthy Mother    Healthy Father    CVA Neg Hx     Social History   Socioeconomic History   Marital status: Single    Spouse name: Not on file   Number of children: Not on file   Years of education: Not on file   Highest education level: Not on file  Occupational History   Not on file  Tobacco Use   Smoking status: Never   Smokeless tobacco: Never  Vaping Use   Vaping Use: Never used  Substance and Sexual Activity   Alcohol use: No   Drug use: No   Sexual activity: Not on file  Other Topics Concern   Not on file  Social History Narrative   Not on file   Social Determinants of Health   Financial Resource Strain: Not on file  Food Insecurity: No Food Insecurity (06/20/2022)   Hunger Vital Sign    Worried About Running Out of Food in the Last Year: Never true    Ran Out of Food in the Last Year: Never true  Transportation Needs: No Transportation Needs (06/20/2022)   PRAPARE - Administrator, Civil Service (Medical): No    Lack of Transportation (Non-Medical): No  Physical Activity: Not on file  Stress: Not on file  Social Connections: Not on file  Intimate Partner Violence: Not At Risk (06/20/2022)   Humiliation, Afraid, Rape, and Kick questionnaire    Fear of Current or Ex-Partner: No    Emotionally Abused: No    Physically Abused: No  Sexually Abused: No     REVIEW OF SYSTEMS: Constitutional: Denies weakness and fatigue but she has not been up and out of bed other than to double over to sit in the bedside lounge chair. ENT:  No nose bleeds Pulm: Endorses shortness of breath without cough. CV:  No palpitations, no LE edema.  No angina. GU:  No hematuria, no frequency GI: See HPI Heme: No unusual bleeding or bruising. Transfusions: None Neuro:  No peripheral tingling or numbness.  No seizures. Derm:  No itching, no rash or sores.  Endocrine:  No sweats or chills.  No polyuria or dysuria Immunization: Reviewed. Travel:  None     PHYSICAL EXAM: Vital signs in last 24 hours: Vitals:   06/23/22 0806 06/23/22 1243  BP: 106/64 136/89  Pulse:    Resp:    Temp: 99.5 F (37.5 C) 97.9 F (36.6 C)  SpO2:     Wt Readings from Last 3 Encounters:  06/23/22 (!) 138.3 kg  12/24/21 121.1 kg  04/07/21 134.3 kg    General: Morbidly obese.  Resting comfortably on the bed.  Alert. Head: No signs of head trauma.  No facial asymmetry or swelling.   Eyes: Conjunctiva pink.  No scleral icterus Ears: No hearing deficit Nose: No congestion or discharge Mouth: Oral mucosa moist, pink, clear. Neck: Large without JVD.  No thyromegaly. Lungs: Clear bilaterally though some upper airway expiratory wheezing.  Slight dyspnea with minor effort such as turning from her back onto her side.  No cough Heart: RRR. Abdomen: Large, soft.  Tinkling/tympanitic bowel sounds.  Diffuse tenderness more so on the right and it is mild without guarding or rebound.  No organomegaly, hernias, bruits appreciated.   Rectal: Visible external rectum looks normal, Flexi-Seal tubing in place with minimal amount of soft to liquid medium brown stool.  Same stool soils the area around the rectum. Musc/Skeltl: No joint redness or swelling. Extremities: Massively obese legs but no edema. Neurologic: Speech is a bit slurred but comprehensible.  Alert.  Appropriate.  Oriented to self,  place.  Moves all 4 limbs without tremor. Skin: Rash, no sores, no telangiectasia. Nodes: No cervical adenopathy Psych: Calm, cooperative.  Intake/Output from previous day: 11/27 0701 - 11/28 0700 In: 480 [P.O.:480] Out: -  Intake/Output this shift: No intake/output data recorded.  LAB RESULTS: Recent Labs    06/21/22 0510  WBC 7.4  HGB 10.7*  HCT 32.9*  PLT 213   BMET Lab Results  Component Value Date   NA 141 06/21/2022   NA 137 06/20/2022   NA 136 06/19/2022   K 3.9 06/21/2022   K 4.2 06/20/2022   K 4.4 06/19/2022   CL 104 06/21/2022   CL 101 06/20/2022   CL 101 06/19/2022   CO2 25 06/21/2022   CO2 24 06/20/2022   CO2 25 06/19/2022   GLUCOSE 89 06/21/2022   GLUCOSE 114 (H) 06/20/2022   GLUCOSE 131 (H) 06/19/2022   BUN 16 06/21/2022   BUN 27 (H) 06/20/2022   BUN 30 (H) 06/19/2022   CREATININE 0.98 06/21/2022   CREATININE 1.34 (H) 06/20/2022   CREATININE 1.40 (H) 06/19/2022   CALCIUM 8.7 (L) 06/21/2022   CALCIUM 8.4 (L) 06/20/2022   CALCIUM 9.5 06/19/2022   LFT Recent Labs    06/21/22 0510  PROT 5.6*  ALBUMIN 3.1*  AST 13*  ALT 9  ALKPHOS 34*  BILITOT 0.5   PT/INR No results found for: "INR", "PROTIME" Hepatitis Panel No results for input(s): "HEPBSAG", "  HCVAB", "HEPAIGM", "HEPBIGM" in the last 72 hours. C-Diff No components found for: "CDIFF" Lipase     Component Value Date/Time   LIPASE 41 06/19/2022 1954    Drugs of Abuse     Component Value Date/Time   LABOPIA NONE DETECTED 01/10/2020 0200   COCAINSCRNUR NONE DETECTED 01/10/2020 0200   COCAINSCRNUR NONE DETECTED 03/04/2017 2015   LABBENZ NONE DETECTED 01/10/2020 0200   AMPHETMU NONE DETECTED 01/10/2020 0200   THCU NONE DETECTED 01/10/2020 0200   LABBARB NONE DETECTED 01/10/2020 0200     RADIOLOGY STUDIES: DG Abd Portable 1V  Result Date: 06/23/2022 CLINICAL DATA:  Small-bowel obstruction. EXAM: PORTABLE ABDOMEN - 1 VIEW COMPARISON:  06/20/2022 FINDINGS: The NG tube is no  longer visualized and was likely removed. The contrast that was seen in the right colon has a vacuo aided. There are air-filled small bowel loops and colon without significant distension. Findings could suggest a residual partial small bowel obstruction or diffuse ileus. IMPRESSION: Persistent air-filled but not significantly dilated small bowel loops and colon suggesting a residual partial small bowel obstruction or diffuse ileus. Electronically Signed   By: Rudie Meyer M.D.   On: 06/23/2022 11:22      IMPRESSION:     PSBO vs ileus.   Sigmoid volvulus vs severe constipation/ileus w flex sig/decompression in 2016.  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  As her bowel movements have resumed, they are liquid.  Stool PCR is negative, has not been checked for C. difficile.    [place] anemia.    Significant burden of psychiatric disease.    PLAN:     Will check for C. difficile.  Suspect this will be negative.  It may be that the patient needs a full on bowel purge with prep and then come up with a bowel regimen that will address her background of constipation.  No clear indication to perform colonoscopy.  This was done in 2018.   Jennye Moccasin  06/23/2022, 2:17 PM Phone [phone]  ------------------------------------------------------------------------------  I have taken a history, reviewed the chart and examined the patient. I performed a substantive portion of this encounter, including complete performance of at least one of the key components, in conjunction with the APP. I agree with the APP's note, impression and recommendations   41 year old female with significant psychiatric history to include history of intentional foreign body ingestions and self cutting with numerous psychiatric admissions, admitted with nausea and vomiting and radiographic evidence of partial small bowel obstruction.   She has a longstanding GI complaints with seem to be more intermittent rather than chronic, persistent.  She has had multiple colonoscopies, most recently in 2018 which was unremarkable.  Her CT scan suggests obstruction as opposed to ileus but her clinical history seems more consistent with an ileus which is resolving.  She has no history of abdominal surgery.  No known Crohn's disease.  However, she was tolerating a regular diet when I saw her this evening.    I do not think a repeat colonoscopy is necessary at this point.  We would not be able to get to the point of interest on the CT scan which seems to be localized to the more proximal ileum.  I think a fecal calprotectin would be helpful to further rule out Crohn's disease as a possible cause of the transition zone seen on the CT scan. If normal, I would favor ileus/intestinal dysmotility as  a cause of her symptoms. Her symptoms seem to be slowly improving and she is currently tolerating a soft diet.  I do not think adding any additional medications at this point is necessary either.  Further evaluation as an outpatient to include CT enterography can be done to further evaluate for focal stricture.  Ileus versus partial SBO - Fecal calprotectin to assess for possible Crohn's disease -- Continue soft diet -- Outpatient follow up; CT enterography -- Will continue to follow   Kenlie Seki E. Tomasa Rand, MD [facility]

## 2022-06-23 NOTE — Progress Notes (Addendum)
PROGRESS NOTE  Megan Manning:[id number] DOB: [birth year] DOA: 06/19/2022 PCP: Angelica Pou, NP   LOS: 3 days   Brief Narrative / Interim history: 41 year old female with borderline personality disorder, DM2, HTN, hypothyroidism, obesity, who comes into the hospital with abdominal pain, nausea, vomiting for the past 2 days.  She was found to have a small bowel obstruction, surgery was consulted and we are asked to admit.  Her bowel obstruction resolved but she has been having persistent large-volume watery diarrhea, and GI consulted today  Subjective / 24h Interval events: Persistent diarrhea this morning, large amounts per RN.  Continues with abdominal cramping.  Denies any prior history of severe diarrhea  Assesement and Plan: Principal Problem:   SOB (shortness of breath) Active Problems:   Small bowel obstruction (HCC)  Principal problem Small bowel obstruction, possible enteritis-General surgery consulted, appreciate input.  She was treated conservatively, clinically and on imaging her bowel obstruction seems to have resolved.  She is having now significant diarrhea, and based on the CT scan it is possible that she may have a degree of enteritis.  GI pathogen panel negative.  Less likely C. difficile given normal WBC -Due to persistent symptoms, GI consulted today, appreciate input.  Active problems Sinus tachycardia-chronic, but more so now driven by #1.  Continue metoprolol.  TSH was unremarkable.  Follows with cardiology as an outpatient  Acute kidney injury-due to poor p.o. intake, creatinine has now normalized, but due to persistent diarrhea continue to hold furosemide  DMII -CBG unremarkable, monitor  CBG (last 3)  Recent Labs    06/21/22 0959 06/22/22 1012  GLUCAP 111* 142*     HTN - not currently on medication other than lasix   Lymphedema -on lasix, continue to hold Lasix due to diarrhea   HLD -continue statin   Hypothyrodism -continue  Synthroid  Borderline personality disorder, schizophrenia-on Invega at home, last dose last week.  Resume Depakote, continue Ativan IV due to intermittent nausea  Scheduled Meds:  atorvastatin  20 mg Oral QHS   divalproex  500 mg Oral BID   gabapentin  300 mg Oral q morning   And   gabapentin  600 mg Oral QHS   heparin  5,000 Units Subcutaneous Q8H   levothyroxine  88 mcg Oral Q0600   LORazepam  0.5 mg Intravenous Q8H   metoprolol succinate  50 mg Oral Daily   pantoprazole  40 mg Oral Daily   prazosin  5 mg Oral QHS   Continuous Infusions:   PRN Meds:.acetaminophen, albuterol, fentaNYL (SUBLIMAZE) injection, haloperidol lactate, ondansetron **OR** ondansetron (ZOFRAN) IV  Current Outpatient Medications  Medication Instructions   acetaminophen (TYLENOL) 1,000 mg, Oral, Every 6 hours PRN   albuterol (VENTOLIN HFA) 108 (90 Base) MCG/ACT inhaler 2 puffs, Inhalation, Every 6 hours PRN   atorvastatin (LIPITOR) 20 mg, Oral, Daily at bedtime   Azelastine HCl (ASTEPRO) 0.15 % SOLN 1 spray, Nasal, 2 times daily   divalproex (DEPAKOTE ER) 500 mg, Oral, 2 times daily   furosemide (LASIX) 40 mg, Oral, Daily   gabapentin (NEURONTIN) 300-600 mg, Oral, See admin instructions, Give 1 capsule ( 300mg )  in the morning and 2 (600 mg) capsules at bedtime   haloperidol (HALDOL) 5 mg, 2 times daily   Invega Sustenna 234 mg, Intramuscular, Every 28 days   levothyroxine (SYNTHROID) 88 mcg, Oral, Daily   loratadine (CLARITIN) 10 mg, Oral, Daily   LORazepam (ATIVAN) 1 mg, Oral, Every 8 hours   medroxyPROGESTERone (DEPO-PROVERA) 150 mg, Intramuscular,  Every 3 months   metoprolol succinate (TOPROL-XL) 50 mg, Oral, Daily   montelukast (SINGULAIR) 10 mg, Oral, Daily at bedtime   nystatin (MYCOSTATIN/NYSTOP) powder 1 Application, Topical, 2 times daily   Olopatadine HCl 0.2 % SOLN 1 drop, Both Eyes, 2 times daily   omeprazole (PRILOSEC) 20 mg, Oral, Daily   potassium chloride SA (KLOR-CON M) 20 MEQ tablet  20 mEq, Oral, Daily   prazosin (MINIPRESS) 5 mg, Oral, Daily at bedtime   sertraline (ZOLOFT) 50 mg, Daily   traZODone (DESYREL) 200 mg, Daily at bedtime   Vitamin D (Ergocalciferol) (DRISDOL) 50,000 Units, Oral, Every 7 days    Diet Orders (From admission, onward)     Start     Ordered   06/22/22 0901  DIET SOFT Room service appropriate? Yes with Assist; Fluid consistency: Thin  Diet effective now       Question Answer Comment  Room service appropriate? Yes with Assist   Fluid consistency: Thin      06/22/22 0900            DVT prophylaxis: heparin injection 5,000 Units Start: 06/20/22 0600   Lab Results  Component Value Date   PLT 213 06/21/2022      Code Status: Full Code  Family Communication: No family at bedside  Status is: Inpatient  Remains inpatient appropriate because: Persistent diarrhea  Level of care: Progressive  Consultants:  General surgery Gastroenterology  Objective: Vitals:   06/22/22 2300 06/23/22 0300 06/23/22 0500 06/23/22 0806  BP: 93/72 109/62  106/64  Pulse: (!) 101 94    Resp: 18 (!) 25    Temp: 98.2 F (36.8 C) 98.2 F (36.8 C)  99.5 F (37.5 C)  TempSrc:  Axillary  Oral  SpO2: 90% 95%    Weight:   (!) 138.3 kg   Height:        Intake/Output Summary (Last 24 hours) at 06/23/2022 1140 Last data filed at 06/22/2022 1350 Gross per 24 hour  Intake 240 ml  Output --  Net 240 ml    Wt Readings from Last 3 Encounters:  06/23/22 (!) 138.3 kg  12/24/21 121.1 kg  04/07/21 134.3 kg   Examination: Constitutional: NAD Eyes: lids and conjunctivae normal, no scleral icterus ENMT: mmm Neck: normal, supple Respiratory: clear to auscultation bilaterally, no wheezing, no crackles. Normal respiratory effort.  Cardiovascular: Regular rate and rhythm, no murmurs / rubs / gallops. No LE edema. Abdomen: soft, no distention, no tenderness. Bowel sounds positive.  Skin: no rashes Neurologic: no focal deficits, equal strength  Data  Reviewed: I have independently reviewed following labs and imaging studies   CBC Recent Labs  Lab 06/19/22 1954 06/19/22 2004 06/20/22 0527 06/21/22 0510  WBC 9.4  --  8.2 7.4  HGB 14.4 15.3* 12.2 10.7*  HCT 44.0 45.0 38.5 32.9*  PLT 297  --  247 213  MCV 87.3  --  89.7 87.7  MCH 28.6  --  28.4 28.5  MCHC 32.7  --  31.7 32.5  RDW 12.8  --  12.8 12.8  LYMPHSABS 2.1  --   --   --   MONOABS 0.8  --   --   --   EOSABS 0.0  --   --   --   BASOSABS 0.0  --   --   --      Recent Labs  Lab 06/19/22 0123 06/19/22 1954 06/19/22 2004 06/20/22 0527 06/21/22 0510 06/21/22 1129  NA  --  140 136  137 141  --   K  --  4.5 4.4 4.2 3.9  --   CL  --  97* 101 101 104  --   CO2  --  25  --  24 25  --   GLUCOSE  --  132* 131* 114* 89  --   BUN  --  29* 30* 27* 16  --   CREATININE  --  1.42* 1.40* 1.34* 0.98  --   CALCIUM  --  9.5  --  8.4* 8.7*  --   AST  --  20  --   --  13*  --   ALT  --  11  --   --  9  --   ALKPHOS  --  45  --   --  34*  --   BILITOT  --  0.8  --   --  0.5  --   ALBUMIN  --  3.9  --   --  3.1*  --   MG  --   --   --  2.3 2.3  --   LATICACIDVEN  --   --   --   --  0.6 1.9  TSH 1.983  --   --   --   --   --      ------------------------------------------------------------------------------------------------------------------ No results for input(s): "CHOL", "HDL", "LDLCALC", "TRIG", "CHOLHDL", "LDLDIRECT" in the last 72 hours.  Lab Results  Component Value Date   HGBA1C 5.4 02/10/2017   ------------------------------------------------------------------------------------------------------------------ No results for input(s): "TSH", "T4TOTAL", "T3FREE", "THYROIDAB" in the last 72 hours.  Invalid input(s): "FREET3"   Cardiac Enzymes No results for input(s): "CKMB", "TROPONINI", "MYOGLOBIN" in the last 168 hours.  Invalid input(s): "CK" ------------------------------------------------------------------------------------------------------------------     Component Value Date/Time   BNP 8.0 09/28/2016 1513    CBG: Recent Labs  Lab 06/20/22 0812 06/21/22 0959 06/22/22 1012  GLUCAP 117* 111* 142*     Recent Results (from the past 240 hour(s))  MRSA Next Gen by PCR, Nasal     Status: None   Collection Time: 06/20/22  9:43 PM   Specimen: Nasal Mucosa; Nasal Swab  Result Value Ref Range Status   MRSA by PCR Next Gen NOT DETECTED NOT DETECTED Final    Comment: (NOTE) The GeneXpert MRSA Assay (FDA approved for NASAL specimens only), is one component of a comprehensive MRSA colonization surveillance program. It is not intended to diagnose MRSA infection nor to guide or monitor treatment for MRSA infections. Test performance is not FDA approved in patients less than 42 years old. Performed at [facility], [address] [place], [place] [address]   Gastrointestinal Panel by PCR , Stool     Status: None   Collection Time: 06/22/22  7:19 AM   Specimen: Stool  Result Value Ref Range Status   Campylobacter species NOT DETECTED NOT DETECTED Final   Plesimonas shigelloides NOT DETECTED NOT DETECTED Final   Salmonella species NOT DETECTED NOT DETECTED Final   Yersinia enterocolitica NOT DETECTED NOT DETECTED Final   Vibrio species NOT DETECTED NOT DETECTED Final   Vibrio cholerae NOT DETECTED NOT DETECTED Final   Enteroaggregative E coli (EAEC) NOT DETECTED NOT DETECTED Final   Enteropathogenic E coli (EPEC) NOT DETECTED NOT DETECTED Final   Enterotoxigenic E coli (ETEC) NOT DETECTED NOT DETECTED Final   Shiga like toxin producing E coli (STEC) NOT DETECTED NOT DETECTED Final   Shigella/Enteroinvasive E coli (EIEC) NOT DETECTED NOT DETECTED Final   Cryptosporidium  NOT DETECTED NOT DETECTED Final   Cyclospora cayetanensis NOT DETECTED NOT DETECTED Final   Entamoeba histolytica NOT DETECTED NOT DETECTED Final   Giardia lamblia NOT DETECTED NOT DETECTED Final   Adenovirus F40/41 NOT DETECTED NOT DETECTED Final   Astrovirus NOT  DETECTED NOT DETECTED Final   Norovirus GI/GII NOT DETECTED NOT DETECTED Final   Rotavirus A NOT DETECTED NOT DETECTED Final   Sapovirus (I, II, IV, and V) NOT DETECTED NOT DETECTED Final    Comment: Performed at [facility], [address] [place], [place] [address]     Radiology Studies: DG Abd Portable 1V  Result Date: 06/23/2022 CLINICAL DATA:  Small-bowel obstruction. EXAM: PORTABLE ABDOMEN - 1 VIEW COMPARISON:  06/20/2022 FINDINGS: The NG tube is no longer visualized and was likely removed. The contrast that was seen in the right colon has a vacuo aided. There are air-filled small bowel loops and colon without significant distension. Findings could suggest a residual partial small bowel obstruction or diffuse ileus. IMPRESSION: Persistent air-filled but not significantly dilated small bowel loops and colon suggesting a residual partial small bowel obstruction or diffuse ileus. Electronically Signed   By: Rudie Meyer M.D.   On: 06/23/2022 11:22     Pamella Pert, MD, PhD [facility]  Between 7 am - 7 pm I am available, please contact me via Amion (for emergencies) or Securechat (non urgent messages)  Between 7 pm - 7 am I am not available, please contact night coverage MD/APP via Amion

## 2022-06-24 DIAGNOSIS — R197 Diarrhea, unspecified: Secondary | ICD-10-CM

## 2022-06-24 DIAGNOSIS — R000 Tachycardia, unspecified: Secondary | ICD-10-CM | POA: Diagnosis not present

## 2022-06-24 DIAGNOSIS — K56609 Unspecified intestinal obstruction, unspecified as to partial versus complete obstruction: Secondary | ICD-10-CM | POA: Diagnosis not present

## 2022-06-24 LAB — MAGNESIUM: Magnesium: 2 mg/dL (ref 1.7–2.4)

## 2022-06-24 LAB — CBC
HCT: 32.6 % — ABNORMAL LOW (ref 36.0–46.0)
Hemoglobin: 10.4 g/dL — ABNORMAL LOW (ref 12.0–15.0)
MCH: 28.5 pg (ref 26.0–34.0)
MCHC: 31.9 g/dL (ref 30.0–36.0)
MCV: 89.3 fL (ref 80.0–100.0)
Platelets: 272 10*3/uL (ref 150–400)
RBC: 3.65 MIL/uL — ABNORMAL LOW (ref 3.87–5.11)
RDW: 12.8 % (ref 11.5–15.5)
WBC: 6.7 10*3/uL (ref 4.0–10.5)
nRBC: 0 % (ref 0.0–0.2)

## 2022-06-24 LAB — COMPREHENSIVE METABOLIC PANEL
ALT: 12 U/L (ref 0–44)
AST: 19 U/L (ref 15–41)
Albumin: 2.8 g/dL — ABNORMAL LOW (ref 3.5–5.0)
Alkaline Phosphatase: 33 U/L — ABNORMAL LOW (ref 38–126)
Anion gap: 9 (ref 5–15)
BUN: 10 mg/dL (ref 6–20)
CO2: 27 mmol/L (ref 22–32)
Calcium: 8.4 mg/dL — ABNORMAL LOW (ref 8.9–10.3)
Chloride: 106 mmol/L (ref 98–111)
Creatinine, Ser: 0.72 mg/dL (ref 0.44–1.00)
GFR, Estimated: >60 mL/min (ref >60)
Glucose, Bld: 90 mg/dL (ref 70–99)
Potassium: 3.8 mmol/L (ref 3.5–5.1)
Sodium: 142 mmol/L (ref 135–145)
Total Bilirubin: 0.2 mg/dL — ABNORMAL LOW (ref 0.3–1.2)
Total Protein: 5.1 g/dL — ABNORMAL LOW (ref 6.5–8.1)

## 2022-06-24 MED ORDER — POLYETHYLENE GLYCOL 3350 17 G PO PACK
17.0000 g | PACK | Freq: Every day | ORAL | Status: DC
  Administered 2022-06-28: 17 g via ORAL
  Filled 2022-06-24 (×2): qty 1

## 2022-06-24 MED ORDER — DICYCLOMINE HCL 10 MG PO CAPS
10.0000 mg | ORAL_CAPSULE | Freq: Four times a day (QID) | ORAL | Status: DC | PRN
  Administered 2022-06-27 – 2022-06-29 (×4): 10 mg via ORAL
  Filled 2022-06-24 (×4): qty 1

## 2022-06-24 NOTE — Progress Notes (Addendum)
Daily Rounding Note  06/24/2022, 2:12 PM  LOS: 4 days   SUBJECTIVE:   Chief complaint:     No stool in flexiseal.  Pt just expelled the flexiseal as she urinated in BS commode.  May have become dislodged as she moved herself from bed to the [facility] commode.   Stools not yet sent for C diff or FCP.    Minimal abd pain, much better than admission.  No N/V.   Eating bulk of solid meals.    OBJECTIVE:         Vital signs in last 24 hours:    Temp:  [98.4 F (36.9 C)-98.9 F (37.2 C)] 98.8 F (37.1 C) (11/29 0918) Pulse Rate:  [106-123] 123 (11/29 1200) Resp:  [18-28] 20 (11/29 1200) BP: (92-139)/(53-79) 139/78 (11/29 1200) SpO2:  [85 %-92 %] 90 % (11/29 1200) Last BM Date : 06/23/22 Filed Weights   06/20/22 0500 06/21/22 0500 06/23/22 0500  Weight: 104.3 kg 105.5 kg (!) 138.3 kg   General: pleasant, cooperative.  Seems more alert.     Heart: RRR Chest: clear bil.  No cough or dyspnea.   Abdomen: obese, soft, ND.  Active BS.  Scant soft brown stool in the flexiseal "donut" and upper tubing.  No stool in collection bag or mid/distal tubing.    Extremities: massive obesity of legs but no edema Neuro/Psych:  appropriate, alert.  Not confused.  Able to get herself off BS commode onto bed w minor assistance.    Intake/Output from previous day: 11/28 0701 - 11/29 0700 In: -  Out: 400 [Urine:400]  Intake/Output this shift: No intake/output data recorded.  Lab Results: Recent Labs    06/24/22 1256  WBC 6.7  HGB 10.4*  HCT 32.6*  PLT 272   BMET Recent Labs    06/24/22 0829  NA 142  K 3.8  CL 106  CO2 27  GLUCOSE 90  BUN 10  CREATININE 0.72  CALCIUM 8.4*   LFT Recent Labs    06/24/22 0829  PROT 5.1*  ALBUMIN 2.8*  AST 19  ALT 12  ALKPHOS 33*  BILITOT 0.2*   PT/INR No results for input(s): "LABPROT", "INR" in the last 72 hours. Hepatitis Panel No results for input(s): "HEPBSAG", "HCVAB", "HEPAIGM",  "HEPBIGM" in the last 72 hours.  Studies/Results: DG Abd Portable 1V  Result Date: 06/23/2022 CLINICAL DATA:  Small-bowel obstruction. EXAM: PORTABLE ABDOMEN - 1 VIEW COMPARISON:  06/20/2022 FINDINGS: The NG tube is no longer visualized and was likely removed. The contrast that was seen in the right colon has a vacuo aided. There are air-filled small bowel loops and colon without significant distension. Findings could suggest a residual partial small bowel obstruction or diffuse ileus. IMPRESSION: Persistent air-filled but not significantly dilated small bowel loops and colon suggesting a residual partial small bowel obstruction or diffuse ileus. Electronically Signed   By: Rudie Meyer M.D.   On: 06/23/2022 11:22    Scheduled Meds:  atorvastatin  20 mg Oral QHS   divalproex  500 mg Oral BID   gabapentin  300 mg Oral q morning   And   gabapentin  600 mg Oral QHS   heparin  5,000 Units Subcutaneous Q8H   levothyroxine  88 mcg Oral Q0600   LORazepam  0.5 mg Intravenous Q8H   metoprolol succinate  50 mg Oral Daily   pantoprazole  40 mg Oral Daily   prazosin  5 mg Oral QHS  Continuous Infusions: PRN Meds:.acetaminophen, albuterol, haloperidol lactate, ondansetron **OR** ondansetron (ZOFRAN) IV   ASSESMENT:     Diarrhea. Abd pain.  PSBO vs ileus.  Fecal calprotectin, C Diff stool studies ordered, not yet collected.   Pain better.  Last 24 hours w minimal stool output so this is not c diff, order was cancelled.  Also unlikely IBD but left FCP order in place.  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  Electrolytes are ok.    PLAN     ? Add bowel regimen?   May benefit from Linzess given chronic constipation at baseline.      Megan Manning  06/24/2022, 2:12 PM Phone [phone]  ------------------------------------------------------------------------------------------  I have taken a  history, reviewed the chart and examined the patient. I performed a substantive portion of this encounter, including complete performance of at least one of the key components, in conjunction with the APP. I agree with the APP's note, impression and recommendations  Patient reports slow improvement in her symptoms.  Continues to have pain, but not nearly as bad as it was when she came to the hospital.  She is tolerating soft diet, no vomiting.  She passed a small amount of stool today.  Her Flexi-Seal was advertently dislodged and has not been replaced.  The patient is asking for something to help her pass stool. As the patient has not passed any significant stool in the past 2 days, but is tolerating p.o. without nausea or vomiting, think it is reasonable to add gentle laxative at this point to help her evacuate.  Patient is not obstructed currently.  - We will start MiraLAX nightly - Continue soft diet - We will add Bentyl as needed for abdominal pain - Follow-up as outpatient for potential CT enterography - Await fecal calprotectin results once able to have bowel movement  Amour Trigg E. Tomasa Rand, MD [facility]

## 2022-06-24 NOTE — Progress Notes (Signed)
PROGRESS NOTE  Megan Manning IZT:[id number] DOB: Jul 23, 1981 DOA: 06/19/2022 PCP: Angelica Pou, NP   LOS: 4 days   Brief Narrative / Interim history:  41 year old female with borderline personality disorder, DM2, HTN, hypothyroidism, obesity, who comes into the hospital with abdominal pain, nausea, vomiting for the past 2 days.  She was found to have a small bowel obstruction, surgery was consulted and we are asked to admit.  Her bowel obstruction resolved but she has been having persistent large-volume watery diarrhea, and GI consulted today  Subjective / 24h Interval events:  She is still reporting diarrhea, abdominal cramping   Assesement and Plan: Principal Problem:   SOB (shortness of breath) Active Problems:   Small bowel obstruction (HCC)  Small bowel obstruction, possible enteritis -General surgery consulted, appreciate input.  She was treated conservatively, clinically and on imaging her bowel obstruction seems to have resolved.  -Currently with significant diarrhea, GI consulted, GI panel was negative.  GI input greatly appreciated, plan for outpatient CT enterography, will send fecal calprotectin  Sinus tachycardia -chronic, but more so now driven by #1.  Continue metoprolol.  TSH was unremarkable.  Follows with cardiology as an outpatient  Acute kidney injury-due to poor p.o. intake, creatinine has now normalized, but due to persistent diarrhea continue to hold furosemide  DMII -CBG unremarkable, monitor  CBG (last 3)  Recent Labs    06/22/22 1012  GLUCAP 142*    HTN - not currently on medication other than lasix   Lymphedema -on lasix, continue to hold Lasix due to diarrhea   HLD -continue statin   Hypothyrodism -continue Synthroid  Borderline personality disorder, schizophrenia-on Invega at home, last dose last week.  Resume Depakote, continue Ativan IV due to intermittent nausea  Scheduled Meds:  atorvastatin  20 mg Oral QHS   divalproex  500 mg Oral  BID   gabapentin  300 mg Oral q morning   And   gabapentin  600 mg Oral QHS   heparin  5,000 Units Subcutaneous Q8H   levothyroxine  88 mcg Oral Q0600   LORazepam  0.5 mg Intravenous Q8H   metoprolol succinate  50 mg Oral Daily   pantoprazole  40 mg Oral Daily   prazosin  5 mg Oral QHS   Continuous Infusions:   PRN Meds:.acetaminophen, albuterol, fentaNYL (SUBLIMAZE) injection, haloperidol lactate, ondansetron **OR** ondansetron (ZOFRAN) IV  Current Outpatient Medications  Medication Instructions   acetaminophen (TYLENOL) 1,000 mg, Oral, Every 6 hours PRN   albuterol (VENTOLIN HFA) 108 (90 Base) MCG/ACT inhaler 2 puffs, Inhalation, Every 6 hours PRN   atorvastatin (LIPITOR) 20 mg, Oral, Daily at bedtime   Azelastine HCl (ASTEPRO) 0.15 % SOLN 1 spray, Nasal, 2 times daily   divalproex (DEPAKOTE ER) 500 mg, Oral, 2 times daily   furosemide (LASIX) 40 mg, Oral, Daily   gabapentin (NEURONTIN) 300-600 mg, Oral, See admin instructions, Give 1 capsule ( 300mg )  in the morning and 2 (600 mg) capsules at bedtime   haloperidol (HALDOL) 5 mg, 2 times daily   Invega Sustenna 234 mg, Intramuscular, Every 28 days   levothyroxine (SYNTHROID) 88 mcg, Oral, Daily   loratadine (CLARITIN) 10 mg, Oral, Daily   LORazepam (ATIVAN) 1 mg, Oral, Every 8 hours   medroxyPROGESTERone (DEPO-PROVERA) 150 mg, Intramuscular, Every 3 months   metoprolol succinate (TOPROL-XL) 50 mg, Oral, Daily   montelukast (SINGULAIR) 10 mg, Oral, Daily at bedtime   nystatin (MYCOSTATIN/NYSTOP) powder 1 Application, Topical, 2 times daily   Olopatadine HCl 0.2 %  SOLN 1 drop, Both Eyes, 2 times daily   omeprazole (PRILOSEC) 20 mg, Oral, Daily   potassium chloride SA (KLOR-CON M) 20 MEQ tablet 20 mEq, Oral, Daily   prazosin (MINIPRESS) 5 mg, Oral, Daily at bedtime   sertraline (ZOLOFT) 50 mg, Daily   traZODone (DESYREL) 200 mg, Daily at bedtime   Vitamin D (Ergocalciferol) (DRISDOL) 50,000 Units, Oral, Every 7 days    Diet  Orders (From admission, onward)     Start     Ordered   06/22/22 0901  DIET SOFT Room service appropriate? Yes with Assist; Fluid consistency: Thin  Diet effective now       Question Answer Comment  Room service appropriate? Yes with Assist   Fluid consistency: Thin      06/22/22 0900            DVT prophylaxis: heparin injection 5,000 Units Start: 06/20/22 0600   Lab Results  Component Value Date   PLT 213 06/21/2022      Code Status: Full Code  Family Communication: No family at bedside  Status is: Inpatient  Remains inpatient appropriate because: Persistent diarrhea  Level of care: Progressive  Consultants:  General surgery Gastroenterology  Objective: Vitals:   06/24/22 0100 06/24/22 0815 06/24/22 0918 06/24/22 1026  BP: (!) 92/53 124/70 113/61 138/63  Pulse: (!) 110 (!) 113 (!) 116 (!) 114  Resp: (!) 28 18 18    Temp: 98.9 F (37.2 C) 98.7 F (37.1 C) 98.8 F (37.1 C)   TempSrc: Oral Oral Oral   SpO2: 91% (!) 85% (!) 89%   Weight:      Height:        Intake/Output Summary (Last 24 hours) at 06/24/2022 1143 Last data filed at 06/23/2022 1644 Gross per 24 hour  Intake --  Output 400 ml  Net -400 ml   Wt Readings from Last 3 Encounters:  06/23/22 (!) 138.3 kg  12/24/21 121.1 kg  04/07/21 134.3 kg   Examination:  Patient is sleepy, but wakes up and answer questions appropriately Symmetrical Chest wall movement, Good air movement bilaterally, CTAB RRR,No Gallops,Rubs or new Murmurs, No Parasternal Heave +ve B.Sounds, Abd Soft, No tenderness, No rebound - guarding or rigidity. No Cyanosis, Clubbing or edema, No new Rash or bruise    Data Reviewed: I have independently reviewed following labs and imaging studies   CBC Recent Labs  Lab 06/19/22 1954 06/19/22 2004 06/20/22 0527 06/21/22 0510  WBC 9.4  --  8.2 7.4  HGB 14.4 15.3* 12.2 10.7*  HCT 44.0 45.0 38.5 32.9*  PLT 297  --  247 213  MCV 87.3  --  89.7 87.7  MCH 28.6  --  28.4  28.5  MCHC 32.7  --  31.7 32.5  RDW 12.8  --  12.8 12.8  LYMPHSABS 2.1  --   --   --   MONOABS 0.8  --   --   --   EOSABS 0.0  --   --   --   BASOSABS 0.0  --   --   --     Recent Labs  Lab 06/19/22 0123 06/19/22 1954 06/19/22 2004 06/20/22 0527 06/21/22 0510 06/21/22 1129 06/24/22 0829  NA  --  140 136 137 141  --  142  K  --  4.5 4.4 4.2 3.9  --  3.8  CL  --  97* 101 101 104  --  106  CO2  --  25  --  24 25  --  27  GLUCOSE  --  132* 131* 114* 89  --  90  BUN  --  29* 30* 27* 16  --  10  CREATININE  --  1.42* 1.40* 1.34* 0.98  --  0.72  CALCIUM  --  9.5  --  8.4* 8.7*  --  8.4*  AST  --  20  --   --  13*  --  19  ALT  --  11  --   --  9  --  12  ALKPHOS  --  45  --   --  34*  --  33*  BILITOT  --  0.8  --   --  0.5  --  0.2*  ALBUMIN  --  3.9  --   --  3.1*  --  2.8*  MG  --   --   --  2.3 2.3  --  2.0  LATICACIDVEN  --   --   --   --  0.6 1.9  --   TSH 1.983  --   --   --   --   --   --     ------------------------------------------------------------------------------------------------------------------ No results for input(s): "CHOL", "HDL", "LDLCALC", "TRIG", "CHOLHDL", "LDLDIRECT" in the last 72 hours.  Lab Results  Component Value Date   HGBA1C 5.4 02/10/2017   ------------------------------------------------------------------------------------------------------------------ No results for input(s): "TSH", "T4TOTAL", "T3FREE", "THYROIDAB" in the last 72 hours.  Invalid input(s): "FREET3"   Cardiac Enzymes No results for input(s): "CKMB", "TROPONINI", "MYOGLOBIN" in the last 168 hours.  Invalid input(s): "CK" ------------------------------------------------------------------------------------------------------------------    Component Value Date/Time   BNP 8.0 09/28/2016 1513    CBG: Recent Labs  Lab 06/20/22 0812 06/21/22 0959 06/22/22 1012  GLUCAP 117* 111* 142*    Recent Results (from the past 240 hour(s))  MRSA Next Gen by PCR, Nasal      Status: None   Collection Time: 06/20/22  9:43 PM   Specimen: Nasal Mucosa; Nasal Swab  Result Value Ref Range Status   MRSA by PCR Next Gen NOT DETECTED NOT DETECTED Final    Comment: (NOTE) The GeneXpert MRSA Assay (FDA approved for NASAL specimens only), is one component of a comprehensive MRSA colonization surveillance program. It is not intended to diagnose MRSA infection nor to guide or monitor treatment for MRSA infections. Test performance is not FDA approved in patients less than 56 years old. Performed at [facility], [address], [place], [place] [address]   Gastrointestinal Panel by PCR , Stool     Status: None   Collection Time: 06/22/22  7:19 AM   Specimen: Stool  Result Value Ref Range Status   Campylobacter species NOT DETECTED NOT DETECTED Final   Plesimonas shigelloides NOT DETECTED NOT DETECTED Final   Salmonella species NOT DETECTED NOT DETECTED Final   Yersinia enterocolitica NOT DETECTED NOT DETECTED Final   Vibrio species NOT DETECTED NOT DETECTED Final   Vibrio cholerae NOT DETECTED NOT DETECTED Final   Enteroaggregative E coli (EAEC) NOT DETECTED NOT DETECTED Final   Enteropathogenic E coli (EPEC) NOT DETECTED NOT DETECTED Final   Enterotoxigenic E coli (ETEC) NOT DETECTED NOT DETECTED Final   Shiga like toxin producing E coli (STEC) NOT DETECTED NOT DETECTED Final   Shigella/Enteroinvasive E coli (EIEC) NOT DETECTED NOT DETECTED Final   Cryptosporidium NOT DETECTED NOT DETECTED Final   Cyclospora cayetanensis NOT DETECTED NOT DETECTED Final   Entamoeba histolytica NOT DETECTED NOT DETECTED Final   Giardia lamblia NOT  DETECTED NOT DETECTED Final   Adenovirus F40/41 NOT DETECTED NOT DETECTED Final   Astrovirus NOT DETECTED NOT DETECTED Final   Norovirus GI/GII NOT DETECTED NOT DETECTED Final   Rotavirus A NOT DETECTED NOT DETECTED Final   Sapovirus (I, II, IV, and V) NOT DETECTED NOT DETECTED Final    Comment: Performed at [facility],  [address] [place], [place] [address]     Radiology Studies: No results found.   Huey Bienenstock MD [facility]  Between 7 am - 7 pm I am available, please contact me via Amion (for emergencies) or Securechat (non urgent messages)  Between 7 pm - 7 am I am not available, please contact night coverage MD/APP via Amion

## 2022-06-25 DIAGNOSIS — R0602 Shortness of breath: Secondary | ICD-10-CM | POA: Diagnosis not present

## 2022-06-25 DIAGNOSIS — E039 Hypothyroidism, unspecified: Secondary | ICD-10-CM | POA: Insufficient documentation

## 2022-06-25 LAB — BASIC METABOLIC PANEL
Anion gap: 9 (ref 5–15)
BUN: 12 mg/dL (ref 6–20)
CO2: 27 mmol/L (ref 22–32)
Calcium: 8.5 mg/dL — ABNORMAL LOW (ref 8.9–10.3)
Chloride: 106 mmol/L (ref 98–111)
Creatinine, Ser: 0.76 mg/dL (ref 0.44–1.00)
GFR, Estimated: >60 mL/min (ref >60)
Glucose, Bld: 102 mg/dL — ABNORMAL HIGH (ref 70–99)
Potassium: 3.8 mmol/L (ref 3.5–5.1)
Sodium: 142 mmol/L (ref 135–145)

## 2022-06-25 LAB — GLUCOSE, CAPILLARY
Glucose-Capillary: 89 mg/dL (ref 70–99)
Glucose-Capillary: 168 mg/dL — ABNORMAL HIGH (ref 70–99)

## 2022-06-25 MED ORDER — MAGNESIUM CITRATE PO SOLN
0.5000 | Freq: Once | ORAL | Status: DC
  Filled 2022-06-25: qty 296

## 2022-06-25 MED ORDER — POLYETHYLENE GLYCOL 3350 17 G PO PACK
34.0000 g | PACK | Freq: Once | ORAL | Status: AC
  Administered 2022-06-25: 34 g via ORAL
  Filled 2022-06-25: qty 2

## 2022-06-25 NOTE — Evaluation (Signed)
Occupational Therapy Evaluation Patient Details Name: MARAKI MACQUARRIE MRN: [id number] DOB: 1981-04-08 Today's Date: 06/25/2022   History of Present Illness 41 yo female with onset of abd pain and constipation was admitted on 11/24 for management of partial SBO.  Has become hypoxic, has expelled a flexiseal and noted AKI with issues of lymphedema.  PMHx:  borderline personality, anxiety, bipolar, DM, HLD, hypothyroidism, HTN, PICA,   Clinical Impression   Patient admitted for the diagnosis above.  PTA she lives at a local group home, has assist with medications, meals and community mobility.  States she would like to have a RW for mobility, and continues to complete her own ADL.  Patient has intellectual disabilities, and is probably close to her baseline.  PT is following for mobility, and encourage staff to aloow her to complete her own ADL sinkside with setup and supervision.  No post acute OT anticipated.  Patient continues to complain of dizziness with mobility, but BP remains stable.        Recommendations for follow up therapy are one component of a multi-disciplinary discharge planning process, led by the attending physician.  Recommendations may be updated based on patient status, additional functional criteria and insurance authorization.   Follow Up Recommendations  No OT follow up     Assistance Recommended at Discharge Intermittent Supervision/Assistance  Patient can return home with the following Assist for transportation;Assistance with cooking/housework;Direct supervision/assist for medications management;Direct supervision/assist for financial management    Functional Status Assessment  Patient has had a recent decline in their functional status and demonstrates the ability to make significant improvements in function in a reasonable and predictable amount of time.  Equipment Recommendations  None recommended by OT    Recommendations for Other Services       Precautions  / Restrictions Precautions Precautions: Fall Precaution Comments: RW baseline per pt Restrictions Weight Bearing Restrictions: No      Mobility Bed Mobility               General bed mobility comments: up in recliner    Transfers Overall transfer level: Modified independent Equipment used: Rolling walker (2 wheels)                      Balance Overall balance assessment: Needs assistance Sitting-balance support: Feet supported Sitting balance-Leahy Scale: Good     Standing balance support: Reliant on assistive device for balance Standing balance-Leahy Scale: Fair                             ADL either performed or assessed with clinical judgement   ADL                                         General ADL Comments: Generalized supervision due to c/o dizziness with standing/mobility     Vision Patient Visual Report: No change from baseline       Perception     Praxis      Pertinent Vitals/Pain Pain Assessment Pain Assessment: No/denies pain Pain Intervention(s): Monitored during session     Hand Dominance Right   Extremity/Trunk Assessment Upper Extremity Assessment Upper Extremity Assessment: Overall WFL for tasks assessed   Lower Extremity Assessment Lower Extremity Assessment: Defer to PT evaluation   Cervical / Trunk Assessment Cervical / Trunk Assessment: Normal   Communication  Communication Communication: No difficulties   Cognition Arousal/Alertness: Awake/alert Behavior During Therapy: Flat affect Overall Cognitive Status: History of cognitive impairments - at baseline                                 General Comments: has intellectual disability at baseline     General Comments    Exercises     Shoulder Instructions      Home Living Family/patient expects to be discharged to:: Group home                                 Additional Comments: lives in two story  group home baseline      Prior Functioning/Environment Prior Level of Function : Needs assist       Physical Assist : Mobility (physical) Mobility (physical): Gait   Mobility Comments: RW for support of lymphedema baseline ADLs Comments: Patient stating group home assists with meals, medication and community mobility.  Patient states she completes her own ADL        OT Problem List: Impaired balance (sitting and/or standing)      OT Treatment/Interventions:      OT Goals(Current goals can be found in the care plan section) Acute Rehab OT Goals Patient Stated Goal: Go home OT Goal Formulation: With patient Time For Goal Achievement: 06/29/22 Potential to Achieve Goals: Good  OT Frequency:      Co-evaluation              AM-PAC OT "6 Clicks" Daily Activity     Outcome Measure Help from another person eating meals?: None Help from another person taking care of personal grooming?: None Help from another person toileting, which includes using toliet, bedpan, or urinal?: A Little Help from another person bathing (including washing, rinsing, drying)?: A Little Help from another person to put on and taking off regular upper body clothing?: None Help from another person to put on and taking off regular lower body clothing?: A Little 6 Click Score: 21   End of Session Equipment Utilized During Treatment: Rolling walker (2 wheels) Nurse Communication: Mobility status  Activity Tolerance: Patient tolerated treatment well Patient left: in chair;with call bell/phone within reach;with chair alarm set  OT Visit Diagnosis: Dizziness and giddiness (R42)                Time: 1441-1501 OT Time Calculation (min): 20 min Charges:  OT General Charges $OT Visit: 1 Visit OT Evaluation $OT Eval Moderate Complexity: 1 Mod  06/25/2022  RP, OTR/L  Acute Rehabilitation Services  Office:  [phone]   Suzanna Obey 06/25/2022, 3:07 PM

## 2022-06-25 NOTE — TOC Progression Note (Signed)
Transition of Care [facility]) - Progression Note    Patient Details  Name: KEMYA SHED MRN: [id number] Date of Birth: December 28, 1980  [facility]) CM/SW Contact  Harriet Masson, RN Phone Number: 06/25/2022, 3:52 PM  Clinical Narrative:     Sherron Monday to [facility] in regards to therapy recommendations.  Maxine Glenn is agreeable for patient to go to OP rehab and selects [facility] on [address]. Referral sent and information placed on AVS. Walker ordered through adapt to be delivered to the home.   Maxine Glenn can transport to OP rehab.   TOC will continue to follow for needs.   Expected Discharge Plan: Group Home Barriers to Discharge: Continued Medical Work up  Expected Discharge Plan and Services Expected Discharge Plan: Group Home                                               Social Determinants of Health (SDOH) Interventions    Readmission Risk Interventions     No data to display

## 2022-06-25 NOTE — Evaluation (Signed)
Physical Therapy Evaluation Patient Details Name: Megan Manning MRN: [id number] DOB: [birth year] Today's Date: 06/25/2022  History of Present Illness  41 yo female with onset of abd pain and constipation was admitted on 11/24 for management of partial SBO.  Has become hypoxic, has expelled a flexiseal and noted AKI with issues of lymphedema.  PMHx:  borderline personality, anxiety, bipolar, DM, HLD, hypothyroidism, HTN, PICA,  Clinical Impression  Pt was seen for initial mobility on RW on side of bed, with some concerns as noted:  pre-standing pulse 112 post 135;  Sats pre-standing 91% and post 77% on room air;  BP post standing 134/82.  Pt is light headed with all mobility and noted no orthostatic BP changes, no pulses in extremely low range but did desat with room air.  Follow acutely for management of vitals with movement, and will take time for stairs as her O2 permits.  Follow acutely for goals of PT.      Recommendations for follow up therapy are one component of a multi-disciplinary discharge planning process, led by the attending physician.  Recommendations may be updated based on patient status, additional functional criteria and insurance authorization.  Follow Up Recommendations Home health PT      Assistance Recommended at Discharge Intermittent Supervision/Assistance  Patient can return home with the following  A little help with walking and/or transfers;A little help with bathing/dressing/bathroom;Assist for transportation;Help with stairs or ramp for entrance    Equipment Recommendations Rolling walker (2 wheels)  Recommendations for Other Services       Functional Status Assessment Patient has had a recent decline in their functional status and demonstrates the ability to make significant improvements in function in a reasonable and predictable amount of time.     Precautions / Restrictions Precautions Precautions: Fall Precaution Comments: RW baseline per  pt Restrictions Weight Bearing Restrictions: No      Mobility  Bed Mobility Overal bed mobility: Modified Independent                  Transfers Overall transfer level: Modified independent Equipment used: Rolling walker (2 wheels)               General transfer comment: places a hand on walker upon standing for steadiness but is not particularly unsteady    Ambulation/Gait Ambulation/Gait assistance: Min guard Gait Distance (Feet): 8 Feet Assistive device: Rolling walker (2 wheels) Gait Pattern/deviations: Step-to pattern Gait velocity: reduced Gait velocity interpretation: <1.31 ft/sec, indicative of household ambulator Pre-gait activities: standing sat and HR ck General Gait Details: sidesteps on side of bed to observe vitals and tolerance, but stopped process over sats and pt complaining of lightheaded feelings  Stairs            Wheelchair Mobility    Modified Rankin (Stroke Patients Only)       Balance Overall balance assessment: Needs assistance Sitting-balance support: Feet supported Sitting balance-Leahy Scale: Fair     Standing balance support: Bilateral upper extremity supported, During functional activity Standing balance-Leahy Scale: Poor                               Pertinent Vitals/Pain Pain Assessment Pain Assessment: Faces Faces Pain Scale: Hurts a little bit Pain Location: abdomen Pain Descriptors / Indicators: Guarding Pain Intervention(s): Monitored during session, Repositioned    Home Living Family/patient expects to be discharged to:: Group home  Additional Comments: lives in two story group home baseline    Prior Function Prior Level of Function : Needs assist       Physical Assist : Mobility (physical) Mobility (physical): Gait   Mobility Comments: RW for support of lymphedema baseline       Hand Dominance   Dominant Hand: Right    Extremity/Trunk Assessment    Upper Extremity Assessment Upper Extremity Assessment: Overall WFL for tasks assessed    Lower Extremity Assessment Lower Extremity Assessment: Overall WFL for tasks assessed    Cervical / Trunk Assessment Cervical / Trunk Assessment: Lordotic (related to body habitus)  Communication   Communication: Expressive difficulties (low volume and somewhat unclear speech)  Cognition Arousal/Alertness: Awake/alert Behavior During Therapy: Flat affect Overall Cognitive Status: History of cognitive impairments - at baseline                                 General Comments: has intellectual disability at baseline        General Comments General comments (skin integrity, edema, etc.): pt seems distracted, requires vc's to redirect her attention to balance esp during sitting in which she suddenly leaned over to the R for no verbalized reason    Exercises     Assessment/Plan    PT Assessment Patient needs continued PT services  PT Problem List Decreased activity tolerance;Decreased balance;Decreased mobility;Decreased cognition;Decreased safety awareness;Cardiopulmonary status limiting activity;Pain;Obesity       PT Treatment Interventions DME instruction;Gait training;Functional mobility training;Stair training;Therapeutic activities;Therapeutic exercise;Balance training;Neuromuscular re-education;Patient/family education    PT Goals (Current goals can be found in the Care Plan section)  Acute Rehab PT Goals Patient Stated Goal: to feel better PT Goal Formulation: With patient Time For Goal Achievement: 07/02/22 Potential to Achieve Goals: Good    Frequency Min 3X/week     Co-evaluation               AM-PAC PT "6 Clicks" Mobility  Outcome Measure Help needed turning from your back to your side while in a flat bed without using bedrails?: None Help needed moving from lying on your back to sitting on the side of a flat bed without using bedrails?: A  Little Help needed moving to and from a bed to a chair (including a wheelchair)?: A Little Help needed standing up from a chair using your arms (e.g., wheelchair or bedside chair)?: A Little Help needed to walk in hospital room?: A Little Help needed climbing 3-5 steps with a railing? : A Lot 6 Click Score: 18    End of Session Equipment Utilized During Treatment: Gait belt Activity Tolerance: Patient limited by fatigue;Treatment limited secondary to medical complications (Comment) Patient left: in bed;with call bell/phone within reach;with bed alarm set Nurse Communication: Mobility status PT Visit Diagnosis: Unsteadiness on feet (R26.81);Difficulty in walking, not elsewhere classified (R26.2);Pain Pain - Right/Left:  (abdomen) Pain - part of body:  (abdomen)    Time: 7867-6720 PT Time Calculation (min) (ACUTE ONLY): 21 min   Charges:   PT Evaluation $PT Eval Moderate Complexity: 1 Mod         Ivar Drape 06/25/2022, 11:59 AM  Samul Dada, PT PhD Acute Rehab Dept. Number: [facility] [id number] and [facility] [phone]

## 2022-06-25 NOTE — Progress Notes (Signed)
PROGRESS NOTE  Megan ComaLashawn N Yingling BJY:[id number] DOB: 02/12/1981 DOA: 06/19/2022 PCP: Angelica PouLucas, Latoya, NP   LOS: 5 days   Brief Narrative / Interim history:  41 year old female with borderline personality disorder, DM2, HTN, hypothyroidism, obesity, who comes into the hospital with abdominal pain, nausea, vomiting for the past 2 days.  She was found to have a small bowel obstruction, surgery was consulted and we are asked to admit.  Her bowel obstruction resolved but she has been having persistent large-volume watery diarrhea, and GI consulted today  Subjective / 24h Interval events:  No further diarrhea yesterday, actually she has been complaining of constipation, but did not want to take her MiraLAX.   Assesement and Plan: Principal Problem:   SOB (shortness of breath) Active Problems:   Diabetes (HCC)   Intellectual disability   SBO (small bowel obstruction) (HCC)   Hypothyroidism  Small bowel obstruction, possible enteritis -General surgery consulted, appreciate input.  She was treated conservatively, clinically and on imaging her bowel obstruction seems to have resolved.  -Did complain of diarrhea, this has resolved, currently on MiraLAX for constipation -GI input greatly appreciated, recommendation for outpatient CT enterography, they will arrange for outpatient, fecal calprotectin is pending  Sinus tachycardia -chronic, but more so now driven by #1.  Continue metoprolol.  TSH was unremarkable.  Follows with cardiology as an outpatient  Acute kidney injury-due to poor p.o. intake, creatinine has now normalized, but due to persistent diarrhea continue to hold furosemide  DMII -CBG unremarkable, monitor  CBG (last 3)  Recent Labs    06/25/22 0809  GLUCAP 89    HTN - not currently on medication other than lasix   Lymphedema -on lasix, continue to hold Lasix due to diarrhea   HLD -continue statin   Hypothyrodism -continue Synthroid  Borderline personality disorder,  schizophrenia-on Invega at home, last dose last week.  Resume Depakote, continue Ativan IV due to intermittent nausea  Scheduled Meds:  atorvastatin  20 mg Oral QHS   divalproex  500 mg Oral BID   gabapentin  300 mg Oral q morning   And   gabapentin  600 mg Oral QHS   heparin  5,000 Units Subcutaneous Q8H   levothyroxine  88 mcg Oral Q0600   LORazepam  0.5 mg Intravenous Q8H   metoprolol succinate  50 mg Oral Daily   pantoprazole  40 mg Oral Daily   polyethylene glycol  17 g Oral QHS   prazosin  5 mg Oral QHS   Continuous Infusions:   PRN Meds:.acetaminophen, albuterol, dicyclomine, haloperidol lactate, ondansetron **OR** ondansetron (ZOFRAN) IV  Current Outpatient Medications  Medication Instructions   acetaminophen (TYLENOL) 1,000 mg, Oral, Every 6 hours PRN   albuterol (VENTOLIN HFA) 108 (90 Base) MCG/ACT inhaler 2 puffs, Inhalation, Every 6 hours PRN   atorvastatin (LIPITOR) 20 mg, Oral, Daily at bedtime   Azelastine HCl (ASTEPRO) 0.15 % SOLN 1 spray, Nasal, 2 times daily   divalproex (DEPAKOTE ER) 500 mg, Oral, 2 times daily   furosemide (LASIX) 40 mg, Oral, Daily   gabapentin (NEURONTIN) 300-600 mg, Oral, See admin instructions, Give 1 capsule ( 300mg )  in the morning and 2 (600 mg) capsules at bedtime   haloperidol (HALDOL) 5 mg, 2 times daily   Invega Sustenna 234 mg, Intramuscular, Every 28 days   levothyroxine (SYNTHROID) 88 mcg, Oral, Daily   loratadine (CLARITIN) 10 mg, Oral, Daily   LORazepam (ATIVAN) 1 mg, Oral, Every 8 hours   medroxyPROGESTERone (DEPO-PROVERA) 150 mg, Intramuscular, Every  3 months   metoprolol succinate (TOPROL-XL) 50 mg, Oral, Daily   montelukast (SINGULAIR) 10 mg, Oral, Daily at bedtime   nystatin (MYCOSTATIN/NYSTOP) powder 1 Application, Topical, 2 times daily   Olopatadine HCl 0.2 % SOLN 1 drop, Both Eyes, 2 times daily   omeprazole (PRILOSEC) 20 mg, Oral, Daily   potassium chloride SA (KLOR-CON M) 20 MEQ tablet 20 mEq, Oral, Daily    prazosin (MINIPRESS) 5 mg, Oral, Daily at bedtime   sertraline (ZOLOFT) 50 mg, Daily   traZODone (DESYREL) 200 mg, Daily at bedtime   Vitamin D (Ergocalciferol) (DRISDOL) 50,000 Units, Oral, Every 7 days    Diet Orders (From admission, onward)     Start     Ordered   06/22/22 0901  DIET SOFT Room service appropriate? Yes with Assist; Fluid consistency: Thin  Diet effective now       Question Answer Comment  Room service appropriate? Yes with Assist   Fluid consistency: Thin      06/22/22 0900            DVT prophylaxis: heparin injection 5,000 Units Start: 06/20/22 0600   Lab Results  Component Value Date   PLT 272 06/24/2022      Code Status: Full Code  Family Communication: No family at bedside  Status is: Inpatient  Remains inpatient appropriate because: Persistent diarrhea  Level of care: Progressive  Consultants:  General surgery Gastroenterology  Objective: Vitals:   06/25/22 0500 06/25/22 0819 06/25/22 1112 06/25/22 1142  BP:  134/79 (!) 143/82   Pulse:  98 (!) 114 95  Resp:  20 20 20   Temp:  98.7 F (37.1 C) 98.7 F (37.1 C)   TempSrc:  Oral Oral   SpO2:  98% 92% 91%  Weight: (!) 141.8 kg     Height:        Intake/Output Summary (Last 24 hours) at 06/25/2022 1532 Last data filed at 06/25/2022 1432 Gross per 24 hour  Intake 960 ml  Output --  Net 960 ml   Wt Readings from Last 3 Encounters:  06/25/22 (!) 141.8 kg  12/24/21 121.1 kg  04/07/21 134.3 kg   Examination:   Is awake, alert, no apparent distress Symmetrical Chest wall movement, Good air movement bilaterally, CTAB RRR,No Gallops,Rubs or new Murmurs, No Parasternal Heave +ve B.Sounds, Abd Soft, no tenderness to palpation in epigastric area No Cyanosis, Clubbing or edema, No new Rash or bruise    Data Reviewed: I have independently reviewed following labs and imaging studies   CBC Recent Labs  Lab 06/19/22 1954 06/19/22 2004 06/20/22 0527 06/21/22 0510 06/24/22 1256   WBC 9.4  --  8.2 7.4 6.7  HGB 14.4 15.3* 12.2 10.7* 10.4*  HCT 44.0 45.0 38.5 32.9* 32.6*  PLT 297  --  247 213 272  MCV 87.3  --  89.7 87.7 89.3  MCH 28.6  --  28.4 28.5 28.5  MCHC 32.7  --  31.7 32.5 31.9  RDW 12.8  --  12.8 12.8 12.8  LYMPHSABS 2.1  --   --   --   --   MONOABS 0.8  --   --   --   --   EOSABS 0.0  --   --   --   --   BASOSABS 0.0  --   --   --   --     Recent Labs  Lab 06/19/22 0123 06/19/22 1954 06/19/22 1954 06/19/22 2004 06/20/22 06/22/22 06/21/22 0510 06/21/22 1129 06/24/22 06/26/22  06/25/22 0445  NA  --  140   < > 136 137 141  --  142 142  K  --  4.5   < > 4.4 4.2 3.9  --  3.8 3.8  CL  --  97*   < > 101 101 104  --  106 106  CO2  --  25  --   --  24 25  --  27 27  GLUCOSE  --  132*   < > 131* 114* 89  --  90 102*  BUN  --  29*   < > 30* 27* 16  --  10 12  CREATININE  --  1.42*   < > 1.40* 1.34* 0.98  --  0.72 0.76  CALCIUM  --  9.5  --   --  8.4* 8.7*  --  8.4* 8.5*  AST  --  20  --   --   --  13*  --  19  --   ALT  --  11  --   --   --  9  --  12  --   ALKPHOS  --  45  --   --   --  34*  --  33*  --   BILITOT  --  0.8  --   --   --  0.5  --  0.2*  --   ALBUMIN  --  3.9  --   --   --  3.1*  --  2.8*  --   MG  --   --   --   --  2.3 2.3  --  2.0  --   LATICACIDVEN  --   --   --   --   --  0.6 1.9  --   --   TSH 1.983  --   --   --   --   --   --   --   --    < > = values in this interval not displayed.    ------------------------------------------------------------------------------------------------------------------ No results for input(s): "CHOL", "HDL", "LDLCALC", "TRIG", "CHOLHDL", "LDLDIRECT" in the last 72 hours.  Lab Results  Component Value Date   HGBA1C 5.4 02/10/2017   ------------------------------------------------------------------------------------------------------------------ No results for input(s): "TSH", "T4TOTAL", "T3FREE", "THYROIDAB" in the last 72 hours.  Invalid input(s): "FREET3"   Cardiac Enzymes No results for  input(s): "CKMB", "TROPONINI", "MYOGLOBIN" in the last 168 hours.  Invalid input(s): "CK" ------------------------------------------------------------------------------------------------------------------    Component Value Date/Time   BNP 8.0 09/28/2016 1513    CBG: Recent Labs  Lab 06/20/22 0812 06/21/22 0959 06/22/22 1012 06/25/22 0809  GLUCAP 117* 111* 142* 89    Recent Results (from the past 240 hour(s))  MRSA Next Gen by PCR, Nasal     Status: None   Collection Time: 06/20/22  9:43 PM   Specimen: Nasal Mucosa; Nasal Swab  Result Value Ref Range Status   MRSA by PCR Next Gen NOT DETECTED NOT DETECTED Final    Comment: (NOTE) The GeneXpert MRSA Assay (FDA approved for NASAL specimens only), is one component of a comprehensive MRSA colonization surveillance program. It is not intended to diagnose MRSA infection nor to guide or monitor treatment for MRSA infections. Test performance is not FDA approved in patients less than 97 years old. Performed at [facility], [address], [place], [place] [address]   Gastrointestinal Panel by PCR , Stool     Status: None   Collection Time: 06/22/22  7:19 AM   Specimen: Stool  Result Value Ref Range Status   Campylobacter species NOT DETECTED NOT DETECTED Final   Plesimonas shigelloides NOT DETECTED NOT DETECTED Final   Salmonella species NOT DETECTED NOT DETECTED Final   Yersinia enterocolitica NOT DETECTED NOT DETECTED Final   Vibrio species NOT DETECTED NOT DETECTED Final   Vibrio cholerae NOT DETECTED NOT DETECTED Final   Enteroaggregative E coli (EAEC) NOT DETECTED NOT DETECTED Final   Enteropathogenic E coli (EPEC) NOT DETECTED NOT DETECTED Final   Enterotoxigenic E coli (ETEC) NOT DETECTED NOT DETECTED Final   Shiga like toxin producing E coli (STEC) NOT DETECTED NOT DETECTED Final   Shigella/Enteroinvasive E coli (EIEC) NOT DETECTED NOT DETECTED Final   Cryptosporidium NOT DETECTED NOT DETECTED Final    Cyclospora cayetanensis NOT DETECTED NOT DETECTED Final   Entamoeba histolytica NOT DETECTED NOT DETECTED Final   Giardia lamblia NOT DETECTED NOT DETECTED Final   Adenovirus F40/41 NOT DETECTED NOT DETECTED Final   Astrovirus NOT DETECTED NOT DETECTED Final   Norovirus GI/GII NOT DETECTED NOT DETECTED Final   Rotavirus A NOT DETECTED NOT DETECTED Final   Sapovirus (I, II, IV, and V) NOT DETECTED NOT DETECTED Final    Comment: Performed at [facility], [address] [place], [place] [address]     Radiology Studies: No results found.   Huey Bienenstock MD [facility]  Between 7 am - 7 pm I am available, please contact me via Amion (for emergencies) or Securechat (non urgent messages)  Between 7 pm - 7 am I am not available, please contact night coverage MD/APP via Amion

## 2022-06-25 NOTE — Progress Notes (Addendum)
Progress Note  Primary GI: Dr Wyline Mood.    Subjective  Chief Complaint: Diarrhea.  PSBO.   Patient lying in bed stating she had a poor night.  Complaining of abdominal discomfort, nausea without vomiting.  But she did eat breakfast this morning with sausage and eggs. Patient uncertain when her last bowel movement was but per nursing documentation last bowel movement was 11/27 liquid. Abdomen soft.   Objective   Vital signs in last 24 hours: Temp:  [98.3 F (36.8 C)-99.2 F (37.3 C)] 98.7 F (37.1 C) (11/30 0819) Pulse Rate:  [85-123] 98 (11/30 0819) Resp:  [14-25] 20 (11/30 0819) BP: (122-155)/(76-90) 134/79 (11/30 0819) SpO2:  [90 %-98 %] 98 % (11/30 0819) Weight:  [141.8 kg] 141.8 kg (11/30 0500) Last BM Date : 06/23/22 Last BM recorded by nurses in past 5 days Stool Type: Type 7 (Liquid consistency with no solid pieces) (06/22/2022  9:01 AM)  General:   female in no acute distress  Heart:  Regular rate and rhythm; no murmurs Pulm: Clear anteriorly; no wheezing Abdomen:  Soft, Obese AB, Sluggish bowel sounds. mild tenderness in the entire abdomen. Without guarding and Without rebound, No organomegaly appreciated. Extremities:   massively obese without pitting edema Neurologic:  Alert and  oriented x4;  No focal deficits, somnolent.  Psych:  Cooperative. Normal mood and affect.  Intake/Output from previous day: No intake/output data recorded. Intake/Output this shift: No intake/output data recorded.  Studies/Results: No results found.  Lab Results: Recent Labs    06/24/22 1256  WBC 6.7  HGB 10.4*  HCT 32.6*  PLT 272   BMET Recent Labs    06/24/22 0829 06/25/22 0445  NA 142 142  K 3.8 3.8  CL 106 106  CO2 27 27  GLUCOSE 90 102*  BUN 10 12  CREATININE 0.72 0.76  CALCIUM 8.4* 8.5*   LFT Recent Labs    06/24/22 0829  PROT 5.1*  ALBUMIN 2.8*  AST 19  ALT 12  ALKPHOS 33*  BILITOT 0.2*   PT/INR No results for input(s): "LABPROT", "INR" in  the last 72 hours.   Scheduled Meds:  atorvastatin  20 mg Oral QHS   divalproex  500 mg Oral BID   gabapentin  300 mg Oral q morning   And   gabapentin  600 mg Oral QHS   heparin  5,000 Units Subcutaneous Q8H   levothyroxine  88 mcg Oral Q0600   LORazepam  0.5 mg Intravenous Q8H   metoprolol succinate  50 mg Oral Daily   pantoprazole  40 mg Oral Daily   polyethylene glycol  17 g Oral QHS   prazosin  5 mg Oral QHS   Continuous Infusions:    Patient profile:   41 year old female significant gastric history including intentional foreign body ingestion, self cutting numerous psych admissions, admitted with nausea and vomiting radiographic evidence of partial small bowel obstruction.  Longstanding GI complaints multiple colonoscopies most recent 2018 which is unremarkable.    Impression/Plan:   Diarrhea with partial small bowel obstruction versus ileus which has resolved CT suggested obstruction versus post ileus per clinical history is more consistent with ileus which is resolving. Had flex seal yesterday which had a small amount of stool, was not replaced, last documented stool 11/27 Patient unable to provide date of last bowel movement. Cancel C. difficile Pending fecal calprotectin Consider repeat KUB for any worsening symptoms. Patient was placed on miralax to optimize bowel regiment however patient refused last night.  Can continue miralax daily versus consider linzess 145 mcg for constpation and AB pain.   Will need outpatient follow up for CT enterography and FC  [facility] will sign off.  Please contact us if we can be of any further assistance during this hospital stay.   Principal Problem:   SOB (shortness of breath) Active Problems:   SBO (small bowel obstruction) (HCC)    LOS: 5 days   Doree Albee  06/25/2022, 11:18 AM  -------------------------------------------------------------------------------------------------------------------------  I have  taken a history, reviewed the chart and examined the patient. I performed a substantive portion of this encounter, including complete performance of at least one of the key components, in conjunction with the APP. I agree with the APP's note, impression and recommendations  Patient tolerating soft diet which included sausage this morning, still having some mild periumbilical discomfort, but no vomiting.  She has not a significant bowel movement in the past three days (had small small stool yesterday).  Patient apparently refused Miralax last night despite asking me for something to help her have a bowel movement yesterday.  Patient not obstructed, but still not entirely clear whether she a motility problem vs a possible stricture based on her CT.   Low suspicion for Crohn's, but will get fecal calprotectin. Consider outpatient CT enterography based on patient's evolution of symptoms.  Will arrange for outpatient follow up  GI will sign off for now, please reconsult with any concerns/questions  Fabiola Mudgett E. Tomasa Rand, MD [facility]

## 2022-06-26 ENCOUNTER — Inpatient Hospital Stay (HOSPITAL_COMMUNITY): Payer: Medicaid Other

## 2022-06-26 ENCOUNTER — Telehealth: Payer: Self-pay

## 2022-06-26 DIAGNOSIS — K56609 Unspecified intestinal obstruction, unspecified as to partial versus complete obstruction: Secondary | ICD-10-CM | POA: Diagnosis not present

## 2022-06-26 DIAGNOSIS — R0602 Shortness of breath: Secondary | ICD-10-CM | POA: Diagnosis not present

## 2022-06-26 LAB — GLUCOSE, CAPILLARY: Glucose-Capillary: 124 mg/dL — ABNORMAL HIGH (ref 70–99)

## 2022-06-26 MED ORDER — ALPRAZOLAM 0.25 MG PO TABS
0.2500 mg | ORAL_TABLET | Freq: Three times a day (TID) | ORAL | Status: DC | PRN
  Administered 2022-06-27 – 2022-06-28 (×2): 0.25 mg via ORAL
  Filled 2022-06-26 (×2): qty 1

## 2022-06-26 NOTE — Progress Notes (Signed)
Physical Therapy Treatment Patient Details Name: Megan Manning MRN: [id number] DOB: Sep 05, 1980 Today's Date: 06/26/2022   History of Present Illness 41 yo female with onset of abd pain and constipation was admitted on 11/24 for management of partial SBO.  Has become hypoxic, has expelled a flexiseal and noted AKI with issues of lymphedema.  PMHx:  borderline personality, anxiety, bipolar, DM, HLD, hypothyroidism, HTN, PICA,    PT Comments    Received pt sitting in recliner and agreeable to go for walk despite 10/10 pain in abdomen. Pt required a few attempts and min guard to stand from recliner with RW and ambulated 42ft with RW and min guard fading to close supervision. Pt required a few standing rest breaks while ambulating due to SOB/fatigue. SPO2 92% after ambulating and HR 138bpm decreasing to 114bpm with rest and pursed lip breathing. Acute PT to cont to follow.     Recommendations for follow up therapy are one component of a multi-disciplinary discharge planning process, led by the attending physician.  Recommendations may be updated based on patient status, additional functional criteria and insurance authorization.  Follow Up Recommendations  Home health PT     Assistance Recommended at Discharge Intermittent Supervision/Assistance  Patient can return home with the following A little help with walking and/or transfers;A little help with bathing/dressing/bathroom;Assist for transportation;Help with stairs or ramp for entrance;Direct supervision/assist for medications management;Direct supervision/assist for financial management   Equipment Recommendations  Rolling walker (2 wheels)    Recommendations for Other Services       Precautions / Restrictions Precautions Precautions: Fall Precaution Comments: RW baseline per pt Restrictions Weight Bearing Restrictions: No     Mobility  Bed Mobility               General bed mobility comments: up in recliner at start and  end of session Patient Response: Cooperative  Transfers   Equipment used: Rolling walker (2 wheels)               General transfer comment: required a few attempts and min guard to stand from recliner with RW    Ambulation/Gait Ambulation/Gait assistance: Min guard Gait Distance (Feet): 35 Feet Assistive device: Rolling walker (2 wheels) Gait Pattern/deviations: Step-to pattern, Decreased step length - right, Decreased step length - left, Decreased stride length Gait velocity: decreased Gait velocity interpretation: <1.31 ft/sec, indicative of household ambulator   General Gait Details: Pt required multiple standing rest breaks due to fatigue. Ambulates with decreased cadence and decreased bilateral foot clearance.   Stairs             Wheelchair Mobility    Modified Rankin (Stroke Patients Only)       Balance Overall balance assessment: Needs assistance Sitting-balance support: Feet supported Sitting balance-Leahy Scale: Good     Standing balance support: Reliant on assistive device for balance, Bilateral upper extremity supported (RW) Standing balance-Leahy Scale: Poor Standing balance comment: able to maintain static standing balance with close supervision but required min guard for dynamic standing balance                            Cognition Arousal/Alertness: Awake/alert Behavior During Therapy: WFL for tasks assessed/performed Overall Cognitive Status: History of cognitive impairments - at baseline                                 General Comments:  has intellectual disability at baseline        Exercises      General Comments General comments (skin integrity, edema, etc.): HR 138 after ambulating and SPO2 92% -  HR decreased to114bpm with rest      Pertinent Vitals/Pain Pain Assessment Pain Assessment: 0-10 Pain Score: 10-Worst pain ever Pain Location: abdomen Pain Descriptors / Indicators: Discomfort,  Grimacing, Guarding, Heaviness Pain Intervention(s): Limited activity within patient's tolerance, Monitored during session, Premedicated before session, Repositioned    Home Living                          Prior Function            PT Goals (current goals can now be found in the care plan section) Acute Rehab PT Goals Patient Stated Goal: to feel better PT Goal Formulation: With patient Time For Goal Achievement: 07/02/22 Potential to Achieve Goals: Good Progress towards PT goals: Progressing toward goals    Frequency    Min 3X/week      PT Plan Current plan remains appropriate    Co-evaluation              AM-PAC PT "6 Clicks" Mobility   Outcome Measure                   End of Session   Activity Tolerance: Patient limited by fatigue Patient left: in chair;with call bell/phone within reach Nurse Communication: Mobility status PT Visit Diagnosis: Unsteadiness on feet (R26.81);Difficulty in walking, not elsewhere classified (R26.2);Pain;Muscle weakness (generalized) (M62.81) Pain - Right/Left:  (abdomen)     Time: 1914-7829 PT Time Calculation (min) (ACUTE ONLY): 11 min  Charges:  $Gait Training: 8-22 mins                     Raechel Chute PT, DPT  Alfonso Patten 06/26/2022, 11:57 AM

## 2022-06-26 NOTE — Progress Notes (Signed)
Pt is feeling dizzy and lightheaded even at rest. Pt is concerned that her BP is too low for BP medication and wanted to check with MD first. RN messaged MD on call to get clarification.   Megan Manning Megan Manning

## 2022-06-26 NOTE — Progress Notes (Signed)
[name]:   I was notified by RN of the patient's concern regarding taking her scheduled dose of prazosin this evening in the setting of borderline soft blood pressure, with patient reporting some lightheadedness/dizziness, without additional reported acute complaint.   Per my chart review, most recent blood pressure 111/55, which is up slightly from blood pressure in the late afternoon, documented to be 96/46.   I subsequently asked RN to please hold the patient's scheduled dose of prazosin this evening.     Newton Pigg, DO Hospitalist

## 2022-06-26 NOTE — Progress Notes (Signed)
PROGRESS NOTE  Megan Manning EZM:[id number] DOB: 21-Sep-1980 DOA: 06/19/2022 PCP: Angelica Pou, NP   LOS: 6 days   Brief Narrative / Interim history:  41 year old female with borderline personality disorder, DM2, HTN, hypothyroidism, obesity, who comes into the hospital with abdominal pain, nausea, vomiting for the past 2 days.  She was found to have a small bowel obstruction, surgery was consulted and we are asked to admit.  Her bowel obstruction resolved but she has been having persistent large-volume watery diarrhea, and GI consulted today  Subjective / 24h Interval events:  She had bowel movements x 2 yesterday, she ambulated today, she did report some dizziness,   Assesement and Plan: Principal Problem:   SOB (shortness of breath) Active Problems:   Diabetes (HCC)   Intellectual disability   SBO (small bowel obstruction) (HCC)   Hypothyroidism  Small bowel obstruction, possible enteritis -General surgery consulted, appreciate input.  She was treated conservatively, clinically and on imaging her bowel obstruction seems to have resolved.  -Did complain of diarrhea, this has resolved, currently on MiraLAX for constipation -GI input greatly appreciated, recommendation for outpatient CT enterography, they will arrange for outpatient, fecal calprotectin is pending  Sinus tachycardia -chronic, but more so now driven by #1.  Continue metoprolol.  TSH was unremarkable.  Follows with cardiology as an outpatient  Acute kidney injury-due to poor p.o. intake, creatinine has now normalized, but due to persistent diarrhea continue to hold furosemide  DMII -CBG unremarkable, monitor  CBG (last 3)  Recent Labs    06/25/22 0809 06/25/22 1731 06/26/22 1021  GLUCAP 89 168* 124*    HTN - not currently on medication other than lasix   Lymphedema -on lasix, continue to hold Lasix due to diarrhea   HLD -continue statin   Hypothyrodism -continue Synthroid  Borderline personality  disorder, schizophrenia-on Invega at home, last dose last week.  Resume Depakote, continue Ativan IV due to intermittent nausea   Overall patient is improving, no further evidence of SBO, controlled, she is tolerating ambulation, but she remains weak, tachycardic with activity, she will need to get stronger before she is able to go back to her group home.  Scheduled Meds:  atorvastatin  20 mg Oral QHS   divalproex  500 mg Oral BID   gabapentin  300 mg Oral q morning   And   gabapentin  600 mg Oral QHS   heparin  5,000 Units Subcutaneous Q8H   levothyroxine  88 mcg Oral Q0600   magnesium citrate  0.5 Bottle Oral Once   metoprolol succinate  50 mg Oral Daily   pantoprazole  40 mg Oral Daily   polyethylene glycol  17 g Oral QHS   prazosin  5 mg Oral QHS   Continuous Infusions:   PRN Meds:.acetaminophen, albuterol, ALPRAZolam, dicyclomine, haloperidol lactate, ondansetron **OR** ondansetron (ZOFRAN) IV  Current Outpatient Medications  Medication Instructions   acetaminophen (TYLENOL) 1,000 mg, Oral, Every 6 hours PRN   albuterol (VENTOLIN HFA) 108 (90 Base) MCG/ACT inhaler 2 puffs, Inhalation, Every 6 hours PRN   atorvastatin (LIPITOR) 20 mg, Oral, Daily at bedtime   Azelastine HCl (ASTEPRO) 0.15 % SOLN 1 spray, Nasal, 2 times daily   divalproex (DEPAKOTE ER) 500 mg, Oral, 2 times daily   furosemide (LASIX) 40 mg, Oral, Daily   gabapentin (NEURONTIN) 300-600 mg, Oral, See admin instructions, Give 1 capsule ( 300mg )  in the morning and 2 (600 mg) capsules at bedtime   haloperidol (HALDOL) 5 mg, 2 times daily  Invega Sustenna 234 mg, Intramuscular, Every 28 days   levothyroxine (SYNTHROID) 88 mcg, Oral, Daily   loratadine (CLARITIN) 10 mg, Oral, Daily   LORazepam (ATIVAN) 1 mg, Oral, Every 8 hours   medroxyPROGESTERone (DEPO-PROVERA) 150 mg, Intramuscular, Every 3 months   metoprolol succinate (TOPROL-XL) 50 mg, Oral, Daily   montelukast (SINGULAIR) 10 mg, Oral, Daily at bedtime    nystatin (MYCOSTATIN/NYSTOP) powder 1 Application, Topical, 2 times daily   Olopatadine HCl 0.2 % SOLN 1 drop, Both Eyes, 2 times daily   omeprazole (PRILOSEC) 20 mg, Oral, Daily   potassium chloride SA (KLOR-CON M) 20 MEQ tablet 20 mEq, Oral, Daily   prazosin (MINIPRESS) 5 mg, Oral, Daily at bedtime   sertraline (ZOLOFT) 50 mg, Daily   traZODone (DESYREL) 200 mg, Daily at bedtime   Vitamin D (Ergocalciferol) (DRISDOL) 50,000 Units, Oral, Every 7 days    Diet Orders (From admission, onward)     Start     Ordered   06/22/22 0901  DIET SOFT Room service appropriate? Yes with Assist; Fluid consistency: Thin  Diet effective now       Question Answer Comment  Room service appropriate? Yes with Assist   Fluid consistency: Thin      06/22/22 0900            DVT prophylaxis: heparin injection 5,000 Units Start: 06/20/22 0600   Lab Results  Component Value Date   PLT 272 06/24/2022      Code Status: Full Code  Family Communication: No family at bedside  Status is: Inpatient  Remains inpatient appropriate because: Persistent diarrhea  Level of care: Progressive  Consultants:  General surgery Gastroenterology  Objective: Vitals:   06/26/22 0300 06/26/22 0808 06/26/22 1036 06/26/22 1208  BP: 123/77 103/61  118/75  Pulse: 80 (!) 119 (!) 115 (!) 114  Resp: 20 20 (!) 21 20  Temp: 98.4 F (36.9 C) 98.7 F (37.1 C)  98.4 F (36.9 C)  TempSrc: Oral Oral  Oral  SpO2: 92% 92% 90% 90%  Weight:      Height:        Intake/Output Summary (Last 24 hours) at 06/26/2022 1344 Last data filed at 06/26/2022 1200 Gross per 24 hour  Intake 1800 ml  Output --  Net 1800 ml   Wt Readings from Last 3 Encounters:  06/25/22 (!) 141.8 kg  12/24/21 121.1 kg  04/07/21 134.3 kg   Examination:   Is awake, alert, no apparent distress Symmetrical Chest wall movement, Good air movement bilaterally, CTAB RRR,No Gallops,Rubs or new Murmurs, No Parasternal Heave +ve B.Sounds, Abd Soft,  no tenderness to palpation in epigastric area No Cyanosis, Clubbing or edema, No new Rash or bruise    Data Reviewed: I have independently reviewed following labs and imaging studies   CBC Recent Labs  Lab 06/19/22 1954 06/19/22 2004 06/20/22 0527 06/21/22 0510 06/24/22 1256  WBC 9.4  --  8.2 7.4 6.7  HGB 14.4 15.3* 12.2 10.7* 10.4*  HCT 44.0 45.0 38.5 32.9* 32.6*  PLT 297  --  247 213 272  MCV 87.3  --  89.7 87.7 89.3  MCH 28.6  --  28.4 28.5 28.5  MCHC 32.7  --  31.7 32.5 31.9  RDW 12.8  --  12.8 12.8 12.8  LYMPHSABS 2.1  --   --   --   --   MONOABS 0.8  --   --   --   --   EOSABS 0.0  --   --   --   --  BASOSABS 0.0  --   --   --   --     Recent Labs  Lab 06/19/22 1954 06/19/22 2004 06/20/22 0527 06/21/22 0510 06/21/22 1129 06/24/22 0829 06/25/22 0445  NA 140 136 137 141  --  142 142  K 4.5 4.4 4.2 3.9  --  3.8 3.8  CL 97* 101 101 104  --  106 106  CO2 25  --  24 25  --  27 27  GLUCOSE 132* 131* 114* 89  --  90 102*  BUN 29* 30* 27* 16  --  10 12  CREATININE 1.42* 1.40* 1.34* 0.98  --  0.72 0.76  CALCIUM 9.5  --  8.4* 8.7*  --  8.4* 8.5*  AST 20  --   --  13*  --  19  --   ALT 11  --   --  9  --  12  --   ALKPHOS 45  --   --  34*  --  33*  --   BILITOT 0.8  --   --  0.5  --  0.2*  --   ALBUMIN 3.9  --   --  3.1*  --  2.8*  --   MG  --   --  2.3 2.3  --  2.0  --   LATICACIDVEN  --   --   --  0.6 1.9  --   --     ------------------------------------------------------------------------------------------------------------------ No results for input(s): "CHOL", "HDL", "LDLCALC", "TRIG", "CHOLHDL", "LDLDIRECT" in the last 72 hours.  Lab Results  Component Value Date   HGBA1C 5.4 02/10/2017   ------------------------------------------------------------------------------------------------------------------ No results for input(s): "TSH", "T4TOTAL", "T3FREE", "THYROIDAB" in the last 72 hours.  Invalid input(s): "FREET3"   Cardiac Enzymes No results for  input(s): "CKMB", "TROPONINI", "MYOGLOBIN" in the last 168 hours.  Invalid input(s): "CK" ------------------------------------------------------------------------------------------------------------------    Component Value Date/Time   BNP 8.0 09/28/2016 1513    CBG: Recent Labs  Lab 06/21/22 0959 06/22/22 1012 06/25/22 0809 06/25/22 1731 06/26/22 1021  GLUCAP 111* 142* 89 168* 124*    Recent Results (from the past 240 hour(s))  MRSA Next Gen by PCR, Nasal     Status: None   Collection Time: 06/20/22  9:43 PM   Specimen: Nasal Mucosa; Nasal Swab  Result Value Ref Range Status   MRSA by PCR Next Gen NOT DETECTED NOT DETECTED Final    Comment: (NOTE) The GeneXpert MRSA Assay (FDA approved for NASAL specimens only), is one component of a comprehensive MRSA colonization surveillance program. It is not intended to diagnose MRSA infection nor to guide or monitor treatment for MRSA infections. Test performance is not FDA approved in patients less than 48 years old. Performed at [facility], [address], [place], [place] [address]   Gastrointestinal Panel by PCR , Stool     Status: None   Collection Time: 06/22/22  7:19 AM   Specimen: Stool  Result Value Ref Range Status   Campylobacter species NOT DETECTED NOT DETECTED Final   Plesimonas shigelloides NOT DETECTED NOT DETECTED Final   Salmonella species NOT DETECTED NOT DETECTED Final   Yersinia enterocolitica NOT DETECTED NOT DETECTED Final   Vibrio species NOT DETECTED NOT DETECTED Final   Vibrio cholerae NOT DETECTED NOT DETECTED Final   Enteroaggregative E coli (EAEC) NOT DETECTED NOT DETECTED Final   Enteropathogenic E coli (EPEC) NOT DETECTED NOT DETECTED Final   Enterotoxigenic E coli (ETEC) NOT DETECTED NOT DETECTED  Final   Shiga like toxin producing E coli (STEC) NOT DETECTED NOT DETECTED Final   Shigella/Enteroinvasive E coli (EIEC) NOT DETECTED NOT DETECTED Final   Cryptosporidium NOT DETECTED NOT  DETECTED Final   Cyclospora cayetanensis NOT DETECTED NOT DETECTED Final   Entamoeba histolytica NOT DETECTED NOT DETECTED Final   Giardia lamblia NOT DETECTED NOT DETECTED Final   Adenovirus F40/41 NOT DETECTED NOT DETECTED Final   Astrovirus NOT DETECTED NOT DETECTED Final   Norovirus GI/GII NOT DETECTED NOT DETECTED Final   Rotavirus A NOT DETECTED NOT DETECTED Final   Sapovirus (I, II, IV, and V) NOT DETECTED NOT DETECTED Final    Comment: Performed at [facility], [address] [place], [place] [address]     Radiology Studies: DG Chest Port 1 View  Result Date: 06/26/2022 CLINICAL DATA:  Hypoxia EXAM: PORTABLE CHEST 1 VIEW COMPARISON:  Chest radiograph dated 06/20/2022 FINDINGS: Unchanged elevation of the right hemidiaphragm. Right basilar linear opacities. Left basilar patchy opacities. No pleural effusion or pneumothorax. The heart size and mediastinal contours are within normal limits. The visualized skeletal structures are unremarkable. IMPRESSION: Unchanged elevation of right hemidiaphragm. Bibasilar opacities, likely atelectasis. Electronically Signed   By: Agustin Cree M.D.   On: 06/26/2022 08:47     Rylynn Kobs MD [facility]  Between 7 am - 7 pm I am available, please contact me via Amion (for emergencies) or Securechat (non urgent messages)  Between 7 pm - 7 am I am not available, please contact night coverage MD/APP via Amion

## 2022-06-26 NOTE — Telephone Encounter (Signed)
-----   Message from Jenel Lucks, MD sent at 06/25/2022  3:03 PM EST ----- Regarding: hospital follow up apptmt Bonita Quin,  Can you please get Ms. Wadle a follow up appointment with me in the next 4-6 weeks?  Thanks

## 2022-06-26 NOTE — Telephone Encounter (Signed)
Pt scheduled to see Dr. Tomasa Rand 08/06/22 at 8:50am.

## 2022-06-27 DIAGNOSIS — R0602 Shortness of breath: Secondary | ICD-10-CM | POA: Diagnosis not present

## 2022-06-27 DIAGNOSIS — K56609 Unspecified intestinal obstruction, unspecified as to partial versus complete obstruction: Secondary | ICD-10-CM | POA: Diagnosis not present

## 2022-06-27 LAB — CBC
HCT: 29.7 % — ABNORMAL LOW (ref 36.0–46.0)
Hemoglobin: 9.4 g/dL — ABNORMAL LOW (ref 12.0–15.0)
MCH: 28.7 pg (ref 26.0–34.0)
MCHC: 31.6 g/dL (ref 30.0–36.0)
MCV: 90.8 fL (ref 80.0–100.0)
Platelets: 243 10*3/uL (ref 150–400)
RBC: 3.27 MIL/uL — ABNORMAL LOW (ref 3.87–5.11)
RDW: 13.2 % (ref 11.5–15.5)
WBC: 7.3 10*3/uL (ref 4.0–10.5)
nRBC: 0 % (ref 0.0–0.2)

## 2022-06-27 LAB — BASIC METABOLIC PANEL
Anion gap: 8 (ref 5–15)
BUN: 12 mg/dL (ref 6–20)
CO2: 27 mmol/L (ref 22–32)
Calcium: 8.3 mg/dL — ABNORMAL LOW (ref 8.9–10.3)
Chloride: 107 mmol/L (ref 98–111)
Creatinine, Ser: 0.66 mg/dL (ref 0.44–1.00)
GFR, Estimated: >60 mL/min (ref >60)
Glucose, Bld: 96 mg/dL (ref 70–99)
Potassium: 4.2 mmol/L (ref 3.5–5.1)
Sodium: 142 mmol/L (ref 135–145)

## 2022-06-27 LAB — GLUCOSE, CAPILLARY: Glucose-Capillary: 114 mg/dL — ABNORMAL HIGH (ref 70–99)

## 2022-06-27 LAB — CALPROTECTIN, FECAL: Calprotectin, Fecal: 22 ug/g (ref 0–120)

## 2022-06-27 MED ORDER — MAGNESIUM CITRATE PO SOLN
1.0000 | Freq: Once | ORAL | Status: AC
  Administered 2022-06-27: 1 via ORAL
  Filled 2022-06-27: qty 296

## 2022-06-27 NOTE — Progress Notes (Signed)
Physical Therapy Treatment Patient Details Name: Megan Manning MRN: [id number] DOB: 06-01-1981 Today's Date: 06/27/2022   History of Present Illness 41 yo female with onset of abd pain and constipation was admitted on 11/24 for management of partial SBO.  Has become hypoxic, has expelled a flexiseal and noted AKI with issues of lymphedema.  PMHx:  borderline personality, anxiety, bipolar, DM, HLD, hypothyroidism, HTN, PICA,    PT Comments    Pt up in chair and eager to participate with PT. Pt able to progress OOB to hallway before becoming tachycardic [id number]), dizzy, and SOB. Cues for PLB and to slow breathing. Once seated in chair HR decreased to 120BMP. Will continue to follow acutely for mobility progression.    Recommendations for follow up therapy are one component of a multi-disciplinary discharge planning process, led by the attending physician.  Recommendations may be updated based on patient status, additional functional criteria and insurance authorization.  Follow Up Recommendations  Home health PT     Assistance Recommended at Discharge Intermittent Supervision/Assistance  Patient can return home with the following A little help with walking and/or transfers;A little help with bathing/dressing/bathroom;Assist for transportation;Help with stairs or ramp for entrance;Direct supervision/assist for medications management;Direct supervision/assist for financial management   Equipment Recommendations  Rolling walker (2 wheels)    Recommendations for Other Services       Precautions / Restrictions Precautions Precautions: Fall Precaution Comments: RW baseline per pt Restrictions Weight Bearing Restrictions: No     Mobility  Bed Mobility Overal bed mobility: Modified Independent             General bed mobility comments: up in recliner at start and end of session    Transfers Overall transfer level: Modified independent Equipment used: Rolling walker (2 wheels)                General transfer comment: Stood from recliner w/o assist    Ambulation/Gait Ambulation/Gait assistance: Counsellor (Feet): 35 Feet Assistive device: Rolling walker (2 wheels) Gait Pattern/deviations: Step-to pattern, Decreased step length - right, Decreased step length - left, Decreased stride length, Trunk flexed Gait velocity: decreased Gait velocity interpretation: <1.31 ft/sec, indicative of household ambulator   General Gait Details: Pt required multiple standing rest breaks due to fatigue and SOB. Cues for pursed lipped breathing. HR up to 142 with gait Ambulates with decreased cadence and decreased bilateral foot clearance.   Stairs             Wheelchair Mobility    Modified Rankin (Stroke Patients Only)       Balance Overall balance assessment: Needs assistance Sitting-balance support: Feet supported Sitting balance-Leahy Scale: Good     Standing balance support: Reliant on assistive device for balance, Bilateral upper extremity supported (RW) Standing balance-Leahy Scale: Poor Standing balance comment: able to maintain static standing balance with close supervision but required min guard for dynamic standing balance                            Cognition Arousal/Alertness: Awake/alert Behavior During Therapy: WFL for tasks assessed/performed Overall Cognitive Status: History of cognitive impairments - at baseline                                 General Comments: has intellectual disability at baseline        Exercises  General Comments General comments (skin integrity, edema, etc.): HR 142 with ambulation, recovering to 120's with seated rest break. SpO2 at 92% on RA with activity.      Pertinent Vitals/Pain Pain Assessment Pain Assessment: Faces Faces Pain Scale: Hurts a little bit Pain Location: abdomen, L flank Pain Descriptors / Indicators: Discomfort, Grimacing, Guarding,  Heaviness Pain Intervention(s): Monitored during session, Limited activity within patient's tolerance    Home Living                          Prior Function            PT Goals (current goals can now be found in the care plan section) Acute Rehab PT Goals Patient Stated Goal: to feel better PT Goal Formulation: With patient Time For Goal Achievement: 07/02/22 Potential to Achieve Goals: Good Progress towards PT goals: Progressing toward goals    Frequency    Min 3X/week      PT Plan Current plan remains appropriate    Co-evaluation              AM-PAC PT "6 Clicks" Mobility   Outcome Measure  Help needed turning from your back to your side while in a flat bed without using bedrails?: None Help needed moving from lying on your back to sitting on the side of a flat bed without using bedrails?: A Little Help needed moving to and from a bed to a chair (including a wheelchair)?: A Little Help needed standing up from a chair using your arms (e.g., wheelchair or bedside chair)?: A Little Help needed to walk in hospital room?: A Little Help needed climbing 3-5 steps with a railing? : A Lot 6 Click Score: 18    End of Session Equipment Utilized During Treatment: Gait belt Activity Tolerance: Patient limited by fatigue;Treatment limited secondary to medical complications (Comment) (Tachycardic and SOB) Patient left: in chair;with call bell/phone within reach Nurse Communication: Mobility status;Other (comment) (Tachycardia) PT Visit Diagnosis: Unsteadiness on feet (R26.81);Difficulty in walking, not elsewhere classified (R26.2);Pain;Muscle weakness (generalized) (M62.81) Pain - Right/Left:  (abdomen) Pain - part of body:  (abdomen)     Time: 6222-9798 PT Time Calculation (min) (ACUTE ONLY): 16 min  Charges:  $Gait Training: 8-22 mins                     Kallie Locks, PTA Acute Rehab  Sheral Apley 06/27/2022, 10:45 AM

## 2022-06-27 NOTE — Progress Notes (Signed)
Mobility Specialist Progress Note:   06/27/22 1435  Mobility  Activity Ambulated with assistance in hallway  Level of Assistance Contact guard assist, steadying assist  Assistive Device Front wheel walker  Distance Ambulated (ft) 75 ft  Activity Response Tolerated well  Mobility Referral Yes  $Mobility charge 1 Mobility   Pt eager for mobility session. Required only minG assist throughout session. SpO2 >90% on RA. Pt c/o dizziness with incr distance, further mobility deferred. Pt back in bed with all needs met, bed alarm on.     Mobility Specialist Please contact via SecureChat or  Rehab office at [phone]  

## 2022-06-27 NOTE — Progress Notes (Signed)
PROGRESS NOTE  ESHAL PROPPS ZOX:[id number] DOB: 07-03-1981 DOA: 06/19/2022 PCP: Angelica Pou, NP   LOS: 7 days   Brief Narrative / Interim history:  41 year old female with borderline personality disorder, DM2, HTN, hypothyroidism, obesity, who comes into the hospital with abdominal pain, nausea, vomiting for the past 2 days.  She was found to have a small bowel obstruction, surgery was consulted and we are asked to admit.  Her bowel obstruction resolved but she has been having persistent large-volume watery diarrhea, and GI consulted today  Subjective / 24h Interval events:  He had bowel movements yesterday, she ambulated with mobility take yesterday, she does report some mild dizziness. -Blood pressure medicine has been held yesterday due to soft blood pressure.   Assesement and Plan: Principal Problem:   SOB (shortness of breath) Active Problems:   Diabetes (HCC)   Intellectual disability   SBO (small bowel obstruction) (HCC)   Hypothyroidism  Small bowel obstruction, possible enteritis -General surgery consulted, appreciate input.  She was treated conservatively, clinically and on imaging her bowel obstruction seems to have resolved.  -Did complain of diarrhea, this has resolved, currently on MiraLAX for constipation -GI input greatly appreciated, recommendation for outpatient CT enterography, they will arrange for outpatient, fecal calprotectin is pending -Patient had episode of nausea and vomiting yesterday, but tolerating her breakfast and lunch today, she had good BM yesterday, will continue with laxatives, if recurrent episode will repeat GYN.  Sinus tachycardia -chronic, stable course, her blood pressure is soft, I will discontinue prazosin to increase her metoprolol. . -  TSH was unremarkable.  Follows with cardiology as an outpatient  Acute kidney injury-due to poor p.o. intake, creatinine has now normalized, but due to persistent diarrhea continue to hold  furosemide  DMII -CBG unremarkable, monitor  CBG (last 3)  Recent Labs    06/25/22 1731 06/26/22 1021 06/27/22 0834  GLUCAP 168* 124* 114*    HTN - not currently on medication other than lasix   Lymphedema -on lasix, continue to hold Lasix due to diarrhea   HLD -continue statin   Hypothyrodism -continue Synthroid  Hypertension -Blood pressure soft overnight, prazosin has been discontinued, pressure started to increase we will increase her Toprol-XL  Borderline personality disorder, schizophrenia-on Invega at home, last dose last week.  Resume Depakote, continue Ativan IV due to intermittent nausea   Overall patient is improving, no further evidence of SBO, controlled, she is tolerating ambulation, but she remains weak, tachycardic with activity, she will need to get stronger before she is able to go back to her group home.  Scheduled Meds:  atorvastatin  20 mg Oral QHS   divalproex  500 mg Oral BID   gabapentin  300 mg Oral q morning   And   gabapentin  600 mg Oral QHS   heparin  5,000 Units Subcutaneous Q8H   levothyroxine  88 mcg Oral Q0600   magnesium citrate  0.5 Bottle Oral Once   metoprolol succinate  50 mg Oral Daily   pantoprazole  40 mg Oral Daily   polyethylene glycol  17 g Oral QHS   Continuous Infusions:   PRN Meds:.acetaminophen, albuterol, ALPRAZolam, dicyclomine, haloperidol lactate, ondansetron **OR** ondansetron (ZOFRAN) IV  Current Outpatient Medications  Medication Instructions   acetaminophen (TYLENOL) 1,000 mg, Oral, Every 6 hours PRN   albuterol (VENTOLIN HFA) 108 (90 Base) MCG/ACT inhaler 2 puffs, Inhalation, Every 6 hours PRN   atorvastatin (LIPITOR) 20 mg, Oral, Daily at bedtime   Azelastine HCl (ASTEPRO) 0.15 %  SOLN 1 spray, Nasal, 2 times daily   divalproex (DEPAKOTE ER) 500 mg, Oral, 2 times daily   furosemide (LASIX) 40 mg, Oral, Daily   gabapentin (NEURONTIN) 300-600 mg, Oral, See admin instructions, Give 1 capsule ( 300mg )  in the  morning and 2 (600 mg) capsules at bedtime   haloperidol (HALDOL) 5 mg, 2 times daily   Invega Sustenna 234 mg, Intramuscular, Every 28 days   levothyroxine (SYNTHROID) 88 mcg, Oral, Daily   loratadine (CLARITIN) 10 mg, Oral, Daily   LORazepam (ATIVAN) 1 mg, Oral, Every 8 hours   medroxyPROGESTERone (DEPO-PROVERA) 150 mg, Intramuscular, Every 3 months   metoprolol succinate (TOPROL-XL) 50 mg, Oral, Daily   montelukast (SINGULAIR) 10 mg, Oral, Daily at bedtime   nystatin (MYCOSTATIN/NYSTOP) powder 1 Application, Topical, 2 times daily   Olopatadine HCl 0.2 % SOLN 1 drop, Both Eyes, 2 times daily   omeprazole (PRILOSEC) 20 mg, Oral, Daily   potassium chloride SA (KLOR-CON M) 20 MEQ tablet 20 mEq, Oral, Daily   prazosin (MINIPRESS) 5 mg, Oral, Daily at bedtime   sertraline (ZOLOFT) 50 mg, Daily   traZODone (DESYREL) 200 mg, Daily at bedtime   Vitamin D (Ergocalciferol) (DRISDOL) 50,000 Units, Oral, Every 7 days    Diet Orders (From admission, onward)     Start     Ordered   06/22/22 0901  DIET SOFT Room service appropriate? Yes with Assist; Fluid consistency: Thin  Diet effective now       Question Answer Comment  Room service appropriate? Yes with Assist   Fluid consistency: Thin      06/22/22 0900            DVT prophylaxis: heparin injection 5,000 Units Start: 06/20/22 0600   Lab Results  Component Value Date   PLT 243 06/27/2022      Code Status: Full Code  Family Communication: No family at bedside  Status is: Inpatient  Remains inpatient appropriate because: Persistent diarrhea  Level of care: Progressive  Consultants:  General surgery Gastroenterology  Objective: Vitals:   06/26/22 2312 06/27/22 0604 06/27/22 0953 06/27/22 1231  BP: (!) 111/55  (!) 145/86 120/76  Pulse:    (!) 101  Resp:   16 18  Temp:  98.6 F (37 C) 98.6 F (37 C) 98.1 F (36.7 C)  TempSrc:  Oral Oral Oral  SpO2:  91% 96% 91%  Weight:      Height:        Intake/Output  Summary (Last 24 hours) at 06/27/2022 1437 Last data filed at 06/26/2022 1709 Gross per 24 hour  Intake 360 ml  Output --  Net 360 ml   Wt Readings from Last 3 Encounters:  06/25/22 (!) 141.8 kg  12/24/21 121.1 kg  04/07/21 134.3 kg   Examination:   Awake Alert, no apparent distress, impaired judgment and insight Symmetrical Chest wall movement, Good air movement bilaterally, CTAB RRR,No Gallops,Rubs or new Murmurs, No Parasternal Heave +ve B.Sounds, Abd Soft. No Cyanosis, Clubbing or edema, No new Rash or bruise     Data Reviewed: I have independently reviewed following labs and imaging studies   CBC Recent Labs  Lab 06/21/22 0510 06/24/22 1256 06/27/22 0156  WBC 7.4 6.7 7.3  HGB 10.7* 10.4* 9.4*  HCT 32.9* 32.6* 29.7*  PLT 213 272 243  MCV 87.7 89.3 90.8  MCH 28.5 28.5 28.7  MCHC 32.5 31.9 31.6  RDW 12.8 12.8 13.2    Recent Labs  Lab 06/21/22 0510 06/21/22 1129  06/24/22 0829 06/25/22 0445 06/27/22 0156  NA 141  --  142 142 142  K 3.9  --  3.8 3.8 4.2  CL 104  --  106 106 107  CO2 25  --  27 27 27   GLUCOSE 89  --  90 102* 96  BUN 16  --  10 12 12   CREATININE 0.98  --  0.72 0.76 0.66  CALCIUM 8.7*  --  8.4* 8.5* 8.3*  AST 13*  --  19  --   --   ALT 9  --  12  --   --   ALKPHOS 34*  --  33*  --   --   BILITOT 0.5  --  0.2*  --   --   ALBUMIN 3.1*  --  2.8*  --   --   MG 2.3  --  2.0  --   --   LATICACIDVEN 0.6 1.9  --   --   --     ------------------------------------------------------------------------------------------------------------------ No results for input(s): "CHOL", "HDL", "LDLCALC", "TRIG", "CHOLHDL", "LDLDIRECT" in the last 72 hours.  Lab Results  Component Value Date   HGBA1C 5.4 02/10/2017   ------------------------------------------------------------------------------------------------------------------ No results for input(s): "TSH", "T4TOTAL", "T3FREE", "THYROIDAB" in the last 72 hours.  Invalid input(s): "FREET3"   Cardiac  Enzymes No results for input(s): "CKMB", "TROPONINI", "MYOGLOBIN" in the last 168 hours.  Invalid input(s): "CK" ------------------------------------------------------------------------------------------------------------------    Component Value Date/Time   BNP 8.0 09/28/2016 1513    CBG: Recent Labs  Lab 06/22/22 1012 06/25/22 0809 06/25/22 1731 06/26/22 1021 06/27/22 0834  GLUCAP 142* 89 168* 124* 114*    Recent Results (from the past 240 hour(s))  MRSA Next Gen by PCR, Nasal     Status: None   Collection Time: 06/20/22  9:43 PM   Specimen: Nasal Mucosa; Nasal Swab  Result Value Ref Range Status   MRSA by PCR Next Gen NOT DETECTED NOT DETECTED Final    Comment: (NOTE) The GeneXpert MRSA Assay (FDA approved for NASAL specimens only), is one component of a comprehensive MRSA colonization surveillance program. It is not intended to diagnose MRSA infection nor to guide or monitor treatment for MRSA infections. Test performance is not FDA approved in patients less than 76 years old. Performed at [facility], [address] [place], [address] [place]   Gastrointestinal Panel by PCR , Stool     Status: None   Collection Time: 06/22/22  7:19 AM   Specimen: Stool  Result Value Ref Range Status   Campylobacter species NOT DETECTED NOT DETECTED Final   Plesimonas shigelloides NOT DETECTED NOT DETECTED Final   Salmonella species NOT DETECTED NOT DETECTED Final   Yersinia enterocolitica NOT DETECTED NOT DETECTED Final   Vibrio species NOT DETECTED NOT DETECTED Final   Vibrio cholerae NOT DETECTED NOT DETECTED Final   Enteroaggregative E coli (EAEC) NOT DETECTED NOT DETECTED Final   Enteropathogenic E coli (EPEC) NOT DETECTED NOT DETECTED Final   Enterotoxigenic E coli (ETEC) NOT DETECTED NOT DETECTED Final   Shiga like toxin producing E coli (STEC) NOT DETECTED NOT DETECTED Final   Shigella/Enteroinvasive E coli (EIEC) NOT DETECTED NOT DETECTED Final   Cryptosporidium  NOT DETECTED NOT DETECTED Final   Cyclospora cayetanensis NOT DETECTED NOT DETECTED Final   Entamoeba histolytica NOT DETECTED NOT DETECTED Final   Giardia lamblia NOT DETECTED NOT DETECTED Final   Adenovirus F40/41 NOT DETECTED NOT DETECTED Final   Astrovirus NOT DETECTED NOT DETECTED Final  Norovirus GI/GII NOT DETECTED NOT DETECTED Final   Rotavirus A NOT DETECTED NOT DETECTED Final   Sapovirus (I, II, IV, and V) NOT DETECTED NOT DETECTED Final    Comment: Performed at [facility], [address] [place], [place] [address]  Calprotectin, Fecal     Status: None   Collection Time: 06/22/22  7:19 AM   Specimen: Stool  Result Value Ref Range Status   Calprotectin, Fecal 22 0 - 120 ug/g Final    Comment: (NOTE) Concentration     Interpretation   Follow-Up < 5 - 50 ug/g     Normal           None >50 -120 ug/g     Borderline       Re-evaluate in 4-6 weeks    >120 ug/g     Abnormal         Repeat as clinically                                   indicated Performed At: [facility] [address] [place], [place] [id number] Jolene SchimkeNagendra Sanjai MD YQ:[id number]:[phone]      Radiology Studies: No results found.   Huey Bienenstockawood Jaxan Michel MD [facility]  Between 7 am - 7 pm I am available, please contact me via Amion (for emergencies) or Securechat (non urgent messages)  Between 7 pm - 7 am I am not available, please contact night coverage MD/APP via Amion

## 2022-06-28 ENCOUNTER — Inpatient Hospital Stay (HOSPITAL_COMMUNITY): Payer: Medicaid Other

## 2022-06-28 DIAGNOSIS — E039 Hypothyroidism, unspecified: Secondary | ICD-10-CM

## 2022-06-28 DIAGNOSIS — K56609 Unspecified intestinal obstruction, unspecified as to partial versus complete obstruction: Secondary | ICD-10-CM | POA: Diagnosis not present

## 2022-06-28 DIAGNOSIS — R000 Tachycardia, unspecified: Secondary | ICD-10-CM | POA: Diagnosis not present

## 2022-06-28 DIAGNOSIS — R0602 Shortness of breath: Secondary | ICD-10-CM | POA: Diagnosis not present

## 2022-06-28 LAB — CBC
HCT: 28.3 % — ABNORMAL LOW (ref 36.0–46.0)
Hemoglobin: 8.9 g/dL — ABNORMAL LOW (ref 12.0–15.0)
MCH: 28.7 pg (ref 26.0–34.0)
MCHC: 31.4 g/dL (ref 30.0–36.0)
MCV: 91.3 fL (ref 80.0–100.0)
Platelets: 243 10*3/uL (ref 150–400)
RBC: 3.1 MIL/uL — ABNORMAL LOW (ref 3.87–5.11)
RDW: 13.5 % (ref 11.5–15.5)
WBC: 7.2 10*3/uL (ref 4.0–10.5)
nRBC: 0.3 % — ABNORMAL HIGH (ref 0.0–0.2)

## 2022-06-28 LAB — BASIC METABOLIC PANEL
Anion gap: 8 (ref 5–15)
BUN: 12 mg/dL (ref 6–20)
CO2: 26 mmol/L (ref 22–32)
Calcium: 8.4 mg/dL — ABNORMAL LOW (ref 8.9–10.3)
Chloride: 109 mmol/L (ref 98–111)
Creatinine, Ser: 0.6 mg/dL (ref 0.44–1.00)
GFR, Estimated: >60 mL/min (ref >60)
Glucose, Bld: 115 mg/dL — ABNORMAL HIGH (ref 70–99)
Potassium: 4.3 mmol/L (ref 3.5–5.1)
Sodium: 143 mmol/L (ref 135–145)

## 2022-06-28 LAB — BRAIN NATRIURETIC PEPTIDE: B Natriuretic Peptide: 27.8 pg/mL (ref 0.0–100.0)

## 2022-06-28 LAB — GLUCOSE, CAPILLARY: Glucose-Capillary: 133 mg/dL — ABNORMAL HIGH (ref 70–99)

## 2022-06-28 MED ORDER — METOPROLOL SUCCINATE ER 100 MG PO TB24
100.0000 mg | ORAL_TABLET | Freq: Every day | ORAL | Status: DC
  Administered 2022-06-29: 100 mg via ORAL
  Filled 2022-06-28: qty 1

## 2022-06-28 MED ORDER — IOHEXOL 9 MG/ML PO SOLN
500.0000 mL | ORAL | Status: AC
  Administered 2022-06-28 (×2): 500 mL via ORAL

## 2022-06-28 MED ORDER — METOPROLOL SUCCINATE ER 25 MG PO TB24
25.0000 mg | ORAL_TABLET | Freq: Once | ORAL | Status: AC
  Administered 2022-06-28: 25 mg via ORAL
  Filled 2022-06-28: qty 1

## 2022-06-28 MED ORDER — IOHEXOL 350 MG/ML SOLN
100.0000 mL | Freq: Once | INTRAVENOUS | Status: AC | PRN
  Administered 2022-06-28: 100 mL via INTRAVENOUS

## 2022-06-28 NOTE — Plan of Care (Signed)

## 2022-06-28 NOTE — Progress Notes (Signed)
Mobility Specialist Progress Note:   06/28/22 1010  Mobility  Activity Ambulated with assistance in hallway  Level of Assistance Contact guard assist, steadying assist  Assistive Device Front wheel walker  Distance Ambulated (ft) 75 ft  Activity Response Tolerated fair  Mobility Referral Yes  $Mobility charge 1 Mobility   Pt eager for mobility session. Required no physical assistance, only minG for safety. Pt c/o significant dizziness as distance incr. SpO2 >90% on RA, HR 120-130bpm with ambulation. Pt back in chair with all needs met.   Nelta Numbers Mobility Specialist Please contact via SecureChat or  Rehab office at [phone]

## 2022-06-28 NOTE — Progress Notes (Signed)
PROGRESS NOTE  Megan Manning [id number] DOB: 07/10/1981 DOA: 06/19/2022 PCP: Angelica PouLucas, Latoya, NP   LOS: 8 days   Brief Narrative / Interim history:  41 year old female with borderline personality disorder, DM2, HTN, hypothyroidism, obesity, who comes into the hospital with abdominal pain, nausea, vomiting for the past 2 days.  She was found to have a small bowel obstruction, surgery was consulted and we are asked to admit.  Her bowel obstruction resolved but she has been having persistent large-volume watery diarrhea, and GI consulted today  Subjective / 24h Interval events:  She had couple bowel movements yesterday, she reports heartburn, but no further nausea or vomiting .  Assesement and Plan: Principal Problem:   SOB (shortness of breath) Active Problems:   Diabetes (HCC)   Intellectual disability   SBO (small bowel obstruction) (HCC)   Hypothyroidism  Small bowel obstruction, possible enteritis -General surgery consulted, appreciate input.  She was treated conservatively, clinically and on imaging her bowel obstruction seems to have resolved.  -Did complain of diarrhea, this has resolved, currently on MiraLAX for constipation -GI input greatly appreciated, recommendation for outpatient CT enterography, to evaluate for inflammatory bowel disease they will arrange for outpatient, fecal calprotectin is WNL -Patient have nausea/vomiting/abdominal pain intermittently, will repeat CT abdomen with IV/p.o. contrast for evaluation.  Sinus tachycardia -chronic, stable course, I will go ahead and increase her Toprol-XL further now her blood pressure on the higher side after stopping prazosin.  -  TSH was unremarkable.  Follows with cardiology as an outpatient  Acute kidney injury-due to poor p.o. intake, creatinine has now normalized, but due to persistent diarrhea continue to hold furosemide  DMII -CBG unremarkable, monitor  CBG (last 3)  Recent Labs    06/26/22 1021  06/27/22 0834 06/28/22 0752  GLUCAP 124* 114* 133*    HTN - not currently on medication other than lasix   Lymphedema -on lasix, continue to hold Lasix due to diarrhea   HLD -continue statin   Hypothyrodism -continue Synthroid  Hypertension -Blood pressure soft overnight, prazosin has been discontinued, pressure started to increase we will increase her Toprol-XL  Borderline personality disorder, schizophrenia-on Invega at home, last dose last week.  Resume Depakote, continue Ativan IV due to intermittent nausea   Overall patient is improving, no further evidence of SBO, controlled, she is tolerating ambulation, but she remains weak, tachycardic with activity, she will need to get stronger before she is able to go back to her group home.  Scheduled Meds:  atorvastatin  20 mg Oral QHS   divalproex  500 mg Oral BID   gabapentin  300 mg Oral q morning   And   gabapentin  600 mg Oral QHS   heparin  5,000 Units Subcutaneous Q8H   levothyroxine  88 mcg Oral Q0600   [START ON 06/29/2022] metoprolol succinate  100 mg Oral Daily   metoprolol succinate  25 mg Oral Once   pantoprazole  40 mg Oral Daily   polyethylene glycol  17 g Oral QHS   Continuous Infusions:   PRN Meds:.acetaminophen, albuterol, ALPRAZolam, dicyclomine, haloperidol lactate, ondansetron **OR** ondansetron (ZOFRAN) IV  Current Outpatient Medications  Medication Instructions   acetaminophen (TYLENOL) 1,000 mg, Oral, Every 6 hours PRN   albuterol (VENTOLIN HFA) 108 (90 Base) MCG/ACT inhaler 2 puffs, Inhalation, Every 6 hours PRN   atorvastatin (LIPITOR) 20 mg, Oral, Daily at bedtime   Azelastine HCl (ASTEPRO) 0.15 % SOLN 1 spray, Nasal, 2 times daily   divalproex (DEPAKOTE ER) 500  mg, Oral, 2 times daily   furosemide (LASIX) 40 mg, Oral, Daily   gabapentin (NEURONTIN) 300-600 mg, Oral, See admin instructions, Give 1 capsule ( 300mg )  in the morning and 2 (600 mg) capsules at bedtime   haloperidol (HALDOL) 5 mg, 2  times daily   Invega Sustenna 234 mg, Intramuscular, Every 28 days   levothyroxine (SYNTHROID) 88 mcg, Oral, Daily   loratadine (CLARITIN) 10 mg, Oral, Daily   LORazepam (ATIVAN) 1 mg, Oral, Every 8 hours   medroxyPROGESTERone (DEPO-PROVERA) 150 mg, Intramuscular, Every 3 months   metoprolol succinate (TOPROL-XL) 50 mg, Oral, Daily   montelukast (SINGULAIR) 10 mg, Oral, Daily at bedtime   nystatin (MYCOSTATIN/NYSTOP) powder 1 Application, Topical, 2 times daily   Olopatadine HCl 0.2 % SOLN 1 drop, Both Eyes, 2 times daily   omeprazole (PRILOSEC) 20 mg, Oral, Daily   potassium chloride SA (KLOR-CON M) 20 MEQ tablet 20 mEq, Oral, Daily   prazosin (MINIPRESS) 5 mg, Oral, Daily at bedtime   sertraline (ZOLOFT) 50 mg, Daily   traZODone (DESYREL) 200 mg, Daily at bedtime   Vitamin D (Ergocalciferol) (DRISDOL) 50,000 Units, Oral, Every 7 days    Diet Orders (From admission, onward)     Start     Ordered   06/22/22 0901  DIET SOFT Room service appropriate? Yes with Assist; Fluid consistency: Thin  Diet effective now       Question Answer Comment  Room service appropriate? Yes with Assist   Fluid consistency: Thin      06/22/22 0900            DVT prophylaxis: heparin injection 5,000 Units Start: 06/20/22 0600   Lab Results  Component Value Date   PLT 243 06/28/2022      Code Status: Full Code  Family Communication: No family at bedside  Status is: Inpatient  Remains inpatient appropriate because: Persistent diarrhea  Level of care: Progressive  Consultants:  General surgery Gastroenterology  Objective: Vitals:   06/28/22 0300 06/28/22 0500 06/28/22 0750 06/28/22 1239  BP: 112/74  139/77 (!) 146/87  Pulse: (!) 111  (!) 115 93  Resp: (!) 23  (!) 22 (!) 26  Temp: 98.6 F (37 C)  98 F (36.7 C) 98.2 F (36.8 C)  TempSrc: Oral  Oral Oral  SpO2: 91%  93% 96%  Weight:  (!) 143.8 kg    Height:       No intake or output data in the 24 hours ending 06/28/22  1407  Wt Readings from Last 3 Encounters:  06/28/22 (!) 143.8 kg  12/24/21 121.1 kg  04/07/21 134.3 kg   Examination:    Awake Alert, in no apparent distress, impaired judgment and insight Symmetrical Chest wall movement, Good air movement bilaterally, CTAB RRR,No Gallops,Rubs or new Murmurs, No Parasternal Heave +ve B.Sounds, Abd Soft, No tenderness, No rebound - guarding or rigidity. No Cyanosis, Clubbing or edema, No new Rash or bruise     Data Reviewed: I have independently reviewed following labs and imaging studies   CBC Recent Labs  Lab 06/24/22 1256 06/27/22 0156 06/28/22 0222  WBC 6.7 7.3 7.2  HGB 10.4* 9.4* 8.9*  HCT 32.6* 29.7* 28.3*  PLT 272 243 243  MCV 89.3 90.8 91.3  MCH 28.5 28.7 28.7  MCHC 31.9 31.6 31.4  RDW 12.8 13.2 13.5    Recent Labs  Lab 06/24/22 0829 06/25/22 0445 06/27/22 0156 06/28/22 0222  NA 142 142 142 143  K 3.8 3.8 4.2 4.3  CL 106 106 107 109  CO2 27 27 27 26   GLUCOSE 90 102* 96 115*  BUN 10 12 12 12   CREATININE 0.72 0.76 0.66 0.60  CALCIUM 8.4* 8.5* 8.3* 8.4*  AST 19  --   --   --   ALT 12  --   --   --   ALKPHOS 33*  --   --   --   BILITOT 0.2*  --   --   --   ALBUMIN 2.8*  --   --   --   MG 2.0  --   --   --   BNP  --   --   --  27.8    ------------------------------------------------------------------------------------------------------------------ No results for input(s): "CHOL", "HDL", "LDLCALC", "TRIG", "CHOLHDL", "LDLDIRECT" in the last 72 hours.  Lab Results  Component Value Date   HGBA1C 5.4 02/10/2017   ------------------------------------------------------------------------------------------------------------------ No results for input(s): "TSH", "T4TOTAL", "T3FREE", "THYROIDAB" in the last 72 hours.  Invalid input(s): "FREET3"   Cardiac Enzymes No results for input(s): "CKMB", "TROPONINI", "MYOGLOBIN" in the last 168 hours.  Invalid input(s):  "CK" ------------------------------------------------------------------------------------------------------------------    Component Value Date/Time   BNP 27.8 06/28/2022 0222    CBG: Recent Labs  Lab 06/25/22 0809 06/25/22 1731 06/26/22 1021 06/27/22 0834 06/28/22 0752  GLUCAP 89 168* 124* 114* 133*    Recent Results (from the past 240 hour(s))  MRSA Next Gen by PCR, Nasal     Status: None   Collection Time: 06/20/22  9:43 PM   Specimen: Nasal Mucosa; Nasal Swab  Result Value Ref Range Status   MRSA by PCR Next Gen NOT DETECTED NOT DETECTED Final    Comment: (NOTE) The GeneXpert MRSA Assay (FDA approved for NASAL specimens only), is one component of a comprehensive MRSA colonization surveillance program. It is not intended to diagnose MRSA infection nor to guide or monitor treatment for MRSA infections. Test performance is not FDA approved in patients less than 55 years old. Performed at [facility], [address] [place], [address] [place]   Gastrointestinal Panel by PCR , Stool     Status: None   Collection Time: 06/22/22  7:19 AM   Specimen: Stool  Result Value Ref Range Status   Campylobacter species NOT DETECTED NOT DETECTED Final   Plesimonas shigelloides NOT DETECTED NOT DETECTED Final   Salmonella species NOT DETECTED NOT DETECTED Final   Yersinia enterocolitica NOT DETECTED NOT DETECTED Final   Vibrio species NOT DETECTED NOT DETECTED Final   Vibrio cholerae NOT DETECTED NOT DETECTED Final   Enteroaggregative E coli (EAEC) NOT DETECTED NOT DETECTED Final   Enteropathogenic E coli (EPEC) NOT DETECTED NOT DETECTED Final   Enterotoxigenic E coli (ETEC) NOT DETECTED NOT DETECTED Final   Shiga like toxin producing E coli (STEC) NOT DETECTED NOT DETECTED Final   Shigella/Enteroinvasive E coli (EIEC) NOT DETECTED NOT DETECTED Final   Cryptosporidium NOT DETECTED NOT DETECTED Final   Cyclospora cayetanensis NOT DETECTED NOT DETECTED Final   Entamoeba  histolytica NOT DETECTED NOT DETECTED Final   Giardia lamblia NOT DETECTED NOT DETECTED Final   Adenovirus F40/41 NOT DETECTED NOT DETECTED Final   Astrovirus NOT DETECTED NOT DETECTED Final   Norovirus GI/GII NOT DETECTED NOT DETECTED Final   Rotavirus A NOT DETECTED NOT DETECTED Final   Sapovirus (I, II, IV, and V) NOT DETECTED NOT DETECTED Final    Comment: Performed at [facility], [address] [place], [address] [place]  Calprotectin, Fecal  Status: None   Collection Time: 06/22/22  7:19 AM   Specimen: Stool  Result Value Ref Range Status   Calprotectin, Fecal 22 0 - 120 ug/g Final    Comment: (NOTE) Concentration     Interpretation   Follow-Up < 5 - 50 ug/g     Normal           None >50 -120 ug/g     Borderline       Re-evaluate in 4-6 weeks    >120 ug/g     Abnormal         Repeat as clinically                                   indicated Performed At: [facility] [address] [place], [place] [id number] Jolene Schimke MD WU:[id number]      Radiology Studies: No results found.   Huey Bienenstock MD [facility]  Between 7 am - 7 pm I am available, please contact me via Amion (for emergencies) or Securechat (non urgent messages)  Between 7 pm - 7 am I am not available, please contact night coverage MD/APP via Amion

## 2022-06-29 DIAGNOSIS — F603 Borderline personality disorder: Secondary | ICD-10-CM

## 2022-06-29 DIAGNOSIS — K56609 Unspecified intestinal obstruction, unspecified as to partial versus complete obstruction: Secondary | ICD-10-CM | POA: Diagnosis not present

## 2022-06-29 DIAGNOSIS — E039 Hypothyroidism, unspecified: Secondary | ICD-10-CM | POA: Diagnosis not present

## 2022-06-29 LAB — GLUCOSE, CAPILLARY: Glucose-Capillary: 109 mg/dL — ABNORMAL HIGH (ref 70–99)

## 2022-06-29 MED ORDER — METOPROLOL SUCCINATE ER 100 MG PO TB24: 100.0000 mg | ORAL_TABLET | Freq: Every day | ORAL | 0 refills | Status: DC

## 2022-06-29 MED ORDER — FUROSEMIDE 40 MG PO TABS: 20.0000 mg | ORAL_TABLET | Freq: Every day | ORAL | 0 refills | Status: DC

## 2022-06-29 MED ORDER — SENNOSIDES-DOCUSATE SODIUM 8.6-50 MG PO TABS: 1.0000 | ORAL_TABLET | Freq: Every day | ORAL | 0 refills | Status: DC

## 2022-06-29 MED ORDER — POLYETHYLENE GLYCOL 3350 17 G PO PACK: 17.0000 g | PACK | Freq: Every day | ORAL | 0 refills | Status: DC | PRN

## 2022-06-29 MED ORDER — LORAZEPAM 1 MG PO TABS: 1.0000 mg | ORAL_TABLET | Freq: Three times a day (TID) | ORAL | 0 refills | Status: DC | PRN

## 2022-06-29 MED ORDER — POTASSIUM CHLORIDE CRYS ER 20 MEQ PO TBCR: 10.0000 meq | EXTENDED_RELEASE_TABLET | Freq: Every day | ORAL | Status: DC

## 2022-06-29 NOTE — Discharge Instructions (Signed)
Follow with Primary MD Angelica Pou, NP in 7 days   Get CBC, CMP,  checked  by Primary MD next visit.    Activity: As tolerated with Full fall precautions use walker/cane & assistance as needed   Disposition Home    Diet: regular diet    On your next visit with your primary care physician please Get Medicines reviewed and adjusted.   Please request your Prim.MD to go over all Hospital Tests and Procedure/Radiological results at the follow up, please get all Hospital records sent to your Prim MD by signing hospital release before you go home.   If you experience worsening of your admission symptoms, develop shortness of breath, life threatening emergency, suicidal or homicidal thoughts you must seek medical attention immediately by calling 911 or calling your MD immediately  if symptoms less severe.  You Must read complete instructions/literature along with all the possible adverse reactions/side effects for all the Medicines you take and that have been prescribed to you. Take any new Medicines after you have completely understood and accpet all the possible adverse reactions/side effects.   Do not drive, operating heavy machinery, perform activities at heights, swimming or participation in water activities or provide baby sitting services if your were admitted for syncope or siezures until you have seen by Primary MD or a Neurologist and advised to do so again.  Do not drive when taking Pain medications.    Do not take more than prescribed Pain, Sleep and Anxiety Medications  Special Instructions: If you have smoked or chewed Tobacco  in the last 2 yrs please stop smoking, stop any regular Alcohol  and or any Recreational drug use.  Wear Seat belts while driving.   Please note  You were cared for by a hospitalist during your hospital stay. If you have any questions about your discharge medications or the care you received while you were in the hospital after you are discharged,  you can call the unit and asked to speak with the hospitalist on call if the hospitalist that took care of you is not available. Once you are discharged, your primary care physician will handle any further medical issues. Please note that NO REFILLS for any discharge medications will be authorized once you are discharged, as it is imperative that you return to your primary care physician (or establish a relationship with a primary care physician if you do not have one) for your aftercare needs so that they can reassess your need for medications and monitor your lab values.

## 2022-06-29 NOTE — Progress Notes (Signed)
Mobility Specialist Progress Note:   06/29/22 1104  Mobility  Activity Ambulated with assistance in hallway  Level of Assistance Standby assist, set-up cues, supervision of patient - no hands on  Assistive Device Front wheel walker  Distance Ambulated (ft) 150 ft  Activity Response Tolerated well  Mobility Referral Yes  $Mobility charge 1 Mobility   Pt received in chair and agreeable. No complaints. Pt left in chair with all needs met and call bell in reach.   Andrey Campanile Mobility Specialist Please contact via SecureChat or  Rehab office at [phone]

## 2022-06-29 NOTE — Discharge Summary (Signed)
Physician Discharge Summary  Megan Manning NFA:[id number] DOB: [birth year] DOA: 06/19/2022  PCP: Angelica Pou, NP  Admit date: 06/19/2022 Discharge date: 06/29/2022  Admitted From: (Group Home) Disposition:  (Group Home)  Recommendations for Outpatient Follow-up:  Follow up with PCP in 1-2 weeks Please obtain BMP/CBC in one week If patient keeps having problem with recurrent abdominal pain, severe constipation, then please consider outpatient GI follow-up with CT enterography   Discharge Condition: (Stable) CODE STATUS: (FULL) Diet recommendation: Heart Healthy/carb modified  Brief/Interim Summary:  41 year old female with borderline personality disorder, DM2, HTN, hypothyroidism, obesity, who comes into the hospital with abdominal pain, nausea, vomiting for the past 2 days. She was found to have a small bowel obstruction, surgery was consulted .  reommendation was for conservative management, small bowel obstruction has resolved, but after that she did have significant watery diarrhea, GI has been consulted, they were entertaining inflammatory bowel disease as a diagnosis, her fecal calprotectin is WNL, currently she is tolerating oral intake, with regular bowel movement, she is stable for discharge back to her facility.  Small bowel obstruction, possible enteritis -General surgery consulted, appreciate input.  She was treated conservatively, clinically and on imaging her bowel obstruction seems to have resolved.  -Did complain of diarrhea, this has resolved, she was required to be on scheduled MiraLAX to resolve her constipation, she will be discharged on senna Colace once at bedtime, please adjust if needed. -GI input greatly appreciated, recommendation for outpatient CT enterography, to evaluate for inflammatory bowel disease they will arrange for outpatient, fecal calprotectin is WNL. -Kept having intermittent complaints of abdominal pain, she had 1 episode of nausea and vomiting on  12/2, so her CT abdomen/pelvis with IV contrast was repeated on 12/3, it was with no acute findings.   Sinus tachycardia -chronic, stable course, she is on Toprol-XL at home, dose has been increased to 100 mg oral daily. (Her home dose prazosin has been discontinued) -  TSH was unremarkable.  Follows with cardiology as an outpatient   Acute kidney injury-due to poor p.o. intake, creatinine has now normalized, he is euvolemic at time of discharge, home dose Lasix has been lowered to 20 mg oral daily.]  DMII -CBG unremarkable, controlled with diet.   CBG (last 3)  Recent Labs (last 2 labs)       Recent Labs    06/26/22 1021 06/27/22 0834 06/28/22 0752  GLUCAP 124* 114* 133*      HTN -prazosin has been discontinued, and Toprol XL has been increased    Lymphedema -on lasix, continue to hold Lasix due to diarrhea   HLD -continue statin   Hypothyrodism -continue Synthroid    Borderline personality disorder, schizophrenia -Continue with home regimen, her Ativan has been changed to as needed during hospital stay   Discharge Diagnoses:  Principal Problem:   SOB (shortness of breath) Active Problems:   Diabetes (HCC)   Intellectual disability   SBO (small bowel obstruction) (HCC)   Hypothyroidism    Discharge Instructions  Discharge Instructions     Ambulatory referral to Physical Therapy   Complete by: As directed    Diet - low sodium heart healthy   Complete by: As directed    Discharge instructions   Complete by: As directed    Follow with Primary MD Angelica Pou, NP in 7 days   Get CBC, CMP,  checked  by Primary MD next visit.    Activity: As tolerated with Full fall precautions use walker/cane & assistance as  needed   Disposition Home    Diet: regular diet    On your next visit with your primary care physician please Get Medicines reviewed and adjusted.   Please request your Prim.MD to go over all Hospital Tests and Procedure/Radiological results at  the follow up, please get all Hospital records sent to your Prim MD by signing hospital release before you go home.   If you experience worsening of your admission symptoms, develop shortness of breath, life threatening emergency, suicidal or homicidal thoughts you must seek medical attention immediately by calling 911 or calling your MD immediately  if symptoms less severe.  You Must read complete instructions/literature along with all the possible adverse reactions/side effects for all the Medicines you take and that have been prescribed to you. Take any new Medicines after you have completely understood and accpet all the possible adverse reactions/side effects.   Do not drive, operating heavy machinery, perform activities at heights, swimming or participation in water activities or provide baby sitting services if your were admitted for syncope or siezures until you have seen by Primary MD or a Neurologist and advised to do so again.  Do not drive when taking Pain medications.    Do not take more than prescribed Pain, Sleep and Anxiety Medications  Special Instructions: If you have smoked or chewed Tobacco  in the last 2 yrs please stop smoking, stop any regular Alcohol  and or any Recreational drug use.  Wear Seat belts while driving.   Please note  You were cared for by a hospitalist during your hospital stay. If you have any questions about your discharge medications or the care you received while you were in the hospital after you are discharged, you can call the unit and asked to speak with the hospitalist on call if the hospitalist that took care of you is not available. Once you are discharged, your primary care physician will handle any further medical issues. Please note that NO REFILLS for any discharge medications will be authorized once you are discharged, as it is imperative that you return to your primary care physician (or establish a relationship with a primary care physician  if you do not have one) for your aftercare needs so that they can reassess your need for medications and monitor your lab values.   Increase activity slowly   Complete by: As directed       Allergies as of 06/29/2022       Reactions   Chlorpromazine Other (See Comments)   Caused cornea problems        Medication List     STOP taking these medications    haloperidol 5 MG tablet Commonly known as: HALDOL   prazosin 5 MG capsule Commonly known as: MINIPRESS   sertraline 50 MG tablet Commonly known as: ZOLOFT   traZODone 100 MG tablet Commonly known as: DESYREL       TAKE these medications    acetaminophen 500 MG tablet Commonly known as: TYLENOL Take 1,000 mg by mouth every 6 (six) hours as needed for moderate pain.   albuterol 108 (90 Base) MCG/ACT inhaler Commonly known as: VENTOLIN HFA Inhale 2 puffs into the lungs every 6 (six) hours as needed for wheezing or shortness of breath.   Astepro 0.15 % Soln Generic drug: Azelastine HCl Place 1 spray into the nose 2 (two) times daily.   atorvastatin 20 MG tablet Commonly known as: LIPITOR Take 20 mg by mouth at bedtime.   divalproex 500  MG 24 hr tablet Commonly known as: DEPAKOTE ER Take 500 mg by mouth 2 (two) times daily.   furosemide 40 MG tablet Commonly known as: LASIX Take 0.5 tablets (20 mg total) by mouth daily. What changed: how much to take   gabapentin 300 MG capsule Commonly known as: NEURONTIN Take 300-600 mg by mouth See admin instructions. Give 1 capsule ( )  in the morning and 2 (600 mg) capsules at bedtime   Gean Birchwood 234 MG/1.5ML injection Generic drug: paliperidone Inject 234 mg into the muscle every 28 (twenty-eight) days.   levothyroxine 88 MCG tablet Commonly known as: SYNTHROID Take 1 tablet (88 mcg total) by mouth daily.   loratadine 10 MG tablet Commonly known as: CLARITIN Take 1 tablet (10 mg total) by mouth daily.   LORazepam 1 MG tablet Commonly known as:  ATIVAN Take 1 tablet (1 mg total) by mouth every 8 (eight) hours as needed for anxiety. What changed:  when to take this reasons to take this   medroxyPROGESTERone 150 MG/ML injection Commonly known as: DEPO-PROVERA Inject 150 mg into the muscle every 3 (three) months.   metoprolol succinate 100 MG 24 hr tablet Commonly known as: TOPROL-XL Take 1 tablet (100 mg total) by mouth daily. Take with or immediately following a meal. Start taking on: June 30, 2022 What changed:  medication strength how much to take additional instructions   montelukast 10 MG tablet Commonly known as: SINGULAIR Take 10 mg by mouth at bedtime.   nystatin powder Commonly known as: MYCOSTATIN/NYSTOP Apply 1 Application topically 2 (two) times daily.   Olopatadine HCl 0.2 % Soln Place 1 drop into both eyes in the morning and at bedtime.   omeprazole 20 MG capsule Commonly known as: PRILOSEC Take 1 capsule (20 mg total) by mouth daily.   polyethylene glycol 17 g packet Commonly known as: MIRALAX / GLYCOLAX Take 17 g by mouth daily as needed.   potassium chloride SA 20 MEQ tablet Commonly known as: KLOR-CON M Take 0.5 tablets (10 mEq total) by mouth daily. What changed: how much to take   senna-docusate 8.6-50 MG tablet Commonly known as: Senokot-S Take 1 tablet by mouth at bedtime.   Vitamin D (Ergocalciferol) 1.25 MG (50000 UNIT) Caps capsule Commonly known as: DRISDOL Take 50,000 Units by mouth every 7 (seven) days.               Durable Medical Equipment  (From admission, onward)           Start     Ordered   06/25/22 1541  For home use only DME Walker rolling  Once       Question Answer Comment  Walker: With 5 Inch Wheels   Patient needs a walker to treat with the following condition Weakness      06/25/22 1540            Allergies  Allergen Reactions   Chlorpromazine Other (See Comments)    Caused cornea problems    Consultations: General  surgery GI   Procedures/Studies: CT ABDOMEN PELVIS W CONTRAST  Result Date: 06/28/2022 CLINICAL DATA:  Abdominal pain, acute, nonlocalized persistant abd pain, nausea, vomiting, EXAM: CT ABDOMEN AND PELVIS WITH CONTRAST TECHNIQUE: Multidetector CT imaging of the abdomen and pelvis was performed using the standard protocol following bolus administration of intravenous contrast. RADIATION DOSE REDUCTION: This exam was performed according to the departmental dose-optimization program which includes automated exposure control, adjustment of the mA and/or kV according to patient  size and/or use of iterative reconstruction technique. CONTRAST:  OMNIPAQUE IOHEXOL 350 MG/ML SOLN COMPARISON:  06/19/2022 FINDINGS: Lower chest: Elevation of the right hemidiaphragm. Right base atelectasis or scarring. Hepatobiliary: No focal hepatic abnormality. Gallbladder unremarkable. Pancreas: No focal abnormality or ductal dilatation. Spleen: No focal abnormality.  Normal size. Adrenals/Urinary Tract: No adrenal abnormality. No focal renal abnormality. No stones or hydronephrosis. Urinary bladder is unremarkable. Stomach/Bowel: Normal appendix. Stomach, large and small bowel grossly unremarkable. Vascular/Lymphatic: No evidence of aneurysm or adenopathy. Reproductive: Uterus and adnexa unremarkable.  No mass. Other: Trace free fluid in the cul-de-sac.  No free air. Musculoskeletal: No acute bony abnormality. IMPRESSION: No acute findings in the abdomen or pelvis. Electronically Signed   By: Charlett Nose M.D.   On: 06/28/2022 19:05   DG Chest Port 1 View  Result Date: 06/26/2022 CLINICAL DATA:  Hypoxia EXAM: PORTABLE CHEST 1 VIEW COMPARISON:  Chest radiograph dated 06/20/2022 FINDINGS: Unchanged elevation of the right hemidiaphragm. Right basilar linear opacities. Left basilar patchy opacities. No pleural effusion or pneumothorax. The heart size and mediastinal contours are within normal limits. The visualized skeletal  structures are unremarkable. IMPRESSION: Unchanged elevation of right hemidiaphragm. Bibasilar opacities, likely atelectasis. Electronically Signed   By: Agustin Cree M.D.   On: 06/26/2022 08:47   DG Abd Portable 1V  Result Date: 06/23/2022 CLINICAL DATA:  Small-bowel obstruction. EXAM: PORTABLE ABDOMEN - 1 VIEW COMPARISON:  06/20/2022 FINDINGS: The NG tube is no longer visualized and was likely removed. The contrast that was seen in the right colon has a vacuo aided. There are air-filled small bowel loops and colon without significant distension. Findings could suggest a residual partial small bowel obstruction or diffuse ileus. IMPRESSION: Persistent air-filled but not significantly dilated small bowel loops and colon suggesting a residual partial small bowel obstruction or diffuse ileus. Electronically Signed   By: Rudie Meyer M.D.   On: 06/23/2022 11:22   DG Abd Portable 1V-Small Bowel Obstruction Protocol-initial, 8 hr delay  Result Date: 06/20/2022 CLINICAL DATA:  : small bowel obstruction 8 hr delay Chief complaints: abdominal pain Principal problem: SOB EXAM: PORTABLE ABDOMEN - 1 VIEW COMPARISON:  X-ray abdomen 06/20/2022 1:53 p.m., CT abdomen pelvis 06/19/2022 FINDINGS: Enteric tube overlying the expected region of the gastric lumen/gastric antrum. PO contrast reaches the rectum. Small bowel gaseous dilatation measuring up to 4 cm. No radio-opaque calculi or other significant radiographic abnormality are seen. IMPRESSION: 1. Enteric tube overlying the expected region of the gastric lumen/first portion of duodenum. 2. PO contrast reaches the rectum. 3. Partial small bowel obstruction versus ileus. Electronically Signed   By: Tish Frederickson M.D.   On: 06/20/2022 23:07   DG Abd Portable 1V-Small Bowel Protocol-Position Verification  Result Date: 06/20/2022 CLINICAL DATA:  Evaluate NG tube EXAM: PORTABLE ABDOMEN - 1 VIEW COMPARISON:  None Available. FINDINGS: The NG tube side port is in the  region of the distal stomach. The distal tip is probably in the proximal duodenum. IMPRESSION: The NG tube side port is in the distal stomach. The distal tip is probably in the proximal duodenum. Air-filled dilated loops of small bowel persist but are mildly improved. Electronically Signed   By: Gerome Sam III M.D.   On: 06/20/2022 13:53   DG ABD ACUTE 2+V W 1V CHEST  Result Date: 06/20/2022 CLINICAL DATA:  Final pain. EXAM: DG ABDOMEN ACUTE WITH 1 VIEW CHEST COMPARISON:  May 06, 2022.  June 19, 2022. FINDINGS: Moderately dilated small bowel loops are noted without colonic  dilatation. This is concerning for distal small bowel obstruction. No radiopaque calculi or other significant radiographic abnormality is seen. Heart size and mediastinal contours are within normal limits. Both lungs are clear. IMPRESSION: Moderately dilated small bowel loops are noted concerning for distal small bowel obstruction. No acute cardiopulmonary disease. Electronically Signed   By: Lupita Raider M.D.   On: 06/20/2022 08:03   CT ABDOMEN PELVIS W CONTRAST  Result Date: 06/19/2022 CLINICAL DATA:  Vomiting and fatigue for 2 days, initial encounter EXAM: CT ANGIOGRAPHY CHEST CT ABDOMEN AND PELVIS WITH CONTRAST TECHNIQUE: Multidetector CT imaging of the chest was performed using the standard protocol during bolus administration of intravenous contrast. Multiplanar CT image reconstructions and MIPs were obtained to evaluate the vascular anatomy. Multidetector CT imaging of the abdomen and pelvis was performed using the standard protocol during bolus administration of intravenous contrast. RADIATION DOSE REDUCTION: This exam was performed according to the departmental dose-optimization program which includes automated exposure control, adjustment of the mA and/or kV according to patient size and/or use of iterative reconstruction technique. CONTRAST:  OMNIPAQUE IOHEXOL 350 MG/ML SOLN COMPARISON:  None Available.  FINDINGS: CTA CHEST FINDINGS Cardiovascular: Thoracic aorta and its branches are within normal limits. No cardiac enlargement is seen. The pulmonary artery shows a normal branching pattern without intraluminal filling defect to suggest pulmonary embolism. Mediastinum/Nodes: Thoracic inlet is within normal limits. No hilar or mediastinal adenopathy is noted. The esophagus as visualized is within normal limits. Lungs/Pleura: Mild bibasilar atelectasis is noted. No sizable effusion or focal confluent infiltrate is seen. Musculoskeletal: No chest wall abnormality. No acute or significant osseous findings. Review of the MIP images confirms the above findings. CT ABDOMEN and PELVIS FINDINGS Hepatobiliary: No focal liver abnormality is seen. No gallstones, gallbladder wall thickening, or biliary dilatation. Pancreas: Unremarkable. No pancreatic ductal dilatation or surrounding inflammatory changes. Spleen: Normal in size without focal abnormality. Adrenals/Urinary Tract: Adrenal glands are within normal limits. Kidneys demonstrate a normal enhancement pattern. No renal calculi or obstructive changes are seen. The bladder is decompressed. Stomach/Bowel: Appendix is within normal limits. No obstructive or inflammatory changes of the colon are seen. Stomach is decompressed. There are several mildly dilated loops of small bowel identified in the mid abdomen which appear to involve the distal jejunum and proximal ileum. Some inflammatory wall thickening is seen as well as a focal transition zone deep in the pelvis on image number 88 of series 3. Fecalization of bowel contents is noted proximal to this transition zone consistent with a partial small bowel obstruction. Vascular/Lymphatic: No significant vascular findings are present. No enlarged abdominal or pelvic lymph nodes. Reproductive: Uterus and bilateral adnexa are unremarkable. Other: Mild free fluid is noted within the pelvis and surrounding the spleen likely reactive  in nature to the changes in the small bowel. No free air is seen. No hernia is noted. Musculoskeletal: Mild degenerative changes of the lumbar spine are noted. Review of the MIP images confirms the above findings. IMPRESSION: Dilated loops of mid small bowel with fecalization of bowel contents and wall edema consistent with a mild partial small bowel obstruction. Focal transition zone is noted deep within the pelvis although no mass lesion is seen. Electronically Signed   By: Alcide Clever M.D.   On: 06/19/2022 22:40   CT Angio Chest PE W and/or Wo Contrast  Result Date: 06/19/2022 CLINICAL DATA:  Vomiting and fatigue for 2 days, initial encounter EXAM: CT ANGIOGRAPHY CHEST CT ABDOMEN AND PELVIS WITH CONTRAST TECHNIQUE: Multidetector CT  imaging of the chest was performed using the standard protocol during bolus administration of intravenous contrast. Multiplanar CT image reconstructions and MIPs were obtained to evaluate the vascular anatomy. Multidetector CT imaging of the abdomen and pelvis was performed using the standard protocol during bolus administration of intravenous contrast. RADIATION DOSE REDUCTION: This exam was performed according to the departmental dose-optimization program which includes automated exposure control, adjustment of the mA and/or kV according to patient size and/or use of iterative reconstruction technique. CONTRAST:  OMNIPAQUE IOHEXOL 350 MG/ML SOLN COMPARISON:  None Available. FINDINGS: CTA CHEST FINDINGS Cardiovascular: Thoracic aorta and its branches are within normal limits. No cardiac enlargement is seen. The pulmonary artery shows a normal branching pattern without intraluminal filling defect to suggest pulmonary embolism. Mediastinum/Nodes: Thoracic inlet is within normal limits. No hilar or mediastinal adenopathy is noted. The esophagus as visualized is within normal limits. Lungs/Pleura: Mild bibasilar atelectasis is noted. No sizable effusion or focal confluent  infiltrate is seen. Musculoskeletal: No chest wall abnormality. No acute or significant osseous findings. Review of the MIP images confirms the above findings. CT ABDOMEN and PELVIS FINDINGS Hepatobiliary: No focal liver abnormality is seen. No gallstones, gallbladder wall thickening, or biliary dilatation. Pancreas: Unremarkable. No pancreatic ductal dilatation or surrounding inflammatory changes. Spleen: Normal in size without focal abnormality. Adrenals/Urinary Tract: Adrenal glands are within normal limits. Kidneys demonstrate a normal enhancement pattern. No renal calculi or obstructive changes are seen. The bladder is decompressed. Stomach/Bowel: Appendix is within normal limits. No obstructive or inflammatory changes of the colon are seen. Stomach is decompressed. There are several mildly dilated loops of small bowel identified in the mid abdomen which appear to involve the distal jejunum and proximal ileum. Some inflammatory wall thickening is seen as well as a focal transition zone deep in the pelvis on image number 88 of series 3. Fecalization of bowel contents is noted proximal to this transition zone consistent with a partial small bowel obstruction. Vascular/Lymphatic: No significant vascular findings are present. No enlarged abdominal or pelvic lymph nodes. Reproductive: Uterus and bilateral adnexa are unremarkable. Other: Mild free fluid is noted within the pelvis and surrounding the spleen likely reactive in nature to the changes in the small bowel. No free air is seen. No hernia is noted. Musculoskeletal: Mild degenerative changes of the lumbar spine are noted. Review of the MIP images confirms the above findings. IMPRESSION: Dilated loops of mid small bowel with fecalization of bowel contents and wall edema consistent with a mild partial small bowel obstruction. Focal transition zone is noted deep within the pelvis although no mass lesion is seen. Electronically Signed   By: Alcide Clever M.D.   On:  06/19/2022 22:40      Subjective:  No significant events overnight as discussed with staff, she had a good BM yesterday, good appetite, no nausea, no vomiting. Discharge Exam: Vitals:   06/29/22 0200 06/29/22 0834  BP: 130/67 136/79  Pulse: 92   Resp: (!) 21 (!) 21  Temp: 98 F (36.7 C) 98.3 F (36.8 C)  SpO2: 90%    Vitals:   06/29/22 0100 06/29/22 0200 06/29/22 0400 06/29/22 0834  BP: 129/71 130/67  136/79  Pulse: 96 92    Resp: (!) 26 (!) 21  (!) 21  Temp:  98 F (36.7 C)  98.3 F (36.8 C)  TempSrc:  Axillary  Oral  SpO2:  90%    Weight:   (!) 144 kg   Height:  General: Pt is alert, awake, not in acute distress, she is with impaired cognition and insight Cardiovascular: RRR, S1/S2 +, no rubs, no gallops Respiratory: CTA bilaterally, no wheezing, no rhonchi Abdominal: Soft, NT, ND, bowel sounds + Extremities: no edema, no cyanosis    The results of significant diagnostics from this hospitalization (including imaging, microbiology, ancillary and laboratory) are listed below for reference.     Microbiology: Recent Results (from the past 240 hour(s))  MRSA Next Gen by PCR, Nasal     Status: None   Collection Time: 06/20/22  9:43 PM   Specimen: Nasal Mucosa; Nasal Swab  Result Value Ref Range Status   MRSA by PCR Next Gen NOT DETECTED NOT DETECTED Final    Comment: (NOTE) The GeneXpert MRSA Assay (FDA approved for NASAL specimens only), is one component of a comprehensive MRSA colonization surveillance program. It is not intended to diagnose MRSA infection nor to guide or monitor treatment for MRSA infections. Test performance is not FDA approved in patients less than 42 years old. Performed at [facility], [address], [place], [place] [address]   Gastrointestinal Panel by PCR , Stool     Status: None   Collection Time: 06/22/22  7:19 AM   Specimen: Stool  Result Value Ref Range Status   Campylobacter species NOT DETECTED NOT DETECTED  Final   Plesimonas shigelloides NOT DETECTED NOT DETECTED Final   Salmonella species NOT DETECTED NOT DETECTED Final   Yersinia enterocolitica NOT DETECTED NOT DETECTED Final   Vibrio species NOT DETECTED NOT DETECTED Final   Vibrio cholerae NOT DETECTED NOT DETECTED Final   Enteroaggregative E coli (EAEC) NOT DETECTED NOT DETECTED Final   Enteropathogenic E coli (EPEC) NOT DETECTED NOT DETECTED Final   Enterotoxigenic E coli (ETEC) NOT DETECTED NOT DETECTED Final   Shiga like toxin producing E coli (STEC) NOT DETECTED NOT DETECTED Final   Shigella/Enteroinvasive E coli (EIEC) NOT DETECTED NOT DETECTED Final   Cryptosporidium NOT DETECTED NOT DETECTED Final   Cyclospora cayetanensis NOT DETECTED NOT DETECTED Final   Entamoeba histolytica NOT DETECTED NOT DETECTED Final   Giardia lamblia NOT DETECTED NOT DETECTED Final   Adenovirus F40/41 NOT DETECTED NOT DETECTED Final   Astrovirus NOT DETECTED NOT DETECTED Final   Norovirus GI/GII NOT DETECTED NOT DETECTED Final   Rotavirus A NOT DETECTED NOT DETECTED Final   Sapovirus (I, II, IV, and V) NOT DETECTED NOT DETECTED Final    Comment: Performed at [facility], [address] [place], [place] [address]  Calprotectin, Fecal     Status: None   Collection Time: 06/22/22  7:19 AM   Specimen: Stool  Result Value Ref Range Status   Calprotectin, Fecal 22 0 - 120 ug/g Final    Comment: (NOTE) Concentration     Interpretation   Follow-Up < 5 - 50 ug/g     Normal           None >50 -120 ug/g     Borderline       Re-evaluate in 4-6 weeks    >120 ug/g     Abnormal         Repeat as clinically                                   indicated Performed At: [facility] [address] [place], [place] [id number] Jolene Schimke MD HQ:[id number]  Labs: BNP (last 3 results) Recent Labs    06/28/22 0222  BNP 27.8   Basic Metabolic Panel: Recent Labs  Lab 06/24/22 0829 06/25/22 0445 06/27/22 0156 06/28/22 0222  NA 142  142 142 143  K 3.8 3.8 4.2 4.3  CL 106 106 107 109  CO2 27 27 27 26   GLUCOSE 90 102* 96 115*  BUN 10 12 12 12   CREATININE 0.72 0.76 0.66 0.60  CALCIUM 8.4* 8.5* 8.3* 8.4*  MG 2.0  --   --   --    Liver Function Tests: Recent Labs  Lab 06/24/22 0829  AST 19  ALT 12  ALKPHOS 33*  BILITOT 0.2*  PROT 5.1*  ALBUMIN 2.8*   No results for input(s): "LIPASE", "AMYLASE" in the last 168 hours. No results for input(s): "AMMONIA" in the last 168 hours. CBC: Recent Labs  Lab 06/24/22 1256 06/27/22 0156 06/28/22 0222  WBC 6.7 7.3 7.2  HGB 10.4* 9.4* 8.9*  HCT 32.6* 29.7* 28.3*  MCV 89.3 90.8 91.3  PLT 272 243 243   Cardiac Enzymes: No results for input(s): "CKTOTAL", "CKMB", "CKMBINDEX", "TROPONINI" in the last 168 hours. BNP: Invalid input(s): "POCBNP" CBG: Recent Labs  Lab 06/25/22 1731 06/26/22 1021 06/27/22 0834 06/28/22 0752 06/29/22 0833  GLUCAP 168* 124* 114* 133* 109*   D-Dimer No results for input(s): "DDIMER" in the last 72 hours. Hgb A1c No results for input(s): "HGBA1C" in the last 72 hours. Lipid Profile No results for input(s): "CHOL", "HDL", "LDLCALC", "TRIG", "CHOLHDL", "LDLDIRECT" in the last 72 hours. Thyroid function studies No results for input(s): "TSH", "T4TOTAL", "T3FREE", "THYROIDAB" in the last 72 hours.  Invalid input(s): "FREET3" Anemia work up No results for input(s): "VITAMINB12", "FOLATE", "FERRITIN", "TIBC", "IRON", "RETICCTPCT" in the last 72 hours. Urinalysis    Component Value Date/Time   COLORURINE YELLOW 06/20/2022 0549   APPEARANCEUR CLEAR 06/20/2022 0549   LABSPEC >1.046 (H) 06/20/2022 0549   PHURINE 5.0 06/20/2022 0549   GLUCOSEU NEGATIVE 06/20/2022 0549   HGBUR NEGATIVE 06/20/2022 0549   BILIRUBINUR NEGATIVE 06/20/2022 0549   KETONESUR 5 (A) 06/20/2022 0549   PROTEINUR 30 (A) 06/20/2022 0549   UROBILINOGEN 1.0 08/11/2009 1818   NITRITE NEGATIVE 06/20/2022 0549   LEUKOCYTESUR NEGATIVE 06/20/2022 0549   Sepsis  Labs Recent Labs  Lab 06/24/22 1256 06/27/22 0156 06/28/22 0222  WBC 6.7 7.3 7.2   Microbiology Recent Results (from the past 240 hour(s))  MRSA Next Gen by PCR, Nasal     Status: None   Collection Time: 06/20/22  9:43 PM   Specimen: Nasal Mucosa; Nasal Swab  Result Value Ref Range Status   MRSA by PCR Next Gen NOT DETECTED NOT DETECTED Final    Comment: (NOTE) The GeneXpert MRSA Assay (FDA approved for NASAL specimens only), is one component of a comprehensive MRSA colonization surveillance program. It is not intended to diagnose MRSA infection nor to guide or monitor treatment for MRSA infections. Test performance is not FDA approved in patients less than 61 years old. Performed at [facility], [address] [place], [address] [place]   Gastrointestinal Panel by PCR , Stool     Status: None   Collection Time: 06/22/22  7:19 AM   Specimen: Stool  Result Value Ref Range Status   Campylobacter species NOT DETECTED NOT DETECTED Final   Plesimonas shigelloides NOT DETECTED NOT DETECTED Final   Salmonella species NOT DETECTED NOT DETECTED Final   Yersinia enterocolitica NOT DETECTED NOT DETECTED Final  Vibrio species NOT DETECTED NOT DETECTED Final   Vibrio cholerae NOT DETECTED NOT DETECTED Final   Enteroaggregative E coli (EAEC) NOT DETECTED NOT DETECTED Final   Enteropathogenic E coli (EPEC) NOT DETECTED NOT DETECTED Final   Enterotoxigenic E coli (ETEC) NOT DETECTED NOT DETECTED Final   Shiga like toxin producing E coli (STEC) NOT DETECTED NOT DETECTED Final   Shigella/Enteroinvasive E coli (EIEC) NOT DETECTED NOT DETECTED Final   Cryptosporidium NOT DETECTED NOT DETECTED Final   Cyclospora cayetanensis NOT DETECTED NOT DETECTED Final   Entamoeba histolytica NOT DETECTED NOT DETECTED Final   Giardia lamblia NOT DETECTED NOT DETECTED Final   Adenovirus F40/41 NOT DETECTED NOT DETECTED Final   Astrovirus NOT DETECTED NOT DETECTED Final   Norovirus GI/GII NOT  DETECTED NOT DETECTED Final   Rotavirus A NOT DETECTED NOT DETECTED Final   Sapovirus (I, II, IV, and V) NOT DETECTED NOT DETECTED Final    Comment: Performed at [facility], [address] [place], [place] [address]  Calprotectin, Fecal     Status: None   Collection Time: 06/22/22  7:19 AM   Specimen: Stool  Result Value Ref Range Status   Calprotectin, Fecal 22 0 - 120 ug/g Final    Comment: (NOTE) Concentration     Interpretation   Follow-Up < 5 - 50 ug/g     Normal           None >50 -120 ug/g     Borderline       Re-evaluate in 4-6 weeks    >120 ug/g     Abnormal         Repeat as clinically                                   indicated Performed At: [facility] [address] [place], [place] [id number] Jolene SchimkeNagendra Sanjai MD XL:[id number]:[phone]      Time coordinating discharge: Over 30 minutes  SIGNED:   Huey Bienenstockawood Pearlina Friedly, MD  [facility] 06/29/2022, 10:39 AM Pager   If 7PM-7AM, please contact night-coverage [email or web address]

## 2022-08-06 ENCOUNTER — Encounter: Payer: Self-pay | Admitting: Gastroenterology

## 2022-08-06 ENCOUNTER — Emergency Department (HOSPITAL_COMMUNITY): Payer: Medicaid Other

## 2022-08-06 ENCOUNTER — Emergency Department (HOSPITAL_COMMUNITY)
Admission: EM | Admit: 2022-08-06 | Discharge: 2022-08-07 | Disposition: A | Discharge status: Home / Self Care | Payer: Medicaid Other | Attending: Emergency Medicine | Admitting: Emergency Medicine

## 2022-08-06 ENCOUNTER — Other Ambulatory Visit: Payer: Self-pay

## 2022-08-06 ENCOUNTER — Ambulatory Visit (INDEPENDENT_AMBULATORY_CARE_PROVIDER_SITE_OTHER): Payer: Medicaid Other | Admitting: Gastroenterology

## 2022-08-06 ENCOUNTER — Ambulatory Visit (INDEPENDENT_AMBULATORY_CARE_PROVIDER_SITE_OTHER)
Admission: RE | Admit: 2022-08-06 | Discharge: 2022-08-06 | Disposition: A | Discharge status: Home / Self Care | Payer: Medicaid Other | Source: Ambulatory Visit | Attending: Gastroenterology | Admitting: Gastroenterology

## 2022-08-06 ENCOUNTER — Encounter (HOSPITAL_COMMUNITY): Payer: Self-pay

## 2022-08-06 ENCOUNTER — Other Ambulatory Visit (INDEPENDENT_AMBULATORY_CARE_PROVIDER_SITE_OTHER): Payer: Medicaid Other

## 2022-08-06 VITALS — BP 110/76 | HR 106 | Ht 63.0 in | Wt 304.0 lb

## 2022-08-06 DIAGNOSIS — D649 Anemia, unspecified: Secondary | ICD-10-CM

## 2022-08-06 DIAGNOSIS — J45909 Unspecified asthma, uncomplicated: Secondary | ICD-10-CM | POA: Insufficient documentation

## 2022-08-06 DIAGNOSIS — R0602 Shortness of breath: Secondary | ICD-10-CM | POA: Insufficient documentation

## 2022-08-06 DIAGNOSIS — R159 Full incontinence of feces: Secondary | ICD-10-CM

## 2022-08-06 DIAGNOSIS — K56609 Unspecified intestinal obstruction, unspecified as to partial versus complete obstruction: Secondary | ICD-10-CM

## 2022-08-06 LAB — CBC WITH DIFFERENTIAL/PLATELET
Basophils Absolute: 0.1 10*3/uL (ref 0.0–0.1)
Basophils Relative: 1.8 % (ref 0.0–3.0)
Eosinophils Absolute: 0.1 10*3/uL (ref 0.0–0.7)
Eosinophils Relative: 2.4 % (ref 0.0–5.0)
HCT: 37 % (ref 36.0–46.0)
Hemoglobin: 12 g/dL (ref 12.0–15.0)
Lymphocytes Relative: 36.5 % (ref 12.0–46.0)
Lymphs Abs: 1.9 10*3/uL (ref 0.7–4.0)
MCHC: 32.5 g/dL (ref 30.0–36.0)
MCV: 86.5 fl (ref 78.0–100.0)
Monocytes Absolute: 0.4 10*3/uL (ref 0.1–1.0)
Monocytes Relative: 7 % (ref 3.0–12.0)
Neutro Abs: 2.7 10*3/uL (ref 1.4–7.7)
Neutrophils Relative %: 52.3 % (ref 43.0–77.0)
Platelets: 225 10*3/uL (ref 150.0–400.0)
RBC: 4.28 Mil/uL (ref 3.87–5.11)
RDW: 14.1 % (ref 11.5–15.5)
WBC: 5.2 10*3/uL (ref 4.0–10.5)

## 2022-08-06 LAB — IBC + FERRITIN
Ferritin: 47.5 ng/mL (ref 10.0–291.0)
Iron: 61 ug/dL (ref 42–145)
Saturation Ratios: 13.3 % — ABNORMAL LOW (ref 20.0–50.0)
TIBC: 457.8 ug/dL — ABNORMAL HIGH (ref 250.0–450.0)
Transferrin: 327 mg/dL (ref 212.0–360.0)

## 2022-08-06 LAB — C-REACTIVE PROTEIN: CRP: <1 mg/dL (ref 0.5–20.0)

## 2022-08-06 MED ORDER — IPRATROPIUM-ALBUTEROL 0.5-2.5 (3) MG/3ML IN SOLN
3.0000 mL | Freq: Once | RESPIRATORY_TRACT | Status: AC
  Administered 2022-08-07: 3 mL via RESPIRATORY_TRACT
  Filled 2022-08-06: qty 3

## 2022-08-06 NOTE — ED Triage Notes (Signed)
Pt reports with chest pain and SHOB since today. Pt reports a hx of asthma. Pt reports using the inhaler at home with no relief. Pt walked more than normal today.

## 2022-08-06 NOTE — Patient Instructions (Addendum)
_______________________________________________________  If you are age 43 or older, your body mass index should be between 23-30. Your Body mass index is 53.85 kg/m. If this is out of the aforementioned range listed, please consider follow up with your Primary Care Provider.  If you are age 46 or younger, your body mass index should be between 19-25. Your Body mass index is 53.85 kg/m. If this is out of the aformentioned range listed, please consider follow up with your Primary Care Provider.   Your provider has requested that you go to the basement level for lab work before leaving today. Press "B" on the elevator. The lab is located at the first door on the left as you exit the elevator.  Your provider has requested that you have an abdominal x ray before leaving today. Please go to the basement floor to our Radiology department for the test.   The [facility] providers would like to encourage you to use [facility] to communicate with providers for non-urgent requests or questions.  Due to long hold times on the telephone, sending your provider a message by [facility] may be a faster and more efficient way to get a response.  Please allow 48 business hours for a response.  Please remember that this is for non-urgent requests.   It was a pleasure to see you today!  Thank you for trusting me with your gastrointestinal care!    Scott E.Candis Schatz, MD

## 2022-08-06 NOTE — Progress Notes (Signed)
HPI : Megan Manning is a very pleasant 42 year old female with a history of intellectual disability, diabetes, lymphedema, borderline personality disorder/adjustment disorder who I initially saw during recent hospitalization for partial small bowel obstruction.  She is accompanied by her caretaker/guardian today who provides most of the history.  She was admitted to [facility] from November 24 through December 4 after she presented with abdominal pain nausea and vomiting for 2 days.  CT scan was consistent with a small bowel obstruction.  Her obstructive symptoms resolved, but she then continued to have severe diarrhea with incontinence requiring fecal management system.  There was concern for possible inflammatory bowel disease.  A fecal calprotectin was obtained which was unremarkable.  A GI pathogen panel was negative for infectious etiologies.  Her diarrhea eventually resolved.  A repeat CT scan on December 3 was unremarkable.  She reports doing well since her discharge.  She has frequent bowel movements which are typically very large in volume.  She frequently has fecal incontinence which has been a longstanding problem.  She wears depends.  Stools have been nonbloody.  Sometimes she will have problems with constipation because she eats "a lot of cheese". No problems with abdominal pain, nausea or vomiting.  Her appetite is good.    Component Ref Range & Units 1 mo ago  Calprotectin, Fecal 0 - 120 ug/g 22   Component Ref Range & Units 1 mo ago  Campylobacter species NOT DETECTED NOT DETECTED  Plesimonas shigelloides NOT DETECTED NOT DETECTED  Salmonella species NOT DETECTED NOT DETECTED  Yersinia enterocolitica NOT DETECTED NOT DETECTED  Vibrio species NOT DETECTED NOT DETECTED  Vibrio cholerae NOT DETECTED NOT DETECTED  Enteroaggregative E coli (EAEC) NOT DETECTED NOT DETECTED  Enteropathogenic E coli (EPEC) NOT DETECTED NOT DETECTED  Enterotoxigenic E coli (ETEC) NOT DETECTED NOT  DETECTED  Shiga like toxin producing E coli (STEC) NOT DETECTED NOT DETECTED  Shigella/Enteroinvasive E coli (EIEC) NOT DETECTED NOT DETECTED  Cryptosporidium NOT DETECTED NOT DETECTED  Cyclospora cayetanensis NOT DETECTED NOT DETECTED  Entamoeba histolytica NOT DETECTED NOT DETECTED  Giardia lamblia NOT DETECTED NOT DETECTED  Adenovirus F40/41 NOT DETECTED NOT DETECTED  Astrovirus NOT DETECTED NOT DETECTED  Norovirus GI/GII NOT DETECTED NOT DETECTED  Rotavirus A NOT DETECTED NOT DETECTED  Sapovirus (I, II, IV, and V) NOT DETECTED NOT DETECTED   CT abdomen/pelvis Jun 28, 2022 IMPRESSION: No acute findings in the abdomen or pelvis Previously noted small bowel dilation resolved  CT abdomen/pelvis June 19, 2022 IMPRESSION: Dilated loops of mid small bowel with fecalization of bowel contents and wall edema consistent with a mild partial small bowel obstruction. Focal transition zone is noted deep within the pelvis although no mass lesion is seen.      02/2015 colonoscopy with placement of decompression tube.  At [facility].  Diagnosis of sigmoid volvulus later felt not to have volvulus but severe constipation. 03/2015 flexible sigmoidoscopy.  Evaluation rectal bleeding.  2, 2 to 3 mm, polyps in rectum were resected/retrieved.  Suspect hyperplastic polyps.  Granular rectal mucosa, biopsied.  Nonbleeding internal hemorrhoids.  Pathology showed no significant pathologic diagnosis, no active or chronic colitis, no granulomas, no dysplasia, no lymphocytic or collagenous colitis 02/13/2016 EGD.  Dr. Cloyde Reams at [facility].  Unable to retrieve report. 02/20/2016 EGD by Dr. Annabell Howells at [facility].  Unable to retrieve report 04/08/2016 EGD at [facility] after swallowing shower curtain clips.  Endoscopist did not encounter esophageal or gastric foreign body and cysts back to the foreign  body had passed through. 11/2016 colonoscopy.  Dr. Vicente Males.  Evaluation of rectal bleeding.  Noted nonbleeding, medium sized,  grade 1 internal hemorrhoids.  Study otherwise normal to the terminal ileum. 02/2018 EGD.  Dr. Vida Roller at [facility].  For evaluation abdominal pain with unremarkable CT scan.  EGD was normal.. Past Medical History:  Diagnosis Date   Adjustment disorder    Borderline personality disorder ([place])    Diabetes mellitus without complication ([place])    Hypertension    Hyperthyroidism    Intellectual disability    Lactic acidosis    Lymphedema    Obesity    Overdose    Pica in adults    Thyroid disease      Past Surgical History:  Procedure Laterality Date   COLONOSCOPY WITH PROPOFOL N/A 12/22/2016   Procedure: COLONOSCOPY WITH PROPOFOL;  Surgeon: [name], MD;  [facility] ENDOSCOPY;  Service: Endoscopy;  Laterality: N/A;   NO PAST SURGERIES     Family History  Problem Relation Age of Onset   Healthy Mother    Healthy Father    CVA Neg Hx    Social History   Tobacco Use   Smoking status: Never   Smokeless tobacco: Never  Vaping Use   Vaping Use: Never used  Substance Use Topics   Alcohol use: No   Drug use: No   Current Outpatient Medications  Medication Sig Dispense Refill   albuterol (VENTOLIN HFA) 108 (90 Base) MCG/ACT inhaler Inhale 2 puffs into the lungs every 6 (six) hours as needed for wheezing or shortness of breath.      atorvastatin (LIPITOR) 20 MG tablet Take 20 mg by mouth at bedtime.     divalproex (DEPAKOTE ER) 500 MG 24 hr tablet Take 500 mg by mouth 2 (two) times daily.     furosemide (LASIX) 40 MG tablet Take 0.5 tablets (20 mg total) by mouth daily. 15 tablet 0   gabapentin (NEURONTIN) 300 MG capsule Take 300-600 mg by mouth See admin instructions. Give 1 capsule ( 300mg )  in the morning and 2 (600 mg) capsules at bedtime     INVEGA SUSTENNA 234 MG/1.5ML injection Inject 234 mg into the muscle every 28 (twenty-eight) days.     levothyroxine (SYNTHROID) 88 MCG tablet Take 1 tablet (88 mcg total) by mouth daily. 30 tablet 1   loratadine (CLARITIN) 10 MG  tablet Take 1 tablet (10 mg total) by mouth daily. 30 tablet 1   LORazepam (ATIVAN) 1 MG tablet Take 1 tablet (1 mg total) by mouth every 8 (eight) hours as needed for anxiety. 30 tablet 0   medroxyPROGESTERone (DEPO-PROVERA) 150 MG/ML injection Inject 150 mg into the muscle every 3 (three) months.     montelukast (SINGULAIR) 10 MG tablet Take 10 mg by mouth at bedtime.     nystatin (MYCOSTATIN/NYSTOP) powder Apply 1 Application topically 2 (two) times daily.     omeprazole (PRILOSEC) 20 MG capsule Take 1 capsule (20 mg total) by mouth daily. 30 capsule 1   polyethylene glycol (MIRALAX / GLYCOLAX) 17 g packet Take 17 g by mouth daily as needed. 14 each 0   potassium chloride SA (KLOR-CON M) 20 MEQ tablet Take 0.5 tablets (10 mEq total) by mouth daily.     senna-docusate (SENOKOT-S) 8.6-50 MG tablet Take 1 tablet by mouth at bedtime. 30 tablet 0   acetaminophen (TYLENOL) 500 MG tablet Take 1,000 mg by mouth every 6 (six) hours as needed for moderate pain.  Azelastine HCl (ASTEPRO) 0.15 % SOLN Place 1 spray into the nose 2 (two) times daily.     metoprolol succinate (TOPROL-XL) 100 MG 24 hr tablet Take 1 tablet (100 mg total) by mouth daily. Take with or immediately following a meal. 30 tablet 0   Olopatadine HCl 0.2 % SOLN Place 1 drop into both eyes in the morning and at bedtime. (Patient not taking: Reported on 08/06/2022)     Vitamin D, Ergocalciferol, (DRISDOL) 1.25 MG (50000 UNIT) CAPS capsule Take 50,000 Units by mouth every 7 (seven) days.     No current facility-administered medications for this visit.   Allergies  Allergen Reactions   Chlorpromazine Other (See Comments)    Caused cornea problems     Review of Systems: All systems reviewed and negative except where noted in HPI.    No results found.  Physical Exam: BP 110/76   Pulse (!) 106   Ht 5\' 3"  (1.6 m)   Wt (!) 304 lb (137.9 kg)   BMI 53.85 kg/m  Constitutional: Pleasant,well-developed, African-American female in  no acute distress.  Accompanied by guardian/caretaker HEENT: Normocephalic and atraumatic. Conjunctivae are normal. No scleral icterus. Cardiovascular: Normal rate, regular rhythm.  Pulmonary/chest: Effort normal and breath sounds normal. No wheezing, rales or rhonchi. Abdominal: Soft, nondistended, nontender. Bowel sounds active throughout. There are no masses palpable. No hepatomegaly. Extremities: Massive bilateral lymphedema Neurological: Alert and oriented to person place and time. Skin: Skin is warm and dry. No rashes noted. Psychiatric: Normal mood and affect. Behavior is normal.  CBC    Component Value Date/Time   WBC 7.2 06/28/2022 0222   RBC 3.10 (L) 06/28/2022 0222   HGB 8.9 (L) 06/28/2022 0222   HCT 28.3 (L) 06/28/2022 0222   PLT 243 06/28/2022 0222   MCV 91.3 06/28/2022 0222   MCH 28.7 06/28/2022 0222   MCHC 31.4 06/28/2022 0222   RDW 13.5 06/28/2022 0222   LYMPHSABS 2.1 06/19/2022 1954   MONOABS 0.8 06/19/2022 1954   EOSABS 0.0 06/19/2022 1954   BASOSABS 0.0 06/19/2022 1954    CMP     Component Value Date/Time   NA 143 06/28/2022 0222   K 4.3 06/28/2022 0222   CL 109 06/28/2022 0222   CO2 26 06/28/2022 0222   GLUCOSE 115 (H) 06/28/2022 0222   BUN 12 06/28/2022 0222   CREATININE 0.60 06/28/2022 0222   CALCIUM 8.4 (L) 06/28/2022 0222   PROT 5.1 (L) 06/24/2022 0829   ALBUMIN 2.8 (L) 06/24/2022 0829   AST 19 06/24/2022 0829   ALT 12 06/24/2022 0829   ALKPHOS 33 (L) 06/24/2022 0829   BILITOT 0.2 (L) 06/24/2022 0829   GFRNONAA >60 06/28/2022 0222   GFRAA >60 01/10/2020 0132     ASSESSMENT AND PLAN: 42 year old female with history of intellectual disability, borderline personality disorder, diabetes lymphedema with recent admission for partial small bowel obstruction, which resolved with conservative therapy, followed by period of excessive diarrhea with incontinence.  She has recovered from this and is back to her baseline bowel habits which consist of  frequent large-volume bowel movements, often with incontinence.  No problems of abdominal pain, nausea or vomiting.  No blood in the stool. She was found to have a drop in her hemoglobin during hospitalization, which may have been related to IV fluid resuscitation.  I would like to repeat a CBC to see if her anemia has improved. Given the concern for possible underlying Crohn's disease, would like to repeat a KUB to ensure that there  are no dilated loops or other concerns for inflammatory changes in the intestines. With regards to her incontinence, I recommended she add fiber supplementation to improve stool consistency.  If no improvement, can consider pelvic floor physical therapy  Hx of SBO/motility d/o - Repeat KUB  Anemia - CBC  Fecal incontinence - Fiber - Consider PT   Tanyika Barros E. Tomasa Rand, MD [facility]  CC:  Angelica Pou, NP

## 2022-08-07 MED ORDER — ALBUTEROL SULFATE HFA 108 (90 BASE) MCG/ACT IN AERS
2.0000 | INHALATION_SPRAY | Freq: Four times a day (QID) | RESPIRATORY_TRACT | Status: DC | PRN
  Administered 2022-08-07: 2 via RESPIRATORY_TRACT
  Filled 2022-08-07: qty 6.7

## 2022-08-07 MED ORDER — IPRATROPIUM-ALBUTEROL 0.5-2.5 (3) MG/3ML IN SOLN
3.0000 mL | Freq: Once | RESPIRATORY_TRACT | Status: AC
  Administered 2022-08-07: 3 mL via RESPIRATORY_TRACT
  Filled 2022-08-07: qty 3

## 2022-08-07 MED ORDER — AEROCHAMBER PLUS FLO-VU LARGE MISC
1.0000 | Freq: Once | Status: AC
  Administered 2022-08-07: 1
  Filled 2022-08-07 (×2): qty 1

## 2022-08-07 MED ORDER — ALBUTEROL SULFATE (2.5 MG/3ML) 0.083% IN NEBU: 2.5000 mg | INHALATION_SOLUTION | Freq: Four times a day (QID) | RESPIRATORY_TRACT | 0 refills | Status: DC | PRN

## 2022-08-07 NOTE — ED Provider Notes (Signed)
[facility] Emergency Department Provider Note MRN:  [id number]  Arrival date & time: 08/07/22     Chief Complaint   Chest Pain and Shortness of Breath   History of Present Illness   Megan Manning is a 42 y.o. year-old female presents to the ED with chief complaint of SOB.  Reports hx of asthma.  States that she has been using her inhaler without relief.  She thinks that her symptoms were exacerbated by walking more than normal today.  She denies fever, chills, or productive cough.  History provided by patient.   Review of Systems  Pertinent positive and negative review of systems noted in HPI.    Physical Exam   Vitals:   08/07/22 0100 08/07/22 0130  BP: (!) 144/83 (!) 154/87  Pulse: (!) 101 99  Resp: (!) 24 19  Temp:    SpO2: 91% 94%    CONSTITUTIONAL:  non toxic-appearing, NAD NEURO:  Alert and oriented x 3, CN 3-12 grossly intact EYES:  eyes equal and reactive ENT/NECK:  Supple, no stridor  CARDIO:  mildly tachycardic, regular rhythm, appears well-perfused  PULM:  No respiratory distress, diminished in bases GI/GU:  non-distended,  MSK/SPINE:  No gross deformities, no edema, moves all extremities  SKIN:  no rash, atraumatic   *Additional and/or pertinent findings included in MDM below  Diagnostic and Interventional Summary    EKG Interpretation  Date/Time:    Ventricular Rate:    PR Interval:    QRS Duration:   QT Interval:    QTC Calculation:   R Axis:     Text Interpretation:         Labs Reviewed  RESP PANEL BY RT-PCR (RSV, FLU A&B, COVID)  [id number]    DG Chest 2 View  Final Result      Medications  albuterol (VENTOLIN HFA) 108 (90 Base) MCG/ACT inhaler 2 puff (2 puffs Inhalation Given 08/07/22 0129)  ipratropium-albuterol (DUONEB) 0.5-2.5 (3) MG/3ML nebulizer solution 3 mL (3 mLs Nebulization Given 08/07/22 0017)  AeroChamber Plus Flo-Vu Large MISC 1 each (1 each Other Given 08/07/22 0130)  ipratropium-albuterol  (DUONEB) 0.5-2.5 (3) MG/3ML nebulizer solution 3 mL (3 mLs Nebulization Given 08/07/22 0129)     Procedures  /  Critical Care Procedures  ED Course and Medical Decision Making  I have reviewed the triage vital signs, the nursing notes, and pertinent available records from the EMR.  Social Determinants Affecting Complexity of Care: Patient has no clinically significant social determinants affecting this chief complaint..   ED Course:    Medical Decision Making Patient here with SOB thought 2/2 to asthma exacerbation.  Will give neb and check labs and CXR.    Patient and guardian inform RN that they'd like to go home and forego lab testing.   CXR negative for infiltrate.  Will give RX for albuterol.  Discussed return precautions.  Patient states that she is feeling better now.  Vitals have improved.  Amount and/or Complexity of Data Reviewed Labs: ordered. Radiology: ordered and independent interpretation performed.    Details: No large infiltrate  Risk Prescription drug management.     Consultants: No consultations were needed in caring for this patient.   Treatment and Plan: Emergency department workup does not suggest an emergent condition requiring admission or immediate intervention beyond  what has been performed at this time. The patient is safe for discharge and has  been instructed to return immediately for worsening symptoms, change in  symptoms or any  other concerns    Final Clinical Impressions(s) / ED Diagnoses     ICD-10-CM   1. SOB (shortness of breath)  R06.02       ED Discharge Orders          Ordered    albuterol (PROVENTIL) (2.5 MG/3ML) 0.083% nebulizer solution  Every 6 hours PRN        08/07/22 0117              Discharge Instructions Discussed with and Provided to Patient:   Discharge Instructions   None      Montine Circle, PA-C 08/07/22 0203    Jeanell Sparrow, DO 08/08/22 [phone]

## 2022-08-08 ENCOUNTER — Emergency Department (HOSPITAL_COMMUNITY)
Admission: EM | Admit: 2022-08-08 | Discharge: 2022-08-09 | Discharge status: Against Medical Advice | Payer: Medicaid Other | Attending: Emergency Medicine | Admitting: Emergency Medicine

## 2022-08-08 ENCOUNTER — Other Ambulatory Visit: Payer: Self-pay

## 2022-08-08 DIAGNOSIS — E119 Type 2 diabetes mellitus without complications: Secondary | ICD-10-CM | POA: Insufficient documentation

## 2022-08-08 DIAGNOSIS — T189XXA Foreign body of alimentary tract, part unspecified, initial encounter: Secondary | ICD-10-CM | POA: Diagnosis not present

## 2022-08-08 DIAGNOSIS — W449XXA Unspecified foreign body entering into or through a natural orifice, initial encounter: Secondary | ICD-10-CM | POA: Insufficient documentation

## 2022-08-08 DIAGNOSIS — E039 Hypothyroidism, unspecified: Secondary | ICD-10-CM | POA: Insufficient documentation

## 2022-08-08 DIAGNOSIS — I10 Essential (primary) hypertension: Secondary | ICD-10-CM | POA: Diagnosis not present

## 2022-08-08 DIAGNOSIS — Z5321 Procedure and treatment not carried out due to patient leaving prior to being seen by health care provider: Secondary | ICD-10-CM | POA: Insufficient documentation

## 2022-08-08 DIAGNOSIS — Z79899 Other long term (current) drug therapy: Secondary | ICD-10-CM | POA: Insufficient documentation

## 2022-08-08 LAB — CBC WITH DIFFERENTIAL/PLATELET
Abs Immature Granulocytes: 0.02 10*3/uL (ref 0.00–0.07)
Basophils Absolute: 0 10*3/uL (ref 0.0–0.1)
Basophils Relative: 0 %
Eosinophils Absolute: 0.1 10*3/uL (ref 0.0–0.5)
Eosinophils Relative: 3 %
HCT: 37.3 % (ref 36.0–46.0)
Hemoglobin: 12 g/dL (ref 12.0–15.0)
Immature Granulocytes: 0 %
Lymphocytes Relative: 41 %
Lymphs Abs: 2.3 10*3/uL (ref 0.7–4.0)
MCH: 28.9 pg (ref 26.0–34.0)
MCHC: 32.2 g/dL (ref 30.0–36.0)
MCV: 89.9 fL (ref 80.0–100.0)
Monocytes Absolute: 0.5 10*3/uL (ref 0.1–1.0)
Monocytes Relative: 9 %
Neutro Abs: 2.7 10*3/uL (ref 1.7–7.7)
Neutrophils Relative %: 47 %
Platelets: 215 10*3/uL (ref 150–400)
RBC: 4.15 MIL/uL (ref 3.87–5.11)
RDW: 12.9 % (ref 11.5–15.5)
WBC: 5.7 10*3/uL (ref 4.0–10.5)
nRBC: 0 % (ref 0.0–0.2)

## 2022-08-08 LAB — URINALYSIS, ROUTINE W REFLEX MICROSCOPIC
Bilirubin Urine: NEGATIVE
Glucose, UA: NEGATIVE mg/dL
Hgb urine dipstick: NEGATIVE
Ketones, ur: NEGATIVE mg/dL
Leukocytes,Ua: NEGATIVE
Nitrite: NEGATIVE
Protein, ur: NEGATIVE mg/dL
Specific Gravity, Urine: 1.028 (ref 1.005–1.030)
pH: 5 (ref 5.0–8.0)

## 2022-08-08 LAB — COMPREHENSIVE METABOLIC PANEL
ALT: 11 U/L (ref 0–44)
AST: 18 U/L (ref 15–41)
Albumin: 3.7 g/dL (ref 3.5–5.0)
Alkaline Phosphatase: 56 U/L (ref 38–126)
Anion gap: 11 (ref 5–15)
BUN: 15 mg/dL (ref 6–20)
CO2: 23 mmol/L (ref 22–32)
Calcium: 8.8 mg/dL — ABNORMAL LOW (ref 8.9–10.3)
Chloride: 105 mmol/L (ref 98–111)
Creatinine, Ser: 0.9 mg/dL (ref 0.44–1.00)
GFR, Estimated: >60 mL/min (ref >60)
Glucose, Bld: 104 mg/dL — ABNORMAL HIGH (ref 70–99)
Potassium: 4.2 mmol/L (ref 3.5–5.1)
Sodium: 139 mmol/L (ref 135–145)
Total Bilirubin: 0.3 mg/dL (ref 0.3–1.2)
Total Protein: 6.6 g/dL (ref 6.5–8.1)

## 2022-08-08 LAB — LIPASE, BLOOD: Lipase: 54 U/L — ABNORMAL HIGH (ref 11–51)

## 2022-08-08 NOTE — ED Triage Notes (Addendum)
Pt arrives with c/o swallowing plastic today. Per pt, her AD was hurting so she decided to swallow some plastic. Per family member, when pt gets upset she starts to try to stuff. Per family member, pt also ate a paper cup. Pt endorses ABD pain. Pt tearful in triage.

## 2022-08-09 ENCOUNTER — Emergency Department (HOSPITAL_COMMUNITY): Payer: Medicaid Other

## 2022-08-09 ENCOUNTER — Encounter (HOSPITAL_COMMUNITY): Payer: Self-pay | Admitting: Emergency Medicine

## 2022-08-09 ENCOUNTER — Other Ambulatory Visit: Payer: Self-pay

## 2022-08-09 ENCOUNTER — Emergency Department (HOSPITAL_COMMUNITY)
Admission: EM | Admit: 2022-08-09 | Discharge: 2022-08-09 | Disposition: A | Discharge status: Home / Self Care | Payer: Medicaid Other | Source: Home / Self Care | Attending: Emergency Medicine | Admitting: Emergency Medicine

## 2022-08-09 ENCOUNTER — Ambulatory Visit (HOSPITAL_COMMUNITY)
Admission: EM | Admit: 2022-08-09 | Discharge: 2022-08-09 | Disposition: A | Discharge status: Home / Self Care | Payer: Medicaid Other | Source: Home / Self Care

## 2022-08-09 ENCOUNTER — Ambulatory Visit (HOSPITAL_COMMUNITY)
Admission: EM | Admit: 2022-08-09 | Discharge: 2022-08-09 | Disposition: A | Discharge status: Other Acute Inpatient Hospital | Payer: Medicaid Other | Attending: Urology | Admitting: Urology

## 2022-08-09 DIAGNOSIS — R4689 Other symptoms and signs involving appearance and behavior: Secondary | ICD-10-CM

## 2022-08-09 DIAGNOSIS — T189XXA Foreign body of alimentary tract, part unspecified, initial encounter: Secondary | ICD-10-CM | POA: Insufficient documentation

## 2022-08-09 DIAGNOSIS — Z79899 Other long term (current) drug therapy: Secondary | ICD-10-CM | POA: Insufficient documentation

## 2022-08-09 DIAGNOSIS — F603 Borderline personality disorder: Secondary | ICD-10-CM | POA: Insufficient documentation

## 2022-08-09 DIAGNOSIS — F918 Other conduct disorders: Secondary | ICD-10-CM | POA: Insufficient documentation

## 2022-08-09 DIAGNOSIS — I10 Essential (primary) hypertension: Secondary | ICD-10-CM | POA: Insufficient documentation

## 2022-08-09 DIAGNOSIS — F988 Other specified behavioral and emotional disorders with onset usually occurring in childhood and adolescence: Secondary | ICD-10-CM | POA: Insufficient documentation

## 2022-08-09 DIAGNOSIS — W44B9XA Other plastic object entering into or through a natural orifice, initial encounter: Secondary | ICD-10-CM | POA: Insufficient documentation

## 2022-08-09 DIAGNOSIS — F4310 Post-traumatic stress disorder, unspecified: Secondary | ICD-10-CM | POA: Insufficient documentation

## 2022-08-09 DIAGNOSIS — E039 Hypothyroidism, unspecified: Secondary | ICD-10-CM | POA: Insufficient documentation

## 2022-08-09 DIAGNOSIS — E119 Type 2 diabetes mellitus without complications: Secondary | ICD-10-CM | POA: Insufficient documentation

## 2022-08-09 DIAGNOSIS — F319 Bipolar disorder, unspecified: Secondary | ICD-10-CM | POA: Insufficient documentation

## 2022-08-09 DIAGNOSIS — F5089 Other specified eating disorder: Secondary | ICD-10-CM | POA: Insufficient documentation

## 2022-08-09 MED ORDER — ALUM & MAG HYDROXIDE-SIMETH 200-200-20 MG/5ML PO SUSP
30.0000 mL | Freq: Once | ORAL | Status: AC
  Administered 2022-08-09: 30 mL via ORAL
  Filled 2022-08-09: qty 30

## 2022-08-09 MED ORDER — ONDANSETRON 4 MG PO TBDP
4.0000 mg | ORAL_TABLET | Freq: Once | ORAL | Status: AC
  Administered 2022-08-09: 4 mg via ORAL
  Filled 2022-08-09: qty 1

## 2022-08-09 MED ORDER — LIDOCAINE VISCOUS HCL 2 % MT SOLN
15.0000 mL | Freq: Once | OROMUCOSAL | Status: AC
  Administered 2022-08-09: 15 mL via ORAL
  Filled 2022-08-09: qty 15

## 2022-08-09 MED ORDER — LORAZEPAM 1 MG PO TABS
1.0000 mg | ORAL_TABLET | Freq: Once | ORAL | Status: DC
  Filled 2022-08-09: qty 1

## 2022-08-09 NOTE — ED Notes (Signed)
Pt able to tolerate sips of water.
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
## 2022-08-09 NOTE — Progress Notes (Signed)
Vaughan Basta,  Please let Ms. Jani (via her caretaker/guardian) know that her hgb is back to normal.  Her iron levels were slightly low, but I do not think she needs to take any iron supplementation.  Her CRP (inflammatory marker) was normal. No new recommendations
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
## 2022-08-09 NOTE — ED Notes (Signed)
Contacted pts legal guardian and she is coming to pick up pt.  She stated she has paperwork for Dr to complete.
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
## 2022-08-09 NOTE — Discharge Instructions (Addendum)
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
## 2022-08-09 NOTE — ED Notes (Signed)
Pts legal guardian is here to pick her up.  Pt is yelling at everyone that she wants to go to t

## 2022-08-09 NOTE — ED Notes (Signed)
Rn offered the patient Ativan and she refused the medication.

## 2022-08-09 NOTE — Discharge Instructions (Addendum)
It was a pleasure caring for you today in the emergency department.  Please return to the emergency department for any worsening or worrisome symptoms.  

## 2022-08-09 NOTE — ED Notes (Signed)
Pt seen leaving ED with her legal guardian.

## 2022-09-11 ENCOUNTER — Other Ambulatory Visit: Payer: Self-pay | Admitting: Nurse Practitioner

## 2022-09-11 DIAGNOSIS — N6452 Nipple discharge: Secondary | ICD-10-CM

## 2022-10-11 ENCOUNTER — Emergency Department (HOSPITAL_COMMUNITY)
Admission: EM | Admit: 2022-10-11 | Discharge: 2022-10-12 | Disposition: A | Discharge status: Nursing Home (Includes ICF) | Payer: Medicaid Other | Attending: Emergency Medicine | Admitting: Emergency Medicine

## 2022-10-11 ENCOUNTER — Encounter (HOSPITAL_COMMUNITY): Payer: Self-pay | Admitting: Emergency Medicine

## 2022-10-11 ENCOUNTER — Emergency Department (HOSPITAL_COMMUNITY): Payer: Medicaid Other

## 2022-10-11 ENCOUNTER — Other Ambulatory Visit: Payer: Self-pay

## 2022-10-11 DIAGNOSIS — Z1152 Encounter for screening for COVID-19: Secondary | ICD-10-CM | POA: Diagnosis not present

## 2022-10-11 DIAGNOSIS — T189XXA Foreign body of alimentary tract, part unspecified, initial encounter: Secondary | ICD-10-CM

## 2022-10-11 DIAGNOSIS — R0789 Other chest pain: Secondary | ICD-10-CM | POA: Diagnosis not present

## 2022-10-11 DIAGNOSIS — Z79899 Other long term (current) drug therapy: Secondary | ICD-10-CM | POA: Diagnosis not present

## 2022-10-11 DIAGNOSIS — E119 Type 2 diabetes mellitus without complications: Secondary | ICD-10-CM | POA: Insufficient documentation

## 2022-10-11 DIAGNOSIS — I10 Essential (primary) hypertension: Secondary | ICD-10-CM | POA: Diagnosis not present

## 2022-10-11 DIAGNOSIS — R600 Localized edema: Secondary | ICD-10-CM | POA: Insufficient documentation

## 2022-10-11 DIAGNOSIS — F989 Unspecified behavioral and emotional disorders with onset usually occurring in childhood and adolescence: Secondary | ICD-10-CM | POA: Diagnosis present

## 2022-10-11 DIAGNOSIS — R4689 Other symptoms and signs involving appearance and behavior: Secondary | ICD-10-CM

## 2022-10-11 DIAGNOSIS — Z8659 Personal history of other mental and behavioral disorders: Secondary | ICD-10-CM

## 2022-10-11 LAB — URINALYSIS, ROUTINE W REFLEX MICROSCOPIC
Bilirubin Urine: NEGATIVE
Glucose, UA: NEGATIVE mg/dL
Hgb urine dipstick: NEGATIVE
Ketones, ur: 5 mg/dL — AB
Leukocytes,Ua: NEGATIVE
Nitrite: NEGATIVE
Protein, ur: NEGATIVE mg/dL
Specific Gravity, Urine: 1.025 (ref 1.005–1.030)
pH: 6 (ref 5.0–8.0)

## 2022-10-11 LAB — CBG MONITORING, ED: Glucose-Capillary: 86 mg/dL (ref 70–99)

## 2022-10-11 MED ORDER — LEVOTHYROXINE SODIUM 88 MCG PO TABS
88.0000 ug | ORAL_TABLET | Freq: Every day | ORAL | Status: DC
  Administered 2022-10-12: 88 ug via ORAL
  Filled 2022-10-11: qty 1

## 2022-10-11 MED ORDER — ACETAMINOPHEN 500 MG PO TABS
1000.0000 mg | ORAL_TABLET | Freq: Four times a day (QID) | ORAL | Status: DC | PRN
  Administered 2022-10-12: 1000 mg via ORAL
  Filled 2022-10-11: qty 2

## 2022-10-11 MED ORDER — MONTELUKAST SODIUM 10 MG PO TABS
10.0000 mg | ORAL_TABLET | Freq: Every day | ORAL | Status: DC
  Filled 2022-10-11 (×2): qty 1

## 2022-10-11 MED ORDER — LORAZEPAM 1 MG PO TABS
1.0000 mg | ORAL_TABLET | Freq: Once | ORAL | Status: AC
  Administered 2022-10-12: 1 mg via ORAL
  Filled 2022-10-11: qty 1

## 2022-10-11 MED ORDER — DIVALPROEX SODIUM ER 500 MG PO TB24
500.0000 mg | ORAL_TABLET | Freq: Two times a day (BID) | ORAL | Status: DC
  Administered 2022-10-12 (×2): 500 mg via ORAL
  Filled 2022-10-11 (×2): qty 1

## 2022-10-11 MED ORDER — ACETAMINOPHEN 500 MG PO TABS
1000.0000 mg | ORAL_TABLET | Freq: Once | ORAL | Status: AC
  Administered 2022-10-11: 1000 mg via ORAL
  Filled 2022-10-11: qty 2

## 2022-10-11 MED ORDER — GABAPENTIN 300 MG PO CAPS: 300.0000 mg | ORAL_CAPSULE | ORAL | Status: DC

## 2022-10-11 MED ORDER — ALBUTEROL SULFATE HFA 108 (90 BASE) MCG/ACT IN AERS: 2.0000 | INHALATION_SPRAY | Freq: Four times a day (QID) | RESPIRATORY_TRACT | Status: DC | PRN

## 2022-10-11 MED ORDER — METOPROLOL SUCCINATE ER 50 MG PO TB24
100.0000 mg | ORAL_TABLET | Freq: Every day | ORAL | Status: DC
  Administered 2022-10-12: 100 mg via ORAL
  Filled 2022-10-11: qty 2

## 2022-10-11 NOTE — ED Notes (Signed)
Pt. Assisted to bathroom with assistance for safety.
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
## 2022-10-11 NOTE — Discharge Instructions (Addendum)
It was our pleasure to provide your ER care today - we hope that you feel better.  Make sure to follow the rules and instructions are your home and be respectful of the staff at all times. Avoid swallowing anything other than the food and liquids provided for your nutrition.   Follow up closely with primary care doctor and behavioral health provider in the coming week - call tomorrow AM to arrange follow up. Also follow up with your doctor regarding your blood pressure.   For mental health issues and/or crisis, you may also go directly to the [facility] - they are open 24/7 and walk-ins are welcome.    Return to ER if worse, new symptoms, fevers, chest pain, trouble breathing, severe abdominal pain, persistent vomiting, or other emergency concern.

## 2022-10-11 NOTE — ED Provider Notes (Addendum)
[facility] EMERGENCY DEPARTMENT AT [facility] Provider Note   CSN: [id number] Arrival date & time: 10/11/22  1820     History  Chief Complaint  Patient presents with   behavioral issue    Megan Manning is a 42 y.o. female.  Patient from group home, with episodic agitated behavior, and recently ingested two quarters 3/11, and possibly ingested plastic baggie. Pt with history pica. Pt denies ingestion. Pt denies pain or nausea/vomiting. No chest pain or sob. Pt denies any thoughts of harm to self or others. Indicates normal appetite.   The history is provided by the patient, medical records and the EMS personnel. The history is limited by the condition of the patient.       Home Medications Prior to Admission medications   Medication Sig Start Date End Date Taking? Authorizing Provider  acetaminophen (TYLENOL) 500 MG tablet Take 1,000 mg by mouth every 6 (six) hours as needed for moderate pain.    [provider]  albuterol (PROVENTIL) (2.5 MG/3ML) 0.083% nebulizer solution Take 3 mLs (2.5 mg total) by nebulization every 6 (six) hours as needed for wheezing or shortness of breath. 08/07/22   Montine Circle, PA-C  albuterol (VENTOLIN HFA) 108 (90 Base) MCG/ACT inhaler Inhale 2 puffs into the lungs every 6 (six) hours as needed for wheezing or shortness of breath.  07/13/19   [provider]  atorvastatin (LIPITOR) 20 MG tablet Take 20 mg by mouth at bedtime. 03/09/21   [provider]  Azelastine HCl (ASTEPRO) 0.15 % SOLN Place 1 spray into the nose 2 (two) times daily.    [provider]  divalproex (DEPAKOTE ER) 500 MG 24 hr tablet Take 500 mg by mouth 2 (two) times daily.    [provider]  furosemide (LASIX) 40 MG tablet Take 0.5 tablets (20 mg total) by mouth daily. 06/29/22   Elgergawy, Silver Huguenin, MD  gabapentin (NEURONTIN) 300 MG capsule Take 300-600 mg by mouth See admin instructions. Give 1 capsule ( 300mg )  in the  morning and 2 (600 mg) capsules at bedtime    [provider]  INVEGA SUSTENNA 234 MG/1.5ML injection Inject 234 mg into the muscle every 28 (twenty-eight) days.    [provider]  levothyroxine (SYNTHROID) 88 MCG tablet Take 1 tablet (88 mcg total) by mouth daily. 11/17/18   Chase Picket, MD  loratadine (CLARITIN) 10 MG tablet Take 1 tablet (10 mg total) by mouth daily. 03/16/17   Clapacs, Madie Reno, MD  LORazepam (ATIVAN) 1 MG tablet Take 1 tablet (1 mg total) by mouth every 8 (eight) hours as needed for anxiety. 06/29/22   Elgergawy, Silver Huguenin, MD  medroxyPROGESTERone (DEPO-PROVERA) 150 MG/ML injection Inject 150 mg into the muscle every 3 (three) months. 05/04/22   [provider]  metoprolol succinate (TOPROL-XL) 100 MG 24 hr tablet Take 1 tablet (100 mg total) by mouth daily. Take with or immediately following a meal. 06/30/22   Elgergawy, Silver Huguenin, MD  montelukast (SINGULAIR) 10 MG tablet Take 10 mg by mouth at bedtime.    [provider]  nystatin (MYCOSTATIN/NYSTOP) powder Apply 1 Application topically 2 (two) times daily.    [provider]  Olopatadine HCl 0.2 % SOLN Place 1 drop into both eyes in the morning and at bedtime. Patient not taking: Reported on 08/06/2022    [provider]  omeprazole (PRILOSEC) 20 MG capsule Take 1 capsule (20 mg total) by mouth daily. 03/16/17   Clapacs,  Madie Reno, MD  polyethylene glycol (MIRALAX / GLYCOLAX) 17 g packet Take 17 g by mouth daily as needed. 06/29/22   Elgergawy, Silver Huguenin, MD  potassium chloride SA (KLOR-CON M) 20 MEQ tablet Take 0.5 tablets (10 mEq total) by mouth daily. 06/29/22   Elgergawy, Silver Huguenin, MD  senna-docusate (SENOKOT-S) 8.6-50 MG tablet Take 1 tablet by mouth at bedtime. 06/29/22   Elgergawy, Silver Huguenin, MD  Vitamin D, Ergocalciferol, (DRISDOL) 1.25 MG (50000 UNIT) CAPS capsule Take 50,000 Units by mouth every 7 (seven) days. 05/27/22   [provider]      Allergies     Chlorpromazine    Review of Systems   Review of Systems  Constitutional:  Negative for fever.  Respiratory:  Negative for shortness of breath.   Cardiovascular:  Negative for chest pain.  Gastrointestinal:  Negative for abdominal pain and vomiting.  Neurological:  Negative for headaches.  Psychiatric/Behavioral:  Negative for suicidal ideas.     Physical Exam Updated Vital Signs BP (!) 162/91 (BP Location: Left Arm)   Pulse (!) 101   Temp 100.1 F (37.8 C) (Oral)   Resp 20   Ht 1.6 m (5\' 3" )   Wt 136.1 kg   SpO2 97%   BMI 53.14 kg/m  Physical Exam Vitals and nursing note reviewed.  Constitutional:      Appearance: Normal appearance. She is well-developed.  HENT:     Head: Atraumatic.     Nose: Nose normal.     Mouth/Throat:     Mouth: Mucous membranes are moist.     Pharynx: Oropharynx is clear.  Eyes:     General: No scleral icterus.    Conjunctiva/sclera: Conjunctivae normal.     Pupils: Pupils are equal, round, and reactive to light.  Neck:     Trachea: No tracheal deviation.  Cardiovascular:     Rate and Rhythm: Normal rate and regular rhythm.     Pulses: Normal pulses.     Heart sounds: Normal heart sounds. No murmur heard.    No friction rub. No gallop.  Pulmonary:     Effort: Pulmonary effort is normal. No respiratory distress.     Breath sounds: Normal breath sounds.  Abdominal:     General: There is no distension.     Palpations: Abdomen is soft.     Tenderness: There is no abdominal tenderness.  Musculoskeletal:        General: No swelling.     Cervical back: Normal range of motion and neck supple. No rigidity. No muscular tenderness.  Skin:    General: Skin is warm and dry.     Findings: No rash.  Neurological:     Mental Status: She is alert.     Comments: Alert, speech normal. Motor/sens grossly intact bil.   Psychiatric:        Mood and Affect: Mood normal.     Comments: Normal mood/affect. Conversant. Denies thoughts of harm to self or  others. Pt does not appear to be responding to internal stimuli - no delusions or hallucinations noted.      ED Results / Procedures / Treatments   Labs (all labs ordered are listed, but only abnormal results are displayed) Labs Reviewed - No data to display  EKG None  Radiology No results found.  Procedures Procedures    Medications Ordered in ED Medications - No data to display  ED Course/ Medical Decision Making/ A&P  Medical Decision Making Problems Addressed: Behavior symptom: acute illness or injury    Details: Acute/chronic, intermittent.  Essential hypertension: chronic illness or injury History of pica: chronic illness or injury Swallowed foreign body, initial encounter: acute illness or injury    Details: Acute on chronic  Amount and/or Complexity of Data Reviewed Independent Historian: EMS    Details: hx External Data Reviewed: notes. Labs: ordered. Decision-making details documented in ED Course. Radiology: ordered and independent interpretation performed. Decision-making details documented in ED Course.  Risk OTC drugs.   Lab and imaging ordered.   Reviewed nursing notes and prior charts for additional history.   Labs reviewed/interpreted by me - glucose ok.   Xrays reviewed/interpreted by me - no radioopaque fb.   Pt asking for something to drink - provided.   On review prior charts, pts earlier behavior appears c/w baseline.  Pt continues to exhibit normal mood/affect, is calm, cooperative, and continues to deny any thoughts of harm to self or others. Is able to eat/drink w no nv.   Recheck, remains calm, cooperative. No acute psychosis noted. Pt continues to deny any thoughts of harm to self or others.   Pt currently appears stable for d/c.  Rec close outpatient f/u with pcp and her bh provider.   Return precautions provided.   RN indicates group home inappropriately refusing to take back - will consult TOC  to facilitate return to group home (reinforcing that state regs require minimum of 30 days notice, and for group home/guardian/LME to find new home, etc). May need to facilitate return via Event organiser and/or file grievance.   Recheck, pt alert, content. No distress.         Final Clinical Impression(s) / ED Diagnoses Final diagnoses:  None    Rx / DC Orders ED Discharge Orders     None         Lajean Saver, MD 10/11/22 [id number]    Lajean Saver, MD 10/11/22 2203
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
## 2022-10-11 NOTE — ED Triage Notes (Signed)
Per [facility] pt coming from a group home. Patient states she ate a small Ziploc bag about 2 hours ago that she found in her purse. Patient endorses SI and HI thoughts. Staff states patient has been trying to put her fingers in light sockets and ate two quarters on 3/11.

## 2022-10-11 NOTE — ED Notes (Signed)
ED Provider at bedside. 

## 2022-10-12 ENCOUNTER — Emergency Department (HOSPITAL_COMMUNITY)
Admission: EM | Admit: 2022-10-12 | Discharge: 2022-10-13 | Disposition: A | Discharge status: Home / Self Care | Payer: Medicaid Other | Source: Home / Self Care | Attending: Emergency Medicine | Admitting: Emergency Medicine

## 2022-10-12 ENCOUNTER — Emergency Department (HOSPITAL_COMMUNITY): Payer: Medicaid Other

## 2022-10-12 ENCOUNTER — Other Ambulatory Visit: Payer: Self-pay

## 2022-10-12 DIAGNOSIS — E119 Type 2 diabetes mellitus without complications: Secondary | ICD-10-CM | POA: Insufficient documentation

## 2022-10-12 DIAGNOSIS — I10 Essential (primary) hypertension: Secondary | ICD-10-CM | POA: Insufficient documentation

## 2022-10-12 DIAGNOSIS — R0789 Other chest pain: Secondary | ICD-10-CM | POA: Insufficient documentation

## 2022-10-12 DIAGNOSIS — R600 Localized edema: Secondary | ICD-10-CM | POA: Insufficient documentation

## 2022-10-12 DIAGNOSIS — Z79899 Other long term (current) drug therapy: Secondary | ICD-10-CM | POA: Insufficient documentation

## 2022-10-12 LAB — BASIC METABOLIC PANEL
Anion gap: 8 (ref 5–15)
Anion gap: 5 (ref 5–15)
BUN: 16 mg/dL (ref 6–20)
BUN: 17 mg/dL (ref 6–20)
CO2: 24 mmol/L (ref 22–32)
CO2: 25 mmol/L (ref 22–32)
Calcium: 9 mg/dL (ref 8.9–10.3)
Calcium: 8.6 mg/dL — ABNORMAL LOW (ref 8.9–10.3)
Chloride: 108 mmol/L (ref 98–111)
Chloride: 108 mmol/L (ref 98–111)
Creatinine, Ser: 0.72 mg/dL (ref 0.44–1.00)
Creatinine, Ser: 0.71 mg/dL (ref 0.44–1.00)
GFR, Estimated: >60 mL/min (ref >60)
GFR, Estimated: >60 mL/min (ref >60)
Glucose, Bld: 124 mg/dL — ABNORMAL HIGH (ref 70–99)
Glucose, Bld: 97 mg/dL (ref 70–99)
Potassium: 4.1 mmol/L (ref 3.5–5.1)
Potassium: 4.4 mmol/L (ref 3.5–5.1)
Sodium: 140 mmol/L (ref 135–145)
Sodium: 138 mmol/L (ref 135–145)

## 2022-10-12 LAB — CBC WITH DIFFERENTIAL/PLATELET
Abs Immature Granulocytes: 0.04 10*3/uL (ref 0.00–0.07)
Abs Immature Granulocytes: 0.01 10*3/uL (ref 0.00–0.07)
Basophils Absolute: 0 10*3/uL (ref 0.0–0.1)
Basophils Absolute: 0 10*3/uL (ref 0.0–0.1)
Basophils Relative: 0 %
Basophils Relative: 0 %
Eosinophils Absolute: 0.1 10*3/uL (ref 0.0–0.5)
Eosinophils Absolute: 0.1 10*3/uL (ref 0.0–0.5)
Eosinophils Relative: 1 %
Eosinophils Relative: 1 %
HCT: 35.2 % — ABNORMAL LOW (ref 36.0–46.0)
HCT: 37.2 % (ref 36.0–46.0)
Hemoglobin: 10.7 g/dL — ABNORMAL LOW (ref 12.0–15.0)
Hemoglobin: 11.5 g/dL — ABNORMAL LOW (ref 12.0–15.0)
Immature Granulocytes: 1 %
Immature Granulocytes: 0 %
Lymphocytes Relative: 36 %
Lymphocytes Relative: 39 %
Lymphs Abs: 1.8 10*3/uL (ref 0.7–4.0)
Lymphs Abs: 2.2 10*3/uL (ref 0.7–4.0)
MCH: 26.9 pg (ref 26.0–34.0)
MCH: 27.1 pg (ref 26.0–34.0)
MCHC: 30.4 g/dL (ref 30.0–36.0)
MCHC: 30.9 g/dL (ref 30.0–36.0)
MCV: 88.4 fL (ref 80.0–100.0)
MCV: 87.5 fL (ref 80.0–100.0)
Monocytes Absolute: 0.5 10*3/uL (ref 0.1–1.0)
Monocytes Absolute: 0.4 10*3/uL (ref 0.1–1.0)
Monocytes Relative: 9 %
Monocytes Relative: 7 %
Neutro Abs: 2.7 10*3/uL (ref 1.7–7.7)
Neutro Abs: 2.9 10*3/uL (ref 1.7–7.7)
Neutrophils Relative %: 53 %
Neutrophils Relative %: 53 %
Platelets: 224 10*3/uL (ref 150–400)
Platelets: 235 10*3/uL (ref 150–400)
RBC: 3.98 MIL/uL (ref 3.87–5.11)
RBC: 4.25 MIL/uL (ref 3.87–5.11)
RDW: 13.3 % (ref 11.5–15.5)
RDW: 13.4 % (ref 11.5–15.5)
WBC: 5.1 10*3/uL (ref 4.0–10.5)
WBC: 5.6 10*3/uL (ref 4.0–10.5)
nRBC: 0 % (ref 0.0–0.2)
nRBC: 0 % (ref 0.0–0.2)

## 2022-10-12 LAB — RESP PANEL BY RT-PCR (RSV, FLU A&B, COVID)  RVPGX2
Influenza A by PCR: NEGATIVE
Influenza B by PCR: NEGATIVE
Resp Syncytial Virus by PCR: NEGATIVE
SARS Coronavirus 2 by RT PCR: NEGATIVE

## 2022-10-12 LAB — CBC
HCT: 37.1 % (ref 36.0–46.0)
Hemoglobin: 11.1 g/dL — ABNORMAL LOW (ref 12.0–15.0)
MCH: 26.7 pg (ref 26.0–34.0)
MCHC: 29.9 g/dL — ABNORMAL LOW (ref 30.0–36.0)
MCV: 89.2 fL (ref 80.0–100.0)
Platelets: 224 10*3/uL (ref 150–400)
RBC: 4.16 MIL/uL (ref 3.87–5.11)
RDW: 13.4 % (ref 11.5–15.5)
WBC: 5.4 10*3/uL (ref 4.0–10.5)
nRBC: 0 % (ref 0.0–0.2)

## 2022-10-12 LAB — D-DIMER, QUANTITATIVE: D-Dimer, Quant: 0.28 ug/mL-FEU (ref 0.00–0.50)

## 2022-10-12 LAB — TROPONIN I (HIGH SENSITIVITY): Troponin I (High Sensitivity): 3 ng/L (ref <18)

## 2022-10-12 MED ORDER — SODIUM CHLORIDE 0.9 % IV BOLUS
1000.0000 mL | Freq: Once | INTRAVENOUS | Status: AC
  Administered 2022-10-12: 1000 mL via INTRAVENOUS

## 2022-10-12 MED ORDER — IBUPROFEN 200 MG PO TABS
600.0000 mg | ORAL_TABLET | Freq: Once | ORAL | Status: AC
  Administered 2022-10-12: 600 mg via ORAL
  Filled 2022-10-12: qty 3

## 2022-10-12 MED ORDER — ONDANSETRON 4 MG PO TBDP
4.0000 mg | ORAL_TABLET | Freq: Three times a day (TID) | ORAL | Status: DC | PRN
  Administered 2022-10-12: 4 mg via ORAL
  Filled 2022-10-12: qty 1

## 2022-10-12 MED ORDER — GABAPENTIN 300 MG PO CAPS
300.0000 mg | ORAL_CAPSULE | Freq: Every day | ORAL | Status: DC
  Administered 2022-10-12: 300 mg via ORAL
  Filled 2022-10-12: qty 1

## 2022-10-12 MED ORDER — GABAPENTIN 300 MG PO CAPS: 600.0000 mg | ORAL_CAPSULE | Freq: Every day | ORAL | Status: DC

## 2022-10-12 NOTE — ED Triage Notes (Signed)
Patient here requesting re-evaluation of chest and bilateral leg pain. Patient seen at [facility] and discharged approximately one hour ago for same. Patient denies any changes in symptoms, is alert and in no apparent distress.

## 2022-10-12 NOTE — ED Notes (Signed)
Patient c/o chest pain and discomfort states its a sharp pain in middle of chest denies numbness or tingling and denies pain radiating, MD notified, EKG ordered

## 2022-10-12 NOTE — ED Notes (Addendum)
Multiple attempts to get IV access and blood by multiple ED staff. MD aware. IV team consult  placed.

## 2022-10-12 NOTE — ED Provider Notes (Signed)
11:22 AM Patient is complaining of chest pain.  States it just started and feels sharp.  She states she has a prior history of blood clots and this feels similar.  From an abdominal perspective she has a benign abdomen on my exam.  EKG shows some sinus tachycardia.  Will do labs including troponin and D-dimer though otherwise she is not hypoxic, hypotensive, etc.   EKG Interpretation  Date/Time:  Monday October 12 2022 11:05:21 EDT Ventricular Rate:  105 PR Interval:  120 QRS Duration: 76 QT Interval:  340 QTC Calculation: 449 R Axis:   173 Text Interpretation: Sinus tachycardia Arm leads are reversed - please discard Confirmed by Sherwood Gambler [phone]) on 10/12/2022 11:23:50 AM        3:23 PM Patient is overall appearing well.  After discussion with patient, she has chronic chest pain, comes and goes often.  I doubt she is having acute ischemia.  Her EKG is tachycardic but chart review shows she is often tachycardic.  D-dimer is negative and chart review seems to show that she did not have a PE back in the fall.  I have low suspicion for ACS, PE, dissection.  This seems more like a chronic chest pain which she admits to.  Otherwise, the group home is ready to take her back and I do not think she has an acute medical condition and is stable for discharge.   Sherwood Gambler, MD 10/12/22 1524

## 2022-10-12 NOTE — ED Provider Notes (Signed)
Emergency Medicine Observation Re-evaluation Note  Megan Manning is a 42 y.o. female, seen on rounds today.  Pt initially presented to the ED for complaints of behavioral issue Currently, the patient is resting.  Complaining of a little bit of nausea that she associates with the coin she ingested.Marland Kitchen  Physical Exam  BP 124/87 (BP Location: Left Arm)   Pulse (!) 103   Temp 98.7 F (37.1 C) (Oral)   Resp 20   Ht 5\' 3"  (1.6 m)   Wt 136.1 kg   SpO2 95%   BMI 53.14 kg/m  Physical Exam General: No acute distress Lungs: Normal effort Psych: No agitation  ED Course / MDM  EKG:   I have reviewed the labs performed to date as well as medications administered while in observation.  Recent changes in the last 24 hours include Home meds.  Plan  Current plan is for TOC to help with group home.    Sherwood Gambler, MD 10/12/22 [phone]

## 2022-10-12 NOTE — ED Notes (Signed)
Breakfast sandwich and ginger ale given to pt

## 2022-10-12 NOTE — ED Provider Notes (Signed)
  10:38 PM Brought back to triage for new development of hypoxia while in the waiting room.  She did have negative work-up at [facility] earlier today including troponins and d-dimer.  She is currently on 2L saturating in mid 90's.  She does have a dry cough on exam.  She does not have any wheezing currently.  RVP yesterday was negative.  Will repeat CXR and labs given acute change in vitals.    She reported hx of blood clots in the past, however it appears her prior scans were actually negative.  She did have negative d-dimer earlier today as well.   Larene Pickett, PA-C 10/12/22 2244    Teressa Lower, MD 10/13/22 1350

## 2022-10-12 NOTE — ED Notes (Signed)
Pt placed on 2L of oxygen. 

## 2022-10-12 NOTE — Progress Notes (Addendum)
This CSW attempted to contact [employer] [phone], ext [phone]) without success. Left HIPAA Compliant voicemail.  Addend @ 11:06 AM Spoke with Kenyatta who is listed in the pt's chart as a Legal Guardian. Kenyatta reported she was the pt's provider. States she is trying to reach her QP. She provided another contact # for [employer] [phone]).  Addend @ 11:14 AM Attempted to contact Kelly at [phone] without success. Left HIPAA Compliant voicemail requesting call back.

## 2022-10-12 NOTE — Progress Notes (Addendum)
This CSW received a call back from [place]. Kenyatta asked if anyone has spoken to [place], the owner of the group home. This Probation officer informed that there is not a contact for Kim in the group chat and per notes, a Paula Libra was spoken to. Kenyatta informed she is going to locate Kim's number and also see if she has a number for [employer] supervisor at the [employer]. This Probation officer has left HIPAA Compliant voicemails on both numbers listed and provided for [employer] TOC following.   Addend @ 1:21 PM [place] [phone]) and spoke with [name]. [name] stated their team was unaware of the [facility] not being willing to take the pt back. [name] states she will contact Maudie Mercury who is the owner and give this Probation officer a call back. TOC following.
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
## 2022-10-12 NOTE — Progress Notes (Addendum)
Megan Manning, QP, at [facility] stated that there was miscommunication last night. Per Megan Manning was told that the pt had not received any medication and Rachel's concern was the pt's behaviors due to being non compliant with medications in the home. Dontae inquired about the pt being ready for discharge and this writer informed that the pt is medically stable and that all information from her visit will be on the After Visit Summary. Dontae verbalized understanding and is contact Paula Libra to pick the pt up.

## 2022-10-12 NOTE — ED Notes (Signed)
Transportation from [facility] home came to retrieve patient

## 2022-10-12 NOTE — ED Notes (Signed)
PA Lattie Haw made aware of hypoxia and oxygen requirement. Pt brought back for reevaluation.

## 2022-10-12 NOTE — ED Provider Triage Note (Signed)
Emergency Medicine Provider Triage Evaluation Note  Megan Manning , a 42 y.o. female  was evaluated in triage.  Pt complains of chest pain and bilateral leg swelling.  Patient states the symptoms been present for 2 to 3 days and at the chest pain is constant and feels like needles in her chest.  Patient was seen an hour ago at [facility] and discharged after having a negative workup the patient still says chest pain is persistent.  Patient denied any changes in symptoms after being discharged from [facility].  Review of Systems  Positive: See HPI Negative: See HPI  Physical Exam  BP (!) 151/106 (BP Location: Right Arm)   Pulse (!) 111   Temp 98.8 F (37.1 C)   Resp (!) 22   SpO2 95%  Gen:   Awake, no distress   Resp:  Normal effort  MSK:   Moves extremities without difficulty  Other:  Abd non tender to palpation  Medical Decision Making  Medically screening exam initiated at 6:43 PM.  Appropriate orders placed.  Megan Manning was informed that the remainder of the evaluation will be completed by another provider, this initial triage assessment does not replace that evaluation, and the importance of remaining in the ED until their evaluation is complete.  No labs ordered as patient was just discharged from [facility] for the same concern, patient stable at this time   Megan Manning 10/12/22 1848

## 2022-10-12 NOTE — ED Notes (Signed)
RN attempted to call Donte at [phone], VM left.

## 2022-10-13 ENCOUNTER — Emergency Department (HOSPITAL_COMMUNITY): Payer: Medicaid Other

## 2022-10-13 LAB — BASIC METABOLIC PANEL
Anion gap: 8 (ref 5–15)
BUN: 16 mg/dL (ref 6–20)
CO2: 28 mmol/L (ref 22–32)
Calcium: 9 mg/dL (ref 8.9–10.3)
Chloride: 107 mmol/L (ref 98–111)
Creatinine, Ser: 0.77 mg/dL (ref 0.44–1.00)
GFR, Estimated: >60 mL/min (ref >60)
Glucose, Bld: 101 mg/dL — ABNORMAL HIGH (ref 70–99)
Potassium: 4.5 mmol/L (ref 3.5–5.1)
Sodium: 143 mmol/L (ref 135–145)

## 2022-10-13 LAB — TROPONIN I (HIGH SENSITIVITY)
Troponin I (High Sensitivity): <2 ng/L (ref <18)
Troponin I (High Sensitivity): 3 ng/L (ref <18)

## 2022-10-13 LAB — BRAIN NATRIURETIC PEPTIDE: B Natriuretic Peptide: 14.6 pg/mL (ref 0.0–100.0)

## 2022-10-13 MED ORDER — IOHEXOL 350 MG/ML SOLN
80.0000 mL | Freq: Once | INTRAVENOUS | Status: AC | PRN
  Administered 2022-10-13: 80 mL via INTRAVENOUS

## 2022-10-13 NOTE — ED Notes (Addendum)
Patient back from CT, placed on 2L o2 [place] due to drop[ping o2 sats to 89%, VS now stable, call bell in reach.

## 2022-10-13 NOTE — ED Notes (Signed)
Pt report received from previous nurse. Pt A&O x4, vitals stable, denies needs/complaints. Call bell in reach. No acute distress noted.  

## 2022-10-13 NOTE — Discharge Instructions (Addendum)
You were seen today for chest pain.  Your workup is reassuring including evaluation for blood clots.  Follow-up with cardiology.

## 2022-10-13 NOTE — ED Notes (Signed)
Patient transported to CT 

## 2022-10-13 NOTE — ED Notes (Signed)
Legal guardian, Paula Libra, instructed night shift RN to call Louanne Belton, backup. Called him, he will be here to pick up patient around 0915/0930

## 2022-10-13 NOTE — ED Provider Notes (Signed)
[facility] Provider Note   CSN: [id number] Arrival date & time: 10/12/22  1829     History  Chief Complaint  Patient presents with   Chest Pain    Megan Manning is a 42 y.o. female.  HPI     This is a 43 year old female with a history of diabetes, hypertension, borderline personality and adjustment disorder who presents with chest pain.  Patient was being held at the [facility], ER after she ingested foreign bodies.  She was cleared by TTS earlier today.  She began to have some chest discomfort.  She was worked up at that time with cardiac enzymes and D-dimer which were negative.  She was discharged back to her group home.  However, she continued to complain of pain and presents here for the same.  On my evaluation she states that she has had ongoing chest discomfort that she describes as sharp.  Is unchanged since her last evaluation at [facility] long.  Reports remote history of PE but is not on any blood thinners.  Had a D-dimer earlier that was negative.  Does report some leg swelling but is morbidly obese.  Home Medications Prior to Admission medications   Medication Sig Start Date End Date Taking? Authorizing Provider  acetaminophen (TYLENOL) 500 MG tablet Take 1,000 mg by mouth every 6 (six) hours as needed for moderate pain. Take 1,000 mg by mouth every 6-8 hours as needed for discomfort    [provider]  acetaminophen (TYLENOL) 650 MG CR tablet Take 650 mg by mouth every 8 (eight) hours as needed for pain.    [provider]  albuterol (PROVENTIL) (2.5 MG/3ML) 0.083% nebulizer solution Take 3 mLs (2.5 mg total) by nebulization every 6 (six) hours as needed for wheezing or shortness of breath. 08/07/22   Montine Circle, PA-C  albuterol (VENTOLIN HFA) 108 (90 Base) MCG/ACT inhaler Inhale 2 puffs into the lungs every 6 (six) hours as needed for wheezing (or coughing). 07/13/19   [provider]  ammonium  lactate (LAC-HYDRIN) 12 % lotion Apply 1 Application topically See admin instructions. Apply to the hands and feet 2 times a day    [provider]  asenapine (SAPHRIS) 5 MG SUBL 24 hr tablet Place 5 mg under the tongue 2 (two) times daily as needed (for agitation).    [provider]  ASTEPRO 205.5 MCG/SPRAY SOLN Place 1 spray into both nostrils 2 (two) times daily.    [provider]  atorvastatin (LIPITOR) 20 MG tablet Take 20 mg by mouth at bedtime. 03/09/21   [provider]  diclofenac Sodium (VOLTAREN) 1 % GEL Apply topically as directed.    [provider]  divalproex (DEPAKOTE ER) 500 MG 24 hr tablet Take 500 mg by mouth 2 (two) times daily.    [provider]  furosemide (LASIX) 40 MG tablet Take 0.5 tablets (20 mg total) by mouth daily. 06/29/22   Elgergawy, Silver Huguenin, MD  gabapentin (NEURONTIN) 300 MG capsule Take 300-600 mg by mouth See admin instructions. Take 300 mg by mouth in the morning and 600 mg at bedtime    [provider]  INVEGA SUSTENNA 234 MG/1.5ML injection Inject 234 mg into the muscle every 30 (thirty) days.    [provider]  levothyroxine (SYNTHROID) 88 MCG tablet Take 1 tablet (88 mcg total) by mouth daily. Patient taking differently: Take 88 mcg by mouth daily before breakfast. 11/17/18   Jamse Arn  O, MD  loratadine (CLARITIN) 10 MG tablet Take 1 tablet (10 mg total) by mouth daily. 03/16/17   Clapacs, Madie Reno, MD  LORazepam (ATIVAN) 1 MG tablet Take 1 tablet (1 mg total) by mouth every 8 (eight) hours as needed for anxiety. Patient taking differently: Take 1 mg by mouth 3 (three) times daily. 06/29/22   Elgergawy, Silver Huguenin, MD  medroxyPROGESTERone (DEPO-PROVERA) 150 MG/ML injection Inject 150 mg into the muscle every 3 (three) months. 05/04/22   [provider]  metoprolol succinate (TOPROL-XL) 100 MG 24 hr tablet Take 1 tablet (100 mg total) by mouth daily. Take with or immediately  following a meal. 06/30/22   Elgergawy, Silver Huguenin, MD  montelukast (SINGULAIR) 10 MG tablet Take 10 mg by mouth at bedtime.    [provider]  nystatin (MYCOSTATIN/NYSTOP) powder Apply 1 Application topically 2 (two) times daily.    [provider]  Olopatadine HCl 0.2 % SOLN Place 1 drop into both eyes in the morning and at bedtime.    [provider]  omeprazole (PRILOSEC) 20 MG capsule Take 1 capsule (20 mg total) by mouth daily. Patient taking differently: Take 20 mg by mouth in the morning. 03/16/17   Clapacs, Madie Reno, MD  polyethylene glycol (MIRALAX / GLYCOLAX) 17 g packet Take 17 g by mouth daily as needed. Patient taking differently: Take 17 g by mouth daily as needed for mild constipation. 06/29/22   Elgergawy, Silver Huguenin, MD  potassium chloride SA (KLOR-CON M) 20 MEQ tablet Take 0.5 tablets (10 mEq total) by mouth daily. Patient taking differently: Take 20 mEq by mouth daily. 06/29/22   Elgergawy, Silver Huguenin, MD  prazosin (MINIPRESS) 5 MG capsule Take 5 mg by mouth at bedtime.    [provider]  senna-docusate (SENOKOT-S) 8.6-50 MG tablet Take 1 tablet by mouth at bedtime. 06/29/22   Elgergawy, Silver Huguenin, MD  sertraline (ZOLOFT) 50 MG tablet Take 50 mg by mouth every evening.    [provider]  Vitamin D, Ergocalciferol, (DRISDOL) 1.25 MG (50000 UNIT) CAPS capsule Take 50,000 Units by mouth every Thursday. 05/27/22   [provider]      Allergies    Chlorpromazine    Review of Systems   Review of Systems  Constitutional:  Negative for fever.  Respiratory:  Negative for shortness of breath.   Cardiovascular:  Positive for chest pain and leg swelling.  Gastrointestinal:  Negative for abdominal pain.  All other systems reviewed and are negative.   Physical Exam Updated Vital Signs BP 128/84 (BP Location: Right Arm)   Pulse 95   Temp 98.9 F (37.2 C) (Oral)   Resp 18   SpO2 91%  Physical Exam Vitals and nursing note reviewed.   Constitutional:      Appearance: She is well-developed. She is obese. She is not ill-appearing.  HENT:     Head: Normocephalic and atraumatic.  Eyes:     Pupils: Pupils are equal, round, and reactive to light.  Cardiovascular:     Rate and Rhythm: Normal rate and regular rhythm.     Heart sounds: Normal heart sounds.  Pulmonary:     Effort: Pulmonary effort is normal. No respiratory distress.     Breath sounds: No wheezing.     Comments: Distant breath sounds, no wheezing, limited by body habitus Abdominal:     General: Bowel sounds are normal.     Palpations: Abdomen is soft.  Musculoskeletal:     Cervical back: Neck  supple.     Right lower leg: Edema present.     Left lower leg: Edema present.  Skin:    General: Skin is warm and dry.  Neurological:     Mental Status: She is alert and oriented to person, place, and time.  Psychiatric:        Mood and Affect: Mood normal.     ED Results / Procedures / Treatments   Labs (all labs ordered are listed, but only abnormal results are displayed) Labs Reviewed  CBC WITH DIFFERENTIAL/PLATELET - Abnormal; Notable for the following components:      Result Value   Hemoglobin 11.5 (*)    All other components within normal limits  BASIC METABOLIC PANEL - Abnormal; Notable for the following components:   Glucose, Bld 101 (*)    All other components within normal limits  BRAIN NATRIURETIC PEPTIDE  TROPONIN I (HIGH SENSITIVITY)  TROPONIN I (HIGH SENSITIVITY)    EKG None  Radiology CT Angio Chest PE W and/or Wo Contrast  Result Date: 10/13/2022 CLINICAL DATA:  42 year old female with chest and leg pain. Shortness of breath. EXAM: CT ANGIOGRAPHY CHEST WITH CONTRAST TECHNIQUE: Multidetector CT imaging of the chest was performed using the standard protocol during bolus administration of intravenous contrast. Multiplanar CT image reconstructions and MIPs were obtained to evaluate the vascular anatomy. RADIATION DOSE REDUCTION: This  exam was performed according to the departmental dose-optimization program which includes automated exposure control, adjustment of the mA and/or kV according to patient size and/or use of iterative reconstruction technique. CONTRAST:  53mL OMNIPAQUE IOHEXOL 350 MG/ML SOLN COMPARISON:  Chest radiographs yesterday. Chest CTA and CT Abdomen and Pelvis 06/19/2022. FINDINGS: Cardiovascular: Suboptimal but adequate contrast bolus timing in the pulmonary arterial tree. Respiratory motion, and chronically elevated right hemidiaphragm. No central or lobar pulmonary artery filling defect. No convincing hilar pulmonary artery filling defect but otherwise the segmental through distal branches are not well evaluated. No cardiomegaly or pericardial effusion. Visible aorta appears stable and negative. Mediastinum/Nodes: Stable and negative. Lungs/Pleura: Chronic elevation of the right hemidiaphragm subsequent multilobar platelike atelectasis in the right lung. This appears to be chronic, but progressed from 2011 radiographs. Superimposed respiratory motion. Major airways remain patent. The left costophrenic angle is not entirely included today. Mild left lower lobe linear atelectasis appears unchanged from last year. No pleural effusion, consolidation, pulmonary edema. Upper Abdomen: Chronic elevated right hemidiaphragm with possible hepatic steatosis. Stable visible upper abdominal viscera from last year with no free air or free fluid identified. Musculoskeletal: No acute osseous abnormality identified. Review of the MIP images confirms the above findings. IMPRESSION: 1. Suboptimal pulmonary artery contrast timing with some respiratory motion. No central or lobar pulmonary artery embolus. 2. Unchanged chronic elevation of the right hemidiaphragm with multilobar right lung atelectasis. 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 No acute pulmonary finding; left posterior costophrenic angle not completely included. Electronically  Signed   By: Genevie Ann M.D.   On: 10/13/2022 05:32   DG Chest 2 View  Result Date: 10/12/2022 CLINICAL DATA:  Shortness of breath. EXAM: CHEST - 2 VIEW COMPARISON:  Chest radiograph dated 10/11/2022. FINDINGS: Elevation of the right hemidiaphragm. No focal consolidation, pleural effusion or pneumothorax. Stable cardiac silhouette. No acute osseous pathology. IMPRESSION: No active cardiopulmonary disease. Electronically Signed   By: Anner Crete M.D.   On: 10/12/2022 23:09   DG Abd 1 View  Result Date: 10/11/2022 CLINICAL DATA:  Concern for radiopaque foreign body as the patient swallowed a coin earlier  today. EXAM: ABDOMEN - 1 VIEW COMPARISON:  Abdominal radiograph dated 08/09/2022. FINDINGS: The bowel gas pattern is normal. A metallic disc overlies the right hemiabdomen, likely representing a coin within the bowel lumen given the clinical history. Degenerative changes are seen in the spine. IMPRESSION: Metallic disc overlies the right hemiabdomen, likely representing a coin within the bowel lumen given the clinical history. Electronically Signed   By: Zerita Boers M.D.   On: 10/11/2022 19:46   DG Chest 1 View  Result Date: 10/11/2022 CLINICAL DATA:  Concern for radiopaque foreign body as the patient swallowed a coin earlier today. EXAM: CHEST  1 VIEW COMPARISON:  Chest radiograph dated 08/09/2022. FINDINGS: The heart size and mediastinal contours are within normal limits. The lung volume is low. There is mild bibasilar atelectasis. No pleural effusion or pneumothorax. The right hemidiaphragm is elevated relative to the left, unchanged. The visualized skeletal structures are unremarkable. IMPRESSION: 1. Low lung volume with mild bibasilar atelectasis. No pleural effusion or pneumothorax. 2. No radiopaque foreign body in the thorax. Electronically Signed   By: Zerita Boers M.D.   On: 10/11/2022 19:45    Procedures Procedures    Medications Ordered in ED Medications  iohexol (OMNIPAQUE) 350  MG/ML injection 80 mL (80 mLs Intravenous Contrast Given 10/13/22 0502)    ED Course/ Medical Decision Making/ A&P Clinical Course as of 10/13/22 0644  Tue Oct 13, 2022  0644 Attempted contact with both legal guardians listed on chart.  No answer. [CH]    Clinical Course User Index [CH] Teresina Bugaj, Barbette Hair, MD                             Medical Decision Making Amount and/or Complexity of Data Reviewed Radiology: ordered.  Risk Prescription drug management.   This patient presents to the ED for concern of chest pain, this involves an extensive number of treatment options, and is a complaint that carries with it a high risk of complications and morbidity.  I considered the following differential and admission for this acute, potentially life threatening condition.  The differential diagnosis includes ACS, PE, pneumonia, pneumothorax, musculoskeletal pain  MDM:    This is a 42 year old female who presents with ongoing chest discomfort.  Was seen and evaluated yesterday after holding for psychiatric evaluation.  At that time she had troponins, EKG, D-dimer that were reassuring.  She presents with the same today.  She is a very poor historian.  Reportedly required oxygen in triage.  I did turn the oxygen off.  She maintained O2 sats 91 to 94%.  She is in no respiratory distress.  She is quite large and suspect she may have some baseline mild hypoxia.  Regardless, repeat imaging was obtained.  Chest x-ray without pneumothorax or pneumonia.  EKG without acute ischemic arrhythmic changes.  Labs including repeat troponins are negative.  Given ongoing complaints, will CT scan even with negative D-dimer as she has a history.  CT scan obtained and does not show any evidence of PE.  Attempted to call her legal guardians who did not answer.  Will plan for discharge.  (Labs, imaging, consults)  Labs: I Ordered, and personally interpreted labs.  The pertinent results include: CBC, BMP, troponin x  2  Imaging Studies ordered: I ordered imaging studies including x-ray, CT I independently visualized and interpreted imaging. I agree with the radiologist interpretation  Additional history obtained from chart review.  External records from outside source  obtained and reviewed including evaluations  Cardiac Monitoring: The patient was maintained on a cardiac monitor.  If on the cardiac monitor, I personally viewed and interpreted the cardiac monitored which showed an underlying rhythm of: Sinus  Reevaluation: After the interventions noted above, I reevaluated the patient and found that they have :improved  Social Determinants of Health:  lives in group home  Disposition: Discharge  Co morbidities that complicate the patient evaluation  Past Medical History:  Diagnosis Date   Adjustment disorder    Borderline personality disorder ([place])    Diabetes mellitus without complication ([place])    Hypertension    Hyperthyroidism    Intellectual disability    Lactic acidosis    Lymphedema    Obesity    Overdose    [place] in adults    Thyroid disease      Medicines Meds ordered this encounter  Medications   iohexol (OMNIPAQUE) 350 MG/ML injection 80 mL    I have reviewed the patients home medicines and have made adjustments as needed  Problem List / ED Course: Problem List Items Addressed This Visit       Other   Atypical chest pain - Primary   Relevant Orders   Ambulatory referral to Cardiology                Final Clinical Impression(s) / ED Diagnoses Final diagnoses:  Atypical chest pain    Rx / DC Orders ED Discharge Orders          Ordered    Ambulatory referral to Cardiology        10/13/22 0639              Merryl Hacker, MD 10/13/22 [phone]

## 2022-10-24 ENCOUNTER — Ambulatory Visit
Admission: RE | Admit: 2022-10-24 | Discharge: 2022-10-24 | Disposition: A | Discharge status: Home / Self Care | Payer: Medicaid Other | Source: Ambulatory Visit | Attending: Family Medicine | Admitting: Family Medicine

## 2022-10-24 VITALS — BP 124/73 | HR 97 | Temp 98.6°F | Resp 18

## 2022-10-24 DIAGNOSIS — R300 Dysuria: Secondary | ICD-10-CM

## 2022-10-24 DIAGNOSIS — S51801A Unspecified open wound of right forearm, initial encounter: Secondary | ICD-10-CM | POA: Diagnosis not present

## 2022-10-24 DIAGNOSIS — B3731 Acute candidiasis of vulva and vagina: Secondary | ICD-10-CM

## 2022-10-24 LAB — POCT URINALYSIS DIP (MANUAL ENTRY)
Bilirubin, UA: NEGATIVE
Blood, UA: NEGATIVE
Glucose, UA: NEGATIVE mg/dL
Ketones, POC UA: NEGATIVE mg/dL
Nitrite, UA: NEGATIVE
Protein Ur, POC: NEGATIVE mg/dL
Spec Grav, UA: 1.025 (ref 1.010–1.025)
Urobilinogen, UA: 0.2 E.U./dL
pH, UA: 5.5 (ref 5.0–8.0)

## 2022-10-24 MED ORDER — MUPIROCIN 2 % EX OINT: 1.0000 | TOPICAL_OINTMENT | Freq: Two times a day (BID) | CUTANEOUS | 0 refills | Status: DC

## 2022-10-24 MED ORDER — BENZOCAINE (TOPICAL) 10 % EX OINT: 1.0000 | TOPICAL_OINTMENT | Freq: Four times a day (QID) | CUTANEOUS | 0 refills | Status: DC | PRN

## 2022-10-24 MED ORDER — FLUCONAZOLE 150 MG PO TABS: ORAL_TABLET | ORAL | 0 refills | Status: DC

## 2022-10-24 NOTE — Discharge Instructions (Addendum)
For the yeast infection in her diaper area give Diflucan tablet today, repeat 1 time in 7 days For the discomfort with urination apply benzocaine as needed prior to using the bathroom and as needed I have prescribed Bactroban skin ointment to be used on small wounds like her right forearm See usual PCP if not improving by next week

## 2022-10-24 NOTE — ED Triage Notes (Signed)
Rash to peri area/ buttocks since yesterday  Here with care giver No topical creams to peri area Burning with urination
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
## 2022-10-26 LAB — URINE CULTURE

## 2022-10-30 ENCOUNTER — Other Ambulatory Visit: Payer: Medicaid Other

## 2023-07-15 ENCOUNTER — Emergency Department (HOSPITAL_COMMUNITY): Payer: MEDICAID

## 2023-07-15 ENCOUNTER — Ambulatory Visit (HOSPITAL_COMMUNITY)
Admission: EM | Admit: 2023-07-15 | Discharge: 2023-07-15 | Disposition: A | Discharge status: Other Acute Inpatient Hospital | Payer: MEDICAID | Attending: Psychiatry | Admitting: Psychiatry

## 2023-07-15 ENCOUNTER — Emergency Department (HOSPITAL_COMMUNITY)
Admission: EM | Admit: 2023-07-15 | Discharge: 2023-07-15 | Disposition: A | Discharge status: Other Acute Inpatient Hospital | Payer: MEDICAID | Attending: Emergency Medicine | Admitting: Emergency Medicine

## 2023-07-15 ENCOUNTER — Ambulatory Visit (HOSPITAL_COMMUNITY)
Admission: EM | Admit: 2023-07-15 | Discharge: 2023-07-15 | Disposition: A | Discharge status: Home / Self Care | Payer: MEDICAID | Attending: Psychiatry | Admitting: Psychiatry

## 2023-07-15 ENCOUNTER — Encounter (HOSPITAL_COMMUNITY): Payer: Self-pay

## 2023-07-15 ENCOUNTER — Other Ambulatory Visit: Payer: Self-pay

## 2023-07-15 DIAGNOSIS — F419 Anxiety disorder, unspecified: Secondary | ICD-10-CM | POA: Diagnosis not present

## 2023-07-15 DIAGNOSIS — Z03821 Encounter for observation for suspected ingested foreign body ruled out: Secondary | ICD-10-CM | POA: Diagnosis not present

## 2023-07-15 DIAGNOSIS — R4589 Other symptoms and signs involving emotional state: Secondary | ICD-10-CM | POA: Insufficient documentation

## 2023-07-15 DIAGNOSIS — R45851 Suicidal ideations: Secondary | ICD-10-CM | POA: Insufficient documentation

## 2023-07-15 DIAGNOSIS — R062 Wheezing: Secondary | ICD-10-CM | POA: Insufficient documentation

## 2023-07-15 DIAGNOSIS — Y900 Blood alcohol level of less than 20 mg/100 ml: Secondary | ICD-10-CM | POA: Insufficient documentation

## 2023-07-15 DIAGNOSIS — F39 Unspecified mood [affective] disorder: Secondary | ICD-10-CM | POA: Insufficient documentation

## 2023-07-15 DIAGNOSIS — Z9152 Personal history of nonsuicidal self-harm: Secondary | ICD-10-CM | POA: Insufficient documentation

## 2023-07-15 DIAGNOSIS — R0602 Shortness of breath: Secondary | ICD-10-CM | POA: Insufficient documentation

## 2023-07-15 DIAGNOSIS — R9431 Abnormal electrocardiogram [ECG] [EKG]: Secondary | ICD-10-CM | POA: Insufficient documentation

## 2023-07-15 DIAGNOSIS — F79 Unspecified intellectual disabilities: Secondary | ICD-10-CM | POA: Insufficient documentation

## 2023-07-15 DIAGNOSIS — F69 Unspecified disorder of adult personality and behavior: Secondary | ICD-10-CM

## 2023-07-15 DIAGNOSIS — R479 Unspecified speech disturbances: Secondary | ICD-10-CM | POA: Insufficient documentation

## 2023-07-15 DIAGNOSIS — R109 Unspecified abdominal pain: Secondary | ICD-10-CM | POA: Insufficient documentation

## 2023-07-15 DIAGNOSIS — R079 Chest pain, unspecified: Secondary | ICD-10-CM | POA: Insufficient documentation

## 2023-07-15 LAB — CBC WITH DIFFERENTIAL/PLATELET
Abs Immature Granulocytes: 0.04 10*3/uL (ref 0.00–0.07)
Basophils Absolute: 0.1 10*3/uL (ref 0.0–0.1)
Basophils Relative: 1 %
Eosinophils Absolute: 0.3 10*3/uL (ref 0.0–0.5)
Eosinophils Relative: 4 %
HCT: 39.1 % (ref 36.0–46.0)
Hemoglobin: 11.8 g/dL — ABNORMAL LOW (ref 12.0–15.0)
Immature Granulocytes: 1 %
Lymphocytes Relative: 38 %
Lymphs Abs: 2.7 10*3/uL (ref 0.7–4.0)
MCH: 27.3 pg (ref 26.0–34.0)
MCHC: 30.2 g/dL (ref 30.0–36.0)
MCV: 90.3 fL (ref 80.0–100.0)
Monocytes Absolute: 0.4 10*3/uL (ref 0.1–1.0)
Monocytes Relative: 6 %
Neutro Abs: 3.6 10*3/uL (ref 1.7–7.7)
Neutrophils Relative %: 50 %
Platelets: 296 10*3/uL (ref 150–400)
RBC: 4.33 MIL/uL (ref 3.87–5.11)
RDW: 13.1 % (ref 11.5–15.5)
WBC: 7 10*3/uL (ref 4.0–10.5)
nRBC: 0 % (ref 0.0–0.2)

## 2023-07-15 LAB — COMPREHENSIVE METABOLIC PANEL
ALT: 14 U/L (ref 0–44)
AST: 17 U/L (ref 15–41)
Albumin: 4.3 g/dL (ref 3.5–5.0)
Alkaline Phosphatase: 60 U/L (ref 38–126)
Anion gap: 12 (ref 5–15)
BUN: 19 mg/dL (ref 6–20)
CO2: 26 mmol/L (ref 22–32)
Calcium: 9 mg/dL (ref 8.9–10.3)
Chloride: 107 mmol/L (ref 98–111)
Creatinine, Ser: 0.71 mg/dL (ref 0.44–1.00)
GFR, Estimated: >60 mL/min (ref >60)
Glucose, Bld: 89 mg/dL (ref 70–99)
Potassium: 3.7 mmol/L (ref 3.5–5.1)
Sodium: 145 mmol/L (ref 135–145)
Total Bilirubin: 0.5 mg/dL (ref <1.2)
Total Protein: 7.4 g/dL (ref 6.5–8.1)

## 2023-07-15 LAB — HCG, SERUM, QUALITATIVE: Preg, Serum: NEGATIVE

## 2023-07-15 LAB — ETHANOL: Alcohol, Ethyl (B): <10 mg/dL (ref <10)

## 2023-07-15 MED ORDER — ALBUTEROL SULFATE HFA 108 (90 BASE) MCG/ACT IN AERS: 2.0000 | INHALATION_SPRAY | Freq: Once | RESPIRATORY_TRACT | Status: DC

## 2023-07-15 NOTE — ED Provider Triage Note (Signed)
Emergency Medicine Provider Triage Evaluation Note  Megan Manning , a 42 y.o. female  was evaluated in triage.  Pt complains of swallowed a bottle cap.  Occurred about an hour ago.  States she was sad which prompted her to swallow the bottle.  States she is having trouble breathing.  Denies SI HI and hallucinations.  Denies chest pain.  Review of Systems  Positive: See above Negative: Se above  Physical Exam  BP (!) 179/82 (BP Location: Right Arm)   Pulse 79   Temp 98.6 F (37 C) (Oral)   Resp 18   Ht 5\' 3"  (1.6 m)   Wt (!) 154.2 kg   SpO2 94%   BMI 60.23 kg/m  Gen:   Awake, no distress   Resp:  Normal effort and normal air entry does not appear to be any respiratory distress at this time MSK:   Moves extremities without difficulty  Other:    Medical Decision Making  Medically screening exam initiated at 11:33 AM.  Appropriate orders placed.  KINZY LEIDECKER was informed that the remainder of the evaluation will be completed by another provider, this initial triage assessment does not replace that evaluation, and the importance of remaining in the ED until their evaluation is complete.  Work up started   Gareth Eagle, PA-C 07/15/23 1134

## 2023-07-15 NOTE — Discharge Instructions (Signed)
F/u with outpatient provider

## 2023-07-15 NOTE — ED Notes (Signed)
Broke the glass to determine POC after pt is cleared from ED evaluation.

## 2023-07-15 NOTE — Discharge Instructions (Signed)
You were seen in the Emergency Department after swallowing a bottle The x-ray did not show a bottle And you are able to eat and drink She should come back to the emergency department if there is difficulty swallowing or trouble breathing Otherwise she can follow-up with her providers as scheduled for next week

## 2023-07-15 NOTE — ED Provider Notes (Signed)
Behavioral [facility] Urgent Care Medical Screening Exam  Patient Name: Megan Manning MRN: [id number] Date of Evaluation: 07/15/23 Chief Complaint:  pt had swallowed a bottle cap at her AFL Diagnosis:  Final diagnoses:  Episodic mood disorder (HCC)  Behavior concern in adult  Intellectual disability    History of Present illness: Megan Manning is a 42 y.o. female. With a history of IDD, self-injurious behavior, adjustment disorder.  Patient was seen earlier today and sent to the ED for medical clearance due to the fact that patient had swallowed a bottle Out of anger.  Patient returned from the ED and was medically cleared.  Patient was evaluated by writer, patient is alert and oriented x 4, speech is clear however patient does have some impediment in speech.  But is easily understood.  Patient denies wanting to hurt herself patient was able to tell what had happened that she was sick over the weekend and on Monday she told the staff at the ALF that she was having problems and they keep ignoring her.  Patient stated she got upset and swallowed the bottle.  Patient denies wanting to kill herself, denies HI, denies AVH or paranoia.  Patient denies alcohol or illicit drug use.  TTS did reach out the ALF, spoke with. Lauren Renita Papa [phone], she is on her way to pick up Thurmond Butts.   From a clinical standpoint patient is psych cleared and can be returned to the facility.  Patient is advised that if she feels like she wants to hurt anyone she need to talk to ALF provider before trying to do anything to herself.  At this present moment patient does not appear to be influenced by internal or external stimuli.  When Clinical research associate spoke with patient patient was eager to go back home.  The patient stated she does not want to hurt herself.  Recommend discharge for ALF to follow up with patient outside provider.  Flowsheet Row ED from 07/15/2023 in [facility] Emergency Department at [facility] Most recent  reading at 07/15/2023 11:18 AM ED from 07/15/2023 in [facility] Most recent reading at 07/15/2023  9:27 AM ED from 10/24/2022 in [facility] at [place] Most recent reading at 10/24/2022 12:16 PM  C-SSRS RISK CATEGORY No Risk No Risk Error: Q3, 4, or 5 should not be populated when Q2 is No       Psychiatric Specialty Exam  Presentation  General Appearance:Appropriate for Environment  Eye Contact:Fair  Speech:Clear and Coherent  Speech Volume:Normal  Handedness:Right   Mood and Affect  Mood: Anxious; Labile  Affect: Congruent   Thought Process  Thought Processes: Linear  Descriptions of Associations:Intact  Orientation:Full (Time, Place and Person)  Thought Content:WDL  Diagnosis of Schizophrenia or Schizoaffective disorder in past: No data recorded  Hallucinations:None  Ideas of Reference:None  Suicidal Thoughts:No  Homicidal Thoughts:No   Sensorium  Memory: Immediate Fair  Judgment: Intact  Insight: Shallow   Executive Functions  Concentration: Fair  Attention Span: Fair  Recall: Fair  Fund of Knowledge: Fair  Language: Fair   Psychomotor Activity  Psychomotor Activity: Normal   Assets  Assets: Desire for Improvement; Resilience; Social Support   Sleep  Sleep: Fair  Number of hours:  6   Physical Exam: Physical Exam HENT:     Head: Normocephalic.     Nose: Nose normal.  Eyes:     Pupils: Pupils are equal, round, and reactive to light.  Cardiovascular:  Rate and Rhythm: Normal rate.  Pulmonary:     Effort: Pulmonary effort is normal.  Musculoskeletal:        General: Normal range of motion.     Cervical back: Normal range of motion.  Neurological:     General: No focal deficit present.     Mental Status: She is alert.  Psychiatric:        Mood and Affect: Mood normal.        Behavior: Behavior normal.        Thought Content: Thought content normal.    Review of Systems  Constitutional: Negative.   HENT: Negative.    Eyes: Negative.   Respiratory: Negative.    Cardiovascular: Negative.   Gastrointestinal: Negative.   Genitourinary: Negative.   Musculoskeletal: Negative.   Skin: Negative.   Neurological: Negative.   Psychiatric/Behavioral:  The patient is nervous/anxious.    Blood pressure (!) 172/69, pulse 99, temperature 98.6 F (37 C), temperature source Oral, resp. rate 17, SpO2 95%. There is no height or weight on file to calculate BMI.  Musculoskeletal: Strength & Muscle Tone: within normal limits Gait & Station: normal Patient leans: N/A   [facility] MSE Discharge Disposition for Follow up and Recommendations: Based on my evaluation the patient does not appear to have an emergency medical condition and can be discharged with resources and follow up care in outpatient services for Medication Management   Sindy Guadeloupe, NP 07/15/2023, 8:37 PM

## 2023-07-15 NOTE — ED Notes (Signed)
Called to give report to [facility]. Advised that we are sending pt there for chest pain. Was advised that they are in the middle of an emergency and pt may have to wait in the lobby.

## 2023-07-15 NOTE — ED Notes (Signed)
EMS called. Advised to come to the back sally port

## 2023-07-15 NOTE — ED Triage Notes (Addendum)
Patient BIB [facility] from [facility]. Swallowed bottle cap 1 hour ago. Feels like it is stuck in her chest. Able to maintain airway in triage. Able to swallow still. Was sad so she swallowed a bottle cap. Voluntary. No SI/HI.

## 2023-07-15 NOTE — ED Notes (Addendum)
Error

## 2023-07-15 NOTE — ED Provider Notes (Signed)
[place] EMERGENCY DEPARTMENT AT [facility] Provider Note   CSN: [id number] Arrival date & time: 07/15/23  1107     History  Chief Complaint  Patient presents with   Swallowed Bottle Cap    Megan Manning is a 42 y.o. female.Patient is a 42 year old female with a history of intellectual disability, foreign body ingestion and self-injurious behavior who presents given concern for swallowed foreign body.  She has been residing at ALF since last month.  She reportedly became angry today and swallowed a plastic water bottle, small pieces of cardboard and clothing tag.  She was initially seen at behavioral health urgent care and sent here for further evaluation given concern for potential foreign body ingestion and wheezing.  Here in the ED patient states she was not trying to harm herself.  Denies HI SI.  No attempted overdose or other self-harm.  HPI     Home Medications Prior to Admission medications   Medication Sig Start Date End Date Taking? Authorizing Provider  acetaminophen (TYLENOL) 500 MG tablet Take 1,000 mg by mouth every 6 (six) hours as needed for moderate pain. Take 1,000 mg by mouth every 6-8 hours as needed for discomfort Patient not taking: Reported on 10/24/2022    [provider]  acetaminophen (TYLENOL) 650 MG CR tablet Take 650 mg by mouth every 8 (eight) hours as needed for pain.    [provider]  albuterol (PROVENTIL) (2.5 MG/3ML) 0.083% nebulizer solution Take 3 mLs (2.5 mg total) by nebulization every 6 (six) hours as needed for wheezing or shortness of breath. 08/07/22   Roxy Horseman, PA-C  albuterol (VENTOLIN HFA) 108 (90 Base) MCG/ACT inhaler Inhale 2 puffs into the lungs every 6 (six) hours as needed for wheezing (or coughing). 07/13/19   [provider]  ammonium lactate (LAC-HYDRIN) 12 % lotion Apply 1 Application topically See admin instructions. Apply to the hands and feet 2 times a day    [provider]  asenapine (SAPHRIS) 5 MG SUBL 24 hr tablet Place 5 mg under the tongue 2 (two) times daily as needed (for agitation).    [provider]  ASTEPRO 205.5 MCG/SPRAY SOLN Place 1 spray into both nostrils 2 (two) times daily.    [provider]  atorvastatin (LIPITOR) 20 MG tablet Take 20 mg by mouth at bedtime. 03/09/21   [provider]  BENZOCAINE, TOPICAL, 10 % OINT Apply 1 Application topically 4 (four) times daily as needed (vaginal area pain). 10/24/22   Eustace Moore, MD  diclofenac Sodium (VOLTAREN) 1 % GEL Apply topically as directed.    [provider]  divalproex (DEPAKOTE ER) 500 MG 24 hr tablet Take 500 mg by mouth 2 (two) times daily.    [provider]  fluconazole (DIFLUCAN) 150 MG tablet Give 1 tablet today.  Repeat 1 week 10/24/22   Eustace Moore, MD  furosemide (LASIX) 40 MG tablet Take 0.5 tablets (20 mg total) by mouth daily. 06/29/22   Elgergawy, Leana Roe, MD  gabapentin (NEURONTIN) 300 MG capsule Take 300-600 mg by mouth See admin instructions. Take 300 mg by mouth in the morning and 600 mg at bedtime    [provider]  INVEGA SUSTENNA 234 MG/1.5ML injection Inject 234 mg into the muscle every 30 (thirty) days.    [provider]  levothyroxine (SYNTHROID) 88 MCG tablet Take 1 tablet (88 mcg total) by mouth daily. Patient taking differently: Take 88 mcg by mouth daily before  breakfast. 11/17/18   Lamptey, Britta Mccreedy, MD  loratadine (CLARITIN) 10 MG tablet Take 1 tablet (10 mg total) by mouth daily. 03/16/17   Clapacs, Jackquline Denmark, MD  LORazepam (ATIVAN) 1 MG tablet Take 1 tablet (1 mg total) by mouth every 8 (eight) hours as needed for anxiety. Patient taking differently: Take 1 mg by mouth 3 (three) times daily. 06/29/22   Elgergawy, Leana Roe, MD  medroxyPROGESTERone (DEPO-PROVERA) 150 MG/ML injection Inject 150 mg into the muscle every 3 (three) months. 05/04/22   [provider]  metoprolol succinate  (TOPROL-XL) 100 MG 24 hr tablet Take 1 tablet (100 mg total) by mouth daily. Take with or immediately following a meal. 06/30/22   Elgergawy, Leana Roe, MD  montelukast (SINGULAIR) 10 MG tablet Take 10 mg by mouth at bedtime.    [provider]  mupirocin ointment (BACTROBAN) 2 % Apply 1 Application topically 2 (two) times daily. For skin wounds 10/24/22   Eustace Moore, MD  nystatin (MYCOSTATIN/NYSTOP) powder Apply 1 Application topically 2 (two) times daily.    [provider]  Olopatadine HCl 0.2 % SOLN Place 1 drop into both eyes in the morning and at bedtime.    [provider]  omeprazole (PRILOSEC) 20 MG capsule Take 1 capsule (20 mg total) by mouth daily. Patient taking differently: Take 20 mg by mouth in the morning. 03/16/17   Clapacs, Jackquline Denmark, MD  polyethylene glycol (MIRALAX / GLYCOLAX) 17 g packet Take 17 g by mouth daily as needed. Patient taking differently: Take 17 g by mouth daily as needed for mild constipation. 06/29/22   Elgergawy, Leana Roe, MD  potassium chloride SA (KLOR-CON M) 20 MEQ tablet Take 0.5 tablets (10 mEq total) by mouth daily. Patient taking differently: Take 20 mEq by mouth daily. 06/29/22   Elgergawy, Leana Roe, MD  prazosin (MINIPRESS) 5 MG capsule Take 5 mg by mouth at bedtime.    [provider]  senna-docusate (SENOKOT-S) 8.6-50 MG tablet Take 1 tablet by mouth at bedtime. 06/29/22   Elgergawy, Leana Roe, MD  sertraline (ZOLOFT) 50 MG tablet Take 50 mg by mouth every evening.    [provider]  Vitamin D, Ergocalciferol, (DRISDOL) 1.25 MG (50000 UNIT) CAPS capsule Take 50,000 Units by mouth every Thursday. 05/27/22   [provider]      Allergies    Chlorpromazine    Review of Systems   Review of Systems  Physical Exam Updated Vital Signs BP (!) 154/81   Pulse 73   Temp 98 F (36.7 C)   Resp 18   Ht 5\' 3"  (1.6 m)   Wt (!) 154.2 kg   SpO2 96%   BMI 60.23 kg/m  Physical Exam Vitals and nursing  note reviewed.  HENT:     Head: Normocephalic and atraumatic.     Mouth/Throat:     Pharynx: Oropharynx is clear.  Eyes:     Pupils: Pupils are equal, round, and reactive to light.  Cardiovascular:     Rate and Rhythm: Normal rate and regular rhythm.  Pulmonary:     Effort: Pulmonary effort is normal.     Breath sounds: Normal breath sounds.     Comments: No wheezing, diminished breath sounds throughout right lung fields Patent airway without stridor Abdominal:     Palpations: Abdomen is soft.     Tenderness: There is no abdominal tenderness.  Skin:    General: Skin is warm and dry.  Neurological:  Mental Status: She is alert.  Psychiatric:        Mood and Affect: Mood normal.     ED Results / Procedures / Treatments   Labs (all labs ordered are listed, but only abnormal results are displayed) Labs Reviewed  CBC WITH DIFFERENTIAL/PLATELET - Abnormal; Notable for the following components:      Result Value   Hemoglobin 11.8 (*)    All other components within normal limits  COMPREHENSIVE METABOLIC PANEL  ETHANOL  HCG, SERUM, QUALITATIVE  RAPID URINE DRUG SCREEN, HOSP PERFORMED    EKG None  Radiology No results found.  Procedures Procedures    Medications Ordered in ED Medications - No data to display  ED Course/ Medical Decision Making/ A&P Clinical Course as of 07/15/23 1554  Thu Jul 15, 2023  1540 Reassessed patient.  She remained stable from an airway standpoint without wheezing stridor or hypoxemia.  She was able to swallow a couple sips of water but has not yet had anything to eat.  Still feels as though something is stuck in her throat.    Jackquline Bosch DO, am transitioning care of this patient to the oncoming provider pending formal chest x-ray radiology read, p.o. intake, reevaluation and disposition [MP]  1542 Received sign out from Dr. Elayne Snare pending CXR. Patient reportedly swallowed some type of plastic bottle cap per her report. Coming for  [facility]. Low concern for any airway involvement. Tolerating secretions.  [WS]    Clinical Course User Index [MP] Royanne Foots, DO [WS] Lonell Grandchild, MD                                 Medical Decision Making 42 year old female with history of intellectual disability and foreign body ingestion presenting for suspected foreign body ingestion today.  Admitted to swallowing a plastic water bottle Along with cardboard and a clothing tag.  Denies SI HI.  No other attempted self-harm.  Reportedly was wheezing earlier.  Stable on room air here but some pain with swallowing.  Tolerating secretions and able to speak in full sentences.  Will evaluate for esophageal foreign body with x-ray and continue to monitor here in ED           Final Clinical Impression(s) / ED Diagnoses Final diagnoses:  Suspected ingested foreign body not found after observation    Rx / DC Orders ED Discharge Orders     None         Royanne Foots, DO 07/15/23 1554

## 2023-07-15 NOTE — ED Provider Notes (Signed)
   ED Course / MDM   Clinical Course as of 07/15/23 1715  Thu Jul 15, 2023  1540 Reassessed patient.  She remained stable from an airway standpoint without wheezing stridor or hypoxemia.  She was able to swallow a couple sips of water but has not yet had anything to eat.  Still feels as though something is stuck in her throat.    Jackquline Bosch DO, am transitioning care of this patient to the oncoming provider pending formal chest x-ray radiology read, p.o. intake, reevaluation and disposition [MP]  1542 Received sign out from Dr. Elayne Snare pending CXR. Patient reportedly swallowed some type of plastic bottle cap per her report. Coming for [facility]. Low concern for any airway involvement. Tolerating secretions.  [WS]  1714 Patient able to tolerate p.o. without difficulty.  Chest x-ray is negative.  Low concern for any esophageal foreign body or tracheal foreign body.  If patient did have foreign body ingestion should pass on its own.  No indication for hospital admission.  She can go back to the behavioral health urgent care because she does report some ongoing depression symptoms and she was sent here for medical clearance.  Her EKG showed questionable atrial flutter and was repeated and on recheck I suspect this changes more likely artifactual.  In any case, this would not change management as her CHA2DS2-VASc score is only 1.  Reviewed previous EKGs which do show similar pattern.  Patient medically cleared for further psychiatric evaluation and treatment. [WS]    Clinical Course User Index [MP] Royanne Foots, DO [WS] Lonell Grandchild, MD   Medical Decision Making         Lonell Grandchild, MD 07/15/23 [phone]
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
## 2023-07-15 NOTE — ED Notes (Signed)
Patient discharged to [facility] due to chest pains. Patient transported by EMS

## 2023-07-15 NOTE — ED Provider Notes (Addendum)
Behavioral Health Urgent Care Medical Screening Exam  Patient Name: Megan Manning MRN: [id number] Date of Evaluation: 07/15/23 Chief Complaint:   Diagnosis:  Final diagnoses:  None    History of Present illness: Megan Manning is a 42 y.o. female with a past psychiatric history of IDD (IQ score unknown), self-injurious behaviors adjustment disorder, and BPD escorted to the [facility] via [facility] after swallowing several objects and threatening to kill herself at her ALF [facility], Megan [facility] [phone] ).  Past medical history is significant for asthma, lymphedema, hypothyroidism, diabetes, and dysphagia. The patient has a LG [facility] [employer] [phone] ext [phone]).  The patient was evaluated on the unit.  She admits to swallowing a bottle cap out of anger.  She denies any suicidal ideations or homicidal ideations.  She is a poor historian, is unsure of her psychiatric diagnoses.  She has been staying at her ALF since November, prior to that she had a 73-month hospitalization at [facility] health.  This provider went to speak with ALF representatives to obtain further collateral information and inform them that patient would be transferred to the emergency department for medical clearance before any formal behavioral health recommendations could be made.  ALF provider confirms the patient swallowed a water bottle cap, as well as pieces of cardboard, and a clothing tag.  During that time I was informed that patient was having worsening chest pain, abdominal pain, and difficulty breathing.  Wheezing was noted on exam and patient was given albuterol inhaler.  EMS services were promptly called and Megan Manning, ED provider, Dr. Elayne Snare was given signout and agreed to accept patient.  EKG while waiting for EMS showed a HR of 134. Vitals: BP 152/87 Pulse 93 O2 94% RA, improving to 99% after 3 puffs of albuterol inhaler were given.  Oxygen was on  standby.    Past Psychiatric Hx: Psychotropic medications prescribed by [facility] medical Current psychotropic meds include:  sertraline 10 mg daily, fluphenazine 7.5 mg every day ( 3 2.5 mg tablets), seroquel 200 mg nightly. 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 Reports her family resides is [place]. Social Support: Surveyor, minerals, sister, and father Education: patient states "all of it" Occupational hx: Marital Status: Single Children:Denies Legal:Denies Military:Denies   Access to firearms: Denies     Flowsheet Row ED from 07/15/2023 in [facility] ED from 10/24/2022 in [facility] at [facility] ED from 10/12/2022 in [facility] Emergency Department at [facility]  C-SSRS RISK CATEGORY No Risk Error: Q3, 4, or 5 should not be populated when Q2 is No No Risk       Psychiatric Specialty Exam  Presentation  General Appearance:Appropriate for Environment  Eye Contact:Fair  Speech:Clear and Coherent  Speech Volume:Normal  Handedness:not assessed   Mood and Affect  Mood: "upset"  Affect: Flat   Thought Process  Thought Processes: Linear  Descriptions of Associations:Intact  Orientation:Full (Time, Place and Person)  Thought Content:Rumination; Logical  Diagnosis of Schizophrenia or Schizoaffective disorder in past: No data recorded  Hallucinations:None  Ideas of Reference:None  Suicidal Thoughts:No  Homicidal Thoughts:No   Sensorium  Memory: Immediate Fair  Judgment: Poor  Insight: Limited   Executive Functions  Concentration: Fair  Attention  Span: Fair  Recall: [employer] of Knowledge: Fair  Language: Fair   Psychomotor Activity  Psychomotor Activity: Normal   Assets  Assets: Manufacturing systems engineer; Financial Resources/Insurance; Housing; Leisure Time; Physical Health; Social Support   Sleep  Sleep: Fair  Number of hours:  8   Physical Exam: Physical Exam Constitutional:      General: She is in acute distress.     Appearance: She is morbidly obese.  HENT:     Head: Normocephalic and atraumatic.  Eyes:     Conjunctiva/sclera: Conjunctivae normal.  Cardiovascular:     Rate and Rhythm: Tachycardia present.  Pulmonary:     Effort: Respiratory distress present.     Breath sounds: Wheezing present.     Comments: Diminished breath sounds at the bases bilaterally Musculoskeletal:        General: Normal range of motion.  Skin:    General: Skin is warm and dry.    Review of Systems  Respiratory:  Positive for shortness of breath and wheezing.   Cardiovascular:  Positive for chest pain.  Gastrointestinal:  Positive for abdominal pain.  Neurological:  Positive for dizziness.   Blood pressure (!) 155/87, pulse 94, temperature 99.4 F (37.4 C), resp. rate (!) 22, SpO2 97%. There is no height or weight on file to calculate BMI.  Musculoskeletal: Strength & Muscle Tone: within normal limits Gait & Station: normal Patient leans: N/A   [facility] MSE Discharge Disposition for Follow up and Recommendations: Based on my evaluation the patient appears to have an emergency medical condition for which I recommend the patient be transferred to the emergency department for further evaluation.    Once medically cleared, patient can return to [facility], MD 07/15/2023, 11:15 AM

## 2023-07-16 ENCOUNTER — Emergency Department (HOSPITAL_COMMUNITY): Payer: MEDICAID

## 2023-07-16 ENCOUNTER — Ambulatory Visit (HOSPITAL_COMMUNITY)
Admission: EM | Admit: 2023-07-16 | Discharge: 2023-07-16 | Disposition: A | Discharge status: Other Acute Inpatient Hospital | Payer: MEDICAID

## 2023-07-16 ENCOUNTER — Encounter (HOSPITAL_COMMUNITY): Payer: Self-pay | Admitting: Emergency Medicine

## 2023-07-16 ENCOUNTER — Other Ambulatory Visit: Payer: Self-pay

## 2023-07-16 ENCOUNTER — Emergency Department (HOSPITAL_COMMUNITY)
Admission: EM | Admit: 2023-07-16 | Discharge: 2023-07-18 | Disposition: A | Discharge status: Home / Self Care | Payer: MEDICAID | Attending: Emergency Medicine | Admitting: Emergency Medicine

## 2023-07-16 DIAGNOSIS — F5083 Pica in adults: Secondary | ICD-10-CM | POA: Diagnosis present

## 2023-07-16 DIAGNOSIS — R0600 Dyspnea, unspecified: Secondary | ICD-10-CM | POA: Diagnosis not present

## 2023-07-16 DIAGNOSIS — F419 Anxiety disorder, unspecified: Secondary | ICD-10-CM

## 2023-07-16 DIAGNOSIS — F71 Moderate intellectual disabilities: Secondary | ICD-10-CM | POA: Diagnosis not present

## 2023-07-16 DIAGNOSIS — Z79899 Other long term (current) drug therapy: Secondary | ICD-10-CM | POA: Diagnosis not present

## 2023-07-16 DIAGNOSIS — E119 Type 2 diabetes mellitus without complications: Secondary | ICD-10-CM | POA: Diagnosis not present

## 2023-07-16 DIAGNOSIS — Z1152 Encounter for screening for COVID-19: Secondary | ICD-10-CM | POA: Insufficient documentation

## 2023-07-16 DIAGNOSIS — R1013 Epigastric pain: Secondary | ICD-10-CM | POA: Insufficient documentation

## 2023-07-16 DIAGNOSIS — T189XXA Foreign body of alimentary tract, part unspecified, initial encounter: Secondary | ICD-10-CM

## 2023-07-16 DIAGNOSIS — F430 Acute stress reaction: Secondary | ICD-10-CM

## 2023-07-16 DIAGNOSIS — J45909 Unspecified asthma, uncomplicated: Secondary | ICD-10-CM | POA: Diagnosis not present

## 2023-07-16 DIAGNOSIS — I10 Essential (primary) hypertension: Secondary | ICD-10-CM | POA: Insufficient documentation

## 2023-07-16 MED ORDER — IPRATROPIUM-ALBUTEROL 0.5-2.5 (3) MG/3ML IN SOLN
3.0000 mL | Freq: Once | RESPIRATORY_TRACT | Status: AC
  Administered 2023-07-16: 3 mL via RESPIRATORY_TRACT
  Filled 2023-07-16: qty 3

## 2023-07-16 NOTE — ED Notes (Signed)
Pt kept requesting food. Pt also kept trying to make herself vomit.  Pt was not given food.

## 2023-07-16 NOTE — BH Assessment (Signed)
Clinician spoke to Faith with [facility] to complete TTS assessment. Clinician provided pt's name, MRN, location, age, provider name and room number. Secure message completed.    [facility] coordinator to message chat of assessment time.   Redmond Pulling, MS, [facility], [facility] Triage Specialist [phone]

## 2023-07-16 NOTE — Consult Note (Signed)
[facility] Consult Note  Patient Name: Megan Manning MRN: [id number] DOB: June 02, 1981 DATE OF Consult: 07/16/2023   TELEPSYCHIATRY ATTESTATION & CONSENT  As the provider for this telehealth consult, I attest that I verified the patient's identity using two separate identifiers, introduced myself to the patient, provided my credentials, disclosed my location, and performed this encounter via a HIPAA-compliant, real-time, face-to-face, two-way, interactive audio and video platform and with the full consent and agreement of the patient (or guardian as applicable.)  Patient physical location:  Department: [facility] Department at [facility]  Bed: H018C  Acuity:  Urgent  [place] . 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 Claims " I stop breathing " Compliance ?  INVEGA Sustenna inject due ?? IDD   RECOMMENDATIONS  Medication recommendations: none  Please reconcile ---if she needs that INVEGA sustenna inject ----unclear if ALF gave such or NOT ?.  Non-Medication/therapeutic recommendations: rule out sleep apnea at  night. Please have facility psychiatrist see patient ASAP Need better supervision at ALF to restrict PICA behavior   Is inpatient psychiatric hospitalization recommended for this patient?:                   [x]  NO  : patient's current clinical presentation, past hx and risk potential does not obligate nor warrant INPT hospitalization.she refuses ----she does not meet involuntary hold criteria in my eyes.  I can force such at this time denies SI or HI      From a psychiatric perspective, is this patient appropriate for discharge to an outpatient setting/resource or other less restrictive environment  for continued care?:          [x]  YES : please see facility psychiatrist ASAP.           Follow-Up Telepsychiatry C/L services:                 [x]  We will sign off for now. Please re-consult our service if needed for                       any concerning changes in the patient's condition, discharge planning, or                     questions.            Communication: Treatment team members (and family members if applicable) who were involved in treatment/care discussions and planning, and with whom we spoke or engaged with via secure text/chat, include the following: ed physician/butler  Thank you for involving [name] in the care of this patient.      CHIEF COMPLAINT/REASON FOR CONSULT  PICA  HISTORY OF PRESENT ILLNESS (HPI)   This is a 42 year old female who has a history of borderline personality disorder ,intellectual disability, history of overdose ,pica Adjustment disorder, history of self-injury  and agitation    patient was brought in by police from her assisted living facility. apparently she swallowed a water bottle top.//she reportedly became angry   and swallowed a plastic water bottle, small pieces of cardboard and clothing tag   she usually receives a long-acting InvEGA Sustenna [place] injection and supposedly has not received such in November. here in the emergency room  she denied  SI or homicidal ideation. no auditory visual hallucinations .  She was seen by psychiatry  Sindy Messing MD on 12/19 asked for medical clearance    Has been medically cleared thus far in the Ed.  Was  seen Again by Psychiatry on 12/19  York Pellant NP recommend dc back to ALF   After DC from ED she returns back to ED swallowing a bracelet -swallowing foreign objects (hospital band, [employer] Fry box, and bandaids  Once again -- no si no Hi reported to ed staff ====================================  On interview patient appear to be anxious currently receiving a breathing treatment patient having  difficulty breathing having problems catching her breath she claims. she claims that this is worse at night and claims she stops breathing at night. consideration should be given for sleep apnea patient has a BMI of 60.23 she weighs 154 kilos.   patient admits that she's been swallowing objects but she does so in order to feel better she claims she's feeling anxious angry upset nervous. she claims that she needs her medications last time she had to received her injection was perhaps in November. she is due for injection for the month of December inspecting her charge she receives  invega  sustenna 234 mg once a month.   patient otherwise denies wanting to swallow objects during my visit. she appears to be more concerned about her shortness of breath. patient claim she has no wanting to be dead. no Suicidal Thoughts. she is no thoughts of wanting to hurt other people. she's been to the psychiatric hospital these three to four times she claims. patient refuses to voluntary  admission at this point in time,  She shows no evidence of any Mania, no grandiosity, no delusions, no severe agitation, no aggression, no looseness of Association. she's non-tangential, linear straight to the point simplistic in her thinking. her intellectual disability  is observable.   she refuses inpatient psychiatry.   she does not meet inpatient psychiatric hold criteria. we cannot force her for such treatment. consideration should be given to discharge back to assisted living facility. consideration she'll also be given to see if she should receive her monthly injection of her invega sustenna.   please have Pharmacy reconcile if she has received such or not.     PAST PSYCHIATRIC HISTORY     Otherwise as per HPI above.  PAST MEDICAL HISTORY  Past Medical History:  Diagnosis Date   Adjustment disorder    Borderline personality disorder (HCC)    Diabetes mellitus without complication (HCC)    Hypertension    Hyperthyroidism     Intellectual disability    Lactic acidosis    Lymphedema    Obesity    Overdose    Pica in adults    Thyroid disease       HOME MEDICATIONS  (Not in a hospital admission)       ALLERGIES  Allergies  Allergen Reactions   Chlorpromazine Rash and Other (See Comments)    Caused cornea problems    SOCIAL & SUBSTANCE USE HISTORY  Social History   Socioeconomic History   Marital status: Single    Spouse name: Not on file   Number of children: Not on file   Years of education: Not on file   Highest education level: Not on file  Occupational History   Not on file  Tobacco Use   Smoking status: Never   Smokeless tobacco: Never  Vaping Use   Vaping status:  Never Used  Substance and Sexual Activity   Alcohol use: No   Drug use: No   Sexual activity: Not on file  Other Topics Concern   Not on file  Social History Narrative   Not on file   Social Drivers of Health   Financial Resource Strain: Low Risk  (09/02/2022)   Received from [facility]   Overall Financial Resource Strain (CARDIA)    Difficulty of Paying Living Expenses: Not hard at all  Food Insecurity: No Food Insecurity (04/05/2023)   Received from [facility]   Hunger Vital Sign    Worried About Running Out of Food in the Last Year: Never true    Ran Out of Food in the Last Year: Never true  Transportation Needs: No Transportation Needs (04/05/2023)   Received from [facility] - Transportation    Lack of Transportation (Medical): No    Lack of Transportation (Non-Medical): No  Physical Activity: Not on file  Stress: No Stress Concern Present (04/15/2023)   Received from [facility] - Occupational Stress Questionnaire    Feeling of Stress : Only a little  Recent Concern: Stress - Stress Concern Present (04/05/2023)   Received from [facility] - Occupational Stress Questionnaire    Feeling of  Stress : To some extent  Social Connections: Unknown (12/09/2021)   Received from [facility]   Social Network    Social Network: Not on file      FAMILY HISTORY  Family History  Problem Relation Age of Onset   Healthy Mother    Healthy Father    CVA Neg Hx    Family Psychiatric History (if known):          MENTAL STATUS EXAM (MSE)  Mental Status Exam: General Appearance: Casual  Orientation:  Full (Time, Place, and Person)  Memory:   intact  Concentration:  Concentration: Fair  Recall:  Fair  Attention  Fair  Eye Contact:  Good  Speech:  Normal Rate  Language:  Fair  Volume:  Normal  Mood: anxious  Affect:  Appropriate  Thought Process:  Goal Directed  Thought Content:  Negative  Suicidal Thoughts:  No  Homicidal Thoughts:  No  Judgement:  Poor  Insight:  Fair  Psychomotor Activity:  Negative  Akathisia:  No  Fund of Knowledge:  Poor    Assets:  Resilience  Cognition:  WNL  ADL's:  Intact  AIMS (if indicated):          VITALS (IF TAKEN)   @VSR @   BP (!) 149/82 (BP Location: Right Wrist)   Pulse 80   Temp 98.6 F (37 C) (Oral)   Resp 20   SpO2 95%    LABS that are pertinent     ROS & ADDITIONAL FINDINGS  ROS: Notable for the following relevant positive findings: Psychiatric: per hpi  Other notable positive ROS findings: per hpis  Additional findings:      Musculoskeletal:    [x]  No Abnormal Movements Observed        []  Impaired      Gait & Station:        [x]  Normal        []  Wheelchair/Walker          []  Laying/Sitting       Pain Screening:   [x]  Denies    []  Present--mild to moderate     []   Present--severe (will                             consider referral for ongoing evaluation and treatment)      Nutrition & Dental Concerns:no gross dental or  eating disorder   RISK ASSESSMENT*  Is the patient experiencing any suicidal or homicidal ideations:     [] YES        [x]  NO   Protective factors considered for safety management: has  supervision  Risk factors/concerns considered for safety management: (check all that apply) [x]  Prior attempt /pica                                     []  Hopelessness       []  Family history of suicide                    [x]  Impulsivity []  Depression                                         []  Aggression []  Substance abuse/dependence          []  Isolation []  Physical illness/chronic pain              []  Barriers to accessing treatment []  Recent loss                                        []  Unwillingness to seek help []  Access to lethal means                      []  Female gender []  Age over 34                                        [x]  Unmarried  Is there a safety management plan with the patient and treatment team to minimize risk factors and promote protective factors:     [x]  YES      []  NO            Explain: sitter watching here/ may need invega sustenna       Based on my current evaluation and risk assessment of the patient at the time of this encounter, this patient is considered to be at:   []    Low Risk                      [x]   Moderate Risk                     []   High Risk  *RISK ASSESSMENT Risk assessment is a dynamic process; it is possible that this patient's condition, and risk level, may change. This should be re-evaluated and managed over time as appropriate. Please re-consult psychiatric consult services if additional assistance is needed in terms of risk assessment and management. If your team decides to discharge this patient, please advise the patient how to best access emergency psychiatric services, or to call 911, if their condition worsens or they feel unsafe in any way.  CW Antonietta Breach, M. D., [facility]

## 2023-07-16 NOTE — ED Provider Notes (Signed)
[place] EMERGENCY DEPARTMENT AT [facility] Provider Note   CSN: [id number] Arrival date & time: 07/16/23  1629     History  Chief Complaint  Patient presents with   Swallowed Foreign Body    Megan Manning is a 42 y.o. female.  Patient sent from [facility] after swallowing a hospital ID band and bandaids earlier today. No vomiting. She reports discomfort in the epigastric region.   The history is provided by the patient. No language interpreter was used.  Swallowed Foreign Body       Home Medications Prior to Admission medications   Medication Sig Start Date End Date Taking? Authorizing Provider  acetaminophen (TYLENOL) 500 MG tablet Take 1,000 mg by mouth every 6 (six) hours as needed for moderate pain. Take 1,000 mg by mouth every 6-8 hours as needed for discomfort Patient not taking: Reported on 10/24/2022    [provider]  acetaminophen (TYLENOL) 650 MG CR tablet Take 650 mg by mouth every 8 (eight) hours as needed for pain.    [provider]  albuterol (PROVENTIL) (2.5 MG/3ML) 0.083% nebulizer solution Take 3 mLs (2.5 mg total) by nebulization every 6 (six) hours as needed for wheezing or shortness of breath. 08/07/22   Roxy Horseman, PA-C  albuterol (VENTOLIN HFA) 108 (90 Base) MCG/ACT inhaler Inhale 2 puffs into the lungs every 6 (six) hours as needed for wheezing (or coughing). 07/13/19   [provider]  ammonium lactate (LAC-HYDRIN) 12 % lotion Apply 1 Application topically See admin instructions. Apply to the hands and feet 2 times a day    [provider]  asenapine (SAPHRIS) 5 MG SUBL 24 hr tablet Place 5 mg under the tongue 2 (two) times daily as needed (for agitation).    [provider]  ASTEPRO 205.5 MCG/SPRAY SOLN Place 1 spray into both nostrils 2 (two) times daily.    [provider]  atorvastatin (LIPITOR) 20 MG tablet Take 20 mg by mouth at bedtime. 03/09/21   [provider]   BENZOCAINE, TOPICAL, 10 % OINT Apply 1 Application topically 4 (four) times daily as needed (vaginal area pain). 10/24/22   Eustace Moore, MD  diclofenac Sodium (VOLTAREN) 1 % GEL Apply topically as directed.    [provider]  divalproex (DEPAKOTE ER) 500 MG 24 hr tablet Take 500 mg by mouth 2 (two) times daily.    [provider]  fluconazole (DIFLUCAN) 150 MG tablet Give 1 tablet today.  Repeat 1 week 10/24/22   Eustace Moore, MD  furosemide (LASIX) 40 MG tablet Take 0.5 tablets (20 mg total) by mouth daily. 06/29/22   Elgergawy, Leana Roe, MD  gabapentin (NEURONTIN) 300 MG capsule Take 300-600 mg by mouth See admin instructions. Take 300 mg by mouth in the morning and 600 mg at bedtime    [provider]  INVEGA SUSTENNA 234 MG/1.5ML injection Inject 234 mg into the muscle every 30 (thirty) days.    [provider]  levothyroxine (SYNTHROID) 88 MCG tablet Take 1 tablet (88 mcg total) by mouth daily. Patient taking differently: Take 88 mcg by mouth daily before breakfast. 11/17/18   Lamptey, Britta Mccreedy, MD  loratadine (CLARITIN) 10 MG tablet Take 1 tablet (10 mg total) by mouth daily. 03/16/17   Clapacs, Jackquline Denmark, MD  LORazepam (ATIVAN) 1 MG tablet Take 1 tablet (1 mg total) by mouth every 8 (eight) hours as needed for anxiety. Patient taking differently: Take 1 mg by mouth 3 (  three) times daily. 06/29/22   Elgergawy, Leana Roe, MD  medroxyPROGESTERone (DEPO-PROVERA) 150 MG/ML injection Inject 150 mg into the muscle every 3 (three) months. 05/04/22   [provider]  metoprolol succinate (TOPROL-XL) 100 MG 24 hr tablet Take 1 tablet (100 mg total) by mouth daily. Take with or immediately following a meal. 06/30/22   Elgergawy, Leana Roe, MD  montelukast (SINGULAIR) 10 MG tablet Take 10 mg by mouth at bedtime.    [provider]  mupirocin ointment (BACTROBAN) 2 % Apply 1 Application topically 2 (two) times daily. For skin wounds 10/24/22   Eustace Moore, MD  nystatin (MYCOSTATIN/NYSTOP) powder Apply 1 Application topically 2 (two) times daily.    [provider]  Olopatadine HCl 0.2 % SOLN Place 1 drop into both eyes in the morning and at bedtime.    [provider]  omeprazole (PRILOSEC) 20 MG capsule Take 1 capsule (20 mg total) by mouth daily. Patient taking differently: Take 20 mg by mouth in the morning. 03/16/17   Clapacs, Jackquline Denmark, MD  polyethylene glycol (MIRALAX / GLYCOLAX) 17 g packet Take 17 g by mouth daily as needed. Patient taking differently: Take 17 g by mouth daily as needed for mild constipation. 06/29/22   Elgergawy, Leana Roe, MD  potassium chloride SA (KLOR-CON M) 20 MEQ tablet Take 0.5 tablets (10 mEq total) by mouth daily. Patient taking differently: Take 20 mEq by mouth daily. 06/29/22   Elgergawy, Leana Roe, MD  prazosin (MINIPRESS) 5 MG capsule Take 5 mg by mouth at bedtime.    [provider]  senna-docusate (SENOKOT-S) 8.6-50 MG tablet Take 1 tablet by mouth at bedtime. 06/29/22   Elgergawy, Leana Roe, MD  sertraline (ZOLOFT) 50 MG tablet Take 50 mg by mouth every evening.    [provider]  Vitamin D, Ergocalciferol, (DRISDOL) 1.25 MG (50000 UNIT) CAPS capsule Take 50,000 Units by mouth every Thursday. 05/27/22   [provider]      Allergies    Chlorpromazine    Review of Systems   Review of Systems  Physical Exam Updated Vital Signs BP (!) 149/82 (BP Location: Right Wrist)   Pulse 80   Temp 98.6 F (37 C) (Oral)   Resp 20   SpO2 95%  Physical Exam Vitals and nursing note reviewed.  Constitutional:      General: She is not in acute distress.    Appearance: She is well-developed. She is not ill-appearing.  Pulmonary:     Effort: Pulmonary effort is normal.  Abdominal:     General: There is no distension.     Palpations: Abdomen is soft.  Musculoskeletal:        General: Normal range of motion.     Cervical back: Normal range of motion.  Skin:     General: Skin is warm and dry.  Neurological:     Mental Status: She is alert and oriented to person, place, and time.     ED Results / Procedures / Treatments   Labs (all labs ordered are listed, but only abnormal results are displayed) Labs Reviewed  HCG, SERUM, QUALITATIVE    EKG None  Radiology DG Abdomen 1 View Result Date: 07/16/2023 CLINICAL DATA:  Swallowed foreign body. EXAM: ABDOMEN - 1 VIEW COMPARISON:  10/11/2022 FINDINGS: A metallic snap is projected over the mid pelvis, likely on clothing. Correlation with physical examination is suggested. Otherwise, no radiopaque metallic foreign bodies are demonstrated within the field of view. Scattered gas  and stool in the colon. No small or large bowel distention. No radiopaque stones. Degenerative changes in the spine. Soft tissue contours appear intact. Visualized lung bases are clear. IMPRESSION: 1. No radiopaque foreign bodies are demonstrated in the field of view. A metallic snap is projecting over the central pelvis, likely from extrinsic clothing. Correlate with physical examination. 2. Normal bowel gas pattern. Electronically Signed   By: Burman Nieves M.D.   On: 07/16/2023 18:37   DG Chest 2 View Result Date: 07/15/2023 CLINICAL DATA:  42 year old female swallowed a bottle cap, sensation in the middle of her chest. EXAM: CHEST - 2 VIEW COMPARISON:  Chest CTA 10/13/2022 and earlier. FINDINGS: Semi upright AP and lateral views at 1221 hours. Chronic elevation of the right hemidiaphragm, low lung volumes are stable since March. Normal cardiac size and mediastinal contours. Visualized tracheal air column is within normal limits. Stable right lung base atelectasis and otherwise the lungs are essentially clear. No pneumothorax or pleural effusion. Arms are not raised on the lateral view limiting its utility. No radiopaque foreign body identified. Negative visible bowel gas pattern. IMPRESSION: 1. No radiopaque foreign body identified.  No acute cardiopulmonary abnormality. 2. 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 Electronically Signed   By: Odessa Fleming M.D.   On: 07/15/2023 16:04    Procedures Procedures    Medications Ordered in ED Medications  ipratropium-albuterol (DUONEB) 0.5-2.5 (3) MG/3ML nebulizer solution 3 mL (3 mLs Nebulization Given 07/16/23 2230)    ED Course/ Medical Decision Making/ A&P                                 Medical Decision Making This patient presents to the ED for concern of ingestion, this involves an extensive number of treatment options, and is a complaint that carries with it a high risk of complications and morbidity.  The differential diagnosis includes bowel blockage, perforation, psychosis   Co morbidities that complicate the patient evaluation  History of same behavior, intellectual disability, overdose, pica, adjustment DO, borderline personality DO, HTN, thyroid disease   Additional history obtained:  Additional history and/or information obtained from chart review, notable for Lauren, Family Support Caregiver - states to me on phone contact with her that the patient was making suicidal statements at home, threatened to lie down in traffic.   Chart review from ED visit 07/15/23 where a psychiatric evaluation was provided and patient was found stable to discharge to home.      Lab Tests:  I Ordered, and personally interpreted labs.  The pertinent results include:  Labs done last night and not repeated this visit.     Imaging Studies ordered:  I ordered imaging studies including Abdominal I independently visualized and interpreted imaging which showed Nonobstructive pattern  Problem List / ED Course:  Patient to ED after ingesting bandaids, hospital arm band and presenting to [facility] voluntarily. Imaging reassuring for absence of radiopaque FB in the abdomen.  During her ED evaluation, the patient developed wheezing. No hypoxia or cyanosis. She reports history of  asthma with inhaler use at home. DuoNeb provided with improvement. CXR added to insure no FB given history of ingestion.  Family care giver concerned for suicidal ideation and requests repeat psychiatric evaluation while in ED. She is pending TTS consultation.   Consultations Obtained:  I requested consultation with the TTS consultation,  and discussed lab and imaging findings as well as pertinent plan - they  recommend: patient not felt to be suicidal and does not meet criteria for admission. The psychiatrist, Dr. [name], advises she may benefit from her Invega dosing if we were able to confirm she had not had her dose for December.   23:28: Attempted to contact Lauren - her family support caregiver, but attempt unsuccessful. LM.   Reevaluation:  After the interventions noted above, I reevaluated the patient and found that they have :improved   Social Determinants of Health:  Has Family support care giver   Disposition:  After consideration of the diagnostic results and the patients response to treatment, I feel that the patient would benefit from: Disposition pending: CXR pending r/o FB; awaiting contact with Lauren, family support caregiver [phone]), to inquire as to whether she's had her Hinda Glatter (if not dose which should help with symptom control) and for plan for discharge home (per psych, she is cleared for discharge).   Amount and/or Complexity of Data Reviewed Labs: ordered. Radiology: ordered.           Final Clinical Impression(s) / ED Diagnoses Final diagnoses:  None    Rx / DC Orders ED Discharge Orders     None         Danne Harbor 07/17/23 0009    Terrilee Files, MD 07/17/23 1157

## 2023-07-16 NOTE — ED Triage Notes (Signed)
Pt BIB [facility] from [facility] for swallowing foreign objects (hospital band, [employer] box, and bandaids that had been on her body.   Pt was seen at [facility] yesterday for same per EMS. Pt states she did it b/c she was upset but not to hurt herself.   VSS

## 2023-07-16 NOTE — Progress Notes (Signed)
   07/16/23 1521  [facility] Triage Screening (Walk-ins at [facility] only)  How Did You Hear About [name]? Family/Friend  What Is the Reason for Your Visit/Call Today? Schoenbachler is a 42 year old female presenting to [facility] accompanied by ALF staff. Pt reports that she was here yesterday from swallowing a bottle cap. Today the pt presented with similar issues of swallowing bracelet. Pt is diagnosed with moderate IDD. EMS in route  How Long Has This Been Causing You Problems? <Week  Have You Recently Had Any Thoughts About Hurting Yourself? No  Are You Planning to Commit Suicide/Harm Yourself At This time? No  Have you Recently Had Thoughts About Hurting Someone Karolee Ohs? No  Are You Planning To Harm Someone At This Time? No  Physical Abuse Denies  Verbal Abuse Denies  Sexual Abuse Denies  Exploitation of patient/patient's resources Denies  Self-Neglect Denies  Possible abuse reported to: Other (Comment)  Are you currently experiencing any auditory, visual or other hallucinations? No  Have You Used Any Alcohol or Drugs in the Past 24 Hours? No  Do you have any current medical co-morbidities that require immediate attention? No  Clinician description of patient physical appearance/behavior: irritable  What Do You Feel Would Help You the Most Today? Medication(s)  If access to [facility] was not available, would you have sought care in the Emergency Department? No  Determination of Need Routine (7 days)  Options For Referral Medication Management

## 2023-07-16 NOTE — ED Notes (Signed)
Pt was given sandwich and ginger ale, but Pt promised not to purposely throw up.

## 2023-07-16 NOTE — ED Notes (Signed)
Pt was noticed to be wheezing when walking back form the bathroom. Provider notified.

## 2023-07-16 NOTE — ED Notes (Signed)
Pt denied SI/HI.  Pt stated she was not feeling good emotionally and thought that eating the fry box would help her.

## 2023-07-17 ENCOUNTER — Emergency Department (HOSPITAL_COMMUNITY): Payer: MEDICAID

## 2023-07-17 LAB — RESP PANEL BY RT-PCR (RSV, FLU A&B, COVID)  RVPGX2
Influenza A by PCR: NEGATIVE
Influenza B by PCR: NEGATIVE
Resp Syncytial Virus by PCR: NEGATIVE
SARS Coronavirus 2 by RT PCR: NEGATIVE

## 2023-07-17 MED ORDER — ACETAMINOPHEN 325 MG PO TABS
650.0000 mg | ORAL_TABLET | Freq: Four times a day (QID) | ORAL | Status: DC | PRN
  Administered 2023-07-17: 650 mg via ORAL
  Filled 2023-07-17: qty 2

## 2023-07-17 MED ORDER — DIVALPROEX SODIUM 500 MG PO DR TAB
500.0000 mg | DELAYED_RELEASE_TABLET | Freq: Every day | ORAL | Status: DC
  Administered 2023-07-17 – 2023-07-18 (×2): 500 mg via ORAL
  Filled 2023-07-17 (×2): qty 2

## 2023-07-17 MED ORDER — DEXTROMETHORPHAN POLISTIREX ER 30 MG/5ML PO SUER
30.0000 mg | Freq: Once | ORAL | Status: DC
Start: 2023-07-17 — End: 2023-07-17
  Filled 2023-07-17: qty 5

## 2023-07-17 MED ORDER — ACETAMINOPHEN-CODEINE 300-30 MG PO TABS
1.0000 | ORAL_TABLET | Freq: Once | ORAL | Status: AC
  Administered 2023-07-17: 1 via ORAL
  Filled 2023-07-17: qty 1

## 2023-07-17 MED ORDER — LEVOTHYROXINE SODIUM 88 MCG PO TABS
88.0000 ug | ORAL_TABLET | Freq: Every day | ORAL | Status: DC
  Administered 2023-07-17 – 2023-07-18 (×2): 88 ug via ORAL
  Filled 2023-07-17 (×3): qty 1

## 2023-07-17 MED ORDER — ACETAMINOPHEN 325 MG PO TABS: 650.0000 mg | ORAL_TABLET | Freq: Once | ORAL | Status: DC

## 2023-07-17 MED ORDER — LORAZEPAM 1 MG PO TABS
1.0000 mg | ORAL_TABLET | Freq: Once | ORAL | Status: AC
  Administered 2023-07-17: 1 mg via ORAL
  Filled 2023-07-17: qty 1

## 2023-07-17 MED ORDER — PALIPERIDONE PALMITATE ER 234 MG/1.5ML IM SUSY
234.0000 mg | PREFILLED_SYRINGE | Freq: Once | INTRAMUSCULAR | Status: DC
  Filled 2023-07-17: qty 1.5

## 2023-07-17 MED ORDER — HALOPERIDOL 5 MG PO TABS: 5.0000 mg | ORAL_TABLET | Freq: Once | ORAL | Status: DC

## 2023-07-17 MED ORDER — ALBUTEROL SULFATE (2.5 MG/3ML) 0.083% IN NEBU
2.5000 mg | INHALATION_SOLUTION | Freq: Once | RESPIRATORY_TRACT | Status: AC
  Administered 2023-07-17: 2.5 mg via RESPIRATORY_TRACT
  Filled 2023-07-17: qty 3

## 2023-07-17 MED ORDER — SERTRALINE HCL 50 MG PO TABS: 50.0000 mg | ORAL_TABLET | Freq: Every day | ORAL | Status: DC

## 2023-07-17 MED ORDER — BENZONATATE 100 MG PO CAPS
200.0000 mg | ORAL_CAPSULE | Freq: Once | ORAL | Status: AC
  Administered 2023-07-17: 200 mg via ORAL
  Filled 2023-07-17: qty 2

## 2023-07-17 MED ORDER — DIAZEPAM 5 MG PO TABS: 5.0000 mg | ORAL_TABLET | Freq: Two times a day (BID) | ORAL | Status: DC | PRN

## 2023-07-17 MED ORDER — METOPROLOL SUCCINATE ER 25 MG PO TB24
100.0000 mg | ORAL_TABLET | Freq: Every day | ORAL | Status: DC
  Administered 2023-07-17 – 2023-07-18 (×2): 100 mg via ORAL
  Filled 2023-07-17 (×3): qty 4

## 2023-07-17 NOTE — ED Provider Notes (Signed)
  CXR without acute findings.  Lung sounds have cleared after neb.  Cleared from psychiatric standpoint.  No call back from guardian yet.  Cannot discharge without reliable party.  Will need to hold until AM.  6:36 AM No call back from guarding overnight.  Will need to advise whether or not she has had invega shot-- if not psychiatry recommends giving today and follow-up with OP psych team.  Guardian will need to come pick her up.   Care signed out to oncoming provider at shift change.   Garlon Hatchet, PA-C 07/17/23 1610    Nira Conn, MD 07/17/23 [phone]

## 2023-07-17 NOTE — ED Notes (Signed)
Pt exhibits persistent dry cough, and states she feels no improvement after neb treatment. Discussed with Misty Stanley, PA-C and tessalon pearls administered for coughing. Pt not hypoxic and on continuous SPO2 monitor at 96-97% readings with 2L [place] in place at this time. Pt does not wear home oxygen.

## 2023-07-17 NOTE — ED Provider Notes (Signed)
Patient has been throwing things at staff and ingesting non-food items.  Dr. Fredderick Phenix requested TTS re-consult.  Per Eligha Bridegroom, NP with psychiatry, patient does not require a reconsult at this time.  Patient needs to be placed in safe environment while she awaits pickup.  See provider note for more detail.  Items were removed from patient and vicinity.  Informed by RN that Hinda Glatter is not readily available here in the hospital.    Patient's caregiver, Lauren, came to the ED to pick up patient.  She expressed concerns about patient's behavior and if she would be safe to return to [facility].  She states patient was discharged from [facility] with all medications with the exception of Haldol, which she was to take BID.  Lauren would like to know if there are medication options to help with patient's behavior to make it safer for her to return home.  Reached out to Eligha Bridegroom, NP via secure chat regarding this concern, but did not hear back.  Lauren reached out to her supervisor who states patient is not to return to [facility] tonight due to safety issues and due to there being children in the home.    Patient was given 5 mg dose of haldol while in the ED.  TOC was consulted regarding placement for this patient since current AFL is not accepting her back at this time.  Patient will be placed in TOC boarder status.  Med rec to be completed so home meds can be ordered.    Lenard Simmer, PA-C 07/17/23 2334    Rolan Bucco, MD 07/18/23 1501
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
## 2023-07-17 NOTE — ED Notes (Signed)
Call back received at this time from pt AFL caregiver Dione Housekeeper returning phone call to the provider after message left for the provider to return call as soon as able to discuss ongoing issues with pt. Transferred at this time to Griselda Miner.

## 2023-07-17 NOTE — Discharge Instructions (Addendum)
You have been seen today for your complaint of psychiatric issues. Follow up with: Your psychiatrist as soon as possible Please seek immediate medical care if you develop any of the following symptoms: You have a fever. You have pain in your chest or your belly. You cough up blood. You have blood in your poop. You have blood in your vomit after treatment. At this time there does not appear to be the presence of an emergent medical condition, however there is always the potential for conditions to change. Please read and follow the below instructions.  Do not take your medicine if  develop an itchy rash, swelling in your mouth or lips, or difficulty breathing; call 911 and seek immediate emergency medical attention if this occurs.  You may review your lab tests and imaging results in their entirety on your MyChart account.  Please discuss all results of fully with your primary care provider and other specialist at your follow-up visit.  Note: Portions of this text may have been transcribed using voice recognition software. Every effort was made to ensure accuracy; however, inadvertent computerized transcription errors may still be present.   Do not eat or swallow anything other than the food you are provided.   Monitor patient and avoid providing her with small non-food items that she may try to swallow.   Follow up closely with your primary care doctor and behavioral health provider in the next 1-2 weeks.   Return to ER if worse, new symptoms, fevers, new/severe pain, unable to swallow, trouble breathing, or other concern.

## 2023-07-17 NOTE — ED Provider Notes (Signed)
Physical Exam  BP (!) 132/54 (BP Location: Left Arm)   Pulse 84   Temp 98.5 F (36.9 C) (Oral)   Resp 20   Ht 5\' 3"  (1.6 m)   Wt (!) 154.2 kg   SpO2 96%   BMI 60.22 kg/m   Physical Exam Vitals and nursing note reviewed.  Constitutional:      General: She is not in acute distress.    Appearance: Normal appearance. She is obese. She is not ill-appearing.     Comments: Resting comfortably in bed  HENT:     Head: Normocephalic and atraumatic.  Pulmonary:     Effort: Pulmonary effort is normal. No respiratory distress.  Abdominal:     General: Abdomen is flat.  Musculoskeletal:        General: Normal range of motion.     Cervical back: Neck supple.  Skin:    General: Skin is warm and dry.  Neurological:     Mental Status: She is alert and oriented to person, place, and time.  Psychiatric:        Mood and Affect: Mood normal.        Behavior: Behavior normal.     Procedures  Procedures  ED Course / MDM   Clinical Course as of 07/17/23 1433  Sat Jul 17, 2023  7253 Follow up with guardian in regards to invega, cleared for DC by psych. [AS]  0709 Discussed with caregiver, Lauren.  She is unclear of when patient last received her Invega injection.  Believes it may have been when she was in the [place] hospital within the past month.  She is following up with [facility] regarding this and will update if she finds any more information.  States she works until 7 PM and will pick patient up immediately after work.  She will also try to get off early and pick the patient up. [AS]  1210 Spoke with caregiver, Lauren again.  She followed up regarding medications.  States patient has not received any injectable medications in the past 30 days.  Will order her Invega dose here.  Updated arrival between 7:15 and 7:30 PM [AS]    Clinical Course User Index [AS] Dennison Mcdaid, Edsel Petrin, PA-C   Medical Decision Making Amount and/or Complexity of Data Reviewed Radiology: ordered. ECG/medicine  tests: ordered.  Risk OTC drugs. Prescription drug management.  Assumed care at shift change.  Please see initial provider note for full HPI.  Patient initially seen for ingestion of a hospital ID band and Band-Aids.  Patient reportedly does this when she is under stress.  She has been living with a new caretaker for the past 2 weeks.  Has been seen a few times for this already.  Behavioral health recommends outpatient follow-up.  Question if she should receive another dose of her Invega.  Patient believes she received this medication within the past 30 days.   I discussed the plan with caretaker, Leotis Shames, via phone call.  States that she has to work until 7 PM but will pick up the patient as soon as she gets done with work.  Will attempt to leave early.  Unsure when her last Invega dose was.  She will attempt to call [facility] and get medication administration records.  Care handed off to oncoming provider pending caretaker picking the patient up.  Caretaker was able to locate medical records and informed that Hinda Glatter has not been given in the past 30 days.  Dose was ordered in the emergency department.  Caretaker plans to pick patient up between 7:15 and 7:30 p.m. Plan may change at the discretion of the oncoming provider.  At this time there does not appear to be any evidence of an acute emergency medical condition and the patient appears stable for discharge with appropriate outpatient follow up. Diagnosis was discussed with patient who verbalizes understanding of care plan and is agreeable to discharge. I have discussed return precautions with patient who verbalizes understanding. Patient encouraged to follow-up with their PCP within 1 week. All questions answered.  Note: Portions of this report may have been transcribed using voice recognition software. Every effort was made to ensure accuracy; however, inadvertent computerized transcription errors may still be present.   Michelle Piper,  PA-C 07/17/23 1513    Terald Sleeper, MD 07/17/23 [phone]

## 2023-07-17 NOTE — Consult Note (Signed)
   This is a 41 year old female who has a history of borderline personality disorder ,intellectual disability, history of overdose ,pica Adjustment disorder, history of self-injury  and agitation. Pt originally seen by [facility] telepsychiatry Dr. Arther Dames for ingesting foreign objects after becoming upset at her ALF, and Dr. Arther Dames psychiatrically cleared her. Pt does have hx of nonsuicidal ingestion of random objects. Pt was also seen for this exact reason at the [facility] on 12/19. Pt denies suicidal intent, states for some reason when she is stressed it calms her to ingest foreign objects.   Pt currently resides at an ALF. She was psych cleared to return, however since waiting in the ED for ALF pick up pt has been putting EKG cords in her mouth and trying to eat styrofoam cups. ED team reconsulted TTS, however after further discussion with Dr. Fredderick Phenix, I do not feel a reconsult is needed at this time.   Ultimately, the environment needs to be made safe for patient while she waits pick up. Please ensure safe environment with no lose objects or ingestible items available to her. ALF has already been instructed on these safety concerns as well. Per chart review appears patient could be due for December Invega Sustenna injection, however that will be handled in the outpatient setting. Pt does not meet criteria for inpatient psychiatric admission, as she consistently denies suicidal intent, and would benefit from return to ALF with safety precautions in place and follow up with facility psychiatrist asap.
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
## 2023-07-17 NOTE — ED Notes (Signed)
Pt belongings placed in locker #1 

## 2023-07-18 MED ORDER — METOPROLOL SUCCINATE ER 25 MG PO TB24: 100.0000 mg | ORAL_TABLET | Freq: Every day | ORAL | Status: DC

## 2023-07-18 MED ORDER — PANTOPRAZOLE SODIUM 40 MG PO TBEC
40.0000 mg | DELAYED_RELEASE_TABLET | Freq: Two times a day (BID) | ORAL | Status: DC
  Administered 2023-07-18: 40 mg via ORAL
  Filled 2023-07-18: qty 1

## 2023-07-18 MED ORDER — CLONIDINE HCL 0.2 MG PO TABS
0.1000 mg | ORAL_TABLET | Freq: Two times a day (BID) | ORAL | Status: DC | PRN
  Administered 2023-07-18: 0.1 mg via ORAL
  Filled 2023-07-18: qty 1

## 2023-07-18 MED ORDER — QUETIAPINE FUMARATE 200 MG PO TABS: 200.0000 mg | ORAL_TABLET | Freq: Every day | ORAL | Status: DC

## 2023-07-18 MED ORDER — HALOPERIDOL 5 MG PO TABS: 5.0000 mg | ORAL_TABLET | Freq: Three times a day (TID) | ORAL | 0 refills | Status: DC | PRN

## 2023-07-18 MED ORDER — SERTRALINE HCL 50 MG PO TABS: 50.0000 mg | ORAL_TABLET | Freq: Every evening | ORAL | Status: DC

## 2023-07-18 MED ORDER — DIAZEPAM 5 MG PO TABS
10.0000 mg | ORAL_TABLET | Freq: Three times a day (TID) | ORAL | Status: DC | PRN
Start: 2023-07-18 — End: 2023-07-18

## 2023-07-18 MED ORDER — GUAIFENESIN 100 MG/5ML PO LIQD
5.0000 mL | Freq: Two times a day (BID) | ORAL | Status: DC
  Administered 2023-07-18: 5 mL via ORAL
  Filled 2023-07-18: qty 5
  Filled 2023-07-18: qty 10
  Filled 2023-07-18: qty 5

## 2023-07-18 MED ORDER — LORATADINE 10 MG PO TABS
10.0000 mg | ORAL_TABLET | Freq: Every day | ORAL | Status: DC
  Administered 2023-07-18: 10 mg via ORAL
  Filled 2023-07-18: qty 1

## 2023-07-18 MED ORDER — DOXAZOSIN MESYLATE 2 MG PO TABS
2.0000 mg | ORAL_TABLET | Freq: Every day | ORAL | Status: DC
  Filled 2023-07-18 (×2): qty 1

## 2023-07-18 MED ORDER — FUROSEMIDE 20 MG PO TABS
20.0000 mg | ORAL_TABLET | Freq: Every day | ORAL | Status: DC
  Administered 2023-07-18: 20 mg via ORAL
  Filled 2023-07-18: qty 1

## 2023-07-18 MED ORDER — DOCUSATE SODIUM 100 MG PO CAPS
200.0000 mg | ORAL_CAPSULE | Freq: Every day | ORAL | Status: DC
  Administered 2023-07-18: 200 mg via ORAL
  Filled 2023-07-18: qty 2

## 2023-07-18 MED ORDER — FLUPHENAZINE HCL 5 MG PO TABS
7.5000 mg | ORAL_TABLET | Freq: Two times a day (BID) | ORAL | Status: DC
  Administered 2023-07-18: 7.5 mg via ORAL
  Filled 2023-07-18 (×3): qty 2

## 2023-07-18 MED ORDER — ALBUTEROL SULFATE HFA 108 (90 BASE) MCG/ACT IN AERS
2.0000 | INHALATION_SPRAY | Freq: Four times a day (QID) | RESPIRATORY_TRACT | Status: DC | PRN
  Administered 2023-07-18: 2 via RESPIRATORY_TRACT
  Filled 2023-07-18: qty 6.7

## 2023-07-18 MED ORDER — CALCIUM CARBONATE 1250 (500 CA) MG PO TABS
1.0000 | ORAL_TABLET | Freq: Every day | ORAL | Status: DC
  Administered 2023-07-18: 1250 mg via ORAL
  Filled 2023-07-18 (×2): qty 1

## 2023-07-18 MED ORDER — CELECOXIB 200 MG PO CAPS
200.0000 mg | ORAL_CAPSULE | Freq: Two times a day (BID) | ORAL | Status: DC
  Administered 2023-07-18: 200 mg via ORAL
  Filled 2023-07-18 (×3): qty 1

## 2023-07-18 MED ORDER — LEVOTHYROXINE SODIUM 88 MCG PO TABS: 88.0000 ug | ORAL_TABLET | Freq: Every day | ORAL | Status: DC

## 2023-07-18 NOTE — ED Provider Notes (Addendum)
Emergency Medicine Observation Re-evaluation Note  Megan Manning is a 42 y.o. female, seen on rounds today.  Pt initially presented to the ED for complaints of periods of feeling anxious/stress and ingesting non-food items. No new c/o this AM. No cp or sob. No trouble swallowing. No vomiting. Pt has been psych cleared twice, and is awaiting ALF to pick her up.   Physical Exam  BP (!) 162/111 (BP Location: Right Arm)   Pulse 78   Temp 98.5 F (36.9 C) (Oral)   Resp 16   Ht 1.6 m (5\' 3" )   Wt (!) 154.2 kg   SpO2 94%   BMI 60.22 kg/m  Physical Exam General: content, nad.  Cardiac: regular rate.  Lungs: breathing comfortably. Psych:calm.   ED Course / MDM   I have reviewed the labs performed to date as well as medications administered while in observation.  Recent changes in the last 24 hours include ED obs, reassessment.   Plan  Current plan is  that patient has been psych cleared for d/c by [facility] team and is awaiting ALF to pick her up.     Cathren Laine, MD 07/18/23 1338   Recheck remains alert, no distress. Vitals normal. Appears stable for d/c back to ALF.    Cathren Laine, MD 07/18/23 [phone]

## 2023-07-18 NOTE — ED Notes (Signed)
Pt currently 93-94% room air at this time. Denies difficulty breathing at this time.

## 2023-07-18 NOTE — ED Provider Notes (Signed)
12:59 AM Psych cleared x2. Pending TOC consult in AM. Daily meds ordered.   Antony Madura, PA-C 07/18/23 0059    Gilda Crease, MD 07/18/23 [phone]

## 2023-07-18 NOTE — ED Notes (Signed)
Pt c/o difficulty breathing, pt noted 84-85% room air, prn inhaler provided. Will continue to monitor.

## 2023-07-18 NOTE — ED Notes (Signed)
Spoke w/pt's caretaker Leotis Shames, she will be coming to get pt after she gets off work this afternoon.

## 2023-07-18 NOTE — ED Provider Notes (Signed)
Emergency Medicine Observation Re-evaluation Note  SHERRINE BOWLEN is a 42 y.o. female, seen on rounds today.  Pt initially presented to the ED for complaints of Swallowed Foreign Body Currently, the patient is sleeping.  Physical Exam  BP (!) 184/71   Pulse 92   Temp 98.4 F (36.9 C) (Oral)   Resp 16   Ht 5\' 3"  (1.6 m)   Wt (!) 154.2 kg   SpO2 93%   BMI 60.22 kg/m  Physical Exam General: Sleeping Cardiac: Extremities well-perfused Lungs: Breathing is unlabored Psych: Deferred  ED Course / MDM  EKG:EKG Interpretation Date/Time:  Saturday July 17 2023 09:26:50 EST Ventricular Rate:  104 PR Interval:  122 QRS Duration:  74 QT Interval:  370 QTC Calculation: 486 R Axis:   20  Text Interpretation: Sinus tachycardia with baseline movement artifact V1 Confirmed by Alvester Chou [id number]) on 07/17/2023 9:30:38 AM  I have reviewed the labs performed to date as well as medications administered while in observation.  Recent changes in the last 24 hours include evaluated by TTS who does not recommend inpatient psychiatric admission.  ALF is resistant to accepting her back.  TOC consult has been ordered.  Plan  Current plan is for TOC to assist in disposition.    Gloris Manchester, MD 07/18/23 [phone]

## 2023-07-18 NOTE — Progress Notes (Signed)
CSW spoke with patient's caregiver Leotis Shames ([facility] provider, [facility]) who states patient was admitted into her home on 06/30/23. Lauren states there are younger children in the home. Lauren states patient goes out into the community to a day program during the week. Lauren states patient's legal guardian is Kennyth Arnold of [employer] agency. Lauren states patient has ADHD, MDD, Borderline Personality Disorder, and Adjustment Disorder. Lauren states patient is not in contact with any of her biological family members other than her grandmother. Lauren states patient was hospitalized from September to December of this year in [place]. Lauren states patient must be given her home meds prior to her coming to pick her up. Lauren states when patient was discharged from [facility], the Haldol prescription was not sent over and is requesting this be sent to the pharmacy for her to pick up ([facility] on [address]). Lauren requesting to speak with medical team abut medications prior to picking patient up.  CSW spoke with Crystal, RN and Dr. Durwin Nora regarding patient and requests of AFL provider.  Edwin Dada, MSW, LCSW Transitions of Care  Clinical Social Worker II [phone]

## 2023-07-19 ENCOUNTER — Encounter (HOSPITAL_COMMUNITY): Payer: Self-pay

## 2023-07-19 ENCOUNTER — Inpatient Hospital Stay (HOSPITAL_COMMUNITY)
Admission: EM | Admit: 2023-07-19 | Discharge: 2023-10-13 | DRG: 189 | Payer: MEDICAID | Source: Other Acute Inpatient Hospital | Attending: Family Medicine | Admitting: Family Medicine

## 2023-07-19 ENCOUNTER — Other Ambulatory Visit: Payer: Self-pay

## 2023-07-19 DIAGNOSIS — R451 Restlessness and agitation: Secondary | ICD-10-CM | POA: Diagnosis present

## 2023-07-19 DIAGNOSIS — Z23 Encounter for immunization: Secondary | ICD-10-CM

## 2023-07-19 DIAGNOSIS — D509 Iron deficiency anemia, unspecified: Secondary | ICD-10-CM | POA: Diagnosis present

## 2023-07-19 DIAGNOSIS — J9811 Atelectasis: Secondary | ICD-10-CM | POA: Diagnosis present

## 2023-07-19 DIAGNOSIS — I110 Hypertensive heart disease with heart failure: Secondary | ICD-10-CM | POA: Diagnosis present

## 2023-07-19 DIAGNOSIS — F913 Oppositional defiant disorder: Secondary | ICD-10-CM | POA: Diagnosis present

## 2023-07-19 DIAGNOSIS — Z789 Other specified health status: Secondary | ICD-10-CM

## 2023-07-19 DIAGNOSIS — R112 Nausea with vomiting, unspecified: Secondary | ICD-10-CM | POA: Diagnosis present

## 2023-07-19 DIAGNOSIS — R000 Tachycardia, unspecified: Secondary | ICD-10-CM | POA: Diagnosis present

## 2023-07-19 DIAGNOSIS — J9601 Acute respiratory failure with hypoxia: Principal | ICD-10-CM | POA: Diagnosis present

## 2023-07-19 DIAGNOSIS — R0602 Shortness of breath: Principal | ICD-10-CM

## 2023-07-19 DIAGNOSIS — Z751 Person awaiting admission to adequate facility elsewhere: Secondary | ICD-10-CM

## 2023-07-19 DIAGNOSIS — E785 Hyperlipidemia, unspecified: Secondary | ICD-10-CM | POA: Diagnosis present

## 2023-07-19 DIAGNOSIS — E1140 Type 2 diabetes mellitus with diabetic neuropathy, unspecified: Secondary | ICD-10-CM | POA: Diagnosis present

## 2023-07-19 DIAGNOSIS — G588 Other specified mononeuropathies: Secondary | ICD-10-CM | POA: Diagnosis present

## 2023-07-19 DIAGNOSIS — N898 Other specified noninflammatory disorders of vagina: Secondary | ICD-10-CM

## 2023-07-19 DIAGNOSIS — I890 Lymphedema, not elsewhere classified: Secondary | ICD-10-CM | POA: Diagnosis present

## 2023-07-19 DIAGNOSIS — Z1152 Encounter for screening for COVID-19: Secondary | ICD-10-CM

## 2023-07-19 DIAGNOSIS — R197 Diarrhea, unspecified: Secondary | ICD-10-CM | POA: Diagnosis present

## 2023-07-19 DIAGNOSIS — M7989 Other specified soft tissue disorders: Secondary | ICD-10-CM | POA: Diagnosis present

## 2023-07-19 DIAGNOSIS — F319 Bipolar disorder, unspecified: Secondary | ICD-10-CM | POA: Diagnosis present

## 2023-07-19 DIAGNOSIS — F79 Unspecified intellectual disabilities: Secondary | ICD-10-CM | POA: Diagnosis present

## 2023-07-19 DIAGNOSIS — K5900 Constipation, unspecified: Secondary | ICD-10-CM | POA: Diagnosis present

## 2023-07-19 DIAGNOSIS — R531 Weakness: Secondary | ICD-10-CM | POA: Diagnosis present

## 2023-07-19 DIAGNOSIS — K219 Gastro-esophageal reflux disease without esophagitis: Secondary | ICD-10-CM | POA: Diagnosis present

## 2023-07-19 DIAGNOSIS — F5083 Pica in adults: Secondary | ICD-10-CM | POA: Diagnosis present

## 2023-07-19 DIAGNOSIS — R002 Palpitations: Secondary | ICD-10-CM | POA: Diagnosis present

## 2023-07-19 DIAGNOSIS — Z781 Physical restraint status: Secondary | ICD-10-CM

## 2023-07-19 DIAGNOSIS — E662 Morbid (severe) obesity with alveolar hypoventilation: Secondary | ICD-10-CM | POA: Diagnosis present

## 2023-07-19 DIAGNOSIS — Z593 Problems related to living in residential institution: Secondary | ICD-10-CM

## 2023-07-19 DIAGNOSIS — R300 Dysuria: Secondary | ICD-10-CM | POA: Diagnosis present

## 2023-07-19 DIAGNOSIS — R079 Chest pain, unspecified: Secondary | ICD-10-CM | POA: Diagnosis present

## 2023-07-19 DIAGNOSIS — R519 Headache, unspecified: Secondary | ICD-10-CM | POA: Diagnosis present

## 2023-07-19 DIAGNOSIS — Z7289 Other problems related to lifestyle: Secondary | ICD-10-CM

## 2023-07-19 DIAGNOSIS — I5022 Chronic systolic (congestive) heart failure: Secondary | ICD-10-CM | POA: Diagnosis present

## 2023-07-19 DIAGNOSIS — R6250 Unspecified lack of expected normal physiological development in childhood: Secondary | ICD-10-CM | POA: Diagnosis present

## 2023-07-19 DIAGNOSIS — T50916A Underdosing of multiple unspecified drugs, medicaments and biological substances, initial encounter: Secondary | ICD-10-CM | POA: Diagnosis present

## 2023-07-19 DIAGNOSIS — E039 Hypothyroidism, unspecified: Secondary | ICD-10-CM | POA: Diagnosis present

## 2023-07-19 DIAGNOSIS — I10 Essential (primary) hypertension: Secondary | ICD-10-CM | POA: Insufficient documentation

## 2023-07-19 DIAGNOSIS — Z79890 Hormone replacement therapy (postmenopausal): Secondary | ICD-10-CM

## 2023-07-19 DIAGNOSIS — F603 Borderline personality disorder: Secondary | ICD-10-CM | POA: Diagnosis present

## 2023-07-19 DIAGNOSIS — R0902 Hypoxemia: Secondary | ICD-10-CM | POA: Diagnosis present

## 2023-07-19 DIAGNOSIS — Z91148 Patient's other noncompliance with medication regimen for other reason: Secondary | ICD-10-CM

## 2023-07-19 DIAGNOSIS — Z79899 Other long term (current) drug therapy: Secondary | ICD-10-CM

## 2023-07-19 DIAGNOSIS — J45901 Unspecified asthma with (acute) exacerbation: Principal | ICD-10-CM | POA: Diagnosis present

## 2023-07-19 DIAGNOSIS — L2989 Other pruritus: Secondary | ICD-10-CM | POA: Diagnosis present

## 2023-07-19 DIAGNOSIS — Z6841 Body Mass Index (BMI) 40.0 and over, adult: Secondary | ICD-10-CM

## 2023-07-19 DIAGNOSIS — R1011 Right upper quadrant pain: Secondary | ICD-10-CM | POA: Diagnosis present

## 2023-07-19 LAB — URINALYSIS, W/ REFLEX TO CULTURE (INFECTION SUSPECTED)
Bilirubin Urine: NEGATIVE
Glucose, UA: NEGATIVE mg/dL
Hgb urine dipstick: NEGATIVE
Ketones, ur: 5 mg/dL — AB
Leukocytes,Ua: NEGATIVE
Nitrite: NEGATIVE
Protein, ur: NEGATIVE mg/dL
Specific Gravity, Urine: 1.03 (ref 1.005–1.030)
pH: 5 (ref 5.0–8.0)

## 2023-07-19 LAB — CBC WITH DIFFERENTIAL/PLATELET
Abs Immature Granulocytes: 0.02 10*3/uL (ref 0.00–0.07)
Basophils Absolute: 0 10*3/uL (ref 0.0–0.1)
Basophils Relative: 1 %
Eosinophils Absolute: 0.3 10*3/uL (ref 0.0–0.5)
Eosinophils Relative: 4 %
HCT: 37.1 % (ref 36.0–46.0)
Hemoglobin: 11.3 g/dL — ABNORMAL LOW (ref 12.0–15.0)
Immature Granulocytes: 0 %
Lymphocytes Relative: 33 %
Lymphs Abs: 2 10*3/uL (ref 0.7–4.0)
MCH: 27.3 pg (ref 26.0–34.0)
MCHC: 30.5 g/dL (ref 30.0–36.0)
MCV: 89.6 fL (ref 80.0–100.0)
Monocytes Absolute: 0.3 10*3/uL (ref 0.1–1.0)
Monocytes Relative: 6 %
Neutro Abs: 3.3 10*3/uL (ref 1.7–7.7)
Neutrophils Relative %: 56 %
Platelets: 305 10*3/uL (ref 150–400)
RBC: 4.14 MIL/uL (ref 3.87–5.11)
RDW: 13 % (ref 11.5–15.5)
WBC: 5.9 10*3/uL (ref 4.0–10.5)
nRBC: 0 % (ref 0.0–0.2)

## 2023-07-19 LAB — LIPASE, BLOOD: Lipase: 34 U/L (ref 11–51)

## 2023-07-19 LAB — COMPREHENSIVE METABOLIC PANEL
ALT: 13 U/L (ref 0–44)
AST: 16 U/L (ref 15–41)
Albumin: 3.6 g/dL (ref 3.5–5.0)
Alkaline Phosphatase: 58 U/L (ref 38–126)
Anion gap: 7 (ref 5–15)
BUN: 6 mg/dL (ref 6–20)
CO2: 28 mmol/L (ref 22–32)
Calcium: 9.3 mg/dL (ref 8.9–10.3)
Chloride: 107 mmol/L (ref 98–111)
Creatinine, Ser: 0.67 mg/dL (ref 0.44–1.00)
GFR, Estimated: >60 mL/min (ref >60)
Glucose, Bld: 102 mg/dL — ABNORMAL HIGH (ref 70–99)
Potassium: 3.8 mmol/L (ref 3.5–5.1)
Sodium: 142 mmol/L (ref 135–145)
Total Bilirubin: 0.5 mg/dL (ref <1.2)
Total Protein: 6.4 g/dL — ABNORMAL LOW (ref 6.5–8.1)

## 2023-07-19 LAB — RESP PANEL BY RT-PCR (RSV, FLU A&B, COVID)  RVPGX2
Influenza A by PCR: NEGATIVE
Influenza B by PCR: NEGATIVE
Resp Syncytial Virus by PCR: NEGATIVE
SARS Coronavirus 2 by RT PCR: NEGATIVE

## 2023-07-19 MED ORDER — ACETAMINOPHEN 500 MG PO TABS
1000.0000 mg | ORAL_TABLET | Freq: Once | ORAL | Status: AC
  Administered 2023-07-19: 1000 mg via ORAL
  Filled 2023-07-19: qty 2

## 2023-07-19 MED ORDER — METOPROLOL SUCCINATE ER 25 MG PO TB24
100.0000 mg | ORAL_TABLET | Freq: Every day | ORAL | Status: DC
  Administered 2023-07-20 – 2023-09-13 (×23): 100 mg via ORAL
  Administered 2023-09-19: 25 mg via ORAL
  Administered 2023-09-21 – 2023-09-22 (×2): 100 mg via ORAL
  Filled 2023-07-19 (×3): qty 4
  Filled 2023-07-19: qty 1
  Filled 2023-07-19 (×38): qty 4

## 2023-07-19 MED ORDER — LEVOTHYROXINE SODIUM 88 MCG PO TABS
88.0000 ug | ORAL_TABLET | Freq: Every day | ORAL | Status: DC
Start: 2023-07-20 — End: 2023-10-13
  Administered 2023-07-20 – 2023-10-13 (×42): 88 ug via ORAL
  Filled 2023-07-19 (×97): qty 1

## 2023-07-19 MED ORDER — ONDANSETRON 4 MG PO TBDP
4.0000 mg | ORAL_TABLET | Freq: Once | ORAL | Status: AC
Start: 2023-07-19 — End: 2023-07-19
  Administered 2023-07-19: 4 mg via ORAL
  Filled 2023-07-19: qty 1

## 2023-07-19 MED ORDER — DOXAZOSIN MESYLATE 2 MG PO TABS
2.0000 mg | ORAL_TABLET | Freq: Every day | ORAL | Status: DC
  Administered 2023-07-19 – 2023-10-12 (×30): 2 mg via ORAL
  Filled 2023-07-19 (×86): qty 1

## 2023-07-19 MED ORDER — SERTRALINE HCL 50 MG PO TABS
50.0000 mg | ORAL_TABLET | Freq: Every day | ORAL | Status: DC
  Administered 2023-07-19 – 2023-10-02 (×26): 50 mg via ORAL
  Filled 2023-07-19 (×43): qty 1

## 2023-07-19 MED ORDER — CLONIDINE HCL 0.1 MG PO TABS
0.1000 mg | ORAL_TABLET | Freq: Two times a day (BID) | ORAL | Status: DC | PRN
  Administered 2023-09-18 – 2023-09-28 (×2): 0.1 mg via ORAL
  Filled 2023-07-19 (×3): qty 1

## 2023-07-19 MED ORDER — CALCIUM CARBONATE 1250 (500 CA) MG PO TABS
1.0000 | ORAL_TABLET | Freq: Every day | ORAL | Status: DC
  Administered 2023-07-20 – 2023-10-13 (×51): 1250 mg via ORAL
  Filled 2023-07-19 (×101): qty 1

## 2023-07-19 MED ORDER — ALBUTEROL SULFATE HFA 108 (90 BASE) MCG/ACT IN AERS
2.0000 | INHALATION_SPRAY | Freq: Four times a day (QID) | RESPIRATORY_TRACT | Status: DC | PRN
  Administered 2023-08-15 – 2023-10-06 (×14): 2 via RESPIRATORY_TRACT
  Filled 2023-07-19 (×3): qty 6.7

## 2023-07-19 MED ORDER — ACETAMINOPHEN 325 MG PO TABS
650.0000 mg | ORAL_TABLET | Freq: Four times a day (QID) | ORAL | Status: DC | PRN
  Administered 2023-07-21 – 2023-10-11 (×80): 650 mg via ORAL
  Filled 2023-07-19 (×87): qty 2

## 2023-07-19 MED ORDER — LORATADINE 10 MG PO TABS
10.0000 mg | ORAL_TABLET | Freq: Every day | ORAL | Status: DC
  Administered 2023-07-19 – 2023-10-13 (×56): 10 mg via ORAL
  Filled 2023-07-19 (×62): qty 1

## 2023-07-19 MED ORDER — ONDANSETRON 4 MG PO TBDP
4.0000 mg | ORAL_TABLET | Freq: Once | ORAL | Status: AC
  Administered 2023-07-19: 4 mg via ORAL
  Filled 2023-07-19: qty 1

## 2023-07-19 MED ORDER — DOCUSATE SODIUM 100 MG PO CAPS
200.0000 mg | ORAL_CAPSULE | Freq: Every day | ORAL | Status: DC
  Administered 2023-07-19 – 2023-10-07 (×36): 200 mg via ORAL
  Filled 2023-07-19 (×49): qty 2

## 2023-07-19 MED ORDER — PANTOPRAZOLE SODIUM 40 MG PO TBEC
40.0000 mg | DELAYED_RELEASE_TABLET | Freq: Two times a day (BID) | ORAL | Status: DC
  Administered 2023-07-19 – 2023-10-13 (×97): 40 mg via ORAL
  Filled 2023-07-19 (×121): qty 1

## 2023-07-19 MED ORDER — DIVALPROEX SODIUM 500 MG PO DR TAB
500.0000 mg | DELAYED_RELEASE_TABLET | Freq: Every day | ORAL | Status: DC
  Administered 2023-07-19 – 2023-10-03 (×50): 500 mg via ORAL
  Filled 2023-07-19 (×2): qty 1
  Filled 2023-07-19: qty 2
  Filled 2023-07-19: qty 1
  Filled 2023-07-19: qty 2
  Filled 2023-07-19 (×7): qty 1
  Filled 2023-07-19: qty 2
  Filled 2023-07-19 (×2): qty 1
  Filled 2023-07-19: qty 2
  Filled 2023-07-19 (×32): qty 1
  Filled 2023-07-19: qty 2
  Filled 2023-07-19 (×2): qty 1
  Filled 2023-07-19: qty 2
  Filled 2023-07-19 (×4): qty 1

## 2023-07-19 MED ORDER — DIAZEPAM 5 MG PO TABS
10.0000 mg | ORAL_TABLET | Freq: Three times a day (TID) | ORAL | Status: DC | PRN
  Administered 2023-07-19 – 2023-09-18 (×13): 10 mg via ORAL
  Filled 2023-07-19 (×16): qty 2

## 2023-07-19 MED ORDER — QUETIAPINE FUMARATE 50 MG PO TABS
200.0000 mg | ORAL_TABLET | Freq: Every day | ORAL | Status: DC
  Administered 2023-07-19 – 2023-10-12 (×66): 200 mg via ORAL
  Filled 2023-07-19 (×5): qty 1
  Filled 2023-07-19: qty 8
  Filled 2023-07-19 (×12): qty 1
  Filled 2023-07-19: qty 2
  Filled 2023-07-19 (×7): qty 1
  Filled 2023-07-19: qty 2
  Filled 2023-07-19 (×6): qty 1
  Filled 2023-07-19: qty 8
  Filled 2023-07-19 (×4): qty 1
  Filled 2023-07-19: qty 4
  Filled 2023-07-19 (×2): qty 1
  Filled 2023-07-19: qty 2
  Filled 2023-07-19: qty 1
  Filled 2023-07-19: qty 4
  Filled 2023-07-19 (×3): qty 1
  Filled 2023-07-19: qty 2
  Filled 2023-07-19: qty 8
  Filled 2023-07-19: qty 1
  Filled 2023-07-19: qty 8
  Filled 2023-07-19 (×3): qty 1
  Filled 2023-07-19 (×2): qty 4
  Filled 2023-07-19 (×2): qty 1
  Filled 2023-07-19: qty 4
  Filled 2023-07-19 (×7): qty 1
  Filled 2023-07-19: qty 4
  Filled 2023-07-19: qty 2
  Filled 2023-07-19: qty 1
  Filled 2023-07-19: qty 2
  Filled 2023-07-19 (×3): qty 1
  Filled 2023-07-19: qty 4
  Filled 2023-07-19 (×3): qty 2
  Filled 2023-07-19: qty 1

## 2023-07-19 NOTE — ED Notes (Signed)
Pt c/o sore throat, EDP aware, tylenol given

## 2023-07-19 NOTE — ED Notes (Addendum)
Pt ambulated to the bathroom without assistance. She is c/o sore throat again, refuses tylenol, she also states "my stomach doesn't feel good," and says "my urine smells strong." EDP advised. Pt given urine cup to collect sample

## 2023-07-19 NOTE — Progress Notes (Addendum)
2:45pm: CSW received email from [facility], [employer] of [place] stating the following:  "I did not speak to [employer], but I spoke to the director of her agency Jocelyn Lamer of [employer]. I also spoke to [employer] of [employer], the guardianship agency. It sounds like Lauren is not going to be able to accept the member back into the home due to health/safety concerns. Ethelene Browns is working to see if there are any other residential options with his agency. I am working on reaching out to other agencies to see if there's another residential provider that could accept [employer]."  CSW forwarded email to [facility] leadership.  2:15pm: CSW familiar with patient as CSW was involved with discharge back to [facility] yesterday.   CSW spoke with Delilah at who states patient's care coordinator is Dollene Primrose, but is on vacation so Rennie Natter the current point of contact.  CSW received email from [facility] stating she has been in contact with the patient's legal guardian and current residential provider. CSW requested to be included in communications to ensure updates are received in a timely manner.  Edwin Dada, MSW, LCSW Transitions of Care  Clinical Social Worker II [phone]

## 2023-07-19 NOTE — ED Notes (Signed)
Pt c/o vomiting in the bathroom though unwitnessed. Pt has not vomited since arriving. EDP aware
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
## 2023-07-19 NOTE — ED Notes (Signed)
Pt refused dinner tray and sent it back stating "I don't like that" pt has refused sandwich and crackers today as well.

## 2023-07-19 NOTE — ED Notes (Signed)
Pt now c/o right leg pain that is chronic
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
## 2023-07-19 NOTE — ED Notes (Signed)
Pt c/o sore throat, EDP aware
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
## 2023-07-20 NOTE — ED Notes (Signed)
Pt resting comfortably

## 2023-07-20 NOTE — ED Notes (Signed)
Pt c/o CP after eating lunch; pt placed on monitor, EKG done; Dr. Hyacinth Meeker notified of CP complaint and given ekg

## 2023-07-20 NOTE — ED Notes (Signed)
Ordered medications given. Pt able to swallow without difficulty. Denies any needs at this time. RR even and unlabored. Pt denies any CP or SOB.

## 2023-07-20 NOTE — ED Notes (Signed)
Lunch tray at bedside. ?

## 2023-07-20 NOTE — ED Notes (Signed)
Pt resting with eyes closed; respirations spontaneous, even, unlabored 

## 2023-07-21 MED ORDER — ALUM & MAG HYDROXIDE-SIMETH 200-200-20 MG/5ML PO SUSP
30.0000 mL | Freq: Once | ORAL | Status: AC
  Administered 2023-07-21: 30 mL via ORAL
  Filled 2023-07-21: qty 30

## 2023-07-21 NOTE — ED Notes (Signed)
patient c/o mid chest pain 10/10  at 5:17. She describe the pain as sharp needlelike and it hurt when she cough. Dr Suezanne Jacquet  was made aware and EKG was ordered.

## 2023-07-21 NOTE — ED Notes (Signed)
Pharmacy notified to send 0600 synthroid dose to Emergency Department.

## 2023-07-21 NOTE — ED Provider Notes (Signed)
Emergency Medicine Observation Re-evaluation Note  Megan Manning is a 42 y.o. female, seen on rounds today.  Pt initially presented to the ED for complaints of neuropathy pain Currently, the patient is sleeping.  Physical Exam  BP 124/69   Pulse 91   Temp 98.6 F (37 C) (Oral)   Resp (!) 23   Ht 5\' 3"  (1.6 m)   Wt (!) 154.7 kg   SpO2 93%   BMI 60.41 kg/m  Physical Exam General: Sleeping Cardiac: Extremities well-perfused Lungs: Breathing is unlabored Psych: Deferred  ED Course / MDM  EKG:EKG Interpretation Date/Time:  Tuesday July 20 2023 14:10:16 EST Ventricular Rate:  104 PR Interval:  123 QRS Duration:  89 QT Interval:  341 QTC Calculation: 449 R Axis:   18  Text Interpretation: Sinus tachycardia Confirmed by Eber Hong ([id number]) on 07/20/2023 2:14:18 PM  I have reviewed the labs performed to date as well as medications administered while in observation.  Recent changes in the last 24 hours include none.  Plan  Current plan is for placement.    Gloris Manchester, MD 07/21/23 [phone]

## 2023-07-22 NOTE — ED Provider Notes (Signed)
Emergency Medicine Observation Re-evaluation Note  Megan Manning is a 42 y.o. female, seen on rounds today.  Pt initially presented to the ED for complaints of neuropathy pain Currently, the patient is awaiting disposition residential group home/[facility].  Patient does not have any complaints this morning.  Physical Exam  BP 138/73 (BP Location: Right Arm)   Pulse 94   Temp 98.9 F (37.2 C) (Oral)   Resp 18   Ht 5\' 3"  (1.6 m)   Wt (!) 154.7 kg   SpO2 93%   BMI 60.41 kg/m  Physical Exam General: Alert nontoxic no acute distress Cardiac: Regular Lungs: Clear to auscultation Psych: Awake and cooperative.  Calm. Musculoskeletal: Lower extremities have obesity but no edema or calf tenderness.  ED Course / MDM  EKG:EKG Interpretation Date/Time:  Tuesday July 20 2023 14:10:16 EST Ventricular Rate:  104 PR Interval:  123 QRS Duration:  89 QT Interval:  341 QTC Calculation: 449 R Axis:   18  Text Interpretation: Sinus tachycardia Confirmed by Eber Hong ([id number]) on 07/20/2023 2:14:18 PM  I have reviewed the labs performed to date as well as medications administered while in observation.  Recent changes in the last 24 hours include none.  Plan  Current plan is for social work to find living arrangement.  Patient has legal guardian and residential provider but at this time no immediate accepting placement.    Arby Barrette, MD 07/22/23 [phone]

## 2023-07-22 NOTE — ED Notes (Signed)
Pt reporting milk coming out of her breasts and bruises on her legs. Pt has neither breast milk nor bruises to legs. Pt also reported panic attack. Coached pt through deep breathing. Pt was in NAD per assessment.

## 2023-07-23 LAB — URINALYSIS, ROUTINE W REFLEX MICROSCOPIC
Bilirubin Urine: NEGATIVE
Glucose, UA: NEGATIVE mg/dL
Hgb urine dipstick: NEGATIVE
Ketones, ur: NEGATIVE mg/dL
Nitrite: NEGATIVE
Protein, ur: NEGATIVE mg/dL
Specific Gravity, Urine: 1.025 (ref 1.005–1.030)
pH: 6 (ref 5.0–8.0)

## 2023-07-23 MED ORDER — POLYETHYLENE GLYCOL 3350 17 G PO PACK
17.0000 g | PACK | Freq: Every day | ORAL | Status: DC | PRN
  Filled 2023-07-23 (×2): qty 1

## 2023-07-23 NOTE — ED Notes (Addendum)
Pt has voiced several medical complaints in last approx. 30 minutes. Pt has requested this nurse to the bedside stating she is short of breath but in NAD at this time, respirations even and unlabored. Pt then called nurse back to room states she is having chest pain that is pins and needles to her entire anterior chest, but denies any other s/s at this time. Pt just finished eating a sandwich pack approx. 15 minutes ago as she did not eat her dinner tray tonight, and ate 100% of the sandwich pack and drank approx. . VS obtained and EDP updated. Pt remains to be in NAD and appears comfortable while resting in her bed at this time.

## 2023-07-23 NOTE — ED Provider Notes (Signed)
Emergency Medicine Observation Re-evaluation Note  Megan Manning is a 42 y.o. female, seen on rounds today.  Pt initially presented to the ED for complaints of neuropathy pain Currently, the patient is awake in bed. States she's had some generalized abdominal pain. Has not had a BM in 2 days.  Physical Exam  BP 110/78   Pulse 81   Temp 98.8 F (37.1 C)   Resp 20   Ht 5\' 3"  (1.6 m)   Wt (!) 154.7 kg   SpO2 91%   BMI 60.41 kg/m  Physical Exam General: Awake and alert, no acute distress Cardiac: Regular rate Lungs: No increased WOB Psych: Calm, cooperative Abd: Soft, non-tender  ED Course / MDM  EKG:EKG Interpretation Date/Time:  Wednesday July 21 2023 17:41:40 EST Ventricular Rate:  94 PR Interval:  114 QRS Duration:  82 QT Interval:  342 QTC Calculation: 427 R Axis:   8  Text Interpretation: Normal sinus rhythm Nonspecific T wave abnormality No significant change since last tracing No previous ECGs available Confirmed by Gwyneth Sprout ([id number]) on 07/22/2023 5:54:28 PM  I have reviewed the labs performed to date as well as medications administered while in observation.  Recent changes in the last 24 hours include patient reports abdominal pain, no tenderness on exam. Suspect constipation and miralax ordered.  Plan  Current plan is for Pending placement.    Rexford Maus, DO 07/23/23 [phone]

## 2023-07-23 NOTE — ED Notes (Signed)
Pt c/o burning w/ urination. Notified EDP

## 2023-07-24 MED ORDER — HALOPERIDOL LACTATE 5 MG/ML IJ SOLN
5.0000 mg | Freq: Once | INTRAMUSCULAR | Status: AC
  Administered 2023-07-24: 5 mg via INTRAMUSCULAR
  Filled 2023-07-24: qty 1

## 2023-07-24 MED ORDER — LORAZEPAM 2 MG/ML IJ SOLN
2.0000 mg | Freq: Once | INTRAMUSCULAR | Status: AC
  Administered 2023-07-24: 2 mg via INTRAMUSCULAR
  Filled 2023-07-24: qty 1

## 2023-07-24 MED ORDER — IBUPROFEN 400 MG PO TABS
600.0000 mg | ORAL_TABLET | Freq: Once | ORAL | Status: AC
  Administered 2023-07-24: 600 mg via ORAL
  Filled 2023-07-24: qty 1

## 2023-07-24 MED ORDER — DIPHENHYDRAMINE HCL 50 MG/ML IJ SOLN
50.0000 mg | Freq: Once | INTRAMUSCULAR | Status: AC
  Administered 2023-07-24: 50 mg via INTRAMUSCULAR
  Filled 2023-07-24: qty 1

## 2023-07-24 MED ORDER — ONDANSETRON 4 MG PO TBDP
4.0000 mg | ORAL_TABLET | Freq: Three times a day (TID) | ORAL | Status: DC | PRN
  Administered 2023-07-25 – 2023-10-01 (×15): 4 mg via ORAL
  Filled 2023-07-24 (×17): qty 1

## 2023-07-24 NOTE — ED Notes (Signed)
This RN cleaned the patient up, she vomited on her shirt. Patient states she swallowed her plastic name bracelet & asking for a provider to check her out. MD Silverio Lay notified - requesting to allow the medication to kick in & a provider will be by later to check on her.

## 2023-07-24 NOTE — ED Notes (Addendum)
Patient called out asking if she could swallow the cap to her water bottle. She was instructed no & removed the bottle and cap from the room. The patient then informed this RN that she was chewing on a piece of styrofoam cup & refusing to spit out the piece that was still in her mouth.   MD Silverio Lay notified that patient has swallowed part of her styrofoam cup & refusing to spit out the other piece.  MD verbalized she can have her PRN Valium early; no other orders at this time.

## 2023-07-24 NOTE — ED Notes (Signed)
Patient reported having shortness of breath saturation was assess and she was 98% on roomair.
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
## 2023-07-24 NOTE — ED Notes (Signed)
Patient respirations are unlabored, equal rise & fall. Patient looking around the room, quietly laying in bed. There has been no other vomiting so far.

## 2023-07-24 NOTE — Progress Notes (Signed)
 There are no discharge planning updates available at this time.  Edwin Dada, MSW, LCSW Transitions of Care  Clinical Social Worker II [phone]

## 2023-07-24 NOTE — ED Notes (Signed)
MD Yao at the bedside.  

## 2023-07-24 NOTE — ED Provider Notes (Signed)
Emergency Medicine Observation Re-evaluation Note  Megan Manning is a 42 y.o. female, seen on rounds today.  Pt initially presented to the ED for complaints of neuropathy pain Currently, the patient is resting comfortably in bed.  No acute concerns  Physical Exam  BP 122/83 (BP Location: Right Arm)   Pulse 75   Temp 98.2 F (36.8 C) (Oral)   Resp 20   Ht 5\' 3"  (1.6 m)   Wt (!) 154.7 kg   SpO2 94%   BMI 60.41 kg/m  Physical Exam General: Awake and alert Cardiac: Regular rate Lungs: Normal respiratory effort Psych: Calm, cooperative  ED Course / MDM  EKG:EKG Interpretation Date/Time:  Wednesday July 21 2023 17:41:40 EST Ventricular Rate:  94 PR Interval:  114 QRS Duration:  82 QT Interval:  342 QTC Calculation: 427 R Axis:   8  Text Interpretation: Normal sinus rhythm Nonspecific T wave abnormality No significant change since last tracing No previous ECGs available Confirmed by Gwyneth Sprout ([id number]) on 07/22/2023 5:54:28 PM  I have reviewed the labs performed to date as well as medications administered while in observation.  Recent changes in the last 24 hours include no changes.  Plan  Current plan is for placement.    Laurence Spates, MD 07/24/23 [phone]
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
## 2023-07-25 NOTE — ED Notes (Signed)
Patient resting in bed; lights dimmed.

## 2023-07-25 NOTE — ED Notes (Addendum)
Patient called out asking for pain medicine for her stomach; refused the Tylenol ordered PRN.  MD Bero notified; no new orders at this time.

## 2023-07-25 NOTE — ED Notes (Signed)
Assisted patient to bathroom and changed sheets/bedding.

## 2023-07-25 NOTE — ED Notes (Signed)
Pt in shower at this time

## 2023-07-25 NOTE — ED Provider Notes (Signed)
Emergency Medicine Observation Re-evaluation Note  Megan Manning is a 42 y.o. female, seen on rounds today.  Pt initially presented to the ED for complaints of neuropathy pain Currently, the patient is resting comfortably in bed.  Physical Exam  BP 120/73   Pulse 90   Temp 98.2 F (36.8 C) (Oral)   Resp 16   Ht 5\' 3"  (1.6 m)   Wt (!) 154.7 kg   SpO2 95%   BMI 60.41 kg/m  Physical Exam General: Resting comfortably Cardiac: Regular rate Lungs: Normal respiratory effort Psych: Calm  ED Course / MDM  EKG:EKG Interpretation Date/Time:  Friday July 23 2023 22:33:49 EST Ventricular Rate:  84 PR Interval:  132 QRS Duration:  70 QT Interval:  356 QTC Calculation: 420 R Axis:   0  Text Interpretation: Normal sinus rhythm Normal ECG No previous ECGs available Confirmed by Ross Marcus ([id number]) on 07/24/2023 3:01:23 PM  I have reviewed the labs performed to date as well as medications administered while in observation.  Recent changes in the last 24 hours include required agitation treatment yesterday.  Plan  Current plan is for placement.    Laurence Spates, MD 07/25/23 [phone]

## 2023-07-25 NOTE — ED Notes (Signed)
Took a shower and cleaned herself up all by herself and changed her gowns, only needed help putting on some socks

## 2023-07-25 NOTE — ED Notes (Signed)
Report received, care of pt assumed.  Pt resting in bed, eyes closed, resp even and non labored.  Will monitor.

## 2023-07-25 NOTE — ED Notes (Addendum)
Patient has become upset for no reason. Patient is banging her head against the bed frame and I asked her repeatedly to stop. Patient then sat on the floor, back against the wall and started banging her head on the wall. Had police officer help me get her off the floor and back into bed. I asked patient why she's upset and also asked what I could do to help her and she remains mute. Patient remained in bed for a whole 2 minutes before making her way back down to the floor, sitting upright position, back against the wall.

## 2023-07-26 MED ORDER — ZIPRASIDONE MESYLATE 20 MG IM SOLR
20.0000 mg | Freq: Once | INTRAMUSCULAR | Status: AC
  Administered 2023-07-26: 20 mg via INTRAMUSCULAR
  Filled 2023-07-26: qty 20

## 2023-07-26 MED ORDER — HALOPERIDOL 5 MG PO TABS
5.0000 mg | ORAL_TABLET | Freq: Once | ORAL | Status: AC
  Administered 2023-07-26: 5 mg via ORAL
  Filled 2023-07-26: qty 1

## 2023-07-26 MED ORDER — LORAZEPAM 1 MG PO TABS
1.0000 mg | ORAL_TABLET | Freq: Once | ORAL | Status: AC
  Administered 2023-07-26: 1 mg via ORAL
  Filled 2023-07-26: qty 1

## 2023-07-26 NOTE — ED Notes (Signed)
Graham crackers and peanut butter given for snack.
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
## 2023-07-26 NOTE — ED Notes (Addendum)
Pt asking sitter for help w/ the bedpan. Explained to pt that she is more than capable to get up out of bed by herself and walk to the bathroom by herself and that she needs to maintain independency in ADLs. Pt denies need of anything at this time.

## 2023-07-26 NOTE — ED Notes (Signed)
Pt refusing to eat breakfast provided. Pt told sitter she didn't want to eat her breakfast because her stomach was hurting, but pt then asked for the graham crackers and peanut butter. Explained to pt because it is snack time, those food items will be provided to pt.

## 2023-07-26 NOTE — ED Provider Notes (Addendum)
I was called to see pt because she may have eaten a glove. Pt's nurse said she would give her some pb crackers if she took her meds.  She would not take her meds, so she did not get the crackers.  The nurse turned to give meds to another patient and pt got up and went to the gloves.  She told the nurse she ate a glove.  The nurse checked her shortly after this and there was no evidence of any glove in her mouth.  Pt is in no distress.  She has a hx of doing these manipulative behaviors.  She has been psych cleared already and is waiting for group home placement.  Pt is stable.   Jacalyn Lefevre, MD 07/26/23 2148  Pt's behaviors have intensified.  She ripped off a piece of her gown and ate it.  She is getting more and more agitated and is threatening toward the nurses and sitters.  Geodon ordered.  IVC redone.  Psych will be re-consulted.    Jacalyn Lefevre, MD 07/26/23 2230

## 2023-07-26 NOTE — ED Notes (Signed)
IVC paperwork complete and in purple zone, expires 08/02/23, case # [id number]

## 2023-07-26 NOTE — ED Notes (Signed)
Pt refusing meds. Explained that she needs the meds and they are the same meds that she has been receiving since she got her. Explained that refusing meds will delay her getting out of here. Pt continued to refuse meds and said "I'll be alright". Pt reported the meds aren't working.

## 2023-07-26 NOTE — ED Notes (Signed)
Patient has been eating random things found in her room. She has been hiding things in her bed. She's ate a glove that she grabbed from the sitters computer,  pieces of a styrofoam cup when we gave her water for her meds, pieces of paper from the restroom, padding from inside of the seizure pads we placed on her bed rails because she was hitting her head against it. We Have removed all items from her room, very close 1:1 sitter observation and even then, she'll pretend to go to the restroom and instead lunge at the garbage can to try and eat stuff from there.

## 2023-07-26 NOTE — ED Provider Notes (Addendum)
Emergency Medicine Observation Re-evaluation Note  Megan Manning is a 42 y.o. female, seen on rounds today.  Pt initially presented to the ED for complaints of neuropathy pain Currently, the patient is sleeping.  Physical Exam  BP 112/63 (BP Location: Right Arm)   Pulse (!) 109   Temp 98.5 F (36.9 C) (Oral)   Resp 16   Ht 5\' 3"  (1.6 m)   Wt (!) 154.7 kg   SpO2 93%   BMI 60.41 kg/m  Physical Exam General: No acute distress Cardiac: Normal rate Lungs: No increased work of breathing Psych: Calm  ED Course / MDM  EKG:EKG Interpretation Date/Time:  Friday July 23 2023 22:33:49 EST Ventricular Rate:  84 PR Interval:  132 QRS Duration:  70 QT Interval:  356 QTC Calculation: 420 R Axis:   0  Text Interpretation: Normal sinus rhythm Normal ECG No previous ECGs available Confirmed by Ross Marcus ([id number]) on 07/24/2023 3:01:23 PM  I have reviewed the labs performed to date as well as medications administered while in observation.  Recent changes in the last 24 hours include none.  She reportedly continues to eat foreign objects  Plan  Current plan placement in new group home.    Rolan Bucco, MD 07/26/23 6213    Rolan Bucco, MD 07/26/23 [phone]

## 2023-07-26 NOTE — Progress Notes (Signed)
CSW received email from Rennie Natter, [facility] supervisor who states she has sent out referrals for residential placement and patient is being considered by 3 of the agencies at this time. Mary to provide CSW with updates as new information becomes available.  Edwin Dada, MSW, LCSW Transitions of Care  Clinical Social Worker II [phone]

## 2023-07-26 NOTE — ED Notes (Signed)
Patient is trying to make herself puke the meds I just gave her by sticking her fingers down her throat.

## 2023-07-27 ENCOUNTER — Emergency Department (HOSPITAL_COMMUNITY): Payer: MEDICAID

## 2023-07-27 LAB — CBC WITH DIFFERENTIAL/PLATELET
Abs Immature Granulocytes: 0.02 10*3/uL (ref 0.00–0.07)
Basophils Absolute: 0 10*3/uL (ref 0.0–0.1)
Basophils Relative: 1 %
Eosinophils Absolute: 0.2 10*3/uL (ref 0.0–0.5)
Eosinophils Relative: 3 %
HCT: 39.4 % (ref 36.0–46.0)
Hemoglobin: 12.2 g/dL (ref 12.0–15.0)
Immature Granulocytes: 0 %
Lymphocytes Relative: 40 %
Lymphs Abs: 2.5 10*3/uL (ref 0.7–4.0)
MCH: 27.4 pg (ref 26.0–34.0)
MCHC: 31 g/dL (ref 30.0–36.0)
MCV: 88.3 fL (ref 80.0–100.0)
Monocytes Absolute: 0.4 10*3/uL (ref 0.1–1.0)
Monocytes Relative: 7 %
Neutro Abs: 3 10*3/uL (ref 1.7–7.7)
Neutrophils Relative %: 49 %
Platelets: 264 10*3/uL (ref 150–400)
RBC: 4.46 MIL/uL (ref 3.87–5.11)
RDW: 12.5 % (ref 11.5–15.5)
WBC: 6.2 10*3/uL (ref 4.0–10.5)
nRBC: 0 % (ref 0.0–0.2)

## 2023-07-27 LAB — URINALYSIS, W/ REFLEX TO CULTURE (INFECTION SUSPECTED)
Bilirubin Urine: NEGATIVE
Glucose, UA: NEGATIVE mg/dL
Hgb urine dipstick: NEGATIVE
Ketones, ur: NEGATIVE mg/dL
Nitrite: NEGATIVE
Protein, ur: 30 mg/dL — AB
Specific Gravity, Urine: 1.03 (ref 1.005–1.030)
pH: 7 (ref 5.0–8.0)

## 2023-07-27 LAB — COMPREHENSIVE METABOLIC PANEL
ALT: 14 U/L (ref 0–44)
AST: 26 U/L (ref 15–41)
Albumin: 3.9 g/dL (ref 3.5–5.0)
Alkaline Phosphatase: 60 U/L (ref 38–126)
Anion gap: 12 (ref 5–15)
BUN: 10 mg/dL (ref 6–20)
CO2: 23 mmol/L (ref 22–32)
Calcium: 9 mg/dL (ref 8.9–10.3)
Chloride: 103 mmol/L (ref 98–111)
Creatinine, Ser: 0.71 mg/dL (ref 0.44–1.00)
GFR, Estimated: >60 mL/min (ref >60)
Glucose, Bld: 99 mg/dL (ref 70–99)
Potassium: 4 mmol/L (ref 3.5–5.1)
Sodium: 138 mmol/L (ref 135–145)
Total Bilirubin: 0.5 mg/dL (ref 0.0–1.2)
Total Protein: 7.1 g/dL (ref 6.5–8.1)

## 2023-07-27 LAB — D-DIMER, QUANTITATIVE: D-Dimer, Quant: 0.49 ug{FEU}/mL (ref 0.00–0.50)

## 2023-07-27 LAB — TROPONIN I (HIGH SENSITIVITY)
Troponin I (High Sensitivity): 5 ng/L (ref <18)
Troponin I (High Sensitivity): 5 ng/L (ref <18)

## 2023-07-27 LAB — MAGNESIUM: Magnesium: 2.3 mg/dL (ref 1.7–2.4)

## 2023-07-27 LAB — RESP PANEL BY RT-PCR (RSV, FLU A&B, COVID)  RVPGX2
Influenza A by PCR: NEGATIVE
Influenza B by PCR: NEGATIVE
Resp Syncytial Virus by PCR: NEGATIVE
SARS Coronavirus 2 by RT PCR: NEGATIVE

## 2023-07-27 LAB — LIPASE, BLOOD: Lipase: 42 U/L (ref 11–51)

## 2023-07-27 LAB — HCG, SERUM, QUALITATIVE: Preg, Serum: NEGATIVE

## 2023-07-27 MED ORDER — LORAZEPAM 2 MG/ML IJ SOLN
1.0000 mg | Freq: Once | INTRAMUSCULAR | Status: AC
Start: 2023-07-27 — End: 2023-07-27
  Administered 2023-07-27: 1 mg via INTRAVENOUS
  Filled 2023-07-27: qty 1

## 2023-07-27 MED ORDER — HALOPERIDOL LACTATE 5 MG/ML IJ SOLN
3.0000 mg | Freq: Once | INTRAMUSCULAR | Status: AC
Start: 2023-07-27 — End: 2023-07-27
  Administered 2023-07-27: 3 mg via INTRAVENOUS
  Filled 2023-07-27: qty 1

## 2023-07-27 MED ORDER — LACTATED RINGERS IV BOLUS
500.0000 mL | Freq: Once | INTRAVENOUS | Status: AC
Start: 2023-07-27 — End: 2023-07-27
  Administered 2023-07-27: 500 mL via INTRAVENOUS

## 2023-07-27 MED ORDER — METOPROLOL TARTRATE 5 MG/5ML IV SOLN
5.0000 mg | Freq: Once | INTRAVENOUS | Status: AC
  Administered 2023-07-27: 5 mg via INTRAVENOUS
  Filled 2023-07-27: qty 5
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
## 2023-07-27 NOTE — ED Provider Notes (Signed)
 Emergency Medicine Observation Re-evaluation Note  Megan Manning is a 42 y.o. female, seen on rounds today.  Pt initially presented to the ED for complaints of neuropathy pain She had presented, initially had been medically and psychiatrically cleared and was awaiting group home placement, however yesterday she became more agitated and threatening, was given Geodon  and IVC redone, and psychiatry was reconsulted.  Currently awaiting their recommendations.  Today, she was found to be tachycardic to 131, is reporting chest and abdominal pain.  Per staff and patient the chest and abdominal pain has been ongoing since she presented to the ED.  CP reports is sharp needle like, worse with deep breaths.  Has associated dyspnea.  Abdominal pain is epigastric, worse after eating.  No nausea, vomiting, diarrhea. Did have constipation. Does have pain with urination.  Per staff today she is also refusing to take her medications. Physical Exam  BP 109/71 (BP Location: Left Wrist)   Pulse (!) 121   Temp 98.6 F (37 C) (Oral)   Resp 18   Ht 5' 3 (1.6 m)   Wt (!) 154.7 kg   SpO2 95%   BMI 60.41 kg/m  Physical Exam General: NAD Cardiac: tachycardia, symmetric upper and lower extremity pulses Lungs: even unlabored Abd: tenderness epigastrium, RUQ   ED Course / MDM  EKG:EKG Interpretation Date/Time:  Friday July 23 2023 22:33:49 EST Ventricular Rate:  84 PR Interval:  132 QRS Duration:  70 QT Interval:  356 QTC Calculation: 420 R Axis:   0  Text Interpretation: Normal sinus rhythm Normal ECG No previous ECGs available Confirmed by Bari Pfeiffer ([id number]) on 07/24/2023 3:01:23 PM  I have reviewed the labs performed to date as well as medications administered while in observation.  Recent changes in the last 24 hours include see above.  Plan   At this time she is reporting chest pain and abdominal pain that have been ongoing, however has new sinus tachycardia.  Differential diagnosis  includes infection, pulmonary embolus, dehydration, not taking her medications.  As her symptoms have been going on prior to ingestion of foreign body yesterday I have low suspicion this is related.  Given ongoing epigastric and right upper quadrant pain after eating, ordered right upper heart ultrasound, CMP, lipase.  Given chest pain, dyspnea and tachycardia, ordered EKG, troponin and D-dimer.  EKG was completed and personally about interpreted by me shows a sinus tachycardia.  Labs were performed which showed a negative D-dimer, low suspicion for PE, no anemia, no leukocytosis, no clinically significant electrolyte abnormalities, normal lipase, no transaminitis, normal troponin low suspicion for ACS, negative pregnancy test. urinalysis without signs of infection.  COVID influenza and RSV testing are negative.  Chest x-ray without significant abnormalities.  Right upper quadrant ultrasound without gallstones or ductal dilation.  Low clinical suspicion for other acute surgical pathology at this time.    Tachycardia may be secondary to dehydration, and patient refusing to take her medications.  Ordered Ativan , IV metoprolol , encouraged her to take her p.o. metoprolol .   She is awaiting TTS drucie Dreama Longs, MD 07/27/23 1715

## 2023-07-27 NOTE — ED Notes (Signed)
 Patient refused her morning medication. Dr Roland Earl was made aware.

## 2023-07-27 NOTE — ED Notes (Signed)
Patient heart rate at 0900 was 130, Dr Roland Earl was made aware. She c/o chest pain, and RUQ pain. Orders were given for EKG, labs, urine 1 view chest xray and ultrasound of the RUQ. Patient was given LR 500 cc bolus.

## 2023-07-27 NOTE — ED Notes (Signed)
 Patient first Troponin was negative. Per Dr Roland Earl do not need a second Troponin.

## 2023-07-27 NOTE — ED Notes (Signed)
 Patient refuse her morning medications. Per patient it does not help.

## 2023-07-27 NOTE — ED Notes (Signed)
TTS in process 

## 2023-07-27 NOTE — ED Notes (Signed)
Patient was given Metoprolol 5 MG times one  at 1705 for heart rate 121.

## 2023-07-27 NOTE — ED Notes (Signed)
Patient upset and refusing to take meds; pt continues to c/o of chest pain but refuses protonix; RN educated patient on what the meds are for; Pt is tearful and c/o not being heard; Pt states she does not needs need any meds-[name],RN

## 2023-07-28 LAB — CBG MONITORING, ED: Glucose-Capillary: 96 mg/dL (ref 70–99)

## 2023-07-28 NOTE — ED Provider Notes (Signed)
 Emergency Medicine Observation Re-evaluation Note  Megan Manning is a 43 y.o. female, seen on rounds today.  Pt initially presented to the ED for complaints of neuropathy pain Currently, the patient is resting   Physical Exam  BP 139/85 (BP Location: Left Arm)   Pulse (!) 106   Temp 99.3 F (37.4 C) (Oral)   Resp 18   Ht 5' 3 (1.6 m)   Wt (!) 154.7 kg   SpO2 96%   BMI 60.41 kg/m  Physical Exam General: nad  Cardiac: tachy Lungs: non-labored  Psych: oppositional   ED Course / MDM  EKG:EKG Interpretation Date/Time:  Tuesday July 27 2023 09:24:26 EST Ventricular Rate:  125 PR Interval:  112 QRS Duration:  66 QT Interval:  314 QTC Calculation: 453 R Axis:   -1  Text Interpretation: Sinus tachycardia Confirmed by Ruthe Cornet [phone]) on 07/27/2023 4:56:01 PM  I have reviewed the labs performed to date as well as medications administered while in observation.  Recent changes in the last 24 hours include none   Plan  HR improved this morning compared to yesterday. Re-evaled by psych; cleared to group home. Awaiting placement.     Neysa Caron PARAS, DO 07/28/23 1730

## 2023-07-28 NOTE — ED Notes (Signed)
 Pt refused all meds despite this RN educating pt on the importance of each medication and what they are for.

## 2023-07-28 NOTE — ED Notes (Signed)
 IVC'd 07/26/23, exp 08/02/23

## 2023-07-28 NOTE — ED Notes (Signed)
 Per Rhae Hammock MD, push PO fluids for HR.

## 2023-07-28 NOTE — ED Notes (Signed)
 Patient requesting food at this time. Trinna Post, RN informed patient it is past snack time.

## 2023-07-28 NOTE — Consult Note (Addendum)
 [name] is a 43 year old African-American female with a past psychiatric history significant for borderline personality disorder, intellectual disability, PICA, and adjustment disorder, who currently resides in the [facility] emergency department as a border while awaiting group home placement, due to losing previous living arrangements in an AFL from unsafe and unmanageable behaviors, resulting in the need for a higher level of care.   Patient originally seen at the [facility] 07/15/23 by Dr. Homer, MD for the initial chief complaint of ingesting foreign objects after becoming upset at her ALF, and upon completion of evaluation, patient was sent to the [facility] emergency department for emergency medical clearance, due to endorsements of ingestion of foreign body. Patient was then medically cleared and sent back to the [facility], where she was seen by Mr. Gaither Pouch and psychiatrically cleared for return back to AFL, who after returning back to her AFL endorsed ingestion of swallowing bracelets, which led to returning back to the [facility] briefly, before being directed back again to the [facility] emergency department for medical clearance, due to swallowing foreign object. Psychiatry was then consulted at the [facility] emergency department.   While at [facility] ED for psychiatric consultation after medical clearance, patient was seen by [name] Dr. Delene on 07/16/23 for the initial chief complaint of ingesting foreign objects after becoming upset at her ALF, and Dr. Delene psychiatrically cleared the patient, on the grounds of the patient does not meet inpatient psychiatric criteria and/or holding criteria (I.e., history of IDD, expected behavioral disturbances as a part of the patient's chronic illness course of IDD).   Psychiatry was then shortly reconsulted due to a recrudescence of intermittent periods of increased agitations and continued chronic behavior of attempting to try to swallow  foreign objects such as EKG wires and Styrofoam cups, who upon reconsultation with Ms. Nash, NP, was ultimately repsychiatrically cleared (endorsed reconsultation not needed or warranted), due to largely the same conclusion, that the patient's behaviors and incidence of behavioral disturbances are not expected to change, as they are expected as a part of the patient's chronic illness course of IDD, and that the recommendations would be for tighter and more strict adherence to safety measures, while awaiting return to AFL.  Patient unfortunately later that day lost AFL placement, and was subsequently transition to boarder status while awaiting new placement disposition.  TOC then consulted.  To date, psychiatry has now been reconsulted 07/27/23 for increased behavioral disturbances, continued attempts to swallow foreign objects, and now refusal to take psychiatric medications scheduled. CCA performed by psychiatry team, and upon evaluation, patient continues to endorse no SI/HI/AVH.   Discussed with Dr. Neysa from primary EDP team that the recommendations remain the same, strict adherence to safety measures need to be implemented listed below.  Patient continues to not meet inpatient criteria at this time.  There are no medication changes that are felt the patient would benefit from at this time.  Spoke with Dr. Larina who agrees with repsychiatric clearance, as well as the additional recommendations listed below.  Recommendations- Cleared psychiatrically for boarding status  -Recommend finger foods only diet -Recommend strict one-to-one safety sitter, to be at arms length at all times -Recommend mouth checks for medication administration -Recommend safe environment with strict precautions -Recommend inventory of supplies utilized during patient encounters

## 2023-07-28 NOTE — ED Notes (Signed)
 Pt given dinner tray but refusing all food and drink at this time. Will continue to push fluids and stress importance of hydration given elevated BP and HR after refusing meds

## 2023-07-28 NOTE — ED Notes (Signed)
 EDP notified of RR and HR

## 2023-07-28 NOTE — ED Notes (Signed)
 Pt given breakfast with all packaging and utensils removed for safety given hx of eating paper and plastic items. Pt had requested food and drink several times but immediately refused when brought to room with packaging removed. Pt also stated she plans to refuse all medications today. Otherwise pt's behavior is calm and appropriate so far this morning. This NT checked pt's room for any hazardous items, nothing found, left pt with plastic water  bottle with cap removed.

## 2023-07-28 NOTE — ED Notes (Signed)
 Patient is asking for medication to help her sleep and asked for her Seroquel she refused earlier -[facility]

## 2023-07-28 NOTE — ED Notes (Signed)
 Pt tachycardic. Pt refused all meds this morning. Pt still refusing meds. Notified EDP.

## 2023-07-28 NOTE — ED Notes (Signed)
 Pt refused any food or drink for lunch. CBG normal, pt denies any discomfort or needs at this time.

## 2023-07-28 NOTE — ED Notes (Addendum)
 Pt given graham crackers, peanut butter and soda for snack

## 2023-07-29 NOTE — ED Notes (Signed)
 Per Dr Silverio Lay, pt is tachycardic, continue to monitor for worsening symptoms.

## 2023-07-29 NOTE — ED Notes (Signed)
 IVC current exp 1/6

## 2023-07-29 NOTE — ED Notes (Signed)
 Pt c/o chest pain(sharp in nature) and shortness of breath. Dr Silverio Lay aware, EKG ordered and obtained.
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
## 2023-07-29 NOTE — ED Provider Notes (Signed)
 Emergency Medicine Observation Re-evaluation Note  Megan Manning is a 43 y.o. female, seen on rounds today.  Pt initially presented to the ED for complaints of neuropathy pain Currently, the patient is awake no distress.  Physical Exam  BP 131/85 (BP Location: Right Arm)   Pulse (!) 122   Temp 99.2 F (37.3 C) (Oral)   Resp (!) 22   Ht 1.6 m (5' 3)   Wt (!) 154.7 kg   SpO2 91%   BMI 60.41 kg/m  Physical Exam General: Awake alert Cardiac: Tachycardia noted   ED Course / MDM  EKG:EKG Interpretation Date/Time:  Tuesday July 27 2023 09:24:26 EST Ventricular Rate:  125 PR Interval:  112 QRS Duration:  66 QT Interval:  314 QTC Calculation: 453 R Axis:   -1  Text Interpretation: Sinus tachycardia Confirmed by Ruthe Cornet [phone]) on 07/27/2023 4:56:01 PM  I have reviewed the labs performed to date as well as medications administered while in observation.  Recent changes in the last 24 hours include persistent tachycardia noted..  Patient without fever or signs of infection extensive negative workup initially.  Will continue to monitor.  Patient encouraged to take p.o. and her home medications  Plan  Current plan is for placement.    Randol Simmonds, MD 07/29/23 [phone]

## 2023-07-30 LAB — COMPREHENSIVE METABOLIC PANEL
ALT: 15 U/L (ref 0–44)
AST: 21 U/L (ref 15–41)
Albumin: 3.6 g/dL (ref 3.5–5.0)
Alkaline Phosphatase: 65 U/L (ref 38–126)
Anion gap: 9 (ref 5–15)
BUN: 12 mg/dL (ref 6–20)
CO2: 24 mmol/L (ref 22–32)
Calcium: 8.8 mg/dL — ABNORMAL LOW (ref 8.9–10.3)
Chloride: 106 mmol/L (ref 98–111)
Creatinine, Ser: 0.66 mg/dL (ref 0.44–1.00)
GFR, Estimated: >60 mL/min (ref >60)
Glucose, Bld: 131 mg/dL — ABNORMAL HIGH (ref 70–99)
Potassium: 4.1 mmol/L (ref 3.5–5.1)
Sodium: 139 mmol/L (ref 135–145)
Total Bilirubin: 0.4 mg/dL (ref 0.0–1.2)
Total Protein: 6.6 g/dL (ref 6.5–8.1)

## 2023-07-30 LAB — CBC WITH DIFFERENTIAL/PLATELET
Abs Immature Granulocytes: 0.03 10*3/uL (ref 0.00–0.07)
Basophils Absolute: 0.1 10*3/uL (ref 0.0–0.1)
Basophils Relative: 1 %
Eosinophils Absolute: 0.4 10*3/uL (ref 0.0–0.5)
Eosinophils Relative: 5 %
HCT: 40.8 % (ref 36.0–46.0)
Hemoglobin: 11.9 g/dL — ABNORMAL LOW (ref 12.0–15.0)
Immature Granulocytes: 0 %
Lymphocytes Relative: 43 %
Lymphs Abs: 3.1 10*3/uL (ref 0.7–4.0)
MCH: 27.4 pg (ref 26.0–34.0)
MCHC: 29.2 g/dL — ABNORMAL LOW (ref 30.0–36.0)
MCV: 94 fL (ref 80.0–100.0)
Monocytes Absolute: 0.5 10*3/uL (ref 0.1–1.0)
Monocytes Relative: 7 %
Neutro Abs: 3.1 10*3/uL (ref 1.7–7.7)
Neutrophils Relative %: 44 %
Platelets: 222 10*3/uL (ref 150–400)
RBC: 4.34 MIL/uL (ref 3.87–5.11)
RDW: 12.6 % (ref 11.5–15.5)
WBC: 7.1 10*3/uL (ref 4.0–10.5)
nRBC: 0 % (ref 0.0–0.2)

## 2023-07-30 LAB — LIPASE, BLOOD: Lipase: 58 U/L — ABNORMAL HIGH (ref 11–51)

## 2023-07-30 MED ORDER — ZIPRASIDONE MESYLATE 20 MG IM SOLR
20.0000 mg | Freq: Once | INTRAMUSCULAR | Status: AC
  Administered 2023-07-30: 20 mg via INTRAMUSCULAR
  Filled 2023-07-30: qty 20

## 2023-07-30 MED ORDER — STERILE WATER FOR INJECTION IJ SOLN
INTRAMUSCULAR | Status: AC
  Administered 2023-07-30: 1.2 mL
  Filled 2023-07-30: qty 10

## 2023-07-30 MED ORDER — LACTATED RINGERS IV BOLUS: 1000.0000 mL | Freq: Once | INTRAVENOUS | Status: DC

## 2023-07-30 NOTE — ED Notes (Signed)
 Patient is having multiple of the same complaints and asking to speak with someone else about her health issues; Pt has been explained to several times that it is important to take her medication to help manage the sx she is having; Pt advised that EDP is aware of increased HR but pt has not received meds in several days. Pt is viabally upset but remains in the bed and sitter in site of patient; [employer] in unit at this time-[name],RN

## 2023-07-30 NOTE — ED Provider Notes (Signed)
 I was called to the patient's room where she had continued to swallow objects, this time a tag from the bed sheet. She then started banging her head against the floor. In order to prevent harm to patient and staff, IM Geodon  20 mg was ordered.    Odell Balls, PA-C 07/30/23 2121    Pamella Ozell LABOR, DO 08/04/23 1400

## 2023-07-30 NOTE — Progress Notes (Signed)
 CSW has not received any updates from [facility] regarding patient's discharge plan. Per most recent communication with [facility] staff, patient is under review at three residential facilities.  Niels Portugal, MSW, LCSW Transitions of Care  Clinical Social Worker II [phone]

## 2023-07-30 NOTE — ED Notes (Signed)
 ED Provider at bedside.

## 2023-07-30 NOTE — ED Notes (Incomplete)
 RN walked into room several times due to patient leaning head the wall and hand not visible; RN did not see any thing in patient hands nor on the floor in the area patient was leaning; Pt the repostioned her selr

## 2023-07-30 NOTE — ED Notes (Signed)
 RN was looking at patient and saw patient feeling for the bottom of the mattress; RN advised patient that all tags was removed; pt continued to feel around and found an opening th mattress and attempted to pull the foam out the mattress; Pt removed from mattress and EDP notified for new orders; Pt placed in soft wrist restraints for safety on a stretcher; [employer]  and security in unit for support; Charge made aware-[name],RN

## 2023-07-30 NOTE — ED Notes (Addendum)
 Patient asked for water ; Sitter removed plastic wrapper from bottle and top and gave patient bottle of water ; While sitter watching the patient she begin to chock on something; sitter called RN to room and RN noticed something white in the back of throat; RN state bed upright and leaned patient forward; Pt begin to vomit something large; White unknown object removed from the patient's throat. EDP  notified; [employer] and security in unit for support; Pt advised she pulled the hard white piece off of the bed; Hospital bed removed and patient placed on just mattress on the floor for safety; There were not stretchers available in the ED. Pulse remains elevated from patient refusing home med for an extended amount of time; Pt also begin banging her head against the floor-[name],RN

## 2023-07-30 NOTE — ED Provider Notes (Signed)
 Emergency Medicine Observation Re-evaluation Note  Megan Manning is a 43 y.o. female, seen on rounds today.  Pt initially presented to the ED for complaints of neuropathy pain Currently, the patient is resting in bed.  Patient notes she has had a few days of epigastric abdominal pain.  Abdominal exam is benign.  No nausea or vomiting but has had decreased p.o. intake as slightly tachycardic.  Physical Exam  BP 138/60 (BP Location: Right Arm)   Pulse (!) 106   Temp 99.2 F (37.3 C) (Oral)   Resp 19   Ht 5' 3 (1.6 m)   Wt (!) 154.7 kg   SpO2 93%   BMI 60.41 kg/m  Physical Exam General: Resting comfortably Cardiac: Regular rate Lungs: Normal effort Psych: Calm, cooperative No abdominal tenderness  ED Course / MDM  EKG:EKG Interpretation Date/Time:  Tuesday July 27 2023 09:24:26 EST Ventricular Rate:  125 PR Interval:  112 QRS Duration:  66 QT Interval:  314 QTC Calculation: 453 R Axis:   -1  Text Interpretation: Sinus tachycardia Confirmed by Ruthe Cornet [phone]) on 07/27/2023 4:56:01 PM  I have reviewed the labs performed to date as well as medications administered while in observation.  Recent changes in the last 24 hours include belly labs ordered.  Plan  Current plan is for placement.  Patient here notes some epigastric pain and nausea.  She mildly tachycardic and appears slightly dry with decreased p.o. intake.  Will repeat some lab work today, her abdominal exam is benign I do not think she needs emergent imaging.  Already had ultrasound of her gallbladder which was unremarkable.  Labwork reviewed.  CBC, CMP unremarkable.  Lipase is very mildly elevated but not consistent with acute pancreatitis.  She remained stable and is tolerating p.o.    Megan Manning DEL, MD 07/30/23 [phone]

## 2023-07-30 NOTE — ED Notes (Signed)
 Discussed morning medications with pt.  Pt requesting Tylenol for headache, refuses all other medications.  States she does not need them and that she has discussed this with "the doctor many times."

## 2023-07-31 MED ORDER — ZIPRASIDONE MESYLATE 20 MG IM SOLR
20.0000 mg | Freq: Once | INTRAMUSCULAR | Status: AC
  Administered 2023-07-31: 20 mg via INTRAMUSCULAR
  Filled 2023-07-31: qty 20

## 2023-07-31 NOTE — ED Notes (Signed)
 Patient currently in room with sitter at bedside, patient upset and crying, Geodon given at this time.

## 2023-07-31 NOTE — ED Notes (Addendum)
 Dinner tray was brought in and pt w/ a giddy attitude stated, No tray for 49. Pt still refusing to eat, drink and take meds, but was agreeable to take tylenol  earlier. Throughout the day she has asked for graham crackers and drink and then would change her mind.

## 2023-07-31 NOTE — ED Notes (Signed)
 Pt screaming profanities, threatening to spit in this nurse's face, calling this nurse you a god damn cracker, god damn bitch, I fucking hate you! Repeatedly at this nurse. Pt informed that providers are dealing with a level 1 trauma and will be informed of pt demands that she be seen for the same complaints she has voiced numerous times since being here in the hospital. Pt has been offered food, drinks, medication, distraction and redirection however, pt continues to escalate her behavior between shouting and then crying and hyperventilating. Pt informed on proper behavior for this department and that profanity, shouting, being disruptive to the other patients, threatening staff, being destructive are not acceptable behaviors. Pt continues to shout and cry. Pt also verbalizes threats of walking out of the unit after she was informed that she is under IVC and that leaving the department is also not acceptable. Pt also becoming verbally aggressive and paranoid accusing staff that their conversations are all about her. Pt is failing to be redirected despite countless attempts. Provider made aware as well as oncoming RN.
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
## 2023-07-31 NOTE — ED Notes (Signed)
Pt drinking tea.

## 2023-07-31 NOTE — ED Notes (Addendum)
 Pt agitated and threatening to spit in this RN's face. Pt also yelling at this RN. Pt remains in restraints for her safety and safety of others. Pt also refusing anything to eat or drink at this time.

## 2023-07-31 NOTE — ED Provider Notes (Signed)
 Pt acutely agitated, difficult to redirect, multiple violent outbursts. Refusing PO medication. Will give IM medication for pt/staff safety. Security at bedside.    Sloan Leiter, DO 07/31/23 2324

## 2023-07-31 NOTE — ED Notes (Signed)
 IVC'd 07/26/23, expires 08/02/23; IVC docs in purple

## 2023-07-31 NOTE — ED Notes (Signed)
 Pt adamantly refusing to eat all food, but had the sitter heat her dinner tray and initially informed this nurse that she was agreeable to take her night time medications, however, once this nurse had scanned pt night time medications pt adamantly refusing to take any PO medications and pt now refusing any food and drinks as well. Pt states you don't have to be mad at me! Begins crying. Pt informed that no one is mad at her and that once she is ready to help herself then staff can further help her also but at this time options are very limited since the pt is not willing to help herself I.e. refusing all foods and drinks, refusing all medications prescribed, exhibiting harmful behaviors to herself and staff, and being destructive to hospital property. Pt informs this nurse that she doesn't like the food options brought to her for dinner, pt informed that the kitchen is closed and if she informs staff while the kitchen is still open an attempt can be made to an alternative food option. Pt continues crying at this time despite efforts to provide verbal redirection and reassurance.

## 2023-07-31 NOTE — ED Notes (Signed)
Assisted pt w/ bedpan.

## 2023-08-01 LAB — URINALYSIS, W/ REFLEX TO CULTURE (INFECTION SUSPECTED)
Bilirubin Urine: NEGATIVE
Glucose, UA: NEGATIVE mg/dL
Hgb urine dipstick: NEGATIVE
Ketones, ur: 5 mg/dL — AB
Leukocytes,Ua: NEGATIVE
Nitrite: NEGATIVE
Protein, ur: NEGATIVE mg/dL
Specific Gravity, Urine: 1.026 (ref 1.005–1.030)
pH: 5 (ref 5.0–8.0)

## 2023-08-01 MED ORDER — DROPERIDOL 2.5 MG/ML IJ SOLN
5.0000 mg | Freq: Once | INTRAMUSCULAR | Status: AC
  Administered 2023-08-01: 5 mg via INTRAMUSCULAR
  Filled 2023-08-01: qty 2

## 2023-08-01 MED ORDER — DIPHENHYDRAMINE HCL 50 MG/ML IJ SOLN
50.0000 mg | Freq: Once | INTRAMUSCULAR | Status: AC
  Administered 2023-08-01: 50 mg via INTRAMUSCULAR
  Filled 2023-08-01: qty 1

## 2023-08-01 MED ORDER — MIDAZOLAM HCL 2 MG/2ML IJ SOLN
2.0000 mg | Freq: Once | INTRAMUSCULAR | Status: AC
  Administered 2023-08-01: 2 mg via INTRAMUSCULAR
  Filled 2023-08-01: qty 2

## 2023-08-01 MED ORDER — DROPERIDOL 2.5 MG/ML IJ SOLN: 1.2500 mg | Freq: Once | INTRAMUSCULAR | Status: DC

## 2023-08-01 MED ORDER — IBUPROFEN 400 MG PO TABS
400.0000 mg | ORAL_TABLET | Freq: Four times a day (QID) | ORAL | Status: DC | PRN
  Administered 2023-08-01 – 2023-10-09 (×27): 400 mg via ORAL
  Filled 2023-08-01 (×2): qty 1
  Filled 2023-08-01 (×2): qty 2
  Filled 2023-08-01 (×2): qty 1
  Filled 2023-08-01: qty 2
  Filled 2023-08-01 (×18): qty 1
  Filled 2023-08-01: qty 2
  Filled 2023-08-01: qty 1

## 2023-08-01 MED ORDER — LORAZEPAM 2 MG/ML IJ SOLN
2.0000 mg | Freq: Once | INTRAMUSCULAR | Status: AC
  Administered 2023-08-01: 2 mg via INTRAMUSCULAR
  Filled 2023-08-01: qty 1

## 2023-08-01 MED ORDER — DROPERIDOL 2.5 MG/ML IJ SOLN
2.5000 mg | Freq: Once | INTRAMUSCULAR | Status: AC
  Administered 2023-08-01: 2.5 mg via INTRAMUSCULAR
  Filled 2023-08-01: qty 2

## 2023-08-01 NOTE — ED Notes (Signed)
 Pt seen/examined at the bedside by Dr Rush Landmark Order given for Violent restraints. Pt verbally abusive, spitting on staff.

## 2023-08-01 NOTE — ED Notes (Addendum)
 Pt w/ multiple psychosomatic complaints as attention-seeking behaviors consistent w/ pt's borderline personality dx. Pt c/o chronic leg pain, knee pain/popping w/ walking, sore throat, pt also voluntarily tachypneic while assessing HR and O2.

## 2023-08-01 NOTE — ED Notes (Signed)
 Pt had total of 4 packs of graham crackers and peanut butter. Pt refusing [place] sandwich at this time which was offered d/t dinner tray not having arrived yet.

## 2023-08-01 NOTE — ED Notes (Signed)
Pt asking for more graham crackers.

## 2023-08-01 NOTE — Progress Notes (Signed)
 There are no discharge planning updates available at this time.  Edwin Dada, MSW, LCSW Transitions of Care  Clinical Social Worker II [phone]

## 2023-08-01 NOTE — ED Provider Notes (Signed)
 I was asked to come see patient because she was reportedly eating more items in her room including part of a Styrofoam cup, a thread from her outfit, and a sticker on the wall.  She is currently not reporting abdominal pain or other symptoms aside from chronic leg pains.  Due to her continued agitation and putting her self at risk for harm, she will be placed into bilateral wrist restraints while the medication that was ordered as needed was given.  Patient's abdomen was nontender and she had good bowel sounds.   Legrand Lasser, Lonni PARAS, MD 08/01/23 [phone]

## 2023-08-01 NOTE — ED Notes (Addendum)
 Patient getting agitated, stating that this RN and sitter is "ignoring" the patient. Stated that she is not feeling well and we are not doing anything for her, EDP notified

## 2023-08-01 NOTE — ED Notes (Signed)
 IVC documents are current in the purple zone.   IVC documents expire 08/02/2023.

## 2023-08-01 NOTE — ED Notes (Signed)
 Pt currently agrees to not try to eat non-food objects today and to not have behaviors.

## 2023-08-01 NOTE — ED Provider Notes (Signed)
 Emergency Medicine Observation Re-evaluation Note  Megan Manning is a 43 y.o. female, seen on rounds today.  Pt initially presented to the ED for complaints of neuropathy pain Currently, the patient is in our purple zone, resting at this time.SABRA  Physical Exam  BP 129/61 (BP Location: Left Arm)   Pulse 90   Temp 98.9 F (37.2 C) (Oral)   Resp 16   Ht 5' 3 (1.6 m)   Wt (!) 154.7 kg   SpO2 94%   BMI 60.41 kg/m  Physical Exam General: Sleeping, no acute distress Cardiac: Normal rate Lungs: Normal effort Psych: Unable to assess at this time  ED Course / MDM  EKG:EKG Interpretation Date/Time:  Tuesday July 27 2023 09:24:26 EST Ventricular Rate:  125 PR Interval:  112 QRS Duration:  66 QT Interval:  314 QTC Calculation: 453 R Axis:   -1  Text Interpretation: Sinus tachycardia Confirmed by Ruthe Cornet [phone]) on 07/27/2023 4:56:01 PM  I have reviewed the labs performed to date as well as medications administered while in observation.  Recent changes in the last 24 hours include patient did require IM medications last evening..  Plan  Current plan is for placement.  Unfortunately, this patient has proved a bit difficult to place.SABRA [name], DO 08/01/23 1114

## 2023-08-01 NOTE — ED Notes (Signed)
Pt refusing meal tray.

## 2023-08-01 NOTE — ED Notes (Addendum)
 Pt agitated not following directions, IM medication given. Sitter left foam cup in room, half cup eaten by pt. Pt vomit x2. Pulling sticker off wall. Pulling hair out and eating it. Dr Tegeler notified

## 2023-08-01 NOTE — ED Notes (Signed)
 Pt refusing all meds, not following directions, yelling & hitting the wall.

## 2023-08-01 NOTE — ED Notes (Signed)
Pt refused food tray. °

## 2023-08-01 NOTE — ED Notes (Signed)
 Pt requesting graham crackers and PB

## 2023-08-01 NOTE — ED Notes (Signed)
 Patient had a bowel movement on self, stated she couldn't hold it in, but she also did not let the sitter or this RN know that she needed to use the bathroom, Patient has been ambulating to the bathroom with the sitter and she was ambulated to the bathroom approx 0100

## 2023-08-02 NOTE — ED Notes (Signed)
 Patient  refuse her morning medications

## 2023-08-02 NOTE — Progress Notes (Signed)
 CSW spoke with Delilah of [employer] who states patient is still under review for [facility] for residential placement. CSW provided Delilah with updates regarding patient requiring IM medication and wrist restraints - Delilah will communicate this information with staff that are searching for placement.  Niels Portugal, MSW, LCSW Transitions of Care  Clinical Social Worker II [phone]

## 2023-08-02 NOTE — ED Notes (Signed)
Patient ambulated to bathroom w/o assistance

## 2023-08-02 NOTE — ED Notes (Signed)
 Patient states she is nauseous and has pain in her right hip. Patient offered PRN pain medication but declines.

## 2023-08-02 NOTE — ED Provider Notes (Signed)
 Emergency Medicine Observation Re-evaluation Note  Megan Manning is a 43 y.o. female, seen on rounds today.  Pt initially presented to the ED for complaints of neuropathy pain Currently, the patient is intermittently agitated.  Physical Exam  BP 119/67 (BP Location: Left Wrist)   Pulse (!) 111   Temp 99 F (37.2 C) (Oral)   Resp 20   Ht 1.6 m (5' 3)   Wt (!) 154.7 kg   SpO2 98%   BMI 60.41 kg/m  Physical Exam General: Does not appear to be in distress Cardiac: Heart rate intermittently mildly tachycardic Lungs: No hypoxia no increased work of breathing Psych: Intermittently agitated  ED Course / MDM  EKG:EKG Interpretation Date/Time:  Tuesday July 27 2023 09:24:26 EST Ventricular Rate:  125 PR Interval:  112 QRS Duration:  66 QT Interval:  314 QTC Calculation: 453 R Axis:   -1  Text Interpretation: Sinus tachycardia Confirmed by Ruthe Cornet [phone]) on 07/27/2023 4:56:01 PM  I have reviewed the labs performed to date as well as medications administered while in observation.  Recent changes in the last 24 hours include continues to try to eat objects in the room.  Plan  Current plan is for placement, unfortunately difficult to place secondary to behaviors and intellectual disability.    Cleotilde Rogue, MD 08/02/23 [phone]

## 2023-08-02 NOTE — ED Notes (Signed)
 This patient's sitter has left (her shift ended at 2300). There is no one currently to replace the sitter, charge RN notified.

## 2023-08-03 NOTE — ED Notes (Addendum)
 Patient refused her morning dose of Synthroid, stating "I don't want it"

## 2023-08-03 NOTE — ED Notes (Signed)
 Patient declined 2 of 4 medications ordered for 2200 this evening. Patient did cooperate and agree to take Protonix and Seroquel by mouth.

## 2023-08-03 NOTE — ED Notes (Signed)
 IVC CURRENT EXP 1/13

## 2023-08-03 NOTE — ED Notes (Signed)
 Mildly anxious, still c/o stomach and leg pain, "severe". Will treat anxiety and pain.

## 2023-08-03 NOTE — ED Provider Notes (Signed)
 Emergency Medicine Observation Re-evaluation Note  Megan Manning is a 43 y.o. female, seen on rounds today.  Pt initially presented to the ED for complaints of neuropathy pain Currently, the patient is sleeping.  Physical Exam  BP 133/86 (BP Location: Right Wrist)   Pulse (!) 107 Comment: RN notified  Temp 98.7 F (37.1 C) (Oral)   Resp 20   Ht 5' 3 (1.6 m)   Wt (!) 154.7 kg   SpO2 90%   BMI 60.41 kg/m  Physical Exam General: Sleeping Cardiac: Mild tachycardia earlier Lungs: No increased work of breathing Psych: Calm, patient declines morning meds  ED Course / MDM  EKG:EKG Interpretation Date/Time:  Sunday August 01 2023 11:50:31 EST Ventricular Rate:  124 PR Interval:  114 QRS Duration:  68 QT Interval:  306 QTC Calculation: 439 R Axis:   14  Text Interpretation: Sinus tachycardia with Fusion complexes Cannot rule out Anterior infarct , age undetermined Abnormal ECG When compared with ECG of 27-Jul-2023 09:24, PREVIOUS ECG IS PRESENT since last tracing no significant change Confirmed by [name] [phone]) on 08/02/2023 11:30:25 AM  I have reviewed the labs performed to date as well as medications administered while in observation.  Recent changes in the last 24 hours include none aside from patient noncompliance with medication.  Plan  Current plan is for placement.    Garrick Charleston, MD 08/03/23 1028

## 2023-08-03 NOTE — ED Notes (Signed)
 Patient called out and stated "I'm hurting". I offered her either PRN Tylenol or PRN Ibuprofen; patient states "I'll be ok".

## 2023-08-03 NOTE — ED Notes (Signed)
 Patient sleeping. Sitter went on their lunch break.

## 2023-08-03 NOTE — ED Notes (Addendum)
 Pt arousable. Alert, NAD, calm, interactive, eating. VSS. Agreeable to some meds, but not others. Agreeable to something for abd pain, nausea, and HA. Declined other meds. Rationale of medications discussed. Mentions leg pain, severe, but declines acetaminophen  or ibuprofen . No s/sx of pain except verbal mentioning.

## 2023-08-03 NOTE — ED Notes (Signed)
 Patient c/o "hurting all over."  States she took Tylenol earlier without relief.  Offered Tylenol or Motrin and patient refused at first.  Patient then requested Tylenol for headache.

## 2023-08-03 NOTE — ED Notes (Signed)
 Received patient's scheduled Synthroid from pharmacy. Will wait until patient is awake to give her this medication or pass to day shift.

## 2023-08-03 NOTE — ED Notes (Signed)
 Patient refused all night time medication.  Attempted to explain importance of medication and patient continued to refused.  Explained in detail what each medication helped with and patient continued to refuse.  Patient reports I don't need any medication.  I will be okay.

## 2023-08-04 NOTE — ED Notes (Signed)
 Pt reporting leg pain and requesting tylenol.

## 2023-08-04 NOTE — Progress Notes (Signed)
 CSW spoke with Delilah from [facility] to provide her with updates regarding patient's behavior and refusing medications. Delilah states she has not received any offers for residential placement yet but several are pending.  Niels Portugal, MSW, LCSW Transitions of Care  Clinical Social Worker II [phone]

## 2023-08-04 NOTE — ED Notes (Addendum)
 Pt reporting leg pain.

## 2023-08-04 NOTE — ED Notes (Signed)
 This RN assumed care of patient and received off going transfer of care report from off going RN. Pt showering at this time.

## 2023-08-04 NOTE — ED Notes (Signed)
 IVC'd 08/02/23, exp 08/09/23, documents in purple zone

## 2023-08-04 NOTE — ED Notes (Signed)
NAD noted, respirations are equal bilaterally and unlabored at this time. Pt resting in gurney and denies any unmet needs. Sitter at bedside.  

## 2023-08-04 NOTE — ED Provider Notes (Addendum)
 Emergency Medicine Observation Re-evaluation Note  Megan Manning is a 43 y.o. female, seen on rounds today.  Pt initially presented to the ED for complaints of neuropathy pain Currently, the patient is sleeping.  Physical Exam  BP 127/76   Pulse 84   Temp 98.5 F (36.9 C) (Oral)   Resp 19   Ht 5' 3 (1.6 m)   Wt (!) 154.7 kg   SpO2 97%   BMI 60.41 kg/m  Physical Exam General: Sleeping Cardiac: Mild tachycardia earlier Lungs: No increased work of breathing Psych: Calm, patient declines morning meds  ED Course / MDM  EKG:EKG Interpretation Date/Time:  Sunday August 01 2023 11:50:31 EST Ventricular Rate:  124 PR Interval:  114 QRS Duration:  68 QT Interval:  306 QTC Calculation: 439 R Axis:   14  Text Interpretation: Sinus tachycardia with Fusion complexes Cannot rule out Anterior infarct , age undetermined Abnormal ECG When compared with ECG of 27-Jul-2023 09:24, PREVIOUS ECG IS PRESENT since last tracing no significant change Confirmed by [name] [phone]) on 08/02/2023 11:30:25 AM  I have reviewed the labs performed to date as well as medications administered while in observation.  Recent changes in the last 24 hours include none aside from patient noncompliance with medication the patient did ask me to take a look at her right leg.  She tells me she has been having some pain on the right leg in the front and on the side.  Seems to come and go.  I do not appreciate any obvious lesions.  She is neurovascularly intact distally.  No obvious signs of trauma.  Will treat with NSAIDs..  Plan  Current plan is for placement.      Emil Share, DO 08/04/23 1155    Emil Share, DO 08/04/23 1324

## 2023-08-04 NOTE — ED Notes (Signed)
 Pt initially refused meds, but after 3 separate encouragements she took them.

## 2023-08-05 LAB — RESP PANEL BY RT-PCR (RSV, FLU A&B, COVID)  RVPGX2
Influenza A by PCR: NEGATIVE
Influenza B by PCR: NEGATIVE
Resp Syncytial Virus by PCR: NEGATIVE
SARS Coronavirus 2 by RT PCR: NEGATIVE

## 2023-08-05 NOTE — ED Provider Notes (Signed)
 Emergency Medicine Observation Re-evaluation Note  Megan Manning is a 43 y.o. female, seen on rounds today.  Pt initially presented to the ED for complaints of neuropathy pain Currently, the patient is resting comfortably.  Physical Exam  BP (!) 133/52 (BP Location: Right Wrist)   Pulse 79   Temp 99.4 F (37.4 C) (Oral)   Resp 19   Ht 5' 3 (1.6 m)   Wt (!) 154.7 kg   SpO2 95%   BMI 60.41 kg/m  Physical Exam Vitals and nursing note reviewed.  Constitutional:      General: She is not in acute distress.    Appearance: She is well-developed.  HENT:     Head: Normocephalic and atraumatic.  Eyes:     Conjunctiva/sclera: Conjunctivae normal.  Pulmonary:     Effort: Pulmonary effort is normal. No respiratory distress.  Musculoskeletal:        General: No swelling.     Cervical back: Neck supple.  Skin:    General: Skin is warm and dry.     Capillary Refill: Capillary refill takes less than 2 seconds.  Neurological:     Mental Status: She is alert.  Psychiatric:        Mood and Affect: Mood normal.      ED Course / MDM  EKG:EKG Interpretation Date/Time:  Sunday August 01 2023 11:50:31 EST Ventricular Rate:  124 PR Interval:  114 QRS Duration:  68 QT Interval:  306 QTC Calculation: 439 R Axis:   14  Text Interpretation: Sinus tachycardia with Fusion complexes Cannot rule out Anterior infarct , age undetermined Abnormal ECG When compared with ECG of 27-Jul-2023 09:24, PREVIOUS ECG IS PRESENT since last tracing no significant change Confirmed by [name] [phone]) on 08/02/2023 11:30:25 AM  I have reviewed the labs performed to date as well as medications administered while in observation.  Recent changes in the last 24 hours include continued TOC work for placement.  Plan  Current plan is for placement.    Albertina Dixon, MD 08/05/23 [phone]

## 2023-08-06 NOTE — Progress Notes (Signed)
 CSW spoke with Delilah at [facility] requesting updates regarding patient's medication. CSW sent medication list via secure email to Delilah for the [facility] psychiatrist to review.  Niels Portugal, MSW, LCSW Transitions of Care  Clinical Social Worker II [phone]

## 2023-08-06 NOTE — ED Provider Notes (Signed)
 Emergency Medicine Observation Re-evaluation Note  Megan Manning is a 43 y.o. female, seen on rounds today.  Pt initially presented to the ED for complaints of neuropathy pain Currently, the patient is resting comfortably.  Physical Exam  BP (!) 143/59 (BP Location: Right Arm)   Pulse 99   Temp 98.7 F (37.1 C)   Resp 19   Ht 5' 3 (1.6 m)   Wt (!) 154.7 kg   SpO2 92%   BMI 60.41 kg/m  Physical Exam Vitals and nursing note reviewed.  Constitutional:      General: She is not in acute distress.    Appearance: She is well-developed.  HENT:     Head: Normocephalic and atraumatic.  Eyes:     Conjunctiva/sclera: Conjunctivae normal.  Pulmonary:     Effort: Pulmonary effort is normal. No respiratory distress.  Musculoskeletal:        General: No swelling.     Cervical back: Neck supple.  Skin:    General: Skin is warm and dry.     Capillary Refill: Capillary refill takes less than 2 seconds.  Neurological:     Mental Status: She is alert.  Psychiatric:        Mood and Affect: Mood normal.      ED Course / MDM  EKG:EKG Interpretation Date/Time:  Sunday August 01 2023 11:50:31 EST Ventricular Rate:  124 PR Interval:  114 QRS Duration:  68 QT Interval:  306 QTC Calculation: 439 R Axis:   14  Text Interpretation: Sinus tachycardia with Fusion complexes Cannot rule out Anterior infarct , age undetermined Abnormal ECG When compared with ECG of 27-Jul-2023 09:24, PREVIOUS ECG IS PRESENT since last tracing no significant change Confirmed by [name] [phone]) on 08/02/2023 11:30:25 AM  I have reviewed the labs performed to date as well as medications administered while in observation.  Recent changes in the last 24 hours include continued TOC work for placement.  Plan  Current plan is for placement.     Franklyn Sid LOISE, MD 08/06/23 1050

## 2023-08-06 NOTE — ED Notes (Signed)
 Pt refusing breakfast tray, asking for graham crackers instead. Graham crackers provided as well as water.

## 2023-08-06 NOTE — ED Notes (Signed)
 Pt ambulated to bathroom w steady gait

## 2023-08-07 MED ORDER — ZIPRASIDONE MESYLATE 20 MG IM SOLR
20.0000 mg | Freq: Once | INTRAMUSCULAR | Status: AC
  Administered 2023-08-07: 20 mg via INTRAMUSCULAR
  Filled 2023-08-07: qty 20

## 2023-08-07 MED ORDER — STERILE WATER FOR INJECTION IJ SOLN
INTRAMUSCULAR | Status: AC
  Administered 2023-08-07: 1.2 mL
  Filled 2023-08-07: qty 10

## 2023-08-07 MED ORDER — DIAZEPAM 5 MG/ML IJ SOLN
5.0000 mg | Freq: Four times a day (QID) | INTRAMUSCULAR | Status: DC | PRN
  Administered 2023-08-07 – 2023-08-25 (×2): 5 mg via INTRAMUSCULAR
  Filled 2023-08-07 (×2): qty 2

## 2023-08-07 MED ORDER — PHENOL 1.4 % MT LIQD
1.0000 | OROMUCOSAL | Status: DC | PRN
  Administered 2023-08-25: 1 via OROMUCOSAL
  Filled 2023-08-07: qty 177

## 2023-08-07 NOTE — ED Notes (Signed)
 Patient got up set with RN because she was refusing meds and keeps asking for the [email or web address] see her; EDP seen patient at 7:30pm tonight; Pt took off arm band and swallowed; Sitter at bedside and security in unit for support; Consulting civil engineer notified-[name],RN

## 2023-08-07 NOTE — ED Notes (Signed)
 Pt request to speak with doctor with c/o swelling to left side of neck, also c/o blister to lips. Message sent to Dr Maple Hudson, waiting for response.

## 2023-08-07 NOTE — ED Provider Notes (Signed)
 Emergency Medicine Observation Re-evaluation Note  Megan Manning is a 43 y.o. female, seen on rounds today.  Pt initially presented to the ED for complaints of neuropathy pain Currently, the patient is resting comfortably. Complains of sore throat and congestion   Physical Exam  BP 123/71 (BP Location: Right Arm)   Pulse 80   Temp 98.7 F (37.1 C) (Oral)   Resp 18   Ht 5' 3 (1.6 m)   Wt (!) 154.7 kg   SpO2 95%   BMI 60.41 kg/m  Physical Exam General: NAD Cardiac: RR Lungs: non-labored breathing  Psych: Calm ENT; uvula midline; no erythema to tonsils. No exudate. No angioedema   ED Course / MDM  EKG:EKG Interpretation Date/Time:  Sunday August 01 2023 11:50:31 EST Ventricular Rate:  124 PR Interval:  114 QRS Duration:  68 QT Interval:  306 QTC Calculation: 439 R Axis:   14  Text Interpretation: Sinus tachycardia with Fusion complexes Cannot rule out Anterior infarct , age undetermined Abnormal ECG When compared with ECG of 27-Jul-2023 09:24, PREVIOUS ECG IS PRESENT since last tracing no significant change Confirmed by [name] [phone]) on 08/02/2023 11:30:25 AM  I have reviewed the labs performed to date as well as medications administered while in observation.  Recent changes in the last 24 hours include none   Plan  Current plan is for placement .    Neysa Caron PARAS, DO 08/07/23 2009

## 2023-08-07 NOTE — ED Notes (Signed)
 ED Provider at bedside.

## 2023-08-08 MED ORDER — POLYETHYLENE GLYCOL 3350 17 G PO PACK
17.0000 g | PACK | Freq: Every day | ORAL | Status: DC
  Administered 2023-08-10 – 2023-08-27 (×12): 17 g via ORAL
  Filled 2023-08-08 (×16): qty 1

## 2023-08-08 NOTE — ED Notes (Signed)
 Explained to pt the reason for the restraints and mittens to keep her safe from hurting herself. Pt nodded in understanding. Explained that we would try to get her out of the restraints and mittens later. Pt denies need of anything at this time and pt is back to refusing her meds again.

## 2023-08-08 NOTE — ED Provider Notes (Signed)
 Emergency Medicine Observation Re-evaluation Note  Megan Manning is a 43 y.o. female, seen on rounds today.  Pt initially presented to the ED for complaints of neuropathy pain Currently, the patient is resting; complains of nausea and constipation.   Physical Exam  BP 139/79 (BP Location: Right Arm)   Pulse 85   Temp 98.9 F (37.2 C) (Oral)   Resp 19   Ht 5' 3 (1.6 m)   Wt (!) 154.7 kg   SpO2 92%   BMI 60.41 kg/m  Physical Exam General: NAD Cardiac: RR Lungs: normal WOB  Psych: Calm and cooperative Abd; soft, non-tender.   ED Course / MDM  EKG:EKG Interpretation Date/Time:  Sunday August 01 2023 11:50:31 EST Ventricular Rate:  124 PR Interval:  114 QRS Duration:  68 QT Interval:  306 QTC Calculation: 439 R Axis:   14  Text Interpretation: Sinus tachycardia with Fusion complexes Cannot rule out Anterior infarct , age undetermined Abnormal ECG When compared with ECG of 27-Jul-2023 09:24, PREVIOUS ECG IS PRESENT since last tracing no significant change Confirmed by [name] [phone]) on 08/02/2023 11:30:25 AM  I have reviewed the labs performed to date as well as medications administered while in observation.  Recent changes in the last 24 hours include none  Plan  Current plan is for placement.SABRA Neysa Caron JINNY, DO 08/08/23 1956

## 2023-08-08 NOTE — ED Notes (Signed)
 Dr young rounding

## 2023-08-08 NOTE — ED Notes (Signed)
 Patient able to take of restraints and picked up object off the floor and ate it; Staff discovered the object was a tie cut from the gown; Pt breathing normal and still have gag reflex; sitter put back in restraints and mittens applied due to her scratching her body causing reddness-[name],RN

## 2023-08-08 NOTE — ED Notes (Signed)
 Pt complaint of neck swelling, vomiting (spit) and leg pain

## 2023-08-08 NOTE — ED Notes (Signed)
 Patient was found banging head on rails and forcing her body to the top of the stretcher; pt was found with bed almost toppling over; Patient placed back in hospital bed and NV restraints reapplied-[name],RN

## 2023-08-08 NOTE — ED Notes (Signed)
 Patient in the room yelling and hollering obscenities to staff and disrupting the whole unit at this time; RN to continue to monitor; Ophelia Charter remains in site of patient-[name],RN

## 2023-08-08 NOTE — ED Notes (Signed)
 Pt agreeable to not have same behaviors that got her put in restraints. Explained to pt that if she starts eating things she shouldn't, harming self, restraints will have to be put back on for protection and safety. Pt verbalized understanding. Pt agreeable to take her meds that she refused earlier and to allow this RN to take her VS.

## 2023-08-08 NOTE — ED Notes (Signed)
Pt uncooperative and refusing VS.

## 2023-08-08 NOTE — ED Notes (Signed)
SNACKS GIVEN.

## 2023-08-08 NOTE — ED Notes (Signed)
 Pt showered independently. Bed linens changed by Faith NT and new gown provided.

## 2023-08-08 NOTE — ED Notes (Signed)
 RN went in to readjust restraints patient attempted to spit on RN; RN pushed patient's head to the left side and placed gown over mouth to prevent spit from hitting RN or other staff present; restraints readjusted-[name],RN

## 2023-08-08 NOTE — ED Notes (Signed)
 Pt refusing metoprolol and synthroid

## 2023-08-09 ENCOUNTER — Emergency Department (HOSPITAL_COMMUNITY): Payer: MEDICAID

## 2023-08-09 NOTE — ED Provider Notes (Signed)
 Patient previously had an IVC.  She is currently waiting for social work placement.  It looks like per the notes she had been IVC because she wants eating stuff in the room and not taking her medications.  Her IVC expired today at 3 PM.  However apparently the system is down and the magistrate cannot accept any IVCs unless it is in person.  At this point do not feel that patient needs an emergent removal of her IVC.  This can be reevaluated tomorrow when the system  is hopefully back up and running.   [name], MD 08/09/23 [phone]

## 2023-08-09 NOTE — ED Notes (Signed)
 Pt notified writer that she's experiencing centralized sharp chest pain and that it has been going on intermittently for past week. Writer obtained set of vital signs on patient to find heard rate in 130s and O2 89-90%. Dr. Lenor notifed. Writer performed EKG and gave to Dr. Lenor. Per Dr. Lenor, plan is to obtain Chest xray and monitor vital signs for the time being and notify MD if not improving.

## 2023-08-09 NOTE — ED Provider Notes (Signed)
 Emergency Medicine Observation Re-evaluation Note  Megan Manning is a 43 y.o. female, seen on rounds today.  Pt initially presented to the ED for complaints of neuropathy pain Currently, the patient is asleep in her bed, nursing staff have no complaint. Patient has been in the ER now for over 500 hours, awaiting placement.  Physical Exam  BP 128/74 (BP Location: Left Arm)   Pulse (!) 103   Temp 99.1 F (37.3 C) (Oral)   Resp 20   Ht 5' 3 (1.6 m)   Wt (!) 154.7 kg   SpO2 99%   BMI 60.41 kg/m  Physical Exam General: No distress Cardiac: Regular rate Lungs: No respiratory distress Psych: Currently calm, resting  ED Course / MDM  EKG:EKG Interpretation Date/Time:  Sunday August 01 2023 11:50:31 EST Ventricular Rate:  124 PR Interval:  114 QRS Duration:  68 QT Interval:  306 QTC Calculation: 439 R Axis:   14  Text Interpretation: Sinus tachycardia with Fusion complexes Cannot rule out Anterior infarct , age undetermined Abnormal ECG When compared with ECG of 27-Jul-2023 09:24, PREVIOUS ECG IS PRESENT since last tracing no significant change Confirmed by Megan Manning [phone]) on 08/02/2023 11:30:25 AM  I have reviewed the labs performed to date as well as medications administered while in observation.  Recent changes in the last 24 hours include -no new changes, no recent updates with social work.  Plan  Current plan is for holding patient for placement.    Megan Sora, MD 08/09/23 [phone]

## 2023-08-09 NOTE — ED Notes (Addendum)
 RN spoke to Dr Lenor the patient IVC paperwork expired today and the paperwork cannot be sent to the magistrate office, because there system is not working. The paperwork have to be physically presented to the magistrate office. Pt was assess by Dr Lenor, who said that the paperwork does not have to be renewed today, it can be address tomorrow when the system is up.

## 2023-08-09 NOTE — ED Notes (Signed)
Patient refuse morning medications.

## 2023-08-09 NOTE — Progress Notes (Signed)
 CSW spoke with Delilah at [facility] who states patient has been faxed to [facility], [place], and [employer]. CSW informed Delilah of patient's recent behaviors and medication refusals.  Niels Portugal, MSW, LCSW Transitions of Care  Clinical Social Worker II [phone]

## 2023-08-09 NOTE — ED Notes (Signed)
 Patient refuse breakfast and lunch

## 2023-08-09 NOTE — ED Provider Notes (Signed)
 Patient reported some chest pain.  Tenderness to area of her chest.  It is reproducible on palpation.  She was noted to be tachycardic.  EKG was performed which shows no ischemic changes.  Given her history of eating foreign objects, chest x-ray was performed which shows no free air, no pneumonia, no other acute abnormality.  Her oxygen saturation is around 93 to 94%.  She does not have any increased work of breathing.  Her lungs are clear.  I have reviewed her chart and she has had some episodes of tachycardia that resolved.  Per the nurse, she has refused her last several doses of metoprolol .  However she has agreed to take it currently.  Will monitor her heart rate.   [name], MD 08/09/23 2308

## 2023-08-09 NOTE — ED Notes (Signed)
 Belfi, MD notified of patient's HR coming down to 107 and O2 maintaining at 93% on RA. MD also notified that pain has not improved.

## 2023-08-10 LAB — CBC WITH DIFFERENTIAL/PLATELET
Abs Immature Granulocytes: 0.01 10*3/uL (ref 0.00–0.07)
Basophils Absolute: 0 10*3/uL (ref 0.0–0.1)
Basophils Relative: 1 %
Eosinophils Absolute: 0.3 10*3/uL (ref 0.0–0.5)
Eosinophils Relative: 5 %
HCT: 36.2 % (ref 36.0–46.0)
Hemoglobin: 11.4 g/dL — ABNORMAL LOW (ref 12.0–15.0)
Immature Granulocytes: 0 %
Lymphocytes Relative: 38 %
Lymphs Abs: 2.5 10*3/uL (ref 0.7–4.0)
MCH: 28.1 pg (ref 26.0–34.0)
MCHC: 31.5 g/dL (ref 30.0–36.0)
MCV: 89.4 fL (ref 80.0–100.0)
Monocytes Absolute: 0.4 10*3/uL (ref 0.1–1.0)
Monocytes Relative: 6 %
Neutro Abs: 3.2 10*3/uL (ref 1.7–7.7)
Neutrophils Relative %: 50 %
Platelets: 258 10*3/uL (ref 150–400)
RBC: 4.05 MIL/uL (ref 3.87–5.11)
RDW: 12.9 % (ref 11.5–15.5)
WBC: 6.5 10*3/uL (ref 4.0–10.5)
nRBC: 0 % (ref 0.0–0.2)

## 2023-08-10 LAB — BASIC METABOLIC PANEL
Anion gap: 11 (ref 5–15)
BUN: 12 mg/dL (ref 6–20)
CO2: 23 mmol/L (ref 22–32)
Calcium: 8.7 mg/dL — ABNORMAL LOW (ref 8.9–10.3)
Chloride: 105 mmol/L (ref 98–111)
Creatinine, Ser: 0.67 mg/dL (ref 0.44–1.00)
GFR, Estimated: >60 mL/min (ref >60)
Glucose, Bld: 97 mg/dL (ref 70–99)
Potassium: 4.1 mmol/L (ref 3.5–5.1)
Sodium: 139 mmol/L (ref 135–145)

## 2023-08-10 LAB — D-DIMER, QUANTITATIVE: D-Dimer, Quant: <0.27 ug{FEU}/mL (ref 0.00–0.50)

## 2023-08-10 NOTE — Progress Notes (Signed)
 CSW obtained contact information for patient's legal guardian. The legal guardian is Harlene Duke with [address] [place], [place] @ [phone] or [phone].  CSW attempted to reach [place] without success - a voicemail was left requesting a return call.  Niels Portugal, MSW, LCSW Transitions of Care  Clinical Social Worker II [phone]

## 2023-08-10 NOTE — ED Notes (Signed)
 Pt refusing morning medications at this time and stated, "I will think about it". Will attempt to give pt her meds that are due at another time.

## 2023-08-10 NOTE — ED Provider Notes (Signed)
 Emergency Medicine Observation Re-evaluation Note  Megan Manning is a 43 y.o. female, seen on rounds today.  Pt initially presented to the ED for complaints of neuropathy pain Currently, the patient is in her room, tachycardia has resolved.  Physical Exam  BP 119/63 (BP Location: Right Arm)   Pulse 88   Temp 99.3 F (37.4 C) (Oral)   Resp 18   Ht 5' 3 (1.6 m)   Wt (!) 154.7 kg   SpO2 91%   BMI 60.41 kg/m  Physical Exam Eyes:     Pupils: Pupils are equal, round, and reactive to light.  Cardiovascular:     Rate and Rhythm: Normal rate.  Pulmonary:     Effort: Pulmonary effort is normal.  Abdominal:     General: There is no distension.  Neurological:     Mental Status: She is alert.      ED Course / MDM  EKG:EKG Interpretation Date/Time:  Monday August 09 2023 22:08:32 EST Ventricular Rate:  127 PR Interval:  128 QRS Duration:  56 QT Interval:  288 QTC Calculation: 418 R Axis:   48  Text Interpretation: Sinus tachycardia Low voltage QRS Nonspecific ST and T wave abnormality Abnormal ECG When compared with ECG of 08-Aug-2023 21:31, PREVIOUS ECG IS PRESENT Confirmed by [name] [phone]) on 08/09/2023 10:11:52 PM  I have reviewed the labs performed to date as well as medications administered while in observation.  Recent changes in the last 24 hours include there are some confusion about whether or not the patient was an IVC patient.  Reviewed the patient's psychiatry note, she is not a candidate for IVC, this is more behavioral below.  Patient is here voluntarily, and has expressed no desire to leave.  Her guardian also wishes for her to be placed in a new living situation..  Plan  Current plan is for placement.    [name], DO 08/10/23 1445

## 2023-08-10 NOTE — ED Notes (Signed)
 Crocs placed in bag and labeled. Placed in small locker #2

## 2023-08-11 NOTE — ED Provider Notes (Signed)
 Emergency Medicine Observation Re-evaluation Note  Megan Manning is a 43 y.o. female, seen on rounds today.  Pt initially presented to the ED for complaints of neuropathy pain Currently, the patient is resting comfortably and reports some nausea.  Physical Exam  BP (!) 141/43 (BP Location: Right Wrist)   Pulse 90   Temp 99 F (37.2 C) (Oral)   Resp 18   Ht 5\' 3"  (1.6 m)   Wt (!) 154.7 kg   SpO2 95%   BMI 60.41 kg/m  Physical Exam General: Awake. Alert. No acute distress Cardiac: Regular rate rhythm Lungs: Clear to auscultation bilaterally Psych: Calm and cooperative  ED Course / MDM  EKG:EKG Interpretation Date/Time:  Monday August 09 2023 22:08:32 EST Ventricular Rate:  127 PR Interval:  128 QRS Duration:  56 QT Interval:  288 QTC Calculation: 418 R Axis:   48  Text Interpretation: Sinus tachycardia Low voltage QRS Nonspecific ST and T wave abnormality Abnormal ECG When compared with ECG of 08-Aug-2023 21:31, PREVIOUS ECG IS PRESENT Confirmed by Hershel Los [phone]) on 08/09/2023 10:11:52 PM  I have reviewed the labs performed to date as well as medications administered while in observation.  Recent changes in the last 24 hours include patient reported a couple episodes of vomiting in last 24 hours.  She has Zofran  ordered as needed  Plan  Current plan is for continued boarding in the ED awaiting placement.    Sallyanne Creamer, DO 08/11/23 [phone]

## 2023-08-11 NOTE — ED Notes (Signed)
 Pt refusing meds. Pt reports she doesn't feel good and that she has been throwing up all night. Per report from night RN, pt was not throwing up last night.

## 2023-08-12 NOTE — ED Provider Notes (Signed)
Emergency Medicine Observation Re-evaluation Note  Megan Manning is a 43 y.o. female, seen on rounds today.  Pt initially presented to the ED for complaints of neuropathy pain Currently, the patient is currently complaining that she has pain in her upper abdomen and nausea.  Said she did not want to eat anything today because she was nauseated.  No other specific complaints.  Physical Exam  BP 119/62   Pulse 100   Temp 98.7 F (37.1 C) (Oral)   Resp 18   Ht 5\' 3"  (1.6 m)   Wt (!) 154.7 kg   SpO2 95%   BMI 60.41 kg/m  Physical Exam General: Calm and cooperative Cardiac: Regular Lungs: Clear Psych: Calm and cooperative Abdomen with normal bowel sounds, mild epigastric discomfort.  No peritoneal signs ED Course / MDM  EKG:EKG Interpretation Date/Time:  Monday August 09 2023 22:08:32 EST Ventricular Rate:  127 PR Interval:  128 QRS Duration:  56 QT Interval:  288 QTC Calculation: 418 R Axis:   48  Text Interpretation: Sinus tachycardia Low voltage QRS Nonspecific ST and T wave abnormality Abnormal ECG When compared with ECG of 08-Aug-2023 21:31, PREVIOUS ECG IS PRESENT Confirmed by Rolan Bucco [phone]) on 08/09/2023 10:11:52 PM  I have reviewed the labs performed to date as well as medications administered while in observation.  Recent changes in the last 24 hours include nursing reporting patient is still refusing medications and being manipulative.  However will intermittently agree to take them.  Patient did not require any IM medications last night.  Plan  Current plan is for group home placement.  Patient currently has ODT Zofran as needed for nausea.  No noted vomiting.    Gwyneth Sprout, MD 08/12/23 [phone]

## 2023-08-12 NOTE — ED Notes (Signed)
Patient refusing vitals-[name],RN

## 2023-08-13 NOTE — ED Notes (Signed)
Pt behavior w/ multiple complaints for attention-seeking behavior

## 2023-08-13 NOTE — ED Notes (Signed)
Pt c/o nausea. Pt in NAD

## 2023-08-13 NOTE — ED Notes (Addendum)
Pt asking for "therapy" for her leg. Explained that I can give her tylenol for her chronic leg pain. Pt refused. Pt ambulatory w/ steady gait.

## 2023-08-13 NOTE — ED Provider Notes (Signed)
Emergency Medicine Observation Re-evaluation Note  Megan Manning is a 43 y.o. female, seen on rounds today.  Pt initially presented to the ED for complaints of neuropathy pain Currently, the patient is resting.  Physical Exam  BP (!) 152/91 (BP Location: Right Wrist)   Pulse 90   Temp 99 F (37.2 C) (Oral)   Resp 20   Ht 5\' 3"  (1.6 m)   Wt (!) 154.7 kg   SpO2 95%   BMI 60.41 kg/m  Physical Exam General: NAD  ED Course / MDM  EKG:EKG Interpretation Date/Time:  Monday August 09 2023 22:08:32 EST Ventricular Rate:  127 PR Interval:  128 QRS Duration:  56 QT Interval:  288 QTC Calculation: 418 R Axis:   48  Text Interpretation: Sinus tachycardia Low voltage QRS Nonspecific ST and T wave abnormality Abnormal ECG When compared with ECG of 08-Aug-2023 21:31, PREVIOUS ECG IS PRESENT Confirmed by Rolan Bucco [phone]) on 08/09/2023 10:11:52 PM  I have reviewed the labs performed to date as well as medications administered while in observation.  Recent changes in the last 24 hours include no acute events reported.  Plan  Current plan is for placement.    Wynetta Fines, MD 08/13/23 [phone]

## 2023-08-14 NOTE — ED Notes (Signed)
Pt refuse medications

## 2023-08-14 NOTE — ED Provider Notes (Signed)
Emergency Medicine Observation Re-evaluation Note  Megan Manning is a 43 y.o. female, seen on rounds today.  Pt initially presented to the ED for complaints of neuropathy pain Currently, the patient is awaiting group home placement.   Physical Exam  BP 108/62 (BP Location: Right Arm)   Pulse (!) 121   Temp 98.3 F (36.8 C) (Oral)   Resp 20   Ht 5\' 3"  (1.6 m)   Wt (!) 154.7 kg   SpO2 93%   BMI 60.41 kg/m  Physical Exam General: Alert.  No acute distress.  No respiratory distress. Cardiac: Regular no rub murmur gallop Lungs: Clear to auscultation Psych: Alert and cooperative at this time. musculoskeletal: Patient has significantly obese lower extremities however there is no edema and no calf tenderness.  Both feet are warm and dry without swelling or erythema.  ED Course / MDM  EKG:EKG Interpretation Date/Time:  Monday August 09 2023 22:08:32 EST Ventricular Rate:  127 PR Interval:  128 QRS Duration:  56 QT Interval:  288 QTC Calculation: 418 R Axis:   48  Text Interpretation: Sinus tachycardia Low voltage QRS Nonspecific ST and T wave abnormality Abnormal ECG When compared with ECG of 08-Aug-2023 21:31, PREVIOUS ECG IS PRESENT Confirmed by Rolan Bucco [phone]) on 08/09/2023 10:11:52 PM  I have reviewed the labs performed to date as well as medications administered while in observation.  Recent changes in the last 24 hours include none.  Per nursing report patient is intermittently refusing medications.  This current time, she is calm and not having any acute behavioral problems.  Will have nursing staff continue supportive measures to try to encourage compliance with medications.  Plan  Current plan is for group home placement.    Arby Barrette, MD 08/14/23 1311

## 2023-08-14 NOTE — ED Notes (Addendum)
Pt refused all nightly medications. Pt reports their chest still hurts, educated pt scheduled medications will help with their pain, as ordered by Dr. Donnald Garre. Per MD as reported earlier to [facility], do not perform EKG.

## 2023-08-15 LAB — RESP PANEL BY RT-PCR (RSV, FLU A&B, COVID)  RVPGX2
Influenza A by PCR: NEGATIVE
Influenza B by PCR: NEGATIVE
Resp Syncytial Virus by PCR: NEGATIVE
SARS Coronavirus 2 by RT PCR: NEGATIVE

## 2023-08-15 NOTE — ED Provider Notes (Addendum)
Emergency Medicine Observation Re-evaluation Note  Megan Manning is a 43 y.o. female, seen on rounds today.  Pt initially presented to the ED for complaints of neuropathy pain Currently, the patient is awaiting group home placement.  Physical Exam  BP 100/87 (BP Location: Right Arm)   Pulse (!) 115   Temp 98.4 F (36.9 C) (Oral)   Resp 16   Ht 5\' 3"  (1.6 m)   Wt (!) 154.7 kg   SpO2 97%   BMI 60.41 kg/m  Physical Exam General: alert, NAD, interactive. Dry cough. ENT; throat clear, normal Cardiac: regular no rub murmur gallop Lungs: CTA Psych: cooperative while I am at bedside Musc: LE symmetric.  Patient has significant obesity of legs.  However, there is no edema.  Fat pads over the top of the feet and the legs are soft and pliable.  No tenderness.  No erythema.  Patient is ambulatory to the bathroom and back with steady gait.  ED Course / MDM  EKG:EKG Interpretation Date/Time:  Monday August 09 2023 22:08:32 EST Ventricular Rate:  127 PR Interval:  128 QRS Duration:  56 QT Interval:  288 QTC Calculation: 418 R Axis:   48  Text Interpretation: Sinus tachycardia Low voltage QRS Nonspecific ST and T wave abnormality Abnormal ECG When compared with ECG of 08-Aug-2023 21:31, PREVIOUS ECG IS PRESENT Confirmed by Rolan Bucco [phone]) on 08/09/2023 10:11:52 PM  I have reviewed the labs performed to date as well as medications administered while in observation.  Recent changes in the last 24 hours include dry cough since yesterday. Sounds like upper airway. Patient has been refusing meds so no recent Claritin or protonix. May be reflux of pst nasal D/c. Will check resp panel since change from yesterday. Reviewed importance of taking meds to treat symptoms with patient, she voiced understanding and agreeing with me as I am at bedside.  Plan  Current plan is for group home placement.    Arby Barrette, MD 08/15/23 1150    Arby Barrette, MD 08/15/23 1154

## 2023-08-15 NOTE — ED Notes (Signed)
Patient refuse 10 am medications

## 2023-08-16 MED ORDER — BENZONATATE 100 MG PO CAPS
100.0000 mg | ORAL_CAPSULE | Freq: Three times a day (TID) | ORAL | Status: DC | PRN
  Administered 2023-09-10 – 2023-09-18 (×2): 100 mg via ORAL
  Filled 2023-08-16 (×2): qty 1

## 2023-08-16 NOTE — ED Notes (Signed)
Pt refusing meds at this time and states, "I'll think about it". Explained what meds are for and pt still stated, "I'll think about it".

## 2023-08-16 NOTE — ED Notes (Signed)
Pt reports c/o of "feeling my heart beat and my heart's beating real fast." Pt requesting to be given tylenol or ibuprofen for her heart and informed those medicines are not for her heart, offered to provide pt with her oral medication for her heart and pt adamantly refuses and states "its not going to help my heart." Pt denies any further needs at this time, pt in NAD, respirations even and unlabored at this time.

## 2023-08-16 NOTE — ED Notes (Addendum)
Pt c/o needing inhaler for her breathing. Pt breathing WNL and pt is in NAD, talking in complete sentences. Educated pt that inhaler will increase her HR as well. Pt still refusing daily meds.

## 2023-08-16 NOTE — ED Notes (Signed)
Asked pt again if she will take her medications, especially d/t her HR. Pt stated, "I'll think about it." With a playful smile on her face.

## 2023-08-16 NOTE — ED Notes (Signed)
Pt had first phone call and now having 2nd phone call

## 2023-08-16 NOTE — ED Provider Notes (Signed)
Emergency Medicine Observation Re-evaluation Note  Megan Manning is a 43 y.o. female, seen on rounds today.  Pt initially presented to the ED for complaints of neuropathy pain Currently, the patient is asleep, no new concerns by nursing staff.  Physical Exam  BP (!) 140/78 (BP Location: Left Arm)   Pulse (!) 107   Temp 99.1 F (37.3 C) (Oral)   Resp 20   Ht 5\' 3"  (1.6 m)   Wt (!) 154.7 kg   SpO2 91%   BMI 60.41 kg/m  Physical Exam General: Asleep, no acute distress Cardiac: Regular rate Lungs: No increased work of breathing Psych: Calm, asleep  ED Course / MDM  EKG:EKG Interpretation Date/Time:  Monday August 09 2023 22:08:32 EST Ventricular Rate:  127 PR Interval:  128 QRS Duration:  56 QT Interval:  288 QTC Calculation: 418 R Axis:   48  Text Interpretation: Sinus tachycardia Low voltage QRS Nonspecific ST and T wave abnormality Abnormal ECG When compared with ECG of 08-Aug-2023 21:31, PREVIOUS ECG IS PRESENT Confirmed by Rolan Bucco [phone]) on 08/09/2023 10:11:52 PM  I have reviewed the labs performed to date as well as medications administered while in observation.  Recent changes in the last 24 hours include patient is medically cleared. She is pending group home placement.  Plan  Current plan is for Group home placement.    Rexford Maus, DO 08/16/23 [phone]

## 2023-08-17 NOTE — ED Notes (Signed)
Pt refusing meds at this time stating, "I'll think about it".

## 2023-08-17 NOTE — ED Notes (Signed)
Pt has been calm and cooperative throughout shift. Pt has taken some of her medications and refused others. No needs voiced at this time, VSS and WNL, NADN, WCM.

## 2023-08-17 NOTE — Progress Notes (Addendum)
11am: CSW sent secure email to Manon Hilding, and Corrie Dandy of [facility] to inform them of patient's refusal to take medications and to inquire about what efforts are being made to locate placement for patient.  8:40am: CSW attempted to reach Delilah at [facility] without success - a voicemail was left requesting a return call.  Edwin Dada, MSW, LCSW Transitions of Care  Clinical Social Worker II [phone]

## 2023-08-17 NOTE — ED Provider Notes (Signed)
Emergency Medicine Observation Re-evaluation Note  Megan Manning is a 43 y.o. female, seen on rounds today.  Pt initially presented to the ED for complaints of neuropathy pain Currently, the patient is sleeping.  Physical Exam  BP (!) 157/63 (BP Location: Right Arm)   Pulse (!) 105   Temp 98.5 F (36.9 C) (Oral)   Resp 19   Ht 5\' 3"  (1.6 m)   Wt (!) 154.7 kg   SpO2 100%   BMI 60.41 kg/m  Physical Exam General: sleeping Cardiac: tachycardia Lungs: clear Psych: cooperative intermittently when awake   ED Course / MDM  EKG:EKG Interpretation Date/Time:  Monday August 09 2023 22:08:32 EST Ventricular Rate:  127 PR Interval:  128 QRS Duration:  56 QT Interval:  288 QTC Calculation: 418 R Axis:   48  Text Interpretation: Sinus tachycardia Low voltage QRS Nonspecific ST and T wave abnormality Abnormal ECG When compared with ECG of 08-Aug-2023 21:31, PREVIOUS ECG IS PRESENT Confirmed by Rolan Bucco [phone]) on 08/09/2023 10:11:52 PM  I have reviewed the labs performed to date as well as medications administered while in observation.  Recent changes in the last 24 hours include pt still routinely refusing meds but yesterday did take some meds 1 time on night shift.  Plan  Current plan is for a SW not on 1/14 reported attempting group home placement.  Reached back out to social work for update.  Still under review but no offers of placement at this time     Gwyneth Sprout, MD 08/17/23 [phone]

## 2023-08-18 NOTE — ED Provider Notes (Signed)
Emergency Medicine Observation Re-evaluation Note  Megan Manning is a 43 y.o. female, seen on rounds today.  Pt initially presented to the ED for complaints of neuropathy pain Currently, the patient is awake, complains of constipation.  Physical Exam  BP (!) 143/75 (BP Location: Right Wrist)   Pulse (!) 103   Temp 99.4 F (37.4 C) (Oral)   Resp 18   Ht 5\' 3"  (1.6 m)   Wt (!) 154.7 kg   SpO2 99%   BMI 60.41 kg/m  Physical Exam General: nad  Cardiac: rrr Lungs: cta Psych: calm  ED Course / MDM  EKG:EKG Interpretation Date/Time:  Monday August 09 2023 22:08:32 EST Ventricular Rate:  127 PR Interval:  128 QRS Duration:  56 QT Interval:  288 QTC Calculation: 418 R Axis:   48  Text Interpretation: Sinus tachycardia Low voltage QRS Nonspecific ST and T wave abnormality Abnormal ECG When compared with ECG of 08-Aug-2023 21:31, PREVIOUS ECG IS PRESENT Confirmed by Rolan Bucco [phone]) on 08/09/2023 10:11:52 PM  I have reviewed the labs performed to date as well as medications administered while in observation.  Recent changes in the last 24 hours include none .  Plan  Current plan is for placement. Discussed with patient importance of taking her prescribed miralax to prevent constipation.    Lonell Grandchild, MD 08/18/23 [phone]

## 2023-08-19 NOTE — ED Notes (Signed)
Pt complaining of ear pain and requesting MD look at ear. Dr. Donnald Garre made aware.

## 2023-08-19 NOTE — ED Notes (Signed)
@  2225: patient report upper chest wall pain - worse when lying flat EKG done. Dr. Donnald Garre made aware. No further orders

## 2023-08-19 NOTE — ED Notes (Signed)
Patient attempting to ambulate to bathroom. Patient reporting shortness of breath. Cough noted. Bedside toilet provided. Pulse ox reading 96% - HR 105-110s. Lungs have expiratory wheeze in lower lobes. Dr. Donnald Garre (green zone provider) made aware of patient case and sbarr given; no further orders at this time. Patient educated on importance of complying with medication regimen - prn albuterol to be given.

## 2023-08-19 NOTE — ED Notes (Signed)
Pt complaining of cough. Offered tessalon per [facility] but pt declined.

## 2023-08-19 NOTE — ED Notes (Signed)
Pt remains calm and cooperative. Denies ibuprofen helping her back pain but pt refusing to try tylenol at this time.

## 2023-08-19 NOTE — ED Notes (Signed)
Pt refusing vitals at this time.

## 2023-08-19 NOTE — ED Notes (Signed)
Pt resting comfortably at this time. Sitter remains within sight of patient. Pt door open for visibility from the nurses station.

## 2023-08-19 NOTE — ED Provider Notes (Signed)
Emergency Medicine Observation Re-evaluation Note  BRITIANY SOCKS is a 42 y.o. female, seen on rounds today.  Pt initially presented to the ED for complaints of neuropathy pain Currently, the patient is resting in bed asleep.  Physical Exam  BP 104/70 (BP Location: Right Wrist)   Pulse (!) 104   Temp 98.6 F (37 C) (Oral)   Resp 18   Ht 5\' 3"  (1.6 m)   Wt (!) 154.7 kg   SpO2 90%   BMI 60.41 kg/m  Physical Exam General: Resting comfortably without acute distress Lungs: Symmetric rise and fall of chest without respiratory distress Psych: No agitation  ED Course / MDM  EKG:EKG Interpretation Date/Time:  Monday August 09 2023 22:08:32 EST Ventricular Rate:  127 PR Interval:  128 QRS Duration:  56 QT Interval:  288 QTC Calculation: 418 R Axis:   48  Text Interpretation: Sinus tachycardia Low voltage QRS Nonspecific ST and T wave abnormality Abnormal ECG When compared with ECG of 08-Aug-2023 21:31, PREVIOUS ECG IS PRESENT Confirmed by Rolan Bucco [phone]) on 08/09/2023 10:11:52 PM  I have reviewed the labs performed to date as well as medications administered while in observation.  Recent changes in the last 24 hours include none reported by nursing.  Plan  Current plan is for awaiting placement.    Freddy Kinne, Canary Brim, MD 08/19/23 [phone]

## 2023-08-19 NOTE — ED Notes (Signed)
Pt up to the shower at this time 

## 2023-08-19 NOTE — ED Notes (Signed)
After shower pt complaining of back pain. Medicated per MAR.

## 2023-08-20 NOTE — ED Notes (Signed)
PT is inquiring about dinner and watching tv.

## 2023-08-20 NOTE — ED Notes (Signed)
PT is c/o body pain and also c/o of not wanting the food offered here. I informed PT that food comes as is and we can't order individual meals unless there is a allergy noted.For the body pain, PRN tylenol will be given

## 2023-08-20 NOTE — ED Provider Notes (Signed)
Emergency Medicine Observation Re-evaluation Note  Megan Manning is a 43 y.o. female, seen on rounds today.  Pt initially presented to the ED for complaints of neuropathy pain Currently, the patient is sleeping but yesterday did complain of cough and congestion and was given her inhaler 1 time.  Patient has not had any hypoxia.  She has had multiple viral panels that have been negative this month.  Still intermittently taking her medication.  Physical Exam  BP (!) 105/51 (BP Location: Right Wrist)   Pulse 94   Temp 98.4 F (36.9 C) (Oral)   Resp (!) 22   Ht 5\' 3"  (1.6 m)   Wt (!) 154.7 kg   SpO2 95%   BMI 60.41 kg/m  Physical Exam General: Sleeping Cardiac: Regular rate Lungs: Clear Psych: Intermittently cooperative but somewhat resistant  ED Course / MDM  EKG:EKG Interpretation Date/Time:  Thursday August 19 2023 22:28:43 EST Ventricular Rate:  101 PR Interval:  120 QRS Duration:  70 QT Interval:  358 QTC Calculation: 464 R Axis:   32  Text Interpretation: Sinus tachycardia Otherwise normal ECG When compared with ECG of 09-Aug-2023 22:08, No significant change was found Confirmed by Dione Booze ([id number]) on 08/20/2023 5:21:10 AM  I have reviewed the labs performed to date as well as medications administered while in observation.  Recent changes in the last 24 hours include no change from the past.  Plan  Current plan is for waiting for group placement.    Gwyneth Sprout, MD 08/20/23 [phone]

## 2023-08-20 NOTE — ED Notes (Signed)
PT c/o having a cough. No cough heard at this time and PT has now went back to sleep

## 2023-08-20 NOTE — ED Notes (Signed)
PT is currently eating lunch.

## 2023-08-20 NOTE — ED Provider Notes (Signed)
Emergency Medicine Observation Re-evaluation Note  Megan Manning is a 43 y.o. female, seen on rounds today.  Pt initially presented to the ED for complaints of from group home, episode agitated behavior. No new c/o this AM.  Physical Exam  BP (!) 105/51 (BP Location: Right Wrist)   Pulse 94   Temp 98.4 F (36.9 C) (Oral)   Resp (!) 22   Ht 1.6 m (5\' 3" )   Wt (!) 154.7 kg   SpO2 95%   BMI 60.41 kg/m  Physical Exam General: no distress.  Cardiac: regular rate.  Lungs: breathing comfortably. Psych: calm.   ED Course / MDM    I have reviewed the labs performed to date as well as medications administered while in observation.  Recent changes in the last 24 hours include ED obs, reassessment.   Plan  Pt in ED awaiting new placement. It appears from record review that patient should have rightly returned to her current home, with guardian and current home pursuing new placement (with atleast 30 days notice being given), if that were the desired plan for patients housing.   Disposition per [facility] team.  It appears LME/guardian are not pursuing placement and responding  to [facility] team inquiries with much diligence or efficiency - it may require TOC team leader involvement to interface with LME/guardian to help prompt more efficient placement.     Cathren Laine, MD 08/20/23 [phone]

## 2023-08-20 NOTE — ED Notes (Signed)
PT is watch tv and waiting for her dinner tray. PT had to have a new dinner tray ordered because she states that she does not like spaghetti. PT requested a cup of mac and cheese and a cheeseburger with a cookie as dessert.

## 2023-08-20 NOTE — Progress Notes (Addendum)
12:20pm: CSW received email from Dollene Primrose at [facility] stating she is submitting an application to [facility] on patient's behalf for placement.  9:15am: CSW received call from Delilah at [facility] who states there is an internal meeting at 10am to discuss plan for patient.   8:55am: CSW has not received any discharge planning updates from [facility] staff regarding new placement for patient.  Edwin Dada, MSW, LCSW Transitions of Care  Clinical Social Worker II [phone]

## 2023-08-20 NOTE — ED Notes (Signed)
PT is c/o a headache at this time. Informed PT that she had PRN tylenol at 0932 and it is scheduled for every 6 hrs.

## 2023-08-20 NOTE — ED Notes (Signed)
PT is sleeping at this time

## 2023-08-20 NOTE — ED Notes (Signed)
PT is currently watching tv.

## 2023-08-20 NOTE — ED Notes (Signed)
Pt ambulated to restroom and then to shower to take a shower with assistance of sitters.

## 2023-08-21 MED ORDER — POTASSIUM CHLORIDE CRYS ER 20 MEQ PO TBCR
20.0000 meq | EXTENDED_RELEASE_TABLET | Freq: Every day | ORAL | Status: AC
  Administered 2023-08-21 – 2023-08-24 (×4): 20 meq via ORAL
  Filled 2023-08-21 (×5): qty 1

## 2023-08-21 MED ORDER — FUROSEMIDE 20 MG PO TABS
20.0000 mg | ORAL_TABLET | Freq: Every day | ORAL | Status: AC
  Administered 2023-08-21 – 2023-08-24 (×4): 20 mg via ORAL
  Filled 2023-08-21 (×5): qty 1

## 2023-08-21 NOTE — ED Notes (Signed)
Educated pt on meds and what each one are for. Pt refused metoprolol and depakote, despite education.

## 2023-08-21 NOTE — ED Notes (Signed)
Attempted to give pt her PM meds. Pt refused meds after scanning and removing meds from packaging. Meds wasted in stericycle.Marland Kitchen

## 2023-08-21 NOTE — ED Provider Notes (Signed)
Emergency Medicine Observation Re-evaluation Note  Megan Manning is a 43 y.o. female, seen on rounds today.  Pt initially presented to the ED for complaints of neuropathy pain Currently, the patient is resting comfortably.  She indicates that she is having some swelling in the leg, cramping pain.  She used to be on Lasix at some point.  She is supposed to be wearing compression stockings.  Physical Exam  BP 132/72 (BP Location: Right Wrist)   Pulse 95   Temp 98.6 F (37 C) (Oral)   Resp 18   Ht 5\' 3"  (1.6 m)   Wt (!) 154.7 kg   SpO2 95%   BMI 60.41 kg/m  Physical Exam General: No acute distress Cardiac: Regular rate Lungs: No respiratory distress Psych: Currently calm Lower extremity: Bilateral lower extremity swelling noted, but no pitting appreciated  ED Course / MDM  EKG:EKG Interpretation Date/Time:  Thursday August 19 2023 22:28:43 EST Ventricular Rate:  101 PR Interval:  120 QRS Duration:  70 QT Interval:  358 QTC Calculation: 464 R Axis:   32  Text Interpretation: Sinus tachycardia Otherwise normal ECG When compared with ECG of 09-Aug-2023 22:08, No significant change was found Confirmed by Dione Booze ([id number]) on 08/20/2023 5:21:10 AM  I have reviewed the labs performed to date as well as medications administered while in observation.  Recent changes in the last 24 hours include -no new changes.  Patient is complaining of leg swelling.  She had a BNP last year which was normal.  Echocardiogram had shown some valvular abnormality.  She has had couple of D-dimers this visit which are negative.  Potassium, renal function, liver function is normal.  I did notice that in December 2024, she was getting 20 mg of Lasix. Although I do not think she needs long-term Lasix, for this acute event we will put her on 5 days of Lasix along with potassium.   Plan  Current plan is for continuing to hold patient for placement.    Derwood Kaplan, MD 08/21/23 1235

## 2023-08-22 NOTE — ED Notes (Signed)
Pt states, "I don't feel good." Offered ibuprofen. Pt refused.

## 2023-08-22 NOTE — ED Notes (Signed)
Pt c/o R ear ache. PRN ibuprofen already administered.

## 2023-08-22 NOTE — ED Notes (Addendum)
Pt now c/o headache. Willing to take prn ibuprofen at this time

## 2023-08-22 NOTE — ED Notes (Signed)
Pt w/ multiple complaints, pain, generalized weakness, malaise, nausea. PRN tylenol given earlier and prn zofran given.

## 2023-08-22 NOTE — ED Provider Notes (Signed)
Emergency Medicine Observation Re-evaluation Note  Megan Manning is a 43 y.o. female, seen on rounds today.  Pt initially presented to the ED for complaints of neuropathy pain Currently, the patient is awake, indicating that she thinks she might be getting sick.  Patient requested Tylenol earlier which was provided by nurse.  Patient has not had any fevers.  She denies any URI-like symptoms, states that she just feels weak.  Physical Exam  BP (!) 135/91 (BP Location: Right Wrist)   Pulse 100   Temp 98.5 F (36.9 C) (Oral)   Resp 20   Ht 5\' 3"  (1.6 m)   Wt (!) 154.7 kg   SpO2 95%   BMI 60.41 kg/m  Physical Exam General: No acute distress Cardiac: Regular rate Lungs: No respiratory distress Psych: Currently calm  ED Course / MDM  EKG:EKG Interpretation Date/Time:  Thursday August 19 2023 22:28:43 EST Ventricular Rate:  101 PR Interval:  120 QRS Duration:  70 QT Interval:  358 QTC Calculation: 464 R Axis:   32  Text Interpretation: Sinus tachycardia Otherwise normal ECG When compared with ECG of 09-Aug-2023 22:08, No significant change was found Confirmed by Dione Booze ([id number]) on 08/20/2023 5:21:10 AM  I have reviewed the labs performed to date as well as medications administered while in observation.  Recent changes in the last 24 hours include -no new changes.  At this time I do not think we need to order flu/COVID. Nursing staff made aware that if patient spikes a fever or starts having congestion URI to let us know.  Plan  Current plan is for holding patient for placement.    Derwood Kaplan, MD 08/22/23 1141

## 2023-08-23 NOTE — ED Provider Notes (Signed)
Emergency Medicine Observation Re-evaluation Note  Megan Manning is a 43 y.o. female, seen on rounds today.  Pt initially presented to the ED for complaints of neuropathy pain Currently, the patient is sleeping but yesterday was c/o of feeling weak.  NO significant issues overnight except for c/o of ear pain .  Physical Exam  BP (!) 123/98 (BP Location: Left Wrist)   Pulse (!) 108   Temp 99.1 F (37.3 C) (Oral)   Resp 18   Ht 5\' 3"  (1.6 m)   Wt (!) 154.7 kg   SpO2 96%   BMI 60.41 kg/m  Physical Exam General: sleeping in NAD Cardiac: tachycardia Lungs: no distress Psych: calm and cooperative when awake  ED Course / MDM  EKG:EKG Interpretation Date/Time:  Thursday August 19 2023 22:28:43 EST Ventricular Rate:  101 PR Interval:  120 QRS Duration:  70 QT Interval:  358 QTC Calculation: 464 R Axis:   32  Text Interpretation: Sinus tachycardia Otherwise normal ECG When compared with ECG of 09-Aug-2023 22:08, No significant change was found Confirmed by Dione Booze ([id number]) on 08/20/2023 5:21:10 AM  I have reviewed the labs performed to date as well as medications administered while in observation.  Recent changes in the last 24 hours include none.  Plan  Current plan is for still awaiting placement.    Gwyneth Sprout, MD 08/23/23 1037

## 2023-08-23 NOTE — Progress Notes (Signed)
CSW spoke with Delilah at [facility] who states [employer] MD is requesting patient be placed on an injectable to help with behaviors and to aide in locating placement. Delilah states patient has been denied at facilities due to her history of bizarre behaviors.  CSW spoke with MD to inform her of request.  Edwin Dada, MSW, LCSW Transitions of Care  Clinical Social Worker II [phone]

## 2023-08-23 NOTE — ED Notes (Signed)
Patient ambulated to the restroom

## 2023-08-23 NOTE — ED Notes (Signed)
Patient refuse Calcium Carbonate, Divalproex, Loratadine and Metoprolol Succinate

## 2023-08-24 ENCOUNTER — Emergency Department (HOSPITAL_COMMUNITY): Payer: MEDICAID

## 2023-08-24 LAB — CBC WITH DIFFERENTIAL/PLATELET
Abs Immature Granulocytes: 0.03 10*3/uL (ref 0.00–0.07)
Basophils Absolute: 0 10*3/uL (ref 0.0–0.1)
Basophils Relative: 1 %
Eosinophils Absolute: 0.3 10*3/uL (ref 0.0–0.5)
Eosinophils Relative: 5 %
HCT: 36.9 % (ref 36.0–46.0)
Hemoglobin: 11.2 g/dL — ABNORMAL LOW (ref 12.0–15.0)
Immature Granulocytes: 1 %
Lymphocytes Relative: 39 %
Lymphs Abs: 2.4 10*3/uL (ref 0.7–4.0)
MCH: 27.3 pg (ref 26.0–34.0)
MCHC: 30.4 g/dL (ref 30.0–36.0)
MCV: 89.8 fL (ref 80.0–100.0)
Monocytes Absolute: 0.5 10*3/uL (ref 0.1–1.0)
Monocytes Relative: 7 %
Neutro Abs: 3.1 10*3/uL (ref 1.7–7.7)
Neutrophils Relative %: 47 %
Platelets: 278 10*3/uL (ref 150–400)
RBC: 4.11 MIL/uL (ref 3.87–5.11)
RDW: 13.7 % (ref 11.5–15.5)
WBC: 6.3 10*3/uL (ref 4.0–10.5)
nRBC: 0 % (ref 0.0–0.2)

## 2023-08-24 LAB — BASIC METABOLIC PANEL
Anion gap: 9 (ref 5–15)
BUN: 11 mg/dL (ref 6–20)
CO2: 24 mmol/L (ref 22–32)
Calcium: 8.6 mg/dL — ABNORMAL LOW (ref 8.9–10.3)
Chloride: 107 mmol/L (ref 98–111)
Creatinine, Ser: 0.75 mg/dL (ref 0.44–1.00)
GFR, Estimated: >60 mL/min (ref >60)
Glucose, Bld: 106 mg/dL — ABNORMAL HIGH (ref 70–99)
Potassium: 4.5 mmol/L (ref 3.5–5.1)
Sodium: 140 mmol/L (ref 135–145)

## 2023-08-24 LAB — RESP PANEL BY RT-PCR (RSV, FLU A&B, COVID)  RVPGX2
Influenza A by PCR: NEGATIVE
Influenza B by PCR: NEGATIVE
Resp Syncytial Virus by PCR: NEGATIVE
SARS Coronavirus 2 by RT PCR: NEGATIVE

## 2023-08-24 MED ORDER — LIDOCAINE VISCOUS HCL 2 % MT SOLN
15.0000 mL | Freq: Once | OROMUCOSAL | Status: AC
  Administered 2023-08-24: 15 mL via OROMUCOSAL
  Filled 2023-08-24: qty 15

## 2023-08-24 NOTE — Progress Notes (Signed)
Pt went for scans of the chest 4:20pm.

## 2023-08-24 NOTE — ED Provider Notes (Signed)
Emergency Medicine Observation Re-evaluation Note  Megan Manning is a 43 y.o. female, seen on rounds today.  Pt initially presented to the ED for complaints of neuropathy pain Currently, the patient is resting comfortably, occasionally coughing. Was complaining of a sore throat today.  Physical Exam  BP 123/65   Pulse (!) 109   Temp 98.8 F (37.1 C) (Oral)   Resp 18   Ht 5\' 3"  (1.6 m)   Wt (!) 154.7 kg   SpO2 96%   BMI 60.41 kg/m  Physical Exam General: well appearing , in NAD HEENT: Very minimal to no oropharyngeal erythema, no bulging of the tonsillar pillars Cardiac: mildly tachycardic Lungs: CTAB Psych: no agitation  ED Course / MDM  EKG:EKG Interpretation Date/Time:  Thursday August 19 2023 22:28:43 EST Ventricular Rate:  101 PR Interval:  120 QRS Duration:  70 QT Interval:  358 QTC Calculation: 464 R Axis:   32  Text Interpretation: Sinus tachycardia Otherwise normal ECG When compared with ECG of 09-Aug-2023 22:08, No significant change was found Confirmed by Dione Booze ([id number]) on 08/20/2023 5:21:10 AM  I have reviewed the labs performed to date as well as medications administered while in observation.  Recent changes in the last 24 hours include pt complaining of sore throat, coughing, will retest for covid/flu and check screening labs. Viscous lidocaine for sore throat.  Plan  Current plan is for Placement.    Ernie Avena, MD 08/24/23 [phone]

## 2023-08-25 MED ORDER — HALOPERIDOL LACTATE 5 MG/ML IJ SOLN
5.0000 mg | Freq: Once | INTRAMUSCULAR | Status: AC
  Administered 2023-08-25: 5 mg via INTRAMUSCULAR
  Filled 2023-08-25: qty 1

## 2023-08-25 MED ORDER — MAGNESIUM HYDROXIDE 400 MG/5ML PO SUSP
15.0000 mL | Freq: Every day | ORAL | Status: DC | PRN
  Administered 2023-08-25 – 2023-09-22 (×2): 15 mL via ORAL
  Filled 2023-08-25 (×2): qty 30

## 2023-08-25 NOTE — ED Notes (Signed)
Now patient is requesting "another popsicle." This RN was unaware that the patient has already been given 2 popsicles today in addition to the graham crackers earlier mentioned. Pt informed that she will be given no more snacks and if she is hungry she can eat the dinner that is provided to her.

## 2023-08-25 NOTE — ED Notes (Addendum)
Pt requesting to order her own meal for dinner. Pt has refused two meals today and has been given 8 graham crackers by sitter for snacks. Pt states, "If I can't order what I want, I ain't gonna eat a damn thing." Pt instructed that we do not get to order whatever we want per policy and that she can eat the meal that is provided to her. Pt then asked for additional graham crackers which were not provided to her since snack time is over.

## 2023-08-25 NOTE — ED Notes (Signed)
MD at bedside assessing patient. Pt now willing to stay per MD.

## 2023-08-25 NOTE — ED Notes (Signed)
Pt put mask in her mouth, began gagging and choking. Got mask out with finger swipe in mouth.

## 2023-08-25 NOTE — ED Notes (Signed)
Pt is requesting to leave. Called SW, pt is voluntary at this time and staff is unable to stop her from leaving if she tries. Legal guardian contacted and made aware that patient is requesting to leave and that we would not be able to stop her if she does leave.

## 2023-08-25 NOTE — ED Provider Notes (Signed)
I was notified by RN that patient threatening to leave and "jump in front of traffic".  I evaluated the patient at bedside and calmly was able to walk her back to her room.  She states that she is frustrated that she has been here for so long and just wants to leave but states if she does leave "she will jump in front of traffic.  She denies SI but is unable to tell me why she would jump in front of traffic.  She denies any hallucinations.  Patient is also complaining of sore throat with cough and congestion and some stomach upset.  She states this has been going on for the last 3 to 4 days.  She did have a COVID swab yesterday that was negative and has viscous lidocaine ordered as well as cough medication and GI cocktail.  Patient was recommended continued symptomatic management.  The patient was initially agreeable to stay and then again started threatening to leave and IVC was placed.  Psychiatry will be consulted.   Elayne Snare K, DO 08/25/23 1756

## 2023-08-25 NOTE — ED Provider Notes (Signed)
Emergency Medicine Observation Re-evaluation Note  Megan Manning is a 43 y.o. female, seen on rounds today.  Pt initially presented to the ED for complaints of neuropathy pain Patient sleeping.  Physical Exam  BP (!) 161/80 (BP Location: Right Arm)   Pulse 84   Temp 99.3 F (37.4 C) (Oral)   Resp 19   Ht 5\' 3"  (1.6 m)   Wt (!) 154.7 kg   SpO2 94%   BMI 60.41 kg/m  Physical Exam General: well appearing , in NAD Lungs: bilateral chest rise, no tachypnea.  Psych: no agitation  ED Course / MDM  EKG:EKG Interpretation Date/Time:  Thursday August 19 2023 22:28:43 EST Ventricular Rate:  101 PR Interval:  120 QRS Duration:  70 QT Interval:  358 QTC Calculation: 464 R Axis:   32  Text Interpretation: Sinus tachycardia Otherwise normal ECG When compared with ECG of 09-Aug-2023 22:08, No significant change was found Confirmed by Dione Booze ([id number]) on 08/20/2023 5:21:10 AM  I have reviewed the labs performed to date as well as medications administered while in observation.  Recent changes in the last 24 hours include pt complaining of sore throat, coughing, will retest for covid/flu and check screening labs. Viscous lidocaine for sore throat.  Plan  Current plan is for Placement.       Melene Plan, DO 08/25/23 [phone]

## 2023-08-25 NOTE — ED Notes (Signed)
Pt talking on the phone, stating, "Someone is gonna show me the way out of here, or they are gonna get spit on!" Pt encouraged repeatedly to return to her room.

## 2023-08-25 NOTE — ED Notes (Signed)
IVC paperwork complete and in purple zone, expires 09/01/23, case # [id number]

## 2023-08-25 NOTE — ED Notes (Signed)
Pt walking around unit, stating, "I'm gonna spit on you if you come near me."

## 2023-08-25 NOTE — ED Notes (Signed)
Pt refusing meds except Claritin, Protonix, and Miralax. Educated on importance of taking prescribed meds, pt continues to refuse.

## 2023-08-25 NOTE — ED Notes (Signed)
Pt calling out for nurse to come to her room to talk with her. Pt states, "Why did you call the doctor on me? I just wanna leave and you called her in here." Pt denies SI, stating, "All I wanted to do was get [place] here." Dinner tray brought to room by dietary, and patient refusing tray again. Reminded patient that she was hungry earlier and that she will not get any more snacks.  Pt states she is hungry but doesn't want what was brought to her. Pt has not seen what kind of food was brought on dinner tray, continues to say she doesn't want it.

## 2023-08-25 NOTE — ED Notes (Signed)
Per MD, secretary currently filing IVC.

## 2023-08-25 NOTE — ED Notes (Signed)
Pt assisted back to room

## 2023-08-25 NOTE — ED Notes (Signed)
Pt refusing to go back to room. [employer] officer talking to patient and attempting to redirect patient back to room. Pt states, "I'm just gonna leave so someone can kill me. Someone will hit me with a car." MD made aware. MD coming to see patient.

## 2023-08-25 NOTE — ED Notes (Signed)
Pt requesting meds for sore throat. Requested Chloraseptic spray from pharmacy.

## 2023-08-25 NOTE — ED Notes (Signed)
Pt remains at nursing station stating that she is going to leave, stating, "I been here too long. I am getting up outta here. I'm leaving." Pt is calm, but tearful. Pt calling friends/family members requesting for them to come pick her up. Pt states, "I'm walkin' out. Nobody can stop me." Pt encouraged to stay here where she has a warm bed to sleep in, food to eat, etc. Pt states "I don't care about none of that. I'm gonna stay on the street."

## 2023-08-25 NOTE — ED Notes (Signed)
Pt taking shower.

## 2023-08-25 NOTE — ED Notes (Signed)
IM meds given per [facility] with assistance from security and [employer].

## 2023-08-25 NOTE — ED Notes (Signed)
Pt screaming at [facility] officers, "If you put your hands on me, you gonna be dead." Pt refusing to go back to room. Pt crying next to nursing station. Pt attempting to leave. MD made aware.

## 2023-08-25 NOTE — Progress Notes (Signed)
CSW received a message from patients nurse that patient is trying to leave the hospital. Patients guardian, Misty Stanley [phone] with [employer] was contacted. CSW was told Misty Stanley has left for the day, but they would give her and the supervisor a message.

## 2023-08-25 NOTE — ED Notes (Signed)
Pt refused breakfast tray, stating, "I don't eat breakfast."

## 2023-08-25 NOTE — ED Notes (Signed)
Pt at nursing station using phone to call grandmother. Pt stating, "I'm about to leave." Pt encouraged to return to room. No evidence of understanding expressed.

## 2023-08-25 NOTE — ED Notes (Signed)
Pt refused lunch tray

## 2023-08-26 NOTE — Consult Note (Addendum)
[name] is a 43 year old African-American female with a past psychiatric history significant for borderline personality disorder, intellectual disability, PICA, and adjustment disorder, who currently resides in the [facility] emergency department as a border while awaiting group home placement, due to losing previous living arrangements in an AFL from unsafe and unmanageable behaviors, resulting in the need for a higher level of care.   Patient originally seen at the [facility] 07/15/23 by Dr. Theodis Aguas, MD for the initial chief complaint of ingesting foreign objects after becoming upset at her ALF, and upon completion of evaluation, patient was sent to the [facility] emergency department for emergency medical clearance, due to endorsements of ingestion of foreign body. Patient was then medically cleared and sent back to the [facility], where she was seen by Mr. Sindy Guadeloupe and psychiatrically cleared for return back to AFL, who after returning back to her AFL endorsed ingestion of swallowing bracelets, which led to returning back to the [facility] briefly, before being directed back again to the [facility] emergency department for medical clearance, due to swallowing foreign object. Psychiatry was then consulted at the [facility] emergency department.   While at [facility] ED for psychiatric consultation after medical clearance, patient was seen by [name] Dr. Arther Dames on 07/16/23 for the initial chief complaint of ingesting foreign objects after becoming upset at her ALF, and Dr. Arther Dames psychiatrically cleared the patient, on the grounds of the patient does not meet inpatient psychiatric criteria and/or holding criteria (I.e., history of IDD, expected behavioral disturbances as a part of the patient's chronic illness course of IDD).   Psychiatry was then shortly reconsulted due to a recrudescence of intermittent periods of increased agitations and continued chronic behavior of attempting to try to swallow  foreign objects such as EKG wires and Styrofoam cups, who upon reconsultation with [name], NP, was ultimately repsychiatrically cleared (endorsed reconsultation not needed or warranted), due to largely the same conclusion, that the patient's behaviors and incidence of behavioral disturbances are not expected to change, as they are expected as a part of the patient's chronic illness course of IDD, and that the recommendations would be for tighter and more strict adherence to safety measures, while awaiting return to AFL.  Patient unfortunately later that day lost AFL placement, and was subsequently transition to boarder status while awaiting new placement disposition.  TOC then consulted.  To date, psychiatry has now been reconsulted 07/27/23 for increased behavioral disturbances, continued attempts to swallow foreign objects, and now refusal to take psychiatric medications scheduled. CCA performed by psychiatry team, and upon evaluation, patient continues to endorse no SI/HI/AVH.   Discussed with Dr. Maple Hudson from primary EDP team that the recommendations remain the same, strict adherence to safety measures need to be implemented listed below.  Patient continues to not meet inpatient criteria at this time.  There are no medication changes that are felt the patient would benefit from at this time.  Spoke with Dr. Enedina Finner who agrees with repsychiatric clearance, as well as the additional recommendations listed below.  08/26/2023- Psychiatry re-consultation for behavioral incident over the night  Per Dr. Theresia Lo, DO  I was notified by RN that patient threatening to leave and "jump in front of traffic".  I evaluated the patient at bedside and calmly was able to walk her back to her room.  She states that she is frustrated that she has been here for so long and just wants to leave but states if she does leave "she will jump  in front of traffic.  She denies SI but is unable to tell me why she would jump in  front of traffic.  She denies any hallucinations.  Patient is also complaining of sore throat with cough and congestion and some stomach upset.  She states this has been going on for the last 3 to 4 days.  She did have a COVID swab yesterday that was negative and has viscous lidocaine ordered as well as cough medication and GI cocktail.  Patient was recommended continued symptomatic management.  The patient was initially agreeable to stay and then again started threatening to leave and IVC was placed.  Psychiatry will be consulted   Patient seen today at the [facility] emergency department for face-to-face psychiatric re-evaluation.  Upon evaluation, like previously endorsed yesterday with Dr. Theresia Lo, patient expresses frustrations about being held in the emergency department for so long, but today does not endorse any suicidal and/or homicidal ideations. Patient additionally upon chart review has also since calmed down, no behavioral incidents reported from over the night, outside of the events that transpired which led to reconsultation.  Discussed with Dr. Jearld Fenton from primary EDP team that as a part of the patient's chronic illness course of intellectual disability, in combination with the patient's psychiatric condition of borderline personality disorder, behavioral incidents are expected to at times become appreciable, but that this does not necessarily warrant additional measures to be taken, but rather a continuation of current strict adherence to safety measures and current medication regimen already in place. Additionally discussed that patient continues to not meet inpatient criteria, as well as after review of the patient's medication regimen, and the events that transpired, medication changes continue to not feel to be of benefit at this time.  Spoke with Dr. Enedina Finner who agrees with repsychiatric clearance, as well as the additional recommendations listed below.  Recommendations- Cleared  psychiatrically for boarding status  -Recommend finger foods only diet -Recommend strict one-to-one safety sitter, to be at arms length at all times -Recommend mouth checks for medication administration -Recommend safe environment with strict precautions -Recommend inventory of supplies utilized during patient encounters -Recommend continue current medication regimen

## 2023-08-26 NOTE — ED Notes (Addendum)
Pt still sleeping. Rise and fall of chest noted. Will give meds when pt wakes up.

## 2023-08-26 NOTE — ED Notes (Signed)
Pt refusing all meds at this time. Notified EDP d/t HR 113 and pt refusing metoprolol

## 2023-08-26 NOTE — ED Notes (Signed)
Pt c/o malaise and states, "I don't feel good." When asked what doesn't feel good pt states, "Everything". Notified EDP. Provided reassurance and active listening.

## 2023-08-26 NOTE — ED Provider Notes (Signed)
Emergency Medicine Observation Re-evaluation Note  Megan Manning is a 43 y.o. female, seen on rounds today.  Pt initially presented to the ED for complaints of neuropathy pain Currently, the patient is resting.  Physical Exam  BP (!) 131/111   Pulse (!) 108   Temp 97.7 F (36.5 C) (Oral)   Resp 18   Ht 5\' 3"  (1.6 m)   Wt (!) 154.7 kg   SpO2 93%   BMI 60.41 kg/m  Physical Exam General: no acute distress Cardiac: well-perfused Lungs: no resp distress Psych: calm, cooperative  ED Course / MDM  EKG:EKG Interpretation Date/Time:  Thursday August 19 2023 22:28:43 EST Ventricular Rate:  101 PR Interval:  120 QRS Duration:  70 QT Interval:  358 QTC Calculation: 464 R Axis:   32  Text Interpretation: Sinus tachycardia Otherwise normal ECG When compared with ECG of 09-Aug-2023 22:08, No significant change was found Confirmed by Dione Booze ([id number]) on 08/20/2023 5:21:10 AM  I have reviewed the labs performed to date as well as medications administered while in observation.  Recent changes in the last 24 hours include had episode of SI yesterday and psychiatry was consulted. Also tried to swallow a mask last night, which the RN removed with a finger sweep of the mouth.   Plan  Current plan is for psychiatry evaluation this morning.     Loetta Rough, MD 08/26/23 [phone]

## 2023-08-27 NOTE — Progress Notes (Signed)
CSW spoke with Delilah at [facility] to provide her with updates. Delilah states patient's care manager will follow up with Nathaniel Man to determine where patient is on the waiting list.   Edwin Dada, MSW, LCSW Transitions of Care  Clinical Social Worker II [phone]

## 2023-08-27 NOTE — ED Provider Notes (Signed)
Emergency Medicine Observation Re-evaluation Note  Megan Manning is a 43 y.o. female, seen on rounds today.  Pt initially presented to the ED for complaints of neuropathy pain Currently, the patient is resting comfortably in her exam bed eating breakfast.  Physical Exam  BP (!) 120/90 (BP Location: Right Wrist)   Pulse (!) 119   Temp 98.8 F (37.1 C) (Oral)   Resp 18   Ht 5\' 3"  (1.6 m)   Wt (!) 154.7 kg   SpO2 93%   BMI 60.41 kg/m  Physical Exam General: Resting eating breakfast without acute agitation Cardiac: No murmur on exam Lungs: Clear bilaterally on auscultation Psych: No agitation at this time  ED Course / MDM  EKG:EKG Interpretation Date/Time:  Thursday August 19 2023 22:28:43 EST Ventricular Rate:  101 PR Interval:  120 QRS Duration:  70 QT Interval:  358 QTC Calculation: 464 R Axis:   32  Text Interpretation: Sinus tachycardia Otherwise normal ECG When compared with ECG of 09-Aug-2023 22:08, No significant change was found Confirmed by Dione Booze ([id number]) on 08/20/2023 5:21:10 AM  I have reviewed the labs performed to date as well as medications administered while in observation.  Recent changes in the last 24 hours include patient currently under IVC but psychiatric note yesterday said she is psychiatric clear, will clarify if they would like [name] to resend IVC or keep in place to ensure medication management and a sitter is present.  Plan  Current plan is for awaiting TOC disposition now that she is again psychiatrically clear at this time.   10:11 AM Spoke to the mental health team and social work team and they would like her to remain IVC at this time.  Will not resend at and she remained under IVC.  Per their description, she was deemed incompetent by the state and has a guardian so she will not be able to leave even if she is not under IVC; however per the recommendations, she will be under IVC.    Joevanni Roddey, Canary Brim, MD 08/27/23 1013

## 2023-08-27 NOTE — ED Notes (Addendum)
Pt c/o R arm and leg numbness. Notified EDP. NIHSS inconsistent. Pt uncooperative w/ assessment. Grips equal bilaterally. Some weakness to R leg d/t chronic pain, but no weakness noted when asking pt to push against my hands as if they are gas pedals. Pt in NAD, will continue to monitor.

## 2023-08-27 NOTE — ED Notes (Signed)
IVC is current

## 2023-08-27 NOTE — ED Notes (Signed)
Pt now says she is hurting. Offered tylenol. Pt refused.

## 2023-08-27 NOTE — ED Notes (Signed)
Pt w/ no complaints at this time.

## 2023-08-27 NOTE — ED Notes (Addendum)
Pt w/ multiple complaints: dizziness, headache, weak all over, "when was my urine checked last?". Pt exhibiting attention-seeking behaviors. Will continue to monitor. Pt refusing tylenol.

## 2023-08-27 NOTE — ED Notes (Signed)
Pt now talking about, "I've had a mini stroke" "I had cataract surgery".

## 2023-08-28 ENCOUNTER — Emergency Department (HOSPITAL_COMMUNITY): Payer: MEDICAID

## 2023-08-28 NOTE — ED Notes (Signed)
This RN went to check the patient's vitals and give her, her 10am meds. The patient allowed this nurse to take her vitals but refused to take any medications at this time. The pt also stated she did not want anything for lunch. The pt refused breakfast as well and this RN told the patient she would still have the kitchen send her something incase she decided in a little bit she was hungry. The pt does not like house trays and wants to order what she wants but we were told to stay consistent and only send house trays to the patient, the patient understands but is not happy about the situation. Will continue to monitor.

## 2023-08-28 NOTE — ED Notes (Signed)
 Patient transported to MRI

## 2023-08-28 NOTE — ED Notes (Addendum)
PT is watching tv, there is no sitter bedside. Sitting service called and notified. Response is that ED is supposed to cover her.

## 2023-08-28 NOTE — ED Provider Notes (Signed)
Emergency Medicine Observation Re-evaluation Note  Megan Manning is a 43 y.o. female, seen on rounds today.  Pt initially presented to the ED for complaints of neuropathy pain Currently, the patient is sleeping.  Physical Exam  BP 124/82 (BP Location: Right Arm)   Pulse (!) 116   Temp 98.4 F (36.9 C) (Oral)   Resp 20   Ht 5\' 3"  (1.6 m)   Wt (!) 154.7 kg   SpO2 92%   BMI 60.41 kg/m  Physical Exam General: Sleeping thin adult female  ED Course / MDM  EKG:EKG Interpretation Date/Time:  Thursday August 19 2023 22:28:43 EST Ventricular Rate:  101 PR Interval:  120 QRS Duration:  70 QT Interval:  358 QTC Calculation: 464 R Axis:   32  Text Interpretation: Sinus tachycardia Otherwise normal ECG When compared with ECG of 09-Aug-2023 22:08, No significant change was found Confirmed by Dione Booze ([id number]) on 08/20/2023 5:21:10 AM  I have reviewed the labs performed to date as well as medications administered while in observation.  Recent changes in the last 24 hours include no noted events from nursing.  Plan  Current plan is for ongoing efforts for placement to continue.    Gerhard Munch, MD 08/28/23 1134

## 2023-08-28 NOTE — ED Notes (Signed)
PT c/o of her chest hurting at this time.

## 2023-08-28 NOTE — ED Notes (Signed)
PT up and requesting ice cream. I offered her the lunch tray she refused earlier instead. PT declined. I informed PT that she could have ice cream at 3pm snack time. PT is in agreement.

## 2023-08-28 NOTE — ED Notes (Signed)
PT is asleep at this time 

## 2023-08-28 NOTE — ED Notes (Signed)
 ED Provider at bedside.

## 2023-08-28 NOTE — ED Notes (Signed)
PT is c/o chest pain.

## 2023-08-28 NOTE — ED Notes (Signed)
Pt continues to c/o to staff about feeling numbness in right arm and leg; Pt asked to have EDP check her; EDP notified of patient's request-[name],RN

## 2023-08-28 NOTE — ED Notes (Signed)
IVC CURRENT EXP 2/5

## 2023-08-28 NOTE — ED Notes (Signed)
PT made a phone call.

## 2023-08-28 NOTE — ED Notes (Signed)
PT is still refusing her lunch. Lunch tray was set aside in case she changes her mind and wants to eat it later.

## 2023-08-29 LAB — URINALYSIS, ROUTINE W REFLEX MICROSCOPIC
Bilirubin Urine: NEGATIVE
Glucose, UA: NEGATIVE mg/dL
Hgb urine dipstick: NEGATIVE
Ketones, ur: NEGATIVE mg/dL
Leukocytes,Ua: NEGATIVE
Nitrite: NEGATIVE
Protein, ur: NEGATIVE mg/dL
Specific Gravity, Urine: 1.021 (ref 1.005–1.030)
pH: 5 (ref 5.0–8.0)

## 2023-08-29 NOTE — ED Notes (Signed)
Pt refused dinner meal tray at this time.

## 2023-08-29 NOTE — ED Provider Notes (Signed)
Emergency Medicine Observation Re-evaluation Note  Megan Manning is a 43 y.o. female, seen on rounds today.  Pt initially presented to the ED for complaints of being sent to ED for evaluation by ALF/group home (hx IDD, hx personality disorder,hx behavioral issues). Pt has been psychiatrically clear and is awaiting TOC placement. No new c/o this AM.   Physical Exam  BP 111/84 (BP Location: Right Arm)   Pulse 93   Temp 98.2 F (36.8 C) (Oral)   Resp 18   Ht 1.6 m (5\' 3" )   Wt (!) 154.7 kg   SpO2 96%   BMI 60.41 kg/m  Physical Exam General: resting.  Cardiac: regular rate.  Lungs: breathing comfortably. Psych: resting, calm.   ED Course / MDM    I have reviewed the labs performed to date as well as medications administered while in observation.  Recent changes in the last 24 hours include ED obs, reassessment.   Plan  Current plan is for TOC placement.  On chart review, notes made of ?referral to [facility].  From prior experience, waiting list for [facility] has been >> 1 year, as such, that does not seen a reasonable discharge.  Given time boarding in ED already, it appears TOC and TOC team leaders will need to more urgently engage with [facility] leadership tomorrow to discuss a plan for expedient placement of patient (as continued prolonged ED boarding is not in patients best interest).      Cathren Laine, MD 08/29/23 [phone]

## 2023-08-29 NOTE — ED Notes (Signed)
IVC CURRENT

## 2023-08-30 LAB — RESP PANEL BY RT-PCR (RSV, FLU A&B, COVID)  RVPGX2
Influenza A by PCR: NEGATIVE
Influenza B by PCR: NEGATIVE
Resp Syncytial Virus by PCR: NEGATIVE
SARS Coronavirus 2 by RT PCR: NEGATIVE

## 2023-08-30 MED ORDER — POLYETHYLENE GLYCOL 3350 17 G PO PACK
17.0000 g | PACK | Freq: Two times a day (BID) | ORAL | Status: DC
  Administered 2023-08-30 – 2023-10-10 (×15): 17 g via ORAL
  Filled 2023-08-30 (×39): qty 1

## 2023-08-30 NOTE — Progress Notes (Addendum)
CSW spoke with Delilah at [facility] who states patient is not yet on the [facility] waiting list but her care coordinator Toni Amend) plans to get her on the list to hopefully open up additional ICF placement options. Delilah states that patient's behaviors at previous placements have been a major barrier to obtaining new placement.   CSW received lengthy document from Dollene Primrose of [place] showing attempts to obtain new placement for patient. Patient has been denied at 73 facilities across the state and remains under review by only two - [facility] and [facility].  Edwin Dada, MSW, LCSW Transitions of Care  Clinical Social Worker II [phone]

## 2023-08-30 NOTE — ED Notes (Signed)
Patient declining scheduled night time meds. RN notified the purpose of these medications. Patient continued to refuse and stated which medications she would take.

## 2023-08-30 NOTE — ED Provider Notes (Signed)
Emergency Medicine Observation Re-evaluation Note  Megan Manning is a 43 y.o. female, seen on rounds today.  Pt initially presented to the ED for complaints of neuropathy pain and was brought in for evaluation by her group home Currently, the patient is complaining of constipation for several days and some mild abdominal pain.  No abdominal surgeries.  No nausea or vomiting or fevers.  Also complaining of congestion and a headache and a cough.  Physical Exam  BP (!) 120/59 (BP Location: Right Arm)   Pulse 92   Temp 98.6 F (37 C) (Oral)   Resp 20   Ht 5\' 3"  (1.6 m)   Wt (!) 154.7 kg   SpO2 94%   BMI 60.41 kg/m  Physical Exam General: Sitting in stretcher in no acute distress Lungs: Normal work of breathing Abdomen: No significant distention.  No tenderness to palpation. Psych: Calm and cooperative  ED Course / MDM  EKG:EKG Interpretation Date/Time:  Thursday August 19 2023 22:28:43 EST Ventricular Rate:  101 PR Interval:  120 QRS Duration:  70 QT Interval:  358 QTC Calculation: 464 R Axis:   32  Text Interpretation: Sinus tachycardia Otherwise normal ECG When compared with ECG of 09-Aug-2023 22:08, No significant change was found Confirmed by Dione Booze ([id number]) on 08/20/2023 5:21:10 AM  I have reviewed the labs performed to date as well as medications administered while in observation.  Recent changes in the last 24 hours include awaiting placement.  Plan  Current plan is for meeting today by ED leadership regarding placement for the patient which could be challenging.  # Abdominal pain Start bowel regimen  # URI symptoms COVID/flu test    Rondel Baton, MD 08/30/23 [phone]

## 2023-08-31 NOTE — ED Provider Notes (Signed)
 Emergency Medicine Observation Re-evaluation Note  Megan Manning is a 43 y.o. female, seen on rounds today.  Pt initially presented to the ED for complaints of neuropathy pain Currently, the patient is awaiting placement.  Patient had episode of diarrhea which was nonbloody last night, had some pain with this but is now pain-free.  Physical Exam  BP 139/77 (BP Location: Right Arm)   Pulse (!) 114 Comment: RN notified  Temp 99.1 F (37.3 C) (Oral)   Resp 20   Ht 5' 3 (1.6 m)   Wt (!) 154.7 kg   SpO2 94%   BMI 60.41 kg/m  Physical Exam General: Resting comfortably in bed, awakens easily Lungs: Normal respiratory effort Psych: Calm, cooperative  ED Course / MDM  EKG:EKG Interpretation Date/Time:  Thursday August 19 2023 22:28:43 EST Ventricular Rate:  101 PR Interval:  120 QRS Duration:  70 QT Interval:  358 QTC Calculation: 464 R Axis:   32  Text Interpretation: Sinus tachycardia Otherwise normal ECG When compared with ECG of 09-Aug-2023 22:08, No significant change was found Confirmed by Raford Lenis ([id number]) on 08/20/2023 5:21:10 AM  I have reviewed the labs performed to date as well as medications administered while in observation.  Recent changes in the last 24 hours include no changes.  Plan  Current plan is for placement.  Patient has had some intermittent abdominal pain.  Had an episode of diarrhea last night which is resolved.  Benign exam today.  Will continue to monitor.    Megan [name], MD 08/31/23 [phone]

## 2023-08-31 NOTE — ED Notes (Signed)
IVC Current, expires Wednesday, 09/01/23; docs in [place]

## 2023-08-31 NOTE — ED Notes (Signed)
Pt refusing all medications. When questioned why, pt politely states "I don't know, I just don't want them at all".

## 2023-08-31 NOTE — ED Notes (Signed)
Pt reports feeling sick still from last night and refused her breakfast tray and morning meds. Pt wants to speak w/ the doctor. Notified EDP. Will attempt med administration at later time.

## 2023-09-01 NOTE — ED Notes (Signed)
 Rescintion has been completed and uploaded to chart efile system is down so I am unable to file at this time.

## 2023-09-01 NOTE — ED Notes (Signed)
 IVC EXPIRES TODAY WAITING FOR NURSE TO GIVE UPDATE ON IF DR. WILL BE EXTENDING OR RECINDING

## 2023-09-01 NOTE — ED Notes (Signed)
 Pt refusing vital signs

## 2023-09-01 NOTE — ED Notes (Signed)
 Patient awake and alert this morning, no s/s of distress, sitter present at the door, will continue to monitor.

## 2023-09-01 NOTE — ED Provider Notes (Signed)
 Emergency Medicine Observation Re-evaluation Note  Megan Manning is a 43 y.o. female, seen on rounds today.  Pt initially presented to the ED for complaints of neuropathy pain Currently, the patient is calm and cooperative eating breakfast.  Physical Exam  BP 110/64 (BP Location: Left Arm)   Pulse 90   Temp 98.7 F (37.1 C) (Oral)   Resp 18   Ht 5' 3 (1.6 m)   Wt (!) 154.7 kg   SpO2 95%   BMI 60.41 kg/m  Physical Exam General: Awake. Alert. No acute distress Cardiac: Regular rate rhythm Lungs: Clear to auscultation bilaterally Psych: Calm and cooperative  ED Course / MDM  EKG:EKG Interpretation Date/Time:  Thursday August 19 2023 22:28:43 EST Ventricular Rate:  101 PR Interval:  120 QRS Duration:  70 QT Interval:  358 QTC Calculation: 464 R Axis:   32  Text Interpretation: Sinus tachycardia Otherwise normal ECG When compared with ECG of 09-Aug-2023 22:08, No significant change was found Confirmed by Raford Lenis ([id number]) on 08/20/2023 5:21:10 AM  I have reviewed the labs performed to date as well as medications administered while in observation.  Recent changes in the last 24 hours include expiration of IVC.  Discussed with psychiatric team.  At this time renewal of IVC is not necessary given that patient is under guardianshipAnd is not competent.  She does need to remain on a one-to-one  Plan  Current plan is for continued boarding in the ED awaiting placement.    Pamella Ozell LABOR, DO 09/01/23 1016

## 2023-09-01 NOTE — Progress Notes (Signed)
 CSW spoke with Delilah at [facility] to discuss patient. There are no discharge planning updates available at this time.  Shepard Dicker, MSW, LCSW Transitions of Care  Clinical Social Worker II [phone]

## 2023-09-01 NOTE — ED Notes (Signed)
 Rescintion is completed has been efiled envelope [id number]

## 2023-09-02 NOTE — ED Notes (Signed)
 Pt is currently having breakfast and watching television. Sitter is right outside of the door facing pt.

## 2023-09-02 NOTE — ED Provider Notes (Signed)
 Emergency Medicine Observation Re-evaluation Note  Megan Manning is a 43 y.o. female, seen on rounds today.  Pt initially presented to the ED for complaints of neuropathy pain Currently, the patient is in room without complaint.  Physical Exam  BP 139/85 (BP Location: Right Arm)   Pulse 75   Temp 98.8 F (37.1 C) (Oral)   Resp 18   Ht 5' 3 (1.6 m)   Wt (!) 154.7 kg   SpO2 93%   BMI 60.41 kg/m  Physical Exam General: Awake, alert Cardiac: Normal rate Lungs: Normal effort Psych: Not responding to internal stimuli  ED Course / MDM  EKG:EKG Interpretation Date/Time:  Thursday August 19 2023 22:28:43 EST Ventricular Rate:  101 PR Interval:  120 QRS Duration:  70 QT Interval:  358 QTC Calculation: 464 R Axis:   32  Text Interpretation: Sinus tachycardia Otherwise normal ECG When compared with ECG of 09-Aug-2023 22:08, No significant change was found Confirmed by Raford Lenis ([id number]) on 08/20/2023 5:21:10 AM  I have reviewed the labs performed to date as well as medications administered while in observation.  Recent changes in the last 24 hours include no changes.  Patient still remains a difficult placement case..  Plan  Current plan is for placement.    Megan Pac T, DO 09/02/23 [phone]

## 2023-09-03 NOTE — Progress Notes (Signed)
 There are no discharge planning updates available at this time.  Edwin Dada, MSW, LCSW Transitions of Care  Clinical Social Worker II [phone]

## 2023-09-03 NOTE — ED Provider Notes (Signed)
 Emergency Medicine Observation Re-evaluation Note  ICYSS SKOG is a 43 y.o. female, seen on rounds today.  Pt initially presented to the ED for complaints of neuropathy pain Currently, the patient is resting.  Physical Exam  BP 102/62 (BP Location: Right Arm)   Pulse (!) 102   Temp 98.8 F (37.1 C) (Oral)   Resp 17   Ht 5' 3 (1.6 m)   Wt (!) 154.7 kg   SpO2 95%   BMI 60.41 kg/m  Physical Exam General: nad Cardiac: extremities perfused Lungs: no distress Psych: calm  ED Course / MDM  EKG:EKG Interpretation Date/Time:  Thursday August 19 2023 22:28:43 EST Ventricular Rate:  101 PR Interval:  120 QRS Duration:  70 QT Interval:  358 QTC Calculation: 464 R Axis:   32  Text Interpretation: Sinus tachycardia Otherwise normal ECG When compared with ECG of 09-Aug-2023 22:08, No significant change was found Confirmed by Raford Lenis ([id number]) on 08/20/2023 5:21:10 AM  I have reviewed the labs performed to date as well as medications administered while in observation.  Recent changes in the last 24 hours include no change, IVC was rescinded a few days ago (she has a guardian)  Plan  Current plan is for placement.    Elnor Jayson LABOR, DO 09/03/23 [phone]

## 2023-09-04 NOTE — ED Notes (Signed)
 Pt eating breakfast and watching TV quietly in room, has been calm and cooperative so far this morning.

## 2023-09-04 NOTE — ED Notes (Signed)
 ED Provider at bedside.

## 2023-09-04 NOTE — ED Notes (Signed)
 Patient report to nurse  at 1609, she voided a small amount of urine this morning. Bladder scan was performed which showed she had nothing in her bladder. Patient is drinking liquids. Dr Linder Revere was made aware.

## 2023-09-04 NOTE — ED Provider Notes (Signed)
 Emergency Medicine Observation Re-evaluation Note  Megan Manning is a 43 y.o. female, seen on rounds today.  Pt initially presented to the ED for complaints of boarding in ed for ALF placement. No new c/o this AM. Normal appetite.   Physical Exam  BP 124/71 (BP Location: Right Arm)   Pulse 91   Temp 98.3 F (36.8 C) (Oral)   Resp 19   Ht 1.6 m (5' 3)   Wt (!) 154.7 kg   SpO2 96%   BMI 60.41 kg/m  Physical Exam General: alert, calm, conversant.  Cardiac: regular rate  Lungs: breathing comfortably. Psych: calm, cooperative.   ED Course / MDM    I have reviewed the labs performed to date as well as medications administered while in observation.  Recent changes in the last 24 hours include ED obs, reassessment.   Plan  It appears patient should have returned to her former group home/ALF, and if that facility and/or guardian/LME wanted to pursue new placement, that should have occurred from current facility, with that facility giving patient/guardian minimal of 30 days notice.  Since then, pt has been boarding in ED setting.  No new c/o this AM.  TOC team working on new placement with LME/guardian.     Kariann Wecker, MD 09/04/23 0800

## 2023-09-05 LAB — URINALYSIS, ROUTINE W REFLEX MICROSCOPIC
Bilirubin Urine: NEGATIVE
Glucose, UA: NEGATIVE mg/dL
Hgb urine dipstick: NEGATIVE
Ketones, ur: NEGATIVE mg/dL
Leukocytes,Ua: NEGATIVE
Nitrite: NEGATIVE
Protein, ur: NEGATIVE mg/dL
Specific Gravity, Urine: 1.024 (ref 1.005–1.030)
pH: 5 (ref 5.0–8.0)

## 2023-09-05 NOTE — ED Notes (Signed)
 Lunch tray ordered

## 2023-09-05 NOTE — ED Provider Notes (Signed)
 Emergency Medicine Observation Re-evaluation Note  Megan Manning is a 43 y.o. female, seen on rounds today.  Pt initially presented to the ED for complaints of boarding in ed for ALF placement. Pt eating breakfast, normal appetite. Pt indicates mild dysuria/frequency. No abd or flank pain. No fever/chills. No nv.   Physical Exam  BP (!) 89/60 (BP Location: Right Arm)   Pulse 90   Temp 98.9 F (37.2 C) (Oral)   Resp 16   Ht 1.6 m (5' 3)   Wt (!) 154.7 kg   SpO2 95%   BMI 60.41 kg/m  Physical Exam General: eating breakfast.  Cardiac: regular rate.  Lungs: breathing comfortably.  Abd, soft non tender.  Psych: normal mood and affect. Calm.   ED Course / MDM    I have reviewed the labs performed to date as well as medications administered while in observation.  Recent changes in the last 24 hours include ED obs, reassessment.   Plan  Patient is boarding in ED  awaiting toc placement.   It appears patient should have returned to her former group home/ALF, and if that facility and/or guardian/LME wanted to pursue new placement, that should have occurred from current facility, with that facility giving patient/guardian minimal of 30 days notice. Since then, pt has been boarding in ED setting.   Given dysuria, will check UA, recheck vitals.   Dispo per [facility] team.    Bernard Drivers, MD 09/05/23 [phone]

## 2023-09-06 NOTE — ED Provider Notes (Signed)
 Emergency Medicine Observation Re-evaluation Note  Megan Manning is a 43 y.o. female, seen on rounds today.  Pt initially presented to the ED for complaints of neuropathy pain Currently, the patient is sleeping.  Physical Exam  BP 130/72 (BP Location: Right Arm)   Pulse 100   Temp 98.5 F (36.9 C) (Oral)   Resp (!) 22   Ht 5\' 3"  (1.6 m)   Wt (!) 154.7 kg   SpO2 92%   BMI 60.41 kg/m  Physical Exam General: No acute distress Cardiac: Well-perfused Lungs: Nonlabored Psych: Calm  ED Course / MDM  EKG:EKG Interpretation Date/Time:  Thursday August 19 2023 22:28:43 EST Ventricular Rate:  101 PR Interval:  120 QRS Duration:  70 QT Interval:  358 QTC Calculation: 464 R Axis:   32  Text Interpretation: Sinus tachycardia Otherwise normal ECG When compared with ECG of 09-Aug-2023 22:08, No significant change was found Confirmed by Alissa April ([id number]) on 08/20/2023 5:21:10 AM  I have reviewed the labs performed to date as well as medications administered while in observation.  Recent changes in the last 24 hours include social work continue to work on placement versus group home return.  Plan  Current plan is for placement.    Tonya Fredrickson, MD 09/06/23 [phone]

## 2023-09-06 NOTE — Progress Notes (Addendum)
 CSW has outreached to Delilah to inquire about placement updates. Awaiting response.   Addend @ 9:58AM Delilah reports [facility] staff will be meeting with Megan Manning this afternoon. She reports if pt is approved for [facility], it will open up other placement options.

## 2023-09-06 NOTE — ED Notes (Addendum)
 Pt resting.

## 2023-09-07 NOTE — ED Provider Notes (Signed)
Emergency Medicine Observation Re-evaluation Note  Megan Manning is a 43 y.o. female, seen on rounds today.  Pt initially presented to the ED for complaints of neuropathy pain Currently, the patient is resting.  Physical Exam  BP 122/61 (BP Location: Left Arm)   Pulse 93   Temp 98.8 F (37.1 C) (Oral)   Resp 18   Ht 5\' 3"  (1.6 m)   Wt (!) 154.7 kg   SpO2 91%   BMI 60.41 kg/m  Physical Exam General: no distress Lungs: normal effort Psych: no agitation  ED Course / MDM  EKG:EKG Interpretation Date/Time:  Thursday August 19 2023 22:28:43 EST Ventricular Rate:  101 PR Interval:  120 QRS Duration:  70 QT Interval:  358 QTC Calculation: 464 R Axis:   32  Text Interpretation: Sinus tachycardia Otherwise normal ECG When compared with ECG of 09-Aug-2023 22:08, No significant change was found Confirmed by Dione Booze ([id number]) on 08/20/2023 5:21:10 AM  I have reviewed the labs performed to date as well as medications administered while in observation.  No recent changes in the last 24 hours.  Plan  Current plan is for placement.    Pricilla Loveless, MD 09/07/23 [phone]

## 2023-09-08 MED ORDER — NYSTATIN 100000 UNIT/GM EX POWD
Freq: Once | CUTANEOUS | Status: AC
  Filled 2023-09-08: qty 15

## 2023-09-08 NOTE — Progress Notes (Addendum)
CSW spoke with Delilah at [facility] who states there are no new discharge planning updates available at this time. Delilah states patient has been faxed to 8 additional facilities for review in attempt to obtain a bed offer.  Edwin Dada, MSW, LCSW Transitions of Care  Clinical Social Worker II [phone]

## 2023-09-08 NOTE — ED Provider Notes (Signed)
Emergency Medicine Observation Re-evaluation Note  Megan Manning is a 43 y.o. female, seen on rounds today.  Pt initially presented to the ED for complaints of neuropathy pain Currently, the patient is sleeping but easily awakened.  Complains that she has had some liquid stool over the last few days and it burns when she urinates.  Patient had a urine done within the last week that has been negative.  Nurses have not noticed significant diarrhea patient does have multiple somatic symptoms.  Physical Exam  BP 128/67 (BP Location: Right Arm)   Pulse 95   Temp 99.2 F (37.3 C) (Oral)   Resp 18   Ht 5\' 3"  (1.6 m)   Wt (!) 154.7 kg   SpO2 95%   BMI 60.41 kg/m  Physical Exam General: No acute distress Cardiac: Regular rate and rhythm Lungs: Clear to auscultation Psych: Cooperative Skin exam with concern for yeast in the genital area  ED Course / MDM  EKG:EKG Interpretation Date/Time:  Thursday August 19 2023 22:28:43 EST Ventricular Rate:  101 PR Interval:  120 QRS Duration:  70 QT Interval:  358 QTC Calculation: 464 R Axis:   32  Text Interpretation: Sinus tachycardia Otherwise normal ECG When compared with ECG of 09-Aug-2023 22:08, No significant change was found Confirmed by Dione Booze ([id number]) on 08/20/2023 5:21:10 AM  I have reviewed the labs performed to date as well as medications administered while in observation.  Recent changes in the last 24 hours include no acute issues.  Plan  Current plan is for still waiting for placement but will start nystatin powder.    Gwyneth Sprout, MD 09/08/23 [phone]
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
## 2023-09-10 LAB — RESP PANEL BY RT-PCR (RSV, FLU A&B, COVID)  RVPGX2
Influenza A by PCR: NEGATIVE
Influenza B by PCR: NEGATIVE
Resp Syncytial Virus by PCR: NEGATIVE
SARS Coronavirus 2 by RT PCR: NEGATIVE

## 2023-09-10 LAB — URINALYSIS, W/ REFLEX TO CULTURE (INFECTION SUSPECTED)
Bilirubin Urine: NEGATIVE
Glucose, UA: NEGATIVE mg/dL
Hgb urine dipstick: NEGATIVE
Ketones, ur: NEGATIVE mg/dL
Leukocytes,Ua: NEGATIVE
Nitrite: NEGATIVE
Protein, ur: NEGATIVE mg/dL
Specific Gravity, Urine: 1.024 (ref 1.005–1.030)
pH: 6 (ref 5.0–8.0)

## 2023-09-10 LAB — PREGNANCY, URINE: Preg Test, Ur: NEGATIVE

## 2023-09-10 MED ORDER — SENNA 8.6 MG PO TABS
1.0000 | ORAL_TABLET | Freq: Every day | ORAL | Status: DC
  Administered 2023-09-10 – 2023-10-13 (×18): 8.6 mg via ORAL
  Filled 2023-09-10 (×21): qty 1

## 2023-09-10 NOTE — ED Notes (Signed)
Pt educated that RN will need urine sample

## 2023-09-10 NOTE — ED Provider Notes (Signed)
Emergency Medicine Observation Re-evaluation Note  Megan Manning is a 43 y.o. female, seen on rounds today.  Pt initially presented to the ED for complaints of neuropathy pain Currently, the patient is awaiting placement.  She does report cough beginning yesterday, congestion. Has some diffuse abdominal discomfort. Has not been able to have a BM in a few days. Urine smells strongly.   Physical Exam  BP (!) 151/98 (BP Location: Right Arm)   Pulse 96   Temp 98.4 F (36.9 C) (Oral)   Resp 18   Ht 5\' 3"  (1.6 m)   Wt (!) 154.7 kg   SpO2 96%   BMI 60.41 kg/m  Physical Exam General: NAD Cardiac: RR Lungs: even unlabored Abd: mild diffuse tenderness, no rebound, no guarding, no focal tenderness, no distention Psych: calm  ED Course / MDM  EKG:EKG Interpretation Date/Time:  Thursday August 19 2023 22:28:43 EST Ventricular Rate:  101 PR Interval:  120 QRS Duration:  70 QT Interval:  358 QTC Calculation: 464 R Axis:   32  Text Interpretation: Sinus tachycardia Otherwise normal ECG When compared with ECG of 09-Aug-2023 22:08, No significant change was found Confirmed by Dione Booze ([id number]) on 08/20/2023 5:21:10 AM  I have reviewed the labs performed to date as well as medications administered while in observation.  Recent changes in the last 24 hours include none.  Plan  Current plan is for placement.  Today, has a cough on exam and history beginning yesterday. Doubt pneumonia with new symptoms, no hypoxia. Suspect likely viral etiology.  Ordered COVID/flu/RSV testing. Ordered urinalysis due to her concerns regarding urinary tract symptoms.  Abdominal exam benign and doubt acute surgical emergency, SBO. Suspect constipation as well as viral illness at this time.  Added senna to bowel regimen which also includes miralax, docusate.        [name], MD 09/10/23 1001

## 2023-09-11 NOTE — ED Provider Notes (Signed)
 Emergency Medicine Observation Re-evaluation Note  Megan Manning is a 43 y.o. female, seen on rounds today.  Pt initially presented to the ED for complaints of neuropathy pain Currently, the patient is resting quietly.  Physical Exam  BP 117/65 (BP Location: Other (Comment)) Comment (BP Location): R FA  Pulse (!) 102   Temp 98.2 F (36.8 C) (Oral)   Resp 20   Ht 5\' 3"  (1.6 m)   Wt (!) 154.7 kg   SpO2 91%   BMI 60.41 kg/m  Physical Exam General: No acute distress Cardiac: Refused Lungs: No labored Psych: Calm  ED Course / MDM  EKG:EKG Interpretation Date/Time:  Thursday August 19 2023 22:28:43 EST Ventricular Rate:  101 PR Interval:  120 QRS Duration:  70 QT Interval:  358 QTC Calculation: 464 R Axis:   32  Text Interpretation: Sinus tachycardia Otherwise normal ECG When compared with ECG of 09-Aug-2023 22:08, No significant change was found Confirmed by Dione Booze ([id number]) on 08/20/2023 5:21:10 AM  I have reviewed the labs performed to date as well as medications administered while in observation.  Recent changes in the last 24 hours include patient had urinalysis checked and COVID flu swabs.  All that was negative.  Plan  Current plan is for placement.    Terrilee Files, MD 09/11/23 1058

## 2023-09-11 NOTE — ED Notes (Signed)
 Per staffing, no sitters available

## 2023-09-12 NOTE — ED Provider Notes (Signed)
 Emergency Medicine Observation Re-evaluation Note  Megan Manning is a 43 y.o. female, seen on rounds today.  Pt initially presented to the ED for complaints of neuropathy pain Currently, the patient is sleeping.  Physical Exam  BP (!) 90/44 (BP Location: Right Arm) Comment: nurse aware of BP  Pulse (!) 102   Temp 98.3 F (36.8 C) (Oral)   Resp 18   Ht 5\' 3"  (1.6 m)   Wt (!) 154.7 kg   SpO2 90%   BMI 60.41 kg/m  Physical Exam General: No acute distress Cardiac: Well-perfused Lungs: Nonlabored Psych: Calm  ED Course / MDM  EKG:EKG Interpretation Date/Time:  Thursday August 19 2023 22:28:43 EST Ventricular Rate:  101 PR Interval:  120 QRS Duration:  70 QT Interval:  358 QTC Calculation: 464 R Axis:   32  Text Interpretation: Sinus tachycardia Otherwise normal ECG When compared with ECG of 09-Aug-2023 22:08, No significant change was found Confirmed by Dione Booze ([id number]) on 08/20/2023 5:21:10 AM  I have reviewed the labs performed to date as well as medications administered while in observation.  Recent changes in the last 24 hours include no significant changes.  Plan  Current plan is for placement.    Terrilee Files, MD 09/12/23 1053

## 2023-09-12 NOTE — ED Notes (Signed)
 Offered pt tylenol for pain. Pt refused.

## 2023-09-12 NOTE — ED Notes (Signed)
 Pt c/o sharp needle-like central chest pain. HR 116. Pt in NAD otherwise. Notified EDP.

## 2023-09-12 NOTE — Progress Notes (Signed)
 There are no discharge planning updates available.  CSW will follow up with [facility] staff on Monday to determine if any progress has been made with obtaining a bed.  Edwin Dada, MSW, LCSW Transitions of Care  Clinical Social Worker II [phone]

## 2023-09-12 NOTE — ED Notes (Signed)
 Assumed pt care.

## 2023-09-13 NOTE — ED Notes (Addendum)
 Contacted [place] MD about pt chest pain. Pt states "chest feels sharp and feels liik epins"  MD response, "Ok"

## 2023-09-13 NOTE — ED Notes (Signed)
 This RN scanned pt medication. Upon administration pt refused medication, but medication was already opened. Medication wasted in the pyxis and medication discarded by this RN and witnessed by triage, RN

## 2023-09-13 NOTE — ED Provider Notes (Signed)
 Emergency Medicine Observation Re-evaluation Note  Megan Manning is a 43 y.o. female, seen on rounds today.  Pt initially presented to the ED for complaints of neuropathy pain Currently, the patient is resting comfortably.  Physical Exam  BP 122/72 (BP Location: Right Wrist)   Pulse (!) 116   Temp 98.3 F (36.8 C) (Oral)   Resp 18   Ht 5\' 3"  (1.6 m)   Wt (!) 154.7 kg   SpO2 90%   BMI 60.41 kg/m  Physical Exam General: Resting comfortably in stretcher watching television Lungs: Normal work of breathing Psych: Calm   ED Course / MDM  EKG:EKG Interpretation Date/Time:  Sunday September 12 2023 18:21:15 EST Ventricular Rate:  107 PR Interval:  116 QRS Duration:  72 QT Interval:  334 QTC Calculation: 445 R Axis:   37  Text Interpretation: Sinus tachycardia Cannot rule out Anterior infarct , age undetermined Abnormal ECG When compared with ECG of 19-Aug-2023 22:28, No significant change since last tracing Confirmed by Meridee Score [phone]) on 09/12/2023 6:41:09 PM  I have reviewed the labs performed to date as well as medications administered while in observation.  Recent changes in the last 24 hours include no acute events.  Plan  Current plan is for placement.    Rondel Baton, MD 09/13/23 [phone]

## 2023-09-14 NOTE — ED Notes (Signed)
 PT is in the shower and c/o not being able to have a bowl movement. PT has declined her miralax due to it makes her poop. This RN explained to the PT that this is the goal.

## 2023-09-14 NOTE — ED Provider Notes (Signed)
 Emergency Medicine Observation Re-evaluation Note  Megan Manning is a 43 y.o. female, seen on rounds today.  Pt initially presented to the ED for complaints of neuropathy pain Currently, the patient is resting comfortably.  Physical Exam  BP 133/79   Pulse 98   Temp 98.6 F (37 C) (Oral)   Resp 16   Ht 5\' 3"  (1.6 m)   Wt (!) 154.7 kg   SpO2 98%   BMI 60.41 kg/m  Physical Exam General: NAD   ED Course / MDM  EKG:EKG Interpretation Date/Time:  Sunday September 12 2023 18:21:15 EST Ventricular Rate:  107 PR Interval:  116 QRS Duration:  72 QT Interval:  334 QTC Calculation: 445 R Axis:   37  Text Interpretation: Sinus tachycardia Cannot rule out Anterior infarct , age undetermined Abnormal ECG When compared with ECG of 19-Aug-2023 22:28, No significant change since last tracing Confirmed by Meridee Score [phone]) on 09/12/2023 6:41:09 PM  I have reviewed the labs performed to date as well as medications administered while in observation.  Recent changes in the last 24 hours include no acute events reported.  Plan  Current plan is for placement.    Wynetta Fines, MD 09/14/23 [phone]

## 2023-09-14 NOTE — ED Notes (Signed)
PT is sleeping

## 2023-09-14 NOTE — ED Notes (Signed)
PT ambulated to the bathroom.

## 2023-09-14 NOTE — ED Notes (Signed)
PT is resting in bed .

## 2023-09-14 NOTE — ED Notes (Signed)
 PT is watching television

## 2023-09-14 NOTE — Progress Notes (Signed)
 CSW spoke with Rosey Bath at [facility] who states patient is still under review by several [facility] providers but no bed offers have been obtained.  Edwin Dada, MSW, LCSW Transitions of Care  Clinical Social Worker II [phone]

## 2023-09-14 NOTE — ED Notes (Signed)
PT is resting comfortably.

## 2023-09-15 NOTE — ED Provider Notes (Signed)
 Emergency Medicine Observation Re-evaluation Note  Megan Manning is a 43 y.o. female, seen on rounds today.  Pt initially presented to the ED for complaints of neuropathy pain Currently, the patient is resting in their bed.  Physical Exam  BP 120/63 (BP Location: Right Wrist)   Pulse (!) 109   Temp 98.8 F (37.1 C) (Oral)   Resp 19   Ht 5\' 3"  (1.6 m)   Wt (!) 154.7 kg   SpO2 96%   BMI 60.41 kg/m  Physical Exam General: resting comfortably, NAD Lungs: normal WOB Psych: currently calm and resting  ED Course / MDM  EKG:EKG Interpretation Date/Time:  Sunday September 12 2023 18:21:15 EST Ventricular Rate:  107 PR Interval:  116 QRS Duration:  72 QT Interval:  334 QTC Calculation: 445 R Axis:   37  Text Interpretation: Sinus tachycardia Cannot rule out Anterior infarct , age undetermined Abnormal ECG When compared with ECG of 19-Aug-2023 22:28, No significant change since last tracing Confirmed by Meridee Score [phone]) on 09/12/2023 6:41:09 PM  I have reviewed the labs performed to date as well as medications administered while in observation.  Recent changes in the last 24 hours include none.  Plan  Current plan is for placement, TOC following.    Rozelle Logan, DO 09/15/23 1122

## 2023-09-15 NOTE — Progress Notes (Signed)
 CSW spoke with Delilah at [facility] who states there are no discharge planning updates available at this time. Patient remains under review by several residential facilities without any confirmed bed offers.  Edwin Dada, MSW, LCSW Transitions of Care  Clinical Social Worker II [phone]

## 2023-09-16 NOTE — ED Provider Notes (Signed)
 Emergency Medicine Observation Re-evaluation Note  Megan Manning is a 43 y.o. female, seen on rounds today.  Pt initially presented to the ED for complaints of neuropathy pain Currently, the patient is awake, no new concerns by nursing staff.  Physical Exam  BP 133/87 (BP Location: Right Arm)   Pulse (!) 116   Temp 98.7 F (37.1 C) (Oral)   Resp 17   Ht 5\' 3"  (1.6 m)   Wt (!) 154.7 kg   SpO2 94%   BMI 60.41 kg/m  Physical Exam General: Awake, no acute distress Cardiac: mildly tachycardic Lungs: no increased WOB Psych: Calm, cooperative   ED Course / MDM  EKG:EKG Interpretation Date/Time:  Sunday September 12 2023 18:21:15 EST Ventricular Rate:  107 PR Interval:  116 QRS Duration:  72 QT Interval:  334 QTC Calculation: 445 R Axis:   37  Text Interpretation: Sinus tachycardia Cannot rule out Anterior infarct , age undetermined Abnormal ECG When compared with ECG of 19-Aug-2023 22:28, No significant change since last tracing Confirmed by Meridee Score [phone]) on 09/12/2023 6:41:09 PM  I have reviewed the labs performed to date as well as medications administered while in observation.  Recent changes in the last 24 hours include patient remains medically cleared. She is being evaluated by SW for placement.  Plan  Current plan is for placement.    Rexford Maus, DO 09/16/23 [phone]

## 2023-09-16 NOTE — ED Notes (Signed)
 Pt c/o wound to R leg. Appears that pt has been scratching and opened up some bumps on her legs. Pt in NAD. Pt c/o R leg pain, offered tylenol, ibuprofen and repositioning which pt refused all three.

## 2023-09-17 NOTE — ED Notes (Signed)
 Pt reporting nausea and then asking for a snack. Will continue to monitor pt behaviors

## 2023-09-17 NOTE — ED Provider Notes (Signed)
 Emergency Medicine Observation Re-evaluation Note  Megan Manning is a 43 y.o. female, seen on rounds today.  Pt initially presented to the ED for complaints of neuropathy pain Currently, the patient is resting in bed without acute distress.  Physical Exam  BP 133/77 (BP Location: Left Arm)   Pulse (!) 113   Temp 98.4 F (36.9 C) (Oral)   Resp 16   Ht 5\' 3"  (1.6 m)   Wt (!) 154.7 kg   SpO2 94%   BMI 60.41 kg/m  Physical Exam General: Resting without agitation Cardiac: No murmur on my exam Lungs: Clear breath sounds bilaterally Psych: No acute agitation  ED Course / MDM  EKG:EKG Interpretation Date/Time:  Sunday September 12 2023 18:21:15 EST Ventricular Rate:  107 PR Interval:  116 QRS Duration:  72 QT Interval:  334 QTC Calculation: 445 R Axis:   37  Text Interpretation: Sinus tachycardia Cannot rule out Anterior infarct , age undetermined Abnormal ECG When compared with ECG of 19-Aug-2023 22:28, No significant change since last tracing Confirmed by Meridee Score [phone]) on 09/12/2023 6:41:09 PM  I have reviewed the labs performed to date as well as medications administered while in observation.  Recent changes in the last 24 hours include none reported by overnight nursing.  Plan  Current plan is for awaiting placement.    Solymar Grace, Canary Brim, MD 09/17/23 1057

## 2023-09-17 NOTE — ED Notes (Signed)
 Pt concerned about hemorrhoids. None noted on visual exam. Notified EDP about pt concern.

## 2023-09-17 NOTE — ED Notes (Signed)
Pt having 1st phone call  ?

## 2023-09-18 LAB — RESP PANEL BY RT-PCR (RSV, FLU A&B, COVID)  RVPGX2
Influenza A by PCR: NEGATIVE
Influenza B by PCR: NEGATIVE
Resp Syncytial Virus by PCR: NEGATIVE
SARS Coronavirus 2 by RT PCR: NEGATIVE

## 2023-09-18 MED ORDER — FAMOTIDINE 20 MG PO TABS
20.0000 mg | ORAL_TABLET | Freq: Once | ORAL | Status: AC
  Administered 2023-09-18: 20 mg via ORAL
  Filled 2023-09-18: qty 1

## 2023-09-18 MED ORDER — BENZONATATE 100 MG PO CAPS
100.0000 mg | ORAL_CAPSULE | Freq: Once | ORAL | Status: AC
  Administered 2023-09-18: 100 mg via ORAL
  Filled 2023-09-18: qty 1

## 2023-09-18 NOTE — ED Notes (Signed)
 Pt c/o of somatic things going on; No new concerns; Pt requested to see EDP; EDP notified and will assess patient; Pt does not appear to be in NAD at this time but continues to c/o abdominal pain and chest discomfort for over 24 hours; Pt frequently makes the same complaints after testing is done-[name],RN

## 2023-09-18 NOTE — Progress Notes (Signed)
 There are no discharge planning updates available at this time.  Edwin Dada, MSW, LCSW Transitions of Care  Clinical Social Worker II [phone]

## 2023-09-18 NOTE — ED Provider Notes (Signed)
 I was called to the bedside by ED RN Milagros Loll due to patient reporting chest tightness.  Patient with history of asthma per ED RN, daily has episodes of reported worsening of her chest tightness, however does appear behavioral.  Patient already received albuterol inhaler by time of my arrival to the bedside.  When standing in the doorway talking to the ED RN patient is resting calmly without evidence of wheezing, however when this provider began to engage the patient she began to exaggerate upper airway noises.  On pulmonary auscultation the lungs are clear to auscultation bilaterally, vital signs are stable with patient's baseline tachycardia heart rate of 105.  Clinical concern for emergent underlying condition that would warrant further workup or inpatient medical management is eating well; patient's presentation appears more consistent with attention-seeking behaviors.  This chart was dictated using voice recognition software, Dragon. Despite the best efforts of this provider to proofread and correct errors, errors may still occur which can change documentation meaning.    Paris Lore, PA-C 09/18/23 0534    Gilda Crease, MD 09/18/23 0730

## 2023-09-18 NOTE — ED Notes (Signed)
 ED Provider at bedside.

## 2023-09-18 NOTE — ED Provider Notes (Signed)
 Emergency Medicine Observation Re-evaluation Note  Megan Manning is a 43 y.o. female, seen on rounds today.  Pt initially presented to the ED for complaints of neuropathy pain Currently, the patient is awaiting placement.  Physical Exam  BP (!) 152/97 (BP Location: Right Arm)   Pulse (!) 114   Temp 99 F (37.2 C) (Oral)   Resp 18   Ht 5\' 3"  (1.6 m)   Wt (!) 154.7 kg   SpO2 93%   BMI 60.41 kg/m  Physical Exam General: Sitting in bed watching TV Lungs: Normal respiratory effort Psych: Calm, cooperative  ED Course / MDM  EKG:EKG Interpretation Date/Time:  Sunday September 12 2023 18:21:15 EST Ventricular Rate:  107 PR Interval:  116 QRS Duration:  72 QT Interval:  334 QTC Calculation: 445 R Axis:   37  Text Interpretation: Sinus tachycardia Cannot rule out Anterior infarct , age undetermined Abnormal ECG When compared with ECG of 19-Aug-2023 22:28, No significant change since last tracing Confirmed by Meridee Score [phone]) on 09/12/2023 6:41:09 PM  I have reviewed the labs performed to date as well as medications administered while in observation.  Recent changes in the last 24 hours include no changes.  Plan  Current plan is for placement.    Laurence Spates, MD 09/18/23 [phone]

## 2023-09-19 NOTE — ED Provider Notes (Signed)
 Emergency Medicine Observation Re-evaluation Note  Megan Manning is a 43 y.o. female, seen on rounds today.  Pt initially presented to the ED for complaints of neuropathy pain Currently, the patient is resting comfortably in bed.  Vital signs overnight notable for tachycardia.  She has had intermittent tachycardia and supposed be taken metoprolol.  She appears to not taking it over the last few days.  She states sometimes it makes her dizzy.  I talked with her about the importance of making sure her heart rate is controlled, she was amenable to taking it today and seeing how she feels.  She is otherwise hemodynamically stable.  No other symptoms or concerns.  Physical Exam  BP (!) 144/77 (BP Location: Right Wrist)   Pulse (!) 120   Temp 98.7 F (37.1 C) (Oral)   Resp 17   Ht 5\' 3"  (1.6 m)   Wt (!) 154.7 kg   SpO2 93%   BMI 60.41 kg/m  Physical Exam General: Resting comfortably Cardiac: Clear heart sounds Lungs: Normal respiratory effort Psych: Calm, cooperative  ED Course / MDM  EKG:EKG Interpretation Date/Time:  Sunday September 12 2023 18:21:15 EST Ventricular Rate:  107 PR Interval:  116 QRS Duration:  72 QT Interval:  334 QTC Calculation: 445 R Axis:   37  Text Interpretation: Sinus tachycardia Cannot rule out Anterior infarct , age undetermined Abnormal ECG When compared with ECG of 19-Aug-2023 22:28, No significant change since last tracing Confirmed by Meridee Score [phone]) on 09/12/2023 6:41:09 PM  I have reviewed the labs performed to date as well as medications administered while in observation.  Recent changes in the last 24 hours include no changes.  Plan  Current plan is for placement.    Laurence Spates, MD 09/19/23 [phone]

## 2023-09-19 NOTE — ED Notes (Signed)
 Pt showered, changed gown and received clean bed linen.

## 2023-09-19 NOTE — ED Notes (Signed)
 PT refusing cardiac medications, only taking 25mg  metoprolol, states it makes her 'dizzy', EDP made aware

## 2023-09-20 MED ORDER — ALUM & MAG HYDROXIDE-SIMETH 200-200-20 MG/5ML PO SUSP
30.0000 mL | Freq: Once | ORAL | Status: AC
  Administered 2023-09-20: 30 mL via ORAL
  Filled 2023-09-20: qty 30

## 2023-09-20 MED ORDER — LIDOCAINE VISCOUS HCL 2 % MT SOLN
15.0000 mL | Freq: Once | OROMUCOSAL | Status: AC
  Administered 2023-09-20: 15 mL via ORAL
  Filled 2023-09-20: qty 15

## 2023-09-20 NOTE — Progress Notes (Signed)
 CSW spoke with Toni Amend and Delilah at [facility] via email to obtain updates. Toni Amend states she has spoken with who states she has spoken with [facility] to determine their availability as they were suggested by the psychologist that monitors Anaiza's behavior support plan. Toni Amend to provide CSW with updates as they become available.  Edwin Dada, MSW, LCSW Transitions of Care  Clinical Social Worker II [phone]

## 2023-09-20 NOTE — ED Notes (Addendum)
 Patient refuse Metoprolol Succinate, colace, Miralax, and Synthroid this morning.

## 2023-09-20 NOTE — ED Provider Notes (Signed)
 Emergency Medicine Observation Re-evaluation Note  Megan Manning is a 43 y.o. female, seen on rounds today.  Pt initially presented to the ED for complaints of neuropathy pain Currently, the patient is sleeping.  Physical Exam  BP 114/73 (BP Location: Right Wrist)   Pulse (!) 108   Temp 98.5 F (36.9 C) (Oral)   Resp 20   Ht 1.6 m (5\' 3" )   Wt (!) 154.7 kg   SpO2 94%   BMI 60.41 kg/m  Physical Exam   ED Course / MDM  EKG:EKG Interpretation Date/Time:  Sunday September 12 2023 18:21:15 EST Ventricular Rate:  107 PR Interval:  116 QRS Duration:  72 QT Interval:  334 QTC Calculation: 445 R Axis:   37  Text Interpretation: Sinus tachycardia Cannot rule out Anterior infarct , age undetermined Abnormal ECG When compared with ECG of 19-Aug-2023 22:28, No significant change since last tracing Confirmed by Meridee Score [phone]) on 09/12/2023 6:41:09 PM  I have reviewed the labs performed to date as well as medications administered while in observation.  Recent changes in the last 24 hours include patient had an increase in heart rate which was managed by prior provider.  Plan  Current plan is for placement.    Lorre Nick, MD 09/20/23 [phone]

## 2023-09-21 NOTE — ED Notes (Signed)
 Just assumed care of patient. Patient sitting in bed with sitter at bedside for 1:1. Patient is in no noted distress at the present time will continue to monitor for any changes. Patient is A&O times 4.

## 2023-09-21 NOTE — Progress Notes (Signed)
 CSW received call from [facility] @  [facility] [employer] [facility] [phone]) to discuss patient. Misty Stanley states patient has been hospitalized for over 240 days in the last calendar year. Misty Stanley states she continues to work with [employer] to locate a new placement for patient however no bed offers have been obtained.  Edwin Dada, MSW, LCSW Transitions of Care  Clinical Social Worker II [phone]

## 2023-09-21 NOTE — ED Notes (Signed)
 Pt appears to be sleeping. Equal bilateral rise & fall of chest observed with no visible signs of distress. Will defer PO medication until patient wakes.

## 2023-09-21 NOTE — ED Provider Notes (Signed)
 Emergency Medicine Observation Re-evaluation Note  Megan Manning is a 43 y.o. female, seen on rounds today.  Pt initially presented to the ED for complaints of neuropathy pain Currently, the patient is ambulating, eating,interacting with staff.  Physical Exam  BP 112/66 (BP Location: Right Wrist)   Pulse (!) 113   Temp 98.7 F (37.1 C) (Oral)   Resp 18   Ht 5\' 3"  (1.6 m)   Wt (!) 154.7 kg   SpO2 92%   BMI 60.41 kg/m  Physical Exam General: well, no distress Cardiac: regular Lungs: CTA Psych: calm  ED Course / MDM  EKG:EKG Interpretation Date/Time:  Sunday September 12 2023 18:21:15 EST Ventricular Rate:  107 PR Interval:  116 QRS Duration:  72 QT Interval:  334 QTC Calculation: 445 R Axis:   37  Text Interpretation: Sinus tachycardia Cannot rule out Anterior infarct , age undetermined Abnormal ECG When compared with ECG of 19-Aug-2023 22:28, No significant change since last tracing Confirmed by Meridee Score [phone]) on 09/12/2023 6:41:09 PM  I have reviewed the labs performed to date as well as medications administered while in observation.  Recent changes in the last 24 hours include no change.  Plan  Current plan is for placement.    Arby Barrette, MD 09/21/23 1245

## 2023-09-21 NOTE — ED Notes (Signed)
 Pt reported pain in her left ribs rated 2/10. Pt offered PRN tylenol; pt refused.

## 2023-09-22 NOTE — ED Notes (Signed)
Patient refused all night medications.

## 2023-09-22 NOTE — ED Notes (Signed)
 Patient stated her ribs hurt, RN offered PRN tylenol or advil and patient declined.

## 2023-09-22 NOTE — ED Provider Notes (Signed)
 Emergency Medicine Observation Re-evaluation Note  Megan Manning is a 43 y.o. female, seen on rounds today.  Pt initially presented to the ED for complaints of neuropathy pain Currently, the patient is in room asleep.  Physical Exam  BP 115/60   Pulse 89   Temp 98.6 F (37 C)   Resp 18   Ht 5\' 3"  (1.6 m)   Wt (!) 154.7 kg   SpO2 96%   BMI 60.41 kg/m  Physical Exam Vitals reviewed.  Constitutional:      Appearance: Normal appearance.  Cardiovascular:     Rate and Rhythm: Normal rate.  Pulmonary:     Effort: Pulmonary effort is normal.  Abdominal:     General: There is no distension.  Neurological:     General: No focal deficit present.     Mental Status: She is alert.  Psychiatric:        Mood and Affect: Mood normal.      ED Course / MDM  EKG:EKG Interpretation Date/Time:  Monday September 20 2023 19:40:12 EST Ventricular Rate:  107 PR Interval:  120 QRS Duration:  74 QT Interval:  344 QTC Calculation: 459 R Axis:   50  Text Interpretation: Sinus tachycardia Otherwise normal ECG When compared with ECG of 12-Sep-2023 18:21, PREVIOUS ECG IS PRESENT Confirmed by Estanislado Pandy [phone]) on 09/21/2023 9:14:14 PM  I have reviewed the labs performed to date as well as medications administered while in observation.  Recent changes in the last 24 hours include no changes.  Plan  Current plan is for placement which has proven difficult.    Anders Simmonds T, DO 09/22/23 [phone]

## 2023-09-22 NOTE — ED Notes (Signed)
 Patient has complaints of not being able to have a bowel movement, RN gave PRN medication.

## 2023-09-23 NOTE — Progress Notes (Signed)
 CSW received email from Delilah at [facility] regarding patient. Delilah states that per Doll[facility] of [facility] has reached out to all outstanding referrals for AFL/group homes and ICF's and received a request to review patient for a possible ICF bed in [place], [place]. Courtney and Delilah will continue to attempt to locate placement for patient.  Edwin Dada, MSW, LCSW Transitions of Care  Clinical Social Worker II [phone]

## 2023-09-23 NOTE — ED Provider Notes (Signed)
 Emergency Medicine Observation Re-evaluation Note  Megan Manning is a 43 y.o. female, seen on rounds today.  Pt initially presented to the ED for complaints of neuropathy pain Currently, the patient is resting. No problems with breakfast.  Physical Exam  BP 109/86 (BP Location: Right Wrist)   Pulse 90   Temp 98.8 F (37.1 C) (Oral)   Resp 18   Ht 5\' 3"  (1.6 m)   Wt (!) 154.7 kg   SpO2 92%   BMI 60.41 kg/m  Physical Exam General: resting, no distress Abd: No tenderness Lungs: normal effort Psych: no agitation  ED Course / MDM  EKG:EKG Interpretation Date/Time:  Monday September 20 2023 19:40:12 EST Ventricular Rate:  107 PR Interval:  120 QRS Duration:  74 QT Interval:  344 QTC Calculation: 459 R Axis:   50  Text Interpretation: Sinus tachycardia Otherwise normal ECG When compared with ECG of 12-Sep-2023 18:21, PREVIOUS ECG IS PRESENT Confirmed by Estanislado Pandy [phone]) on 09/21/2023 9:14:14 PM  I have reviewed the labs performed to date as well as medications administered while in observation.  No recent changes in the last 24 hours.  Plan  Current plan is for placement.    Pricilla Loveless, MD 09/23/23 [phone]

## 2023-09-24 NOTE — ED Provider Notes (Signed)
 Emergency Medicine Observation Re-evaluation Note  Megan Manning is a 43 y.o. female, seen on rounds today.  Pt initially presented to the ED for complaints of neuropathy pain Currently, the patient is awake and alert.  She has basically been here since 12/19 after swallowing a pill bottle out of anger at her alf.  She was d/c on 12/20, but came back later that day after swallowing more items.  She has been here since then awaiting placement.  She said her bp has dropped and it makes her feel bad.  She has been intermittently refusing meds.  Physical Exam  BP 137/73 (BP Location: Right Arm)   Pulse (!) 103   Temp 98.3 F (36.8 C) (Oral)   Resp 18   Ht 5\' 3"  (1.6 m)   Wt (!) 154.7 kg   SpO2 93%   BMI 60.41 kg/m  Physical Exam General: awake and alert Cardiac: rr Lungs: clear Psych: calm  ED Course / MDM  EKG:EKG Interpretation Date/Time:  Monday September 20 2023 19:40:12 EST Ventricular Rate:  107 PR Interval:  120 QRS Duration:  74 QT Interval:  344 QTC Calculation: 459 R Axis:   50  Text Interpretation: Sinus tachycardia Otherwise normal ECG When compared with ECG of 12-Sep-2023 18:21, PREVIOUS ECG IS PRESENT Confirmed by Estanislado Pandy [phone]) on 09/21/2023 9:14:14 PM  I have reviewed the labs performed to date as well as medications administered while in observation.  Recent changes in the last 24 hours include none.  Plan  Current plan is for placement.    Jacalyn Lefevre, MD 09/24/23 [phone]

## 2023-09-25 NOTE — ED Notes (Signed)
 Patient refuse to get a updated vitals

## 2023-09-25 NOTE — ED Notes (Signed)
 Assumed pt care from [facility]

## 2023-09-25 NOTE — ED Provider Notes (Signed)
 Emergency Medicine Observation Re-evaluation Note  Megan Manning is a 43 y.o. female, seen on rounds today.  Pt initially presented to the ED with hx oppositional defiant behavior, swallowing objects. Pt has been boarding in ED awaiting TOC placement to new facility. No new c/o this AM, indicates has eaten breakfast.   Physical Exam  BP 118/74 (BP Location: Right Arm)   Pulse (!) 115   Temp 99.1 F (37.3 C) (Oral)   Resp 18   Ht 1.6 m (5\' 3" )   Wt (!) 154.7 kg   SpO2 90%   BMI 60.41 kg/m  Physical Exam General: resting, easily aroused.  Cardiac: regular rate (currently hr 94) Lungs: breathing comfortably.  Psych: calm, cooperative.   ED Course / MDM    I have reviewed the labs performed to date as well as medications administered while in observation.  Recent changes in the last 24 hours include ED obs, reassessment.   Plan  Current plan is for [facility] placement to new facility. As patient already in ED for considerable period, it appears [facility] leaders may need to f/u with [facility] team/leaders Monday to explore for expeditious finding of new facility.     Cathren Laine, MD 09/25/23 [phone]

## 2023-09-25 NOTE — ED Notes (Signed)
 Patient refused Miralax, Senokot, Protonix, Colace and Metoprolol Succinate.

## 2023-09-25 NOTE — ED Notes (Signed)
 Dr Cecille Aver made aware of patient complaining of chest pain, pt was offered tylenol but pt refused medications. We attempted to get vital signs and EKG on pt but she refused.

## 2023-09-26 LAB — RESP PANEL BY RT-PCR (RSV, FLU A&B, COVID)  RVPGX2
Influenza A by PCR: NEGATIVE
Influenza B by PCR: NEGATIVE
Resp Syncytial Virus by PCR: NEGATIVE
SARS Coronavirus 2 by RT PCR: NEGATIVE

## 2023-09-26 MED ORDER — METOPROLOL SUCCINATE ER 25 MG PO TB24
25.0000 mg | ORAL_TABLET | Freq: Every day | ORAL | Status: DC
  Administered 2023-09-26 – 2023-10-13 (×17): 25 mg via ORAL
  Filled 2023-09-26 (×18): qty 1

## 2023-09-26 MED ORDER — METOPROLOL SUCCINATE ER 25 MG PO TB24: 25.0000 mg | ORAL_TABLET | Freq: Every day | ORAL | Status: DC

## 2023-09-26 NOTE — ED Provider Notes (Signed)
 Emergency Medicine Observation Re-evaluation Note  Megan Manning is a 43 y.o. female, seen on rounds today.  Pt initially presented to the ED for complaints of neuropathy pain Currently, the patient is sitting in the bed in no acute distress.  Patient has remained continually tachycardic but is often times refusing medication.  When talking with her about this she states that she refuses the medicine because it makes her feel lightheaded and dizzy.  Discussed with her we would try to decrease the dose to see if she tolerated the medication better and to achieve more consistent heart rate control.  Physical Exam  BP 135/83 (BP Location: Left Arm)   Pulse (!) 119   Temp 98.2 F (36.8 C) (Oral)   Resp 16   Ht 5\' 3"  (1.6 m)   Wt (!) 154.7 kg   SpO2 91%   BMI 60.41 kg/m  Physical Exam General: No acute distress Cardiac: Tachycardia with a regular rhythm.  Pulses are intact in all 4 extremities Lungs: Clear bilaterally Psych: Calm and cooperative  ED Course / MDM  EKG:EKG Interpretation Date/Time:  Monday September 20 2023 19:40:12 EST Ventricular Rate:  107 PR Interval:  120 QRS Duration:  74 QT Interval:  344 QTC Calculation: 459 R Axis:   50  Text Interpretation: Sinus tachycardia Otherwise normal ECG When compared with ECG of 12-Sep-2023 18:21, PREVIOUS ECG IS PRESENT Confirmed by Estanislado Pandy [phone]) on 09/21/2023 9:14:14 PM  I have reviewed the labs performed to date as well as medications administered while in observation.  Recent changes in the last 24 hours include none.  Plan  Current plan is for still waiting placement.    Gwyneth Sprout, MD 09/26/23 1200

## 2023-09-26 NOTE — ED Notes (Signed)
 Patient took a shower this AM.

## 2023-09-27 ENCOUNTER — Emergency Department (HOSPITAL_COMMUNITY): Payer: MEDICAID

## 2023-09-27 LAB — CBC WITH DIFFERENTIAL/PLATELET
Abs Immature Granulocytes: 0.04 10*3/uL (ref 0.00–0.07)
Basophils Absolute: 0.1 10*3/uL (ref 0.0–0.1)
Basophils Relative: 1 %
Eosinophils Absolute: 0.3 10*3/uL (ref 0.0–0.5)
Eosinophils Relative: 4 %
HCT: 38.6 % (ref 36.0–46.0)
Hemoglobin: 11.9 g/dL — ABNORMAL LOW (ref 12.0–15.0)
Immature Granulocytes: 1 %
Lymphocytes Relative: 37 %
Lymphs Abs: 2.4 10*3/uL (ref 0.7–4.0)
MCH: 27.4 pg (ref 26.0–34.0)
MCHC: 30.8 g/dL (ref 30.0–36.0)
MCV: 88.7 fL (ref 80.0–100.0)
Monocytes Absolute: 0.4 10*3/uL (ref 0.1–1.0)
Monocytes Relative: 6 %
Neutro Abs: 3.5 10*3/uL (ref 1.7–7.7)
Neutrophils Relative %: 51 %
Platelets: 252 10*3/uL (ref 150–400)
RBC: 4.35 MIL/uL (ref 3.87–5.11)
RDW: 13.2 % (ref 11.5–15.5)
WBC: 6.7 10*3/uL (ref 4.0–10.5)
nRBC: 0 % (ref 0.0–0.2)

## 2023-09-27 LAB — RESPIRATORY PANEL BY PCR

## 2023-09-27 LAB — BASIC METABOLIC PANEL
Anion gap: 9 (ref 5–15)
BUN: 13 mg/dL (ref 6–20)
CO2: 26 mmol/L (ref 22–32)
Calcium: 8.7 mg/dL — ABNORMAL LOW (ref 8.9–10.3)
Chloride: 105 mmol/L (ref 98–111)
Creatinine, Ser: 0.81 mg/dL (ref 0.44–1.00)
GFR, Estimated: >60 mL/min (ref >60)
Glucose, Bld: 122 mg/dL — ABNORMAL HIGH (ref 70–99)
Potassium: 4.2 mmol/L (ref 3.5–5.1)
Sodium: 140 mmol/L (ref 135–145)

## 2023-09-27 LAB — D-DIMER, QUANTITATIVE: D-Dimer, Quant: 0.3 ug{FEU}/mL (ref 0.00–0.50)

## 2023-09-27 LAB — TSH: TSH: 2.948 u[IU]/mL (ref 0.350–4.500)

## 2023-09-27 NOTE — ED Provider Notes (Signed)
 Emergency Medicine Observation Re-evaluation Note  Megan Manning is a 43 y.o. female, seen on rounds today.  Pt initially presented to the ED for complaints of neuropathy pain Currently, the patient is awake and alert.  Pt has been here since 12/20 waiting for placement.  She's been complaining of palpitations and cough. She had a covid/flu/rsv swab yesterday that was negative.  Pt has had a cough.  She has been intermittently refusing meds.    Physical Exam  BP (!) 149/78 (BP Location: Left Arm)   Pulse (!) 106   Temp 99 F (37.2 C) (Oral)   Resp 16   Ht 5\' 3"  (1.6 m)   Wt (!) 154.7 kg   SpO2 93%   BMI 60.41 kg/m  Physical Exam General: awake and alert Cardiac: rr Lungs: clear, but cough on exam Psych: calm  ED Course / MDM  EKG:EKG Interpretation Date/Time:  Monday September 20 2023 19:40:12 EST Ventricular Rate:  107 PR Interval:  120 QRS Duration:  74 QT Interval:  344 QTC Calculation: 459 R Axis:   50  Text Interpretation: Sinus tachycardia Otherwise normal ECG When compared with ECG of 12-Sep-2023 18:21, PREVIOUS ECG IS PRESENT Confirmed by Estanislado Pandy [phone]) on 09/21/2023 9:14:14 PM  I have reviewed the labs performed to date as well as medications administered while in observation.  Recent changes in the last 24 hours include cough, palpitations.  Plan  Current plan is for awaiting placement.  RVP, CXR, EKG, labs ordered.    EKG with ST.  Repeat labs unremarkable.  Ddimer neg.  TSH neg.  20 panel RVP pending.     Jacalyn Lefevre, MD 09/27/23 [phone]

## 2023-09-27 NOTE — ED Notes (Deleted)
Foley discontinue

## 2023-09-27 NOTE — ED Notes (Signed)
 Patient ambulatory to restroom  ?

## 2023-09-27 NOTE — ED Notes (Signed)
 While obtaining VS on pt, O2 saturation noted to be lower than normal. Pt also noted to be mildly tachycardic but afebrile. Pt encouraged to sit up on side of bed and cough and deep breathe, O2 sat returned to 91%. Pt educated on atelectasis and why she needed to cough and deep breathe. HOB increased to approx 85-85 degress and pt encourage to continue with cough and deep breathing while awake.  Pt given inhaler. BBS clear with auscultation, diminished in bases.

## 2023-09-27 NOTE — Progress Notes (Addendum)
 2:45pm: CSW spoke with Mardella Layman at [facility] who is requesting the patient's virtual interview be tomorrow at [employer].  CSW will facilitate meeting.  10:30am: CSW received call from [place] at [facility] [phone]) who states there is a facility [facility] in [place], [place] Edward Jolly @ [phone]) who may be able to offer patient a bed.  CSW spoke with Mardella Layman to discuss patient. Mardella Layman states facility staff wants to meet with patient virtually at some point today to meet her. Mardella Layman will speak with AFL provider and notify CSW when call is to take place.  8:10am: There are no discharge planning updates available at this time.  CSW will reach out to [facility] staff to determine if any updates are available.  Edwin Dada, MSW, LCSW Transitions of Care  Clinical Social Worker II [phone]

## 2023-09-28 NOTE — ED Provider Notes (Signed)
 Emergency Medicine Observation Re-evaluation Note  Megan Manning is a 43 y.o. female, seen on rounds today.  Pt initially presented to the ED for complaints of neuropathy pain Currently, the patient is sitting in bed after eating breakfast resting without distress.  Physical Exam  BP (!) 150/67   Pulse (!) 106   Temp 99 F (37.2 C) (Oral)   Resp 18   Ht 5\' 3"  (1.6 m)   Wt (!) 154.7 kg   SpO2 (!) 88%   BMI 60.41 kg/m  Physical Exam General: Sitting without agitation Cardiac: No murmur Lungs: Clear breath sounds bilaterally Psych: No agitation  ED Course / MDM  EKG:EKG Interpretation Date/Time:  Monday September 27 2023 08:17:56 EST Ventricular Rate:  112 PR Interval:  114 QRS Duration:  70 QT Interval:  320 QTC Calculation: 436 R Axis:   22  Text Interpretation: Sinus tachycardia Cannot rule out Anterior infarct , age undetermined Abnormal ECG When compared with ECG of 20-Sep-2023 19:40, PREVIOUS ECG IS PRESENT No significant change since last tracing Confirmed by Jacalyn Lefevre [phone]) on 09/27/2023 8:24:54 AM  I have reviewed the labs performed to date as well as medications administered while in observation.  Recent changes in the last 24 hours include patient had workup yesterday including a 20 panel respiratory virus panel that was negative, negative viral swab for COVID/flu/RSV, and x-ray did not show pneumonia.  Suspect other viral infection.  Plan  Current plan is for awaiting placement.    Minami Arriaga, Canary Brim, MD 09/28/23 [phone]

## 2023-09-28 NOTE — Discharge Instructions (Addendum)
 Marland Kitchen

## 2023-09-28 NOTE — ED Notes (Signed)
 Alert, no signs of distress. Complaints of mild anxiety. Medicated. Snack given.

## 2023-09-28 NOTE — ED Notes (Signed)
 Dr Andria Meuse to beside, informed of borderline O2 saturation. Teaching for cough and deep breathing reinforced for pt. Pt encouraged to get up to chair with meals and ambulate more often. Pt verbalized understanding.

## 2023-09-28 NOTE — Progress Notes (Addendum)
 10:25am: Interview was completed - CSW will inform patient of outcome of meeting once that information becomes available.  10am: CSW spoke with patient to inform her of scheduled meeting. Patient agreeable to participate and device was provided to patient for virtual interview.  8:15am: CSW spoke with RN to inform her of scheduled virtual interview for 10am.  Edwin Dada, MSW, LCSW Transitions of Care  Clinical Social Worker II [phone]

## 2023-09-28 NOTE — ED Notes (Signed)
 Patient doing virtual interview with [facility]

## 2023-09-29 MED ORDER — FUROSEMIDE 20 MG PO TABS
40.0000 mg | ORAL_TABLET | Freq: Once | ORAL | Status: AC
  Administered 2023-09-29: 40 mg via ORAL
  Filled 2023-09-29: qty 2

## 2023-09-29 NOTE — ED Provider Notes (Addendum)
 Emergency Medicine Observation Re-evaluation Note  Megan Manning is a 43 y.o. female, seen on rounds today.  Pt initially presented to the ED for complaints of neuropathy pain Currently, the patient is sitting in bed.  Physical Exam  BP (!) 130/53 (BP Location: Right Arm)   Pulse (!) 109   Temp 98.9 F (37.2 C) (Oral)   Resp 18   Ht 5\' 3"  (1.6 m)   Wt (!) 154.7 kg   SpO2 94%   BMI 60.41 kg/m  Physical Exam General: No acute distress Cardiac: Mild tachycardia Lungs: Clear to auscultation, no increased work of breathing Psych: Calm  ED Course / MDM  EKG:EKG Interpretation Date/Time:  Monday September 27 2023 08:17:56 EST Ventricular Rate:  112 PR Interval:  114 QRS Duration:  70 QT Interval:  320 QTC Calculation: 436 R Axis:   22  Text Interpretation: Sinus tachycardia Cannot rule out Anterior infarct , age undetermined Abnormal ECG When compared with ECG of 20-Sep-2023 19:40, PREVIOUS ECG IS PRESENT No significant change since last tracing Confirmed by Jacalyn Lefevre [phone]) on 09/27/2023 8:24:54 AM  I have reviewed the labs performed to date as well as medications administered while in observation.  Recent changes in the last 24 hours include leg swelling.  Patient advises that her legs are more swollen than normal.  On exam, she has large legs which I suspect is baseline.  However I do see that she previously was on Lasix.  Will give her a dose of Lasix.  She also reportedly is not taking her metoprolol.  Will encourage this.  Per her report, she states she was previously on Lasix but her doctor stopped it.  Will just order the one-time dose at this point.  Plan  Current plan is for assisted living placement.    Rolan Bucco, MD 09/29/23 [phone]    Rolan Bucco, MD 09/29/23 [phone]    Rolan Bucco, MD 09/29/23 1124

## 2023-09-29 NOTE — Progress Notes (Signed)
 CSW sent secure email to [facility] team to request updates regarding placement for patient.  Edwin Dada, MSW, LCSW Transitions of Care  Clinical Social Worker II [phone]

## 2023-09-30 NOTE — ED Provider Notes (Signed)
 Emergency Medicine Observation Re-evaluation Note  Megan Manning is a 43 y.o. female, seen on rounds today.  Pt initially presented to the ED for complaints of neuropathy pain Currently, the patient is resting in NAD.  Physical Exam  BP (!) 144/65 (BP Location: Right Arm)   Pulse (!) 113   Temp 98.4 F (36.9 C) (Oral)   Resp 18   Ht 5\' 3"  (1.6 m)   Wt (!) 154.7 kg   SpO2 94%   BMI 60.41 kg/m  Physical Exam General: Appears to be resting comfortably in bed, no acute distress. Cardiac: Regular rate, normal heart rate, non-emergent blood pressure for this morning's vitals. Lungs: No increased work of breathing.  Equal chest rise appreciated Psych: Calm, asleep in bed.   ED Course / MDM  EKG:EKG Interpretation Date/Time:  Monday September 27 2023 08:17:56 EST Ventricular Rate:  112 PR Interval:  114 QRS Duration:  70 QT Interval:  320 QTC Calculation: 436 R Axis:   22  Text Interpretation: Sinus tachycardia Cannot rule out Anterior infarct , age undetermined Abnormal ECG When compared with ECG of 20-Sep-2023 19:40, PREVIOUS ECG IS PRESENT No significant change since last tracing Confirmed by Jacalyn Lefevre [phone]) on 09/27/2023 8:24:54 AM  I have reviewed the labs performed to date as well as medications administered while in observation.  Recent changes in the last 24 hours include NA.  Plan  Since their initial presentation, this patient has been evaluated and deemed to not have an emergent psychiatric condition.  Transitions of Care team consultation for arrangement for safe disposition planning which is underway at this time.  Appreciate recommendations on disposition from [facility] team.     Glyn Ade, MD 09/30/23 [phone]

## 2023-09-30 NOTE — Progress Notes (Addendum)
 12:30pm: CSW spoke with Adele Schilder, QP [phone]) at [facility] who states there is no estimated discharge date as of today but will keep CSW updated once one becomes available. Prior to discharge, all patient medications will need to be filled by [facility] and a signed MAR will need to be sent to the provider for review.  8:50am: CSW received email from [place] at [facility] who states the agency is willing to accept patient and insurance authorization has been initiated. Lillia Abed will notify CSW with any updates that become available.  Edwin Dada, MSW, LCSW Transitions of Care  Clinical Social Worker II [phone]

## 2023-10-01 NOTE — ED Notes (Signed)
Refused lunch tray

## 2023-10-01 NOTE — ED Notes (Signed)
 Patient asked to take a shower items were giving to patient

## 2023-10-01 NOTE — ED Provider Notes (Signed)
 Emergency Medicine Observation Re-evaluation Note  Megan Manning is a 43 y.o. female, seen on rounds today.  Pt initially presented to the ED for complaints of needing new facility placement. Pt without new c/o this AM.  Physical Exam  BP (!) 124/54 (BP Location: Right Arm)   Pulse 99   Temp 98.2 F (36.8 C) (Oral)   Resp 18   Ht 1.6 m (5\' 3" )   Wt (!) 154.7 kg   SpO2 94%   BMI 60.41 kg/m  Physical Exam General: alert, content.  Cardiac: regular rate.  Lungs: breathing comfortably. Psych: calm. Normal mood/affect.   ED Course / MDM    I have reviewed the labs performed to date as well as medications administered while in observation.  Recent changes in the last 24 hours include ED obs, reassessment.   Plan  Pt has been boarding in ED awaiting new facility placement. Disposition per [facility] team.     Cathren Laine, MD 10/01/23 [phone]

## 2023-10-01 NOTE — ED Notes (Signed)
 Patient back in room done with shower

## 2023-10-01 NOTE — ED Provider Notes (Signed)
 Emergency Medicine Observation Re-evaluation Note  Megan Manning is a 43 y.o. female.  Pt initially presented to the ED for complaints of neuropathy pain Currently, the patient is resting  Physical Exam  BP (!) 124/54 (BP Location: Right Arm)   Pulse 99   Temp 98.2 F (36.8 C) (Oral)   Resp 18   Ht 5\' 3"  (1.6 m)   Wt (!) 154.7 kg   SpO2 94%   BMI 60.41 kg/m  Physical Exam General: NAD Cardiac: Mild tachycardia Lungs: No respiratory distress Psych: Stable  ED Course / MDM  EKG:EKG Interpretation Date/Time:  Monday September 27 2023 08:17:56 EST Ventricular Rate:  112 PR Interval:  114 QRS Duration:  70 QT Interval:  320 QTC Calculation: 436 R Axis:   22  Text Interpretation: Sinus tachycardia Cannot rule out Anterior infarct , age undetermined Abnormal ECG When compared with ECG of 20-Sep-2023 19:40, PREVIOUS ECG IS PRESENT No significant change since last tracing Confirmed by Jacalyn Lefevre [phone]) on 09/27/2023 8:24:54 AM  I have reviewed the labs performed to date as well as medications administered while in observation.   Plan  Current plan is for [facility] assistance with placement.    Terald Sleeper, MD 10/01/23 [phone]

## 2023-10-01 NOTE — ED Notes (Signed)
 Patient was giving to pack two packs of gram crackers

## 2023-10-01 NOTE — ED Notes (Signed)
Refused breakfast tray

## 2023-10-02 ENCOUNTER — Emergency Department (HOSPITAL_COMMUNITY): Payer: MEDICAID

## 2023-10-02 DIAGNOSIS — J9601 Acute respiratory failure with hypoxia: Secondary | ICD-10-CM | POA: Diagnosis present

## 2023-10-02 DIAGNOSIS — R0600 Dyspnea, unspecified: Secondary | ICD-10-CM | POA: Diagnosis not present

## 2023-10-02 DIAGNOSIS — J9811 Atelectasis: Secondary | ICD-10-CM | POA: Diagnosis present

## 2023-10-02 DIAGNOSIS — Z6841 Body Mass Index (BMI) 40.0 and over, adult: Secondary | ICD-10-CM | POA: Diagnosis not present

## 2023-10-02 DIAGNOSIS — E785 Hyperlipidemia, unspecified: Secondary | ICD-10-CM | POA: Diagnosis present

## 2023-10-02 DIAGNOSIS — J9612 Chronic respiratory failure with hypercapnia: Secondary | ICD-10-CM | POA: Diagnosis not present

## 2023-10-02 DIAGNOSIS — Z781 Physical restraint status: Secondary | ICD-10-CM | POA: Diagnosis not present

## 2023-10-02 DIAGNOSIS — D509 Iron deficiency anemia, unspecified: Secondary | ICD-10-CM | POA: Diagnosis present

## 2023-10-02 DIAGNOSIS — M7989 Other specified soft tissue disorders: Secondary | ICD-10-CM

## 2023-10-02 DIAGNOSIS — E662 Morbid (severe) obesity with alveolar hypoventilation: Secondary | ICD-10-CM | POA: Diagnosis present

## 2023-10-02 DIAGNOSIS — E039 Hypothyroidism, unspecified: Secondary | ICD-10-CM | POA: Diagnosis present

## 2023-10-02 DIAGNOSIS — Z1152 Encounter for screening for COVID-19: Secondary | ICD-10-CM | POA: Diagnosis not present

## 2023-10-02 DIAGNOSIS — R451 Restlessness and agitation: Secondary | ICD-10-CM | POA: Diagnosis present

## 2023-10-02 DIAGNOSIS — F319 Bipolar disorder, unspecified: Secondary | ICD-10-CM | POA: Diagnosis present

## 2023-10-02 DIAGNOSIS — Z23 Encounter for immunization: Secondary | ICD-10-CM | POA: Diagnosis not present

## 2023-10-02 DIAGNOSIS — R112 Nausea with vomiting, unspecified: Secondary | ICD-10-CM | POA: Diagnosis present

## 2023-10-02 DIAGNOSIS — I5022 Chronic systolic (congestive) heart failure: Secondary | ICD-10-CM | POA: Diagnosis present

## 2023-10-02 DIAGNOSIS — I110 Hypertensive heart disease with heart failure: Secondary | ICD-10-CM | POA: Diagnosis present

## 2023-10-02 DIAGNOSIS — R0603 Acute respiratory distress: Secondary | ICD-10-CM | POA: Diagnosis not present

## 2023-10-02 DIAGNOSIS — I10 Essential (primary) hypertension: Secondary | ICD-10-CM | POA: Insufficient documentation

## 2023-10-02 DIAGNOSIS — R051 Acute cough: Secondary | ICD-10-CM | POA: Diagnosis not present

## 2023-10-02 DIAGNOSIS — F79 Unspecified intellectual disabilities: Secondary | ICD-10-CM | POA: Diagnosis present

## 2023-10-02 DIAGNOSIS — R000 Tachycardia, unspecified: Secondary | ICD-10-CM | POA: Diagnosis present

## 2023-10-02 DIAGNOSIS — K5900 Constipation, unspecified: Secondary | ICD-10-CM | POA: Diagnosis present

## 2023-10-02 DIAGNOSIS — F603 Borderline personality disorder: Secondary | ICD-10-CM | POA: Diagnosis present

## 2023-10-02 DIAGNOSIS — R0902 Hypoxemia: Secondary | ICD-10-CM | POA: Diagnosis not present

## 2023-10-02 DIAGNOSIS — Z7289 Other problems related to lifestyle: Secondary | ICD-10-CM | POA: Diagnosis not present

## 2023-10-02 DIAGNOSIS — E1140 Type 2 diabetes mellitus with diabetic neuropathy, unspecified: Secondary | ICD-10-CM | POA: Diagnosis present

## 2023-10-02 DIAGNOSIS — Z751 Person awaiting admission to adequate facility elsewhere: Secondary | ICD-10-CM | POA: Diagnosis not present

## 2023-10-02 DIAGNOSIS — J45901 Unspecified asthma with (acute) exacerbation: Secondary | ICD-10-CM | POA: Diagnosis present

## 2023-10-02 DIAGNOSIS — R0602 Shortness of breath: Secondary | ICD-10-CM | POA: Diagnosis present

## 2023-10-02 DIAGNOSIS — Z593 Problems related to living in residential institution: Secondary | ICD-10-CM | POA: Diagnosis not present

## 2023-10-02 DIAGNOSIS — F5083 Pica in adults: Secondary | ICD-10-CM | POA: Diagnosis present

## 2023-10-02 HISTORY — DX: Hypoxemia: R09.02

## 2023-10-02 LAB — ECHOCARDIOGRAM COMPLETE
Area-P 1/2: 7.37 cm2
Est EF: 55
Height: 63 in
P 1/2 time: 260 ms
S' Lateral: 2.8 cm
Single Plane A4C EF: 46.6 %
Weight: 5456 [oz_av]

## 2023-10-02 LAB — RESPIRATORY PANEL BY PCR

## 2023-10-02 LAB — TROPONIN I (HIGH SENSITIVITY)
Troponin I (High Sensitivity): 4 ng/L (ref <18)
Troponin I (High Sensitivity): 4 ng/L (ref <18)

## 2023-10-02 LAB — CBC WITH DIFFERENTIAL/PLATELET
Abs Immature Granulocytes: 0.03 10*3/uL (ref 0.00–0.07)
Basophils Absolute: 0 10*3/uL (ref 0.0–0.1)
Basophils Relative: 1 %
Eosinophils Absolute: 0.3 10*3/uL (ref 0.0–0.5)
Eosinophils Relative: 5 %
HCT: 38.6 % (ref 36.0–46.0)
Hemoglobin: 11.5 g/dL — ABNORMAL LOW (ref 12.0–15.0)
Immature Granulocytes: 1 %
Lymphocytes Relative: 36 %
Lymphs Abs: 2.2 10*3/uL (ref 0.7–4.0)
MCH: 26.6 pg (ref 26.0–34.0)
MCHC: 29.8 g/dL — ABNORMAL LOW (ref 30.0–36.0)
MCV: 89.4 fL (ref 80.0–100.0)
Monocytes Absolute: 0.4 10*3/uL (ref 0.1–1.0)
Monocytes Relative: 7 %
Neutro Abs: 3.1 10*3/uL (ref 1.7–7.7)
Neutrophils Relative %: 50 %
Platelets: 221 10*3/uL (ref 150–400)
RBC: 4.32 MIL/uL (ref 3.87–5.11)
RDW: 13.2 % (ref 11.5–15.5)
WBC: 6 10*3/uL (ref 4.0–10.5)
nRBC: 0 % (ref 0.0–0.2)

## 2023-10-02 LAB — BASIC METABOLIC PANEL
Anion gap: 8 (ref 5–15)
BUN: 9 mg/dL (ref 6–20)
CO2: 27 mmol/L (ref 22–32)
Calcium: 8.7 mg/dL — ABNORMAL LOW (ref 8.9–10.3)
Chloride: 107 mmol/L (ref 98–111)
Creatinine, Ser: 0.8 mg/dL (ref 0.44–1.00)
GFR, Estimated: >60 mL/min (ref >60)
Glucose, Bld: 124 mg/dL — ABNORMAL HIGH (ref 70–99)
Potassium: 4.5 mmol/L (ref 3.5–5.1)
Sodium: 142 mmol/L (ref 135–145)

## 2023-10-02 LAB — I-STAT ARTERIAL BLOOD GAS, ED
Acid-Base Excess: 3 mmol/L — ABNORMAL HIGH (ref 0.0–2.0)
Bicarbonate: 28.6 mmol/L — ABNORMAL HIGH (ref 20.0–28.0)
Calcium, Ion: 1.23 mmol/L (ref 1.15–1.40)
HCT: 34 % — ABNORMAL LOW (ref 36.0–46.0)
Hemoglobin: 11.6 g/dL — ABNORMAL LOW (ref 12.0–15.0)
O2 Saturation: 93 %
Potassium: 4.3 mmol/L (ref 3.5–5.1)
Sodium: 143 mmol/L (ref 135–145)
TCO2: 30 mmol/L (ref 22–32)
pCO2 arterial: 49.8 mmHg — ABNORMAL HIGH (ref 32–48)
pH, Arterial: 7.367 (ref 7.35–7.45)
pO2, Arterial: 69 mmHg — ABNORMAL LOW (ref 83–108)

## 2023-10-02 LAB — D-DIMER, QUANTITATIVE: D-Dimer, Quant: 0.45 ug{FEU}/mL (ref 0.00–0.50)

## 2023-10-02 LAB — RESP PANEL BY RT-PCR (RSV, FLU A&B, COVID)  RVPGX2
Influenza A by PCR: NEGATIVE
Influenza B by PCR: NEGATIVE
Resp Syncytial Virus by PCR: NEGATIVE
SARS Coronavirus 2 by RT PCR: NEGATIVE

## 2023-10-02 MED ORDER — IOHEXOL 350 MG/ML SOLN
75.0000 mL | Freq: Once | INTRAVENOUS | Status: AC | PRN
  Administered 2023-10-02: 175 mL via INTRAVENOUS

## 2023-10-02 MED ORDER — MAGNESIUM SULFATE 2 GM/50ML IV SOLN
2.0000 g | Freq: Once | INTRAVENOUS | Status: AC
  Administered 2023-10-02: 2 g via INTRAVENOUS
  Filled 2023-10-02: qty 50

## 2023-10-02 MED ORDER — IPRATROPIUM-ALBUTEROL 0.5-2.5 (3) MG/3ML IN SOLN
3.0000 mL | RESPIRATORY_TRACT | Status: DC
  Administered 2023-10-02 – 2023-10-03 (×4): 3 mL via RESPIRATORY_TRACT
  Filled 2023-10-02 (×4): qty 3

## 2023-10-02 MED ORDER — ENOXAPARIN SODIUM 80 MG/0.8ML IJ SOSY
70.0000 mg | PREFILLED_SYRINGE | INTRAMUSCULAR | Status: DC
  Administered 2023-10-02 – 2023-10-03 (×2): 70 mg via SUBCUTANEOUS
  Filled 2023-10-02: qty 0.7
  Filled 2023-10-02: qty 0.8
  Filled 2023-10-02: qty 0.7

## 2023-10-02 NOTE — ED Notes (Signed)
Vasc at bedside.

## 2023-10-02 NOTE — H&P (Signed)
 Hospital Admission History and Physical Service Pager: [phone]  Patient name: Megan Manning Medical record number: [id number] Date of Birth: 1980-11-23 Age: 43 y.o. Gender: female  Primary Care Provider: Angelica Pou, NP Consultants: none Code Status: Full code Preferred Emergency Contact:  Contact Information     Name Relation Home Work Mobile   [place] Other   [phone]      Other Contacts   None on File    Grandmother Ms. Dareen Piano [phone]  Chief Complaint: Shortness of breath  Assessment and Plan: Megan Manning is a 43 y.o. female presenting with SOB and hypoxia. Differential for presentation of this includes asthma exacerbation, worsening OSA complicated by OHS, supratherapeutic hypothyroidism, deconditioning.  PE/CHF exacerbation ruled out with normal echo, negative CTPE, bilateral LE U/S, D-Dimer, and troponin.   Assessment & Plan Hypoxia Anxious appearing with increased work of breathing and mild diffuse expiratory wheezing on exam. Most likely secondary to asthma exacerbation possibly from viral insult. 20 pathogen panel pending. Possible component of deconditioning as she has been in the hospital since 07/16/23.  Reviewed telemetry which shows sinus tachycardia.  - Admit to FMTS, attending Dr. Jennette Kettle  - Med-tele, Vital signs per floor - Regular diet  - VTE prophylaxis Lovenox  - AM CBC/BMP  - Fall precautions - Delirium precautions  - DuoNebs Q4H scheduled  - Consider steroids, risk v benefit with known psychiatric diagnosis - Wean O2 as tolerated  - Consult respiratory therapy, needs CPAP per patient at night - Repeat EKG  - recheck TSH, T4  Hypertension Blood pressure elevated today.  On prior review of blood pressures while she has been hospitalized they have been within normal limits.  Suspect that elevated pressures are due to anxiety.  - Monitor blood pressure, if it continues to be elevated above 140/90 consider adding losartan.   Self-injurious behavior Originally boarded in the ED for self-injurious behaviors.  Allegedly patient attempted to swallow a plastic bottle and pieces of cardboard.  Psychiatry was consulted in February and patient was psychiatrically cleared and now awaiting placement.  Most recently IVC'd December 2024 at [facility].  Currently stable on Zoloft 50 mg, Seroquel 200 mg daily at bedtime, doxazosin 2 mg daily at bedtime, Depakote 500 mg daily. - TOC for placement - Cont sitter for safety  Chronic and Stable Problems:  T2DM: Last A1c 5.6, 5 months ago, patient denies being on medication.  OSA: needs CPAP but has not had it in the hospital, consult to RT  HLD: cont Lipitor daily  GERD: Cont Protonix 40 mg daily Hypothyroidism: cont synthroid   FEN/GI: Regular diet VTE Prophylaxis: Lovenox   Disposition: MedSurg, attending Dr. Jennette Kettle  History of Present Illness:  Megan Manning is a 42 y.o. female presenting with SOB and chest pain today after boarding in ED for placement since 07/15/2023 (presented from ALF after self injurious behavior- swallowed foreign objects).   Per patient she began having a sharp chest pain last night 3/7.  This pain has continued throughout today and is located in the middle of her chest.  It does not radiate.  It gets worse when she moves or gets out of bed.  Associated with increased work of breathing shortness of breath and palpitations.  She describes feeling like her heart is racing.  She has had associated chills, but denies body aches.  2 weeks ago she hurt her back while showering, since then she has been laying in bed more frequently due to pain.  Denies hx of heart problems, never been on blood thinners. Says she used to use a daily inhaler and wear a CPAP at night  In the ED, desatted to mid/high 80's this morning with SOB and chest pain.  Currently on 4 L oxygen satting in the low 90s.  CT PE obtained which was a difficult study to interpret due to body  habitus.  D-dimer negative.  Vascular lower extremity Dopplers negative for DVT.  Echocardiogram stable from prior with mild HFrEF with EF of 50 to 55%.  Quad screen negative.  ABG notable for increased pCO2 to 49.8 and normal pH.  Troponins negative.  She has been afebrile with no leukocytosis.  Review Of Systems: Per HPI with the following additions: as above  Pertinent Past Medical History: BPD DM HTN Intellectual disability Lymphedema  Hypothyroidism Remainder reviewed in history tab.   Pertinent Past Surgical History: None  Pertinent Social History: Tobacco use: No Alcohol use: No Other Substance use: No Lived at ALF prior to ED  Pertinent Family History: Grandmother: unspecified heart problems   Important Outpatient Medications: Albuterol as needed for wheezing Atorvastatin 20 mg daily Depakote 500 mg daily Doxazosin 2 mg daily at bedtime Synthroid 88 mcg daily Claritin 10 mg Protonix 40 mg Seroquel 200 mg daily at bedtime Zoloft 50 mg daily Remainder reviewed in medication history.   Objective: BP (!) 180/157 (BP Location: Right Arm)   Pulse 94   Temp 99.2 F (37.3 C) (Oral)   Resp (!) 23   Ht 5\' 3"  (1.6 m)   Wt (!) 154.7 kg   SpO2 91%   BMI 60.41 kg/m  Exam: Chronically ill-appearing, no acute distress, anxious Cardio: Regular rate, regular rhythm, no murmurs on exam. Pulm: Clear, no wheezing, no crackles. No increased work of breathing Abdominal: bowel sounds present, soft, non-tender, non-distended Extremities: Bilateral chronic lymphedema Neuro: alert and oriented x3, speech normal in content, no facial asymmetry, strength intact and equal bilaterally in UE and LE, pupils equal and reactive to light.   Labs:  CBC BMET  Recent Labs  Lab 10/02/23 1130 10/02/23 1414  WBC 6.0  --   HGB 11.5* 11.6*  HCT 38.6 34.0*  PLT 221  --    Recent Labs  Lab 10/02/23 1130 10/02/23 1414  NA 142 143  K 4.5 4.3  CL 107  --   CO2 27  --   BUN 9  --    CREATININE 0.80  --   GLUCOSE 124*  --   CALCIUM 8.7*  --     Pertinent additional labs pCO2 49.8, pH 7.36, negative troponin, D-dimer negative, quad screen negative.   EKG: Reviewed prior EKG on 09/28/2023, sinus tachycardia, normal axis without deviation, normal intervals, no ST changes.  Stable when compared to prior EKGs.   Imaging Studies Performed:  Echocardiogram: EF 55% no wall motion abnormalities, normal valves.  Lower extremity Dopplers negative for DVT  CTA: Due to inadequate opacification of the pulmonary artery despite multiple times as well as artifact secondary to body habitus pulmonary emboli cannot be excluded on the basis of this exam.  Stable elevated right hemidiaphragm is noted with mild right basilar atelectasis   Glendale Chard, DO 10/02/2023, 7:37 PM PGY-2, [facility]  FPTS Intern pager: [phone], text pages welcome Secure chat group [facility] Teaching Service

## 2023-10-02 NOTE — ED Notes (Signed)
 Sitter at bedside.

## 2023-10-02 NOTE — ED Notes (Signed)
 This RN called staffing regarding lack of sitter since 1500. Per staffing office, no available sitters at this time.

## 2023-10-02 NOTE — Progress Notes (Signed)
  Echocardiogram 2D Echocardiogram has been performed.  Delcie Roch 10/02/2023, 3:57 PM

## 2023-10-02 NOTE — ED Notes (Signed)
 Unsuccessful IV attempt by this RN.

## 2023-10-02 NOTE — ED Notes (Signed)
 Pt ambulated independently to restroom & back to treatment room.

## 2023-10-02 NOTE — Assessment & Plan Note (Addendum)
 Anxious appearing with increased work of breathing and mild diffuse expiratory wheezing on exam. Most likely secondary to asthma exacerbation possibly from viral insult. 20 pathogen panel pending. Possible component of deconditioning as she has been in the hospital since 07/16/23.  Reviewed telemetry which shows sinus tachycardia.  - Admit to FMTS, attending Dr. Jennette Kettle  - Med-tele, Vital signs per floor - Regular diet  - VTE prophylaxis Lovenox  - AM CBC/BMP  - Fall precautions - Delirium precautions  - DuoNebs Q4H scheduled  - Consider steroids, risk v benefit with known psychiatric diagnosis - Wean O2 as tolerated  - Consult respiratory therapy, needs CPAP per patient at night - Repeat EKG  - recheck TSH, T4

## 2023-10-02 NOTE — ED Notes (Addendum)
 Patient's oxygen turned off for room air saturation per MD [place]. Patient's oxygen saturation at 86 on room air, MD notified.

## 2023-10-02 NOTE — ED Notes (Signed)
 [place] MD notified of pt low oxygen saturation. Pt denies SOB. Good color, clear lung sounds. Pulse oximetry tried on both hands with the same reading. Pt reports she has had to wear oxygen in the past but she does not remember what for. Pt placed on 2L [place].

## 2023-10-02 NOTE — Assessment & Plan Note (Addendum)
 Blood pressure elevated today.  On prior review of blood pressures while she has been hospitalized they have been within normal limits.  Suspect that elevated pressures are due to anxiety.  - Monitor blood pressure, if it continues to be elevated above 140/90 consider adding losartan.

## 2023-10-02 NOTE — Progress Notes (Signed)
 BLE venous duplex has been completed.  Preliminary results given to Dr. Particia Nearing.   Results can be found under chart review under CV PROC. 10/02/2023 4:19 PM Kimie Pidcock RVT, RDMS

## 2023-10-02 NOTE — Assessment & Plan Note (Addendum)
 Originally boarded in the ED for self-injurious behaviors.  Allegedly patient attempted to swallow a plastic bottle and pieces of cardboard.  Psychiatry was consulted in February and patient was psychiatrically cleared and now awaiting placement.  Most recently IVC'd December 2024 at [facility].  Currently stable on Zoloft 50 mg, Seroquel 200 mg daily at bedtime, doxazosin 2 mg daily at bedtime, Depakote 500 mg daily. - TOC for placement - Cont sitter for safety

## 2023-10-02 NOTE — ED Notes (Signed)
 Troponin added on by Chrissie Noa in lab

## 2023-10-02 NOTE — ED Provider Notes (Addendum)
 Emergency Medicine Observation Re-evaluation Note  Megan Manning is a 43 y.o. female, seen on rounds today.  Pt initially presented to the ED for complaints of neuropathy pain Currently, the patient is asleep.  Pt has been here for since 12/20 awaiting placement.  There is a note from SW from 3/6 saying that "[facility]" may accept her.  All patient meds will need to be filled by the [facility] pharmacy prior to d/c.  Physical Exam  BP 125/67 (BP Location: Right Wrist)   Pulse (!) 105   Temp 98.5 F (36.9 C) (Oral)   Resp 18   Ht 5\' 3"  (1.6 m)   Wt (!) 154.7 kg   SpO2 92%   BMI 60.41 kg/m  Physical Exam General: asleep Cardiac: rr Lungs: clear Psych: asleep  ED Course / MDM  EKG:EKG Interpretation Date/Time:  Monday September 27 2023 08:17:56 EST Ventricular Rate:  112 PR Interval:  114 QRS Duration:  70 QT Interval:  320 QTC Calculation: 436 R Axis:   22  Text Interpretation: Sinus tachycardia Cannot rule out Anterior infarct , age undetermined Abnormal ECG When compared with ECG of 20-Sep-2023 19:40, PREVIOUS ECG IS PRESENT No significant change since last tracing Confirmed by Jacalyn Lefevre [phone]) on 09/27/2023 8:24:54 AM  I have reviewed the labs performed to date as well as medications administered while in observation.  Recent changes in the last 24 hours include none.  Plan  Current plan is for group home placement.    Jacalyn Lefevre, MD 10/02/23 [phone]  Pt's nurse let me know that she had a low O2 sat (86%) and required placement of oxygen.  She has been persistently tachy since admission, but not hypoxic.  I repeated labs.  ABG with pO2 69 on 2L, cbc nl other than chronic anemia, nl bmp.  CTA chest ordered and was not diagnostic.    [name] neg for DVT.  DDimer neg, so I doubt PE.  Trop nl.  ECHO Pending.    Jacalyn Lefevre, MD 10/02/23 1500

## 2023-10-02 NOTE — ED Notes (Signed)
Oxygen increased to 5LNC.

## 2023-10-02 NOTE — Progress Notes (Signed)
 Pt had increased WOB half way through neb tx. RT waited to see if the pt's breathing would slow, but pt became more labored. Pt placed on BiPAP 07/01/49%. MD aware.

## 2023-10-02 NOTE — Progress Notes (Signed)
   10/02/23 2118  BiPAP/CPAP/SIPAP  $ Non-Invasive Ventilator  Non-Invasive Vent Set Up;Non-Invasive Vent Initial  $ Face Mask Small Yes  BiPAP/CPAP/SIPAP Pt Type Adult  BiPAP/CPAP/SIPAP SERVO  Mask Type Full face mask  Dentures removed? Not applicable  Mask Size Small  Respiratory Rate 44 breaths/min  IPAP 12 cmH20  EPAP 6 cmH2O  PEEP 6 cmH20  FiO2 (%) 50 %  Minute Ventilation 25.5  Leak 9  Peak Inspiratory Pressure (PIP) 12  Tidal Volume (Vt) 492  Patient Home Equipment No  Auto Titrate No

## 2023-10-02 NOTE — ED Provider Notes (Signed)
 Patient's echo without any acute findings.  Also Doppler studies of legs and D-dimer negative.  The patient really is short of breath.  Requiring 4 L of oxygen only satting 92% still remaining a little tachycardic.  Patient states she does not have oxygen at home.  Feel that she is going to need a medical admission for the hypoxia.  We do not have a good explanation for that.  Will discuss with admitting team.   Vanetta Mulders, MD 10/02/23 1816

## 2023-10-02 NOTE — Plan of Care (Signed)
 Went to bedside with Dr. Yetta Barre to assess patient.  Patient was resting comfortably on BiPAP.  O2 saturation noted to be in the high 90s, heart rate 92, respiratory rate 16.  Continue BiPAP to support patient's work of breathing. No new orders at this time. If she has increased WOB will order IV steroids. Cont DuoNebs Q4H.   Glendale Chard, DO [facility], PGY-2 10/02/23 10:03 PM

## 2023-10-02 NOTE — ED Notes (Signed)
 Patient transported to CT

## 2023-10-03 DIAGNOSIS — Z789 Other specified health status: Secondary | ICD-10-CM

## 2023-10-03 DIAGNOSIS — Z7289 Other problems related to lifestyle: Secondary | ICD-10-CM

## 2023-10-03 DIAGNOSIS — R0602 Shortness of breath: Secondary | ICD-10-CM | POA: Diagnosis not present

## 2023-10-03 LAB — BASIC METABOLIC PANEL
Anion gap: 9 (ref 5–15)
BUN: 8 mg/dL (ref 6–20)
CO2: 25 mmol/L (ref 22–32)
Calcium: 9.4 mg/dL (ref 8.9–10.3)
Chloride: 105 mmol/L (ref 98–111)
Creatinine, Ser: 0.76 mg/dL (ref 0.44–1.00)
GFR, Estimated: >60 mL/min (ref >60)
Glucose, Bld: 136 mg/dL — ABNORMAL HIGH (ref 70–99)
Potassium: 4.5 mmol/L (ref 3.5–5.1)
Sodium: 139 mmol/L (ref 135–145)

## 2023-10-03 LAB — CBC WITH DIFFERENTIAL/PLATELET
Abs Immature Granulocytes: 0.03 10*3/uL (ref 0.00–0.07)
Basophils Absolute: 0 10*3/uL (ref 0.0–0.1)
Basophils Relative: 0 %
Eosinophils Absolute: 0 10*3/uL (ref 0.0–0.5)
Eosinophils Relative: 0 %
HCT: 37.9 % (ref 36.0–46.0)
Hemoglobin: 11.6 g/dL — ABNORMAL LOW (ref 12.0–15.0)
Immature Granulocytes: 0 %
Lymphocytes Relative: 20 %
Lymphs Abs: 1.4 10*3/uL (ref 0.7–4.0)
MCH: 26.5 pg (ref 26.0–34.0)
MCHC: 30.6 g/dL (ref 30.0–36.0)
MCV: 86.7 fL (ref 80.0–100.0)
Monocytes Absolute: 0.3 10*3/uL (ref 0.1–1.0)
Monocytes Relative: 4 %
Neutro Abs: 5.3 10*3/uL (ref 1.7–7.7)
Neutrophils Relative %: 76 %
Platelets: 234 10*3/uL (ref 150–400)
RBC: 4.37 MIL/uL (ref 3.87–5.11)
RDW: 13.1 % (ref 11.5–15.5)
WBC: 7 10*3/uL (ref 4.0–10.5)
nRBC: 0 % (ref 0.0–0.2)

## 2023-10-03 LAB — T4, FREE: Free T4: 0.77 ng/dL (ref 0.61–1.12)

## 2023-10-03 LAB — TSH: TSH: 1.592 u[IU]/mL (ref 0.350–4.500)

## 2023-10-03 LAB — HEMOGLOBIN A1C
Hgb A1c MFr Bld: 6 % — ABNORMAL HIGH (ref 4.8–5.6)
Mean Plasma Glucose: 125.5 mg/dL

## 2023-10-03 MED ORDER — LAMOTRIGINE 25 MG PO TABS: 50.0000 mg | ORAL_TABLET | Freq: Every day | ORAL | Status: DC

## 2023-10-03 MED ORDER — LAMOTRIGINE 25 MG PO TABS
25.0000 mg | ORAL_TABLET | Freq: Every day | ORAL | Status: DC
  Administered 2023-10-03 – 2023-10-13 (×11): 25 mg via ORAL
  Filled 2023-10-03 (×11): qty 1

## 2023-10-03 MED ORDER — METHYLPREDNISOLONE SODIUM SUCC 40 MG IJ SOLR
40.0000 mg | Freq: Once | INTRAMUSCULAR | Status: AC
  Administered 2023-10-03: 40 mg via INTRAVENOUS
  Filled 2023-10-03: qty 1

## 2023-10-03 MED ORDER — IPRATROPIUM-ALBUTEROL 0.5-2.5 (3) MG/3ML IN SOLN
3.0000 mL | Freq: Three times a day (TID) | RESPIRATORY_TRACT | Status: DC
  Administered 2023-10-03 – 2023-10-04 (×2): 3 mL via RESPIRATORY_TRACT
  Filled 2023-10-03 (×2): qty 3

## 2023-10-03 MED ORDER — SERTRALINE HCL 100 MG PO TABS
100.0000 mg | ORAL_TABLET | Freq: Every day | ORAL | Status: DC
  Administered 2023-10-03 – 2023-10-12 (×10): 100 mg via ORAL
  Filled 2023-10-03 (×10): qty 1

## 2023-10-03 NOTE — Progress Notes (Signed)
 Pt was weaned off BiPAP to [place] and is tolerating well @ 5L. No increase WOB noted and Pt states her breathing is ok now. Bipap on standby if needed.

## 2023-10-03 NOTE — Hospital Course (Addendum)
 Megan Manning is a 43 y.o. female with PMH of developmental delay, bipolar disorder, OHS, asthma, T2DM, GERD, and hypothyroidism who was admitted to the [facility] Medicine Teaching Service with AHRF secondary asthma exacerbation and phrenic nerve palsy.  Her hospital course is outlined below.  Shortness of breath The patient became short of breath 3 days prior to call for admission.  She then experienced episode of hypoxia on room air, requiring 4L nasal cannula, saturating in low 90s on RA. Extensive workup done in the ED, including CT PE, which was undeterminable due to body habitus.  D-dimer was WNL and vascular lower extremity Dopplers were negative for DVT.  Echocardiogram was stable from prior with mild HFrEF (EF of 50 to 55%).  Respiratory 20 pathogen panel and COVID were negative.  ABG notable for increased pCO2 and a normal pH.  Troponins were negative and EKG was consistent with priors.  On admission, she had diffuse expiratory wheezing.  She was started on DuoNebs every 4 hours and given a low-dose of Solu-Medrol (completed 5 day course) due to her congruent psychiatric issues.  She was later switched to Xopenex due to tachycardia.  She was started on BiPAP due to increased respiratory rate.  While on BiPAP, her respiratory status improved.  She remained on BiPAP until 3/8, as she appeared to be trying to resist the breaths from the BIPAP.  Afterwards, patient remained intermittently hypoxic requiring oxygen supplementation.  Pulmonology was consulted and recommended starting Anoro Ellipta daily as well as Z-Pak for possible bronchitis (completed 5 day course).  Suspect respiratory symptoms were due to combination of obesity hypoventilation, underlying asthma, and deconditioning in ED.  Her respiratory status improved with nebulizers and by time of discharge, patient was requiring 2L O2 by nasal canula intermittently and was discharged on this supplemental oxygen.  Impulsive Behaviors Patient was  initially brought to the ED in December 2024 for for behavioral health concerns, and chart review shows she has previously swallowed foreign body such as her hospital band, Band-Aids, [employer] fry box, etc.  She was boarding in the ED for approximately 75 days awaiting placement at group home facility.  Iron panel obtained for pica and did not show iron deficiency.  Psychiatry was consulted, who determined need for 1:1 sitter, but no indication for psychiatric hospitalization, as behaviors were purely impulses related to her developmental disorder rather than underlying psychiatric condition.  Also recommended Seroquel 200 mg at bedtime, increasing sertraline to 100 mg daily, lamotrigine 25 mg AM and 50 mg PM, and discontinuing Depakote.  No self-injurious or impulsive behaviors were observed while inpatient.  Psychiatry was reconsulted during hospitalization and determined no further intervention.  1:1 sitter was discontinued on 3/17 PM, >24 hours prior to discharge, and patient did well with no behavioral issues.  Was not able to get SNF placement and will discharge to group home with DME manual wheelchair, walker, and 3 in 1.  Shared decision-making with legal guardian was undertaken prior to discharge concerning dispo planning and all were in agreement.  Hypertension Intermittent elevations of blood pressure suspected to be in setting of anxiety, as they are often accompanied by tachycardia.  Overall, blood pressures remained normal to mildly hypertensive this admission.  Vaginal itching Developed vaginal itching and burning with urination on 3/10.  Urinalysis unremarkable.  Patient stated symptoms were consistent with prior yeast infections.  Unable to perform full pelvic exam in hospital bed.  Patient was treated empirically with Diflucan 100mg  every other day x3 doses  with resolution of symptoms.  Obesity  Weight loss Was able to get approved for Wegovy starter dose 0.25 mg for 1 month to assist  with weight loss, which was prescribed at discharge.  Will need Prior Auth for further doses.  Other chronic conditions were medically managed with home medications and formulary alternatives as necessary (T2DM, OSA, GERD, hypothyroidism).  PCP Follow Up: Legal Guardian Megan Manning [phone]). Please refer for outpatient physical therapy, did not qualify for SNF and no Home Health benefit with insurance. Will need Prior Auth for refills or increasing dosage of Wegovy, which was started at discharge after PA approval for 1 month. Please follow up respiratory status, deconditioning. Discontinued clonidine due to concern for off target effects, may need antihypertensive meds adjustment.

## 2023-10-03 NOTE — Assessment & Plan Note (Addendum)
 Hypoxia resolved with oxygen supplementation and asthma treatment.  CTAB this a.m. - Recommend continue telemetry given acuity of hypoxia and intermittent tachycardia - Recommend discontinuation of BiPAP during daytime, CPAP or BiPAP only with sleep - Regular diet  - AM CBC/BMP  - Fall precautions - Delirium precautions  - DuoNebs Q4H scheduled  - Consider steroids, risk v benefit with known psychiatric diagnosis - Wean O2 as tolerated

## 2023-10-03 NOTE — Assessment & Plan Note (Signed)
 T2DM: Last A1c 5.65 months ago.  No medications. OSA: Needs CPAP at night HLD: Continue Lipitor daily GERD: Continue Protonix 40 mg daily Hypothyroidism: Continue Synthroid

## 2023-10-03 NOTE — Consult Note (Addendum)
 [name] is a 43 year old African-American female with a past psychiatric history significant for borderline personality disorder, intellectual disability, PICA, and adjustment disorder, who currently resides in the [facility] emergency department as a border while awaiting group home placement, due to losing previous living arrangements in an AFL from unsafe and unmanageable behaviors, resulting in the need for a higher level of care.   Patient originally seen at the [facility] 07/15/23 by Dr. Theodis Aguas, MD for the initial chief complaint of ingesting foreign objects after becoming upset at her ALF, and upon completion of evaluation, patient was sent to the [facility] emergency department for emergency medical clearance, due to endorsements of ingestion of foreign body. Patient was then medically cleared and sent back to the [facility], where she was seen by Mr. Sindy Guadeloupe and psychiatrically cleared for return back to AFL, who after returning back to her AFL endorsed ingestion of swallowing bracelets, which led to returning back to the [facility] briefly, before being directed back again to the [facility] emergency department for medical clearance, due to swallowing foreign object. Psychiatry was then consulted at the [facility] emergency department.   While at [facility] ED for psychiatric consultation after medical clearance, patient was seen by [name] Dr. Arther Dames on 07/16/23 for the initial chief complaint of ingesting foreign objects after becoming upset at her ALF, and Dr. Arther Dames psychiatrically cleared the patient, on the grounds of the patient does not meet inpatient psychiatric criteria and/or holding criteria (I.e., history of IDD, expected behavioral disturbances as a part of the patient's chronic illness course of IDD).   Psychiatry was then shortly reconsulted due to a recrudescence of intermittent periods of increased agitations and continued chronic behavior of attempting to try to swallow  foreign objects such as EKG wires and Styrofoam cups, who upon reconsultation with [name], NP, was ultimately repsychiatrically cleared (endorsed reconsultation not needed or warranted), due to largely the same conclusion, that the patient's behaviors and incidence of behavioral disturbances are not expected to change, as they are expected as a part of the patient's chronic illness course of IDD, and that the recommendations would be for tighter and more strict adherence to safety measures, while awaiting return to AFL.  Patient unfortunately later that day lost AFL placement, and was subsequently transition to boarder status while awaiting new placement disposition.  TOC then consulted.  To date, psychiatry has now been reconsulted 07/27/23 for increased behavioral disturbances, continued attempts to swallow foreign objects, and now refusal to take psychiatric medications scheduled. CCA performed by psychiatry team, and upon evaluation, patient continues to endorse no SI/HI/AVH.   Discussed with Dr. Maple Hudson from primary EDP team that the recommendations remain the same, strict adherence to safety measures need to be implemented listed below.  Patient continues to not meet inpatient criteria at this time.  There are no medication changes that are felt the patient would benefit from at this time.  Spoke with Dr. Enedina Finner who agrees with repsychiatric clearance, as well as the additional recommendations listed below.  08/26/2023- Psychiatry re-consultation for behavioral incident over the night  Per Dr. Theresia Lo, DO  I was notified by RN that patient threatening to leave and "jump in front of traffic".  I evaluated the patient at bedside and calmly was able to walk her back to her room.  She states that she is frustrated that she has been here for so long and just wants to leave but states if she does leave "she will jump  in front of traffic.  She denies SI but is unable to tell me why she would jump in  front of traffic.  She denies any hallucinations.  Patient is also complaining of sore throat with cough and congestion and some stomach upset.  She states this has been going on for the last 3 to 4 days.  She did have a COVID swab yesterday that was negative and has viscous lidocaine ordered as well as cough medication and GI cocktail.  Patient was recommended continued symptomatic management.  The patient was initially agreeable to stay and then again started threatening to leave and IVC was placed.  Psychiatry will be consulted   Patient seen today at the [facility] emergency department for face-to-face psychiatric re-evaluation.  Upon evaluation, like previously endorsed yesterday with Dr. Theresia Lo, patient expresses frustrations about being held in the emergency department for so long, but today does not endorse any suicidal and/or homicidal ideations. Patient additionally upon chart review has also since calmed down, no behavioral incidents reported from over the night, outside of the events that transpired which led to reconsultation.  Discussed with Dr. Jearld Fenton from primary EDP team that as a part of the patient's chronic illness course of intellectual disability, in combination with the patient's psychiatric condition of borderline personality disorder, behavioral incidents are expected to at times become appreciable, but that this does not necessarily warrant additional measures to be taken, but rather a continuation of current strict adherence to safety measures and current medication regimen already in place. Additionally discussed that patient continues to not meet inpatient criteria, as well as after review of the patient's medication regimen, and the events that transpired, medication changes continue to not feel to be of benefit at this time.  Spoke with Dr. Enedina Finner who agrees with repsychiatric clearance, as well as the additional recommendations listed below.  Recommendations- Cleared  psychiatrically for boarding status  -Recommend finger foods only diet -Recommend strict one-to-one safety sitter, to be at arms length at all times -Recommend mouth checks for medication administration -Recommend safe environment with strict precautions -Recommend inventory of supplies utilized during patient encounters -Recommend continue current medication regimen  Follow up consult on 10/03/2023  Patient is a 43 year old African-American female with a past psychiatric history significant for borderline personality disorder, intellectual disability, PICA, and adjustment disorder, who currently resides in the [facility] emergency department as a border while awaiting group home placement, due to losing previous living arrangements in an AFL from unsafe and unmanageable behaviors, resulting in the need for a higher level of care. Patient was recently admitted to the ER for a hypoxic event. Psychiatry consult was placed for "Patient continues to have sitter. Primary team has questions regarding medication regimen (if she fully optimized? ) and need for one-to-one sitter."  Patient seen face to face in the Emergency room with 1:1 sitter at the bedside. She is awake and alert, but appears to be in respiratory distress. Patient reports ongoing mood swings, racing thoughts, impulsivity and distractibility. However, she denies depressive symptoms, delusions, hallucinations, and other perceptual disturbances. Patient also denies suicidal or homicidal ideations, intent or plan. Patient reports she has been compliant with her medications but unsure if the current medication regimen is enough to control her mood and impulsivity. Of note, patient states she has been gaining a lot of weight likely due to her current medications.    Plan and recommendation: Continue Seroquel 200 mg at bedtime for mood/agitation/sleep-helps to improve impulsivity and mood stabilization in  patients with BPD.  Increase Zoloft to  100 mg daily-SSRI helps to improve impulsivity. Consider Lamotrigine 25 mg daily for 2 weeks, then 50 mg daily thereafter and up titrate as needed. For impulsivity and mood stabilization. It has less risk of weight gain compare to Depakote.  Discontinue Depakote-due to ongoing weight gain.  Continue 1:1 sitter for safety-Due to underlying ID and ongoing impulsivity.  Psychiatric consult service will sign off for now. Please re-consult as needed  Thank you for this consult.    Dairl Ponder, MD Attending psychiatrist.

## 2023-10-03 NOTE — Progress Notes (Addendum)
 Daily Progress Note Intern Pager: [phone]  Patient name: Megan Manning Medical record number: [id number] Date of birth: 01/11/1981 Age: 43 y.o. Gender: female  Primary Care Provider: Angelica Pou, NP Consultants: None Code Status: FULL  Pt Overview and Major Events to Date:  12/19-3/7: Boarding ED pending placement 3/8: Admitted  Assessment and Plan:  43 year old female who was boarding in the ED since 12/19 for placement, developed hypoxia on 3/8 and admitted for further workup.  Reassuringly troponins trended flat, and normal echocardiogram making cardiac etiology unlikely.  Overall today, patient is anxious appearing and uncomfortable on BiPAP.  Patient appeared to be trying to resist breaths from BiPAP and I question if she be more comfortable sitting up on low-flow.  Her 20 pathogen panel was negative.  No evidence of pneumonia on imaging. Unfortunately CT PE was unable to completely exclude pulmonary embolism, however when patient is distracted in the room by provider her heart rate and work of breathing become normal making my clinical suspicion for PE low.  Her lung sounds are clear today after receiving DuoNebs and treatment for asthma-suspect hypoxia and SOB will continue to improve with treatment of asthma. Assessment & Plan Hypoxia Hypoxia resolved with oxygen supplementation and asthma treatment.  CTAB this a.m. - Recommend continue telemetry given acuity of hypoxia and intermittent tachycardia - Recommend discontinuation of BiPAP during daytime, CPAP or BiPAP only with sleep - Regular diet  - AM CBC/BMP  - Fall precautions - Delirium precautions  - DuoNebs Q4H scheduled  - Consider steroids, risk v benefit with known psychiatric diagnosis - Wean O2 as tolerated  Hypertension Overall, blood pressure trend is within normal limits.  Intermittent elevations lasting less than an hour, very suspicious for anxiety. - Monitor blood pressure, if it continues to be  elevated above 140/90 consider adding losartan.  Self-injurious behavior Originally boarded in the ED for self-injurious behaviors.  Allegedly patient attempted to swallow a plastic bottle and pieces of cardboard.  Psychiatry was consulted in February and patient was psychiatrically cleared and now awaiting placement.  Most recently IVC'd December 2024 at [facility].   - Request re-eval from Psychiatry, details in comments of order. - Continue Zoloft 50 mg, Seroquel 200 mg daily at bedtime, doxazosin 2 mg daily at bedtime, Depakote 500 mg daily. - TOC for placement - Cont sitter for safety Chronic health problem T2DM: Last A1c 5.65 months ago.  No medications. OSA: Needs CPAP at night HLD: Continue Lipitor daily GERD: Continue Protonix 40 mg daily Hypothyroidism: Continue Synthroid  FEN/GI: Finger foods PPx: Lovenox Dispo: Pending placement, TOC following.  Subjective:  Patient expressed anxiety and difficulty breathing with the BiPAP.  Patient was squirming in bed stated she was uncomfortable but denied chest pain or shortness of breath.  She reports that she felt she was breathing better, although during observation patient appeared to be resisting breathing with BiPAP.  Once patient was distracted and reassured, breathing returned to normal and heart rate decreased to WNL.  Objective: Temp:  [98.4 F (36.9 C)-99.6 F (37.6 C)] 99.2 F (37.3 C) (03/08 1900) Pulse Rate:  [45-123] 99 (03/09 0600) Resp:  [18-34] 24 (03/09 0600) BP: (122-180)/(59-157) 122/106 (03/09 0600) SpO2:  [86 %-97 %] 93 % (03/09 0600) FiO2 (%):  [50 %] 50 % (03/09 0354) Physical Exam: General: NAD, lying in bed, anxious appearing Cardiovascular: RRR, no murmurs Respiratory: CTAB, normal work of breathing on BiPAP when calmed.  SpO2 95% in room. Abdomen: Soft, nontender, distended.  Extremities: Moving all 4 extremities, no edema  Laboratory: Most recent CBC Lab Results  Component Value Date   WBC 6.0  10/02/2023   HGB 11.6 (L) 10/02/2023   HCT 34.0 (L) 10/02/2023   MCV 89.4 10/02/2023   PLT 221 10/02/2023   Most recent BMP    Latest Ref Rng & Units 10/02/2023    2:14 PM  BMP  Sodium 135 - 145 mmol/L 143   Potassium 3.5 - 5.1 mmol/L 4.3    Tiffany Kocher, DO 10/03/2023, 7:24 AM  PGY-2, [facility] FPTS Intern pager: [phone], text pages welcome Secure chat group [facility] Teaching Service

## 2023-10-03 NOTE — Plan of Care (Signed)

## 2023-10-03 NOTE — Assessment & Plan Note (Addendum)
 Originally boarded in the ED for self-injurious behaviors.  Allegedly patient attempted to swallow a plastic bottle and pieces of cardboard.  Psychiatry was consulted in February and patient was psychiatrically cleared and now awaiting placement.  Most recently IVC'd December 2024 at [facility].   - Request re-eval from Psychiatry, details in comments of order. - Continue Zoloft 50 mg, Seroquel 200 mg daily at bedtime, doxazosin 2 mg daily at bedtime, Depakote 500 mg daily. - TOC for placement - Cont sitter for safety

## 2023-10-03 NOTE — Assessment & Plan Note (Addendum)
 Overall, blood pressure trend is within normal limits.  Intermittent elevations lasting less than an hour, very suspicious for anxiety. - Monitor blood pressure, if it continues to be elevated above 140/90 consider adding losartan.

## 2023-10-04 DIAGNOSIS — R0602 Shortness of breath: Principal | ICD-10-CM

## 2023-10-04 DIAGNOSIS — Z7289 Other problems related to lifestyle: Secondary | ICD-10-CM | POA: Diagnosis not present

## 2023-10-04 MED ORDER — ENOXAPARIN SODIUM 80 MG/0.8ML IJ SOSY
80.0000 mg | PREFILLED_SYRINGE | INTRAMUSCULAR | Status: DC
  Administered 2023-10-04 – 2023-10-12 (×9): 80 mg via SUBCUTANEOUS
  Filled 2023-10-04 (×9): qty 0.8

## 2023-10-04 MED ORDER — PNEUMOCOCCAL 20-VAL CONJ VACC 0.5 ML IM SUSY
0.5000 mL | PREFILLED_SYRINGE | INTRAMUSCULAR | Status: AC
  Administered 2023-10-06: 0.5 mL via INTRAMUSCULAR
  Filled 2023-10-04: qty 0.5

## 2023-10-04 MED ORDER — INFLUENZA VIRUS VACC SPLIT PF (FLUZONE) 0.5 ML IM SUSY
0.5000 mL | PREFILLED_SYRINGE | INTRAMUSCULAR | Status: AC
  Administered 2023-10-06: 0.5 mL via INTRAMUSCULAR
  Filled 2023-10-04: qty 0.5

## 2023-10-04 NOTE — Assessment & Plan Note (Addendum)
 Hypoxia resolved with oxygen supplementation and asthma treatment.  CTAB this a.m., no hypoxia. - Recommend continue telemetry given acuity of hypoxia and intermittent tachycardia - Recommend discontinuation of BiPAP during daytime, CPAP or BiPAP only with sleep - Regular diet  - AM CBC/BMP  - Fall precautions - Delirium precautions  - Wean O2 as tolerated
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
## 2023-10-04 NOTE — Assessment & Plan Note (Addendum)
 Originally boarded in the ED for self-injurious behaviors.  Allegedly patient attempted to swallow a plastic bottle and pieces of cardboard.  Psychiatry was consulted in February and patient was psychiatrically cleared and now awaiting placement.  Most recently IVC'd December 2024 at [facility]  Psychiatry reconsulted, made the following medication adjustments and signed off -Continue Zoloft 100 mg daily, Seroquel 200 mg daily at bedtime, lamotrigine 25 mg x 2 weeks then 50 mg daily, doxazosin 2 mg daily at bedtime - TOC for placement - Cont sitter for safety

## 2023-10-04 NOTE — Assessment & Plan Note (Addendum)
 T2DM: Last A1c 5.65 months ago.  Controlled OSA: Needs CPAP at night GERD: Continue Protonix 40 mg daily Hypothyroidism: Continue Synthroid 88 mcg

## 2023-10-04 NOTE — Progress Notes (Signed)
 Daily Progress Note Intern Pager: [phone]  Patient name: Megan Manning Medical record number: [id number] Date of birth: 19-Aug-1980 Age: 43 y.o. Gender: female  Primary Care Provider: Angelica Pou, NP Consultants: None Code Status: Full  Pt Overview and Major Events to Date:  12/19-3/7: boarding in ED pending placement 3/8: Admitted to FMTS   Assessment and Plan:  43 year old female with past medical history T2DM, OSA, GERD, hypothyroidism, hypertension presenting to our service with hypoxia.  Her shortness of breath workup is overall unremarkable-suspect this is due to her severely elevated right hemidiaphragm.  Low suspicion of acute pathology causing her hypoxia and shortness of breath, and her hypoxia has resolved.  She would be medically cleared for placement at alternative family living facility when available. Assessment & Plan Hypoxia Hypoxia resolved with oxygen supplementation and asthma treatment.  CTAB this a.m., no hypoxia. - Recommend continue telemetry given acuity of hypoxia and intermittent tachycardia - Recommend discontinuation of BiPAP during daytime, CPAP or BiPAP only with sleep - Regular diet  - AM CBC/BMP  - Fall precautions - Delirium precautions  - Wean O2 as tolerated  Hypertension Overall, blood pressure trend is within normal limits.  Suspect occasional elevations are in the setting of anxiety -Continue to monitor blood pressure Self-injurious behavior Originally boarded in the ED for self-injurious behaviors.  Allegedly patient attempted to swallow a plastic bottle and pieces of cardboard.  Psychiatry was consulted in February and patient was psychiatrically cleared and now awaiting placement.  Most recently IVC'd December 2024 at [facility].  Psychiatry reconsulted, made the following medication adjustments and signed off -Continue Zoloft 100 mg daily, Seroquel 200 mg daily at bedtime, lamotrigine 25 mg x 2 weeks then 50 mg daily, doxazosin 2 mg  daily at bedtime - TOC for placement - Cont sitter for safety Chronic health problem T2DM: Last A1c 5.65 months ago.  Controlled OSA: Needs CPAP at night GERD: Continue Protonix 40 mg daily Hypothyroidism: Continue Synthroid 88 mcg   FEN/GI: Finger foods diet PPx: Lovenox Dispo: Alternative living facility   pending placement  Subjective:  No acute events overnight.  States her breathing is about the same  Objective: Temp:  [98.4 F (36.9 C)-99.5 F (37.5 C)] 98.5 F (36.9 C) (03/10 0646) Pulse Rate:  [77-111] 77 (03/10 0341) Resp:  [16-33] 16 (03/10 0341) BP: (104-174)/(50-96) 126/62 (03/09 2140) SpO2:  [90 %-100 %] 93 % (03/10 0341) FiO2 (%):  [36 %-50 %] 36 % (03/09 2241) Physical Exam: General: No acute distress, moving all extremities sporadically Cardiovascular: Regular rate and rhythm, no murmurs Respiratory: CTAB, normal WOB on 4LNC Abdomen: Soft, nontender Extremities: No leg swelling bilateral lower extremities  Laboratory: Most recent CBC Lab Results  Component Value Date   WBC 7.0 10/03/2023   HGB 11.6 (L) 10/03/2023   HCT 37.9 10/03/2023   MCV 86.7 10/03/2023   PLT 234 10/03/2023   Most recent BMP    Latest Ref Rng & Units 10/03/2023    3:45 PM  BMP  Glucose 70 - 99 mg/dL 147   BUN 6 - 20 mg/dL 8   Creatinine 8.29 - 5.62 mg/dL 1.30   Sodium 865 - 784 mmol/L 139   Potassium 3.5 - 5.1 mmol/L 4.5   Chloride 98 - 111 mmol/L 105   CO2 22 - 32 mmol/L 25   Calcium 8.9 - 10.3 mg/dL 9.4    TSH normal Free T4 normal Respiratory pathogen panel negative Troponin normal D-dimer normal  Imaging/Diagnostic Tests:  CXR 09/27/23 IMPRESSION: Elevated right hemidiaphragm with increasing right basilar atelectasis.  CTA PE 10/02/23 IMPRESSION: Due to inadequate opacification of pulmonary arteries, despite multiple attempts, as well as artifact secondary to body habitus, pulmonary emboli cannot be excluded on the basis of this exam.   Stable elevated right  hemidiaphragm is noted with mild right basilar atelectasis.  Nicki Furlan, DO 10/04/2023, 7:05 AM  PGY-1, [facility] FPTS Intern pager: [phone], text pages welcome Secure chat group [facility] Teaching Service

## 2023-10-04 NOTE — Assessment & Plan Note (Addendum)
 Overall, blood pressure trend is within normal limits.  Suspect occasional elevations are in the setting of anxiety -Continue to monitor blood pressure

## 2023-10-05 ENCOUNTER — Encounter (HOSPITAL_COMMUNITY): Payer: Self-pay | Admitting: Student

## 2023-10-05 DIAGNOSIS — R0902 Hypoxemia: Secondary | ICD-10-CM

## 2023-10-05 DIAGNOSIS — N898 Other specified noninflammatory disorders of vagina: Secondary | ICD-10-CM

## 2023-10-05 DIAGNOSIS — Z7289 Other problems related to lifestyle: Secondary | ICD-10-CM | POA: Diagnosis not present

## 2023-10-05 LAB — URINALYSIS, ROUTINE W REFLEX MICROSCOPIC
Bilirubin Urine: NEGATIVE
Glucose, UA: NEGATIVE mg/dL
Hgb urine dipstick: NEGATIVE
Ketones, ur: NEGATIVE mg/dL
Leukocytes,Ua: NEGATIVE
Nitrite: NEGATIVE
Protein, ur: NEGATIVE mg/dL
Specific Gravity, Urine: 1.028 (ref 1.005–1.030)
pH: 5 (ref 5.0–8.0)

## 2023-10-05 MED ORDER — FLUCONAZOLE 100 MG PO TABS
100.0000 mg | ORAL_TABLET | ORAL | Status: AC
  Administered 2023-10-05 – 2023-10-09 (×3): 100 mg via ORAL
  Filled 2023-10-05 (×3): qty 1

## 2023-10-05 NOTE — Assessment & Plan Note (Addendum)
 Originally boarded in the ED for self-injurious behaviors.  Allegedly patient attempted to swallow a plastic bottle and pieces of cardboard.  Psychiatry was consulted in February and patient was psychiatrically cleared and now awaiting placement.  Most recently IVC'd December 2024 at [facility]  Psychiatry reconsulted, made the following medication adjustments and signed off -Continue Zoloft 100 mg daily, Seroquel 200 mg daily at bedtime, lamotrigine 25 mg x 2 weeks then 50 mg daily, doxazosin 2 mg daily at bedtime - TOC for placement - Cont sitter for safety

## 2023-10-05 NOTE — Assessment & Plan Note (Addendum)
 T2DM: Last A1c 5.65 months ago.  Controlled OSA: Continue nighttime CPAP GERD: Continue Protonix 40 mg BID Hypothyroidism: Continue Synthroid 88 mcg daily

## 2023-10-05 NOTE — Assessment & Plan Note (Deleted)
 Hypoxia resolved with oxygen supplementation and asthma treatment.  CTAB this a.m., no hypoxia. - Recommend continue telemetry given acuity of hypoxia and intermittent tachycardia - Recommend discontinuation of BiPAP during daytime, CPAP or BiPAP only with sleep - Regular diet  - AM CBC/BMP  - Fall precautions - Delirium precautions  - Wean O2 as tolerated

## 2023-10-05 NOTE — Assessment & Plan Note (Addendum)
 Overall, blood pressure trend is within normal limits.  Suspect occasional elevations are in the setting of anxiety -Continue to monitor blood pressure

## 2023-10-05 NOTE — Assessment & Plan Note (Signed)
 Hypoxia resolved with oxygen supplementation and asthma treatment.  CTAB this a.m., no hypoxia. I suspect her shortness of breath is in the setting of her chronic elevated right hemidiaphragm.  - CPAP at night - Regular diet  - Fall precautions - Delirium precautions  - Wean O2 as tolerated

## 2023-10-05 NOTE — TOC Progression Note (Signed)
 Transition of Care [facility]) - Progression Note    Patient Details  Name: Megan Manning MRN: [id number] Date of Birth: 01/16/1981  Transition of Care [facility]) CM/SW Contact  Inis Sizer, LCSW Phone Number: 10/05/2023, 11:17 AM  Clinical Narrative:    CSW received text from Delilah at [facility] stating patient's insurance is not in network with [employer] and a single case agreement is being worked on from [place].  CSW received chat from MD regarding patient's oxygen needs.  CSW spoke with Lillia Abed at [facility] who states the provider can accept patient with oxygen needs but states if she remains on 4L, she will need a portable tank and oxygen concentrator. CSW informed MD of information.   Expected Discharge Plan: Group Home Barriers to Discharge: Insurance Authorization  Expected Discharge Plan and Services                                               Social Determinants of Health (SDOH) Interventions SDOH Screenings   Food Insecurity: No Food Insecurity (10/04/2023)  Housing: Low Risk  (10/04/2023)  Transportation Needs: No Transportation Needs (10/04/2023)  Utilities: Not At Risk (10/04/2023)  Financial Resource Strain: Low Risk  (09/02/2022)   Received from [facility]  Social Connections: Unknown (12/09/2021)   Received from [facility]  Stress: No Stress Concern Present (04/15/2023)   Received from [facility]  Recent Concern: Stress - Stress Concern Present (04/05/2023)   Received from [facility]  Tobacco Use: Low Risk  (07/19/2023)    Readmission Risk Interventions     No data to display

## 2023-10-05 NOTE — Plan of Care (Signed)
   Problem: Education: Goal: Knowledge of General Education information will improve Description Including pain rating scale, medication(s)/side effects and non-pharmacologic comfort measures Outcome: Progressing   Problem: Health Behavior/Discharge Planning: Goal: Ability to manage health-related needs will improve Outcome: Progressing

## 2023-10-05 NOTE — Assessment & Plan Note (Signed)
 Itching, burning with urination starting yesterday. Hx yeast infections, pt states this is similar. U/A unremarkable. Will be very difficult to complete speculum exam while inpatient - empirically treat with Diflucan 100mg  every other day

## 2023-10-05 NOTE — Progress Notes (Signed)
 Daily Progress Note Intern Pager: [phone]  Patient name: Megan Manning Medical record number: [id number] Date of birth: 1981/06/14 Age: 43 y.o. Gender: female  Primary Care Provider: Angelica Pou, NP Consultants: Psychiatry (signed off) Code Status: Full  Pt Overview and Major Events to Date:  12/19-3/7: boarding in ED pending placement 3/8: Admitted to FMTS  Assessment and Plan:  42yo F with PMHx T2DM, OSA, GERD, hypothyroidism, HTN who was initially admitted with hypoxia which has resolved, now medically stable for placement  Assessment & Plan Shortness of breath Hypoxia resolved with oxygen supplementation and asthma treatment.  CTAB this a.m., no hypoxia. I suspect her shortness of breath is in the setting of her chronic elevated right hemidiaphragm.  - CPAP at night - Regular diet  - Fall precautions - Delirium precautions  - Wean O2 as tolerated  Hypertension Overall, blood pressure trend is within normal limits.  Suspect occasional elevations are in the setting of anxiety -Continue to monitor blood pressure Self-injurious behavior Originally boarded in the ED for self-injurious behaviors.  Allegedly patient attempted to swallow a plastic bottle and pieces of cardboard.  Psychiatry was consulted in February and patient was psychiatrically cleared and now awaiting placement.  Most recently IVC'd December 2024 at [facility].  Psychiatry reconsulted, made the following medication adjustments and signed off -Continue Zoloft 100 mg daily, Seroquel 200 mg daily at bedtime, lamotrigine 25 mg x 2 weeks then 50 mg daily, doxazosin 2 mg daily at bedtime - TOC for placement - Cont sitter for safety Vaginal itching Itching, burning with urination starting yesterday. Hx yeast infections, pt states this is similar. U/A unremarkable. Will be very difficult to complete speculum exam while inpatient - empirically treat with Diflucan 100mg  every other day Chronic health problem T2DM:  Last A1c 5.65 months ago.  Controlled OSA: Continue nighttime CPAP GERD: Continue Protonix 40 mg BID Hypothyroidism: Continue Synthroid 88 mcg daily   FEN/GI: Finger foods diet PPx: Lovenox Dispo: Alternative family living facility   pending placement  Subjective:  NAEON. No concerns this morning other than vaginal itching,burning with urination that began yesterday  Objective: Temp:  [98 F (36.7 C)-99.1 F (37.3 C)] 98.2 F (36.8 C) (03/11 1212) Pulse Rate:  [92-106] 106 (03/11 1212) Resp:  [19-23] 19 (03/11 1212) BP: (106-146)/(73-97) 106/77 (03/11 1212) SpO2:  [93 %-97 %] 93 % (03/11 1212) Physical Exam: General: Well appearing, NAD Respiratory: breathing comfortably on 4L , speaking in complete sentences  Pelvic: NT Chaperone present. Just from exterior exam, no rash, erythema, or sign of infection to outer labia although significantly sweaty  Laboratory: Most recent CBC Lab Results  Component Value Date   WBC 7.0 10/03/2023   HGB 11.6 (L) 10/03/2023   HCT 37.9 10/03/2023   MCV 86.7 10/03/2023   PLT 234 10/03/2023   Most recent BMP    Latest Ref Rng & Units 10/03/2023    3:45 PM  BMP  Glucose 70 - 99 mg/dL 875   BUN 6 - 20 mg/dL 8   Creatinine 6.43 - 3.29 mg/dL 5.18   Sodium 841 - 660 mmol/L 139   Potassium 3.5 - 5.1 mmol/L 4.5   Chloride 98 - 111 mmol/L 105   CO2 22 - 32 mmol/L 25   Calcium 8.9 - 10.3 mg/dL 9.4    U/A normal  Leilan Bochenek, DO 10/05/2023, 12:55 PM  PGY-1, [place] [facility] FPTS Intern pager: [phone], text pages welcome Secure chat group [facility]  Teaching Service

## 2023-10-06 DIAGNOSIS — R0602 Shortness of breath: Secondary | ICD-10-CM | POA: Diagnosis not present

## 2023-10-06 DIAGNOSIS — Z7289 Other problems related to lifestyle: Secondary | ICD-10-CM | POA: Diagnosis not present

## 2023-10-06 DIAGNOSIS — J9601 Acute respiratory failure with hypoxia: Secondary | ICD-10-CM | POA: Diagnosis not present

## 2023-10-06 DIAGNOSIS — Z1152 Encounter for screening for COVID-19: Secondary | ICD-10-CM | POA: Diagnosis not present

## 2023-10-06 DIAGNOSIS — E662 Morbid (severe) obesity with alveolar hypoventilation: Secondary | ICD-10-CM | POA: Diagnosis not present

## 2023-10-06 DIAGNOSIS — Z23 Encounter for immunization: Secondary | ICD-10-CM | POA: Diagnosis not present

## 2023-10-06 NOTE — Progress Notes (Signed)
 Mobility Specialist Progress Note;   10/06/23 0933  Mobility  Activity Ambulated with assistance in room  Level of Assistance Contact guard assist, steadying assist  Assistive Device Front wheel walker  Distance Ambulated (ft) 5 ft  Activity Response Tolerated fair  Mobility Referral Yes  Mobility visit 1 Mobility  Mobility Specialist Start Time (ACUTE ONLY) 0933  Mobility Specialist Stop Time (ACUTE ONLY) 0950  Mobility Specialist Time Calculation (min) (ACUTE ONLY) 17 min   Pt eager for mobility. Required MinG assistance during ambulation for safety. Ambulated in room on 1LO2, VSS. Pt was able to stand from chair and take a few steps forward/backward before becoming fatigued. HR increased to 132bpm w/ activity. Pt returned back to chair with all needs met, VSS. Sitter in room.   Caesar Bookman Mobility Specialist Please contact via SecureChat or [employer] [phone]

## 2023-10-06 NOTE — Assessment & Plan Note (Signed)
 T2DM: Last A1c 5.65 months ago.  Controlled OSA: Continue nighttime CPAP GERD: Continue Protonix 40 mg BID Hypothyroidism: Continue Synthroid 88 mcg daily

## 2023-10-06 NOTE — Progress Notes (Signed)
 Patient lost IV access. Per MD, we can leave patient without IV access for now.

## 2023-10-06 NOTE — Progress Notes (Signed)
 Daily Progress Note Intern Pager: [phone]  Patient name: Megan Manning Medical record number: [id number] Date of birth: 1981-01-17 Age: 43 y.o. Gender: female  Primary Care Provider: Angelica Pou, NP Consultants: None Code Status: Full  Pt Overview and Major Events to Date:  12/19-3/7: boarding in ED pending placement 3/8: Admitted to FMTS  Assessment and Plan:  43 year old female with past medical history type 2 diabetes, OSA, GERD, hypothyroidism, hypertension initially admitted with hypoxia that resolved.  Patient is medically stable for placement. Assessment & Plan Self-injurious behavior Originally boarded in the ED for self-injurious behaviors.  Allegedly patient attempted to swallow a plastic bottle and pieces of cardboard.  Psychiatry was consulted in February and patient was psychiatrically cleared and now awaiting placement.  Most recently IVC'd December 2024 at [facility].  Psychiatry reconsulted, made the following medication adjustments and signed off -Continue Zoloft 100 mg daily, Seroquel 200 mg daily at bedtime, lamotrigine 25 mg x 2 weeks then 50 mg daily, doxazosin 2 mg daily at bedtime - TOC for placement - Cont sitter for safety Shortness of breath Hypoxia resolved with oxygen supplementation and asthma treatment.  CTAB this a.m., no hypoxia. I suspect her shortness of breath is in the setting of her chronic elevated right hemidiaphragm.  Seems to have improved.  She was saturating 93% on room air this morning. - CPAP at night - Regular diet  - Fall precautions - Delirium precautions  Hypertension Overall, blood pressure trend is within normal limits.  Suspect occasional elevations are in the setting of anxiety -Continue to monitor blood pressure Vaginal itching Itching, burning with urination starting yesterday. Hx yeast infections, pt states this is similar. U/A unremarkable. Will be very difficult to complete speculum exam while inpatient - empirically  treat with Diflucan 100mg  every other day x3 doses Chronic health problem T2DM: Last A1c 5.65 months ago.  Controlled OSA: Continue nighttime CPAP GERD: Continue Protonix 40 mg BID Hypothyroidism: Continue Synthroid 88 mcg daily   FEN/GI: Finger foods PPx: Lovenox Dispo: Alternative family living facility  . Barriers include bed availability.   Subjective:  NAEON. No concerns this morning. Reports episode of chest pain last night that resolved, has had similar pain before. Echo this admission normal and EKG normal last night.   Objective: Temp:  [98.2 F (36.8 C)-98.9 F (37.2 C)] 98.3 F (36.8 C) (03/12 0744) Pulse Rate:  [90-110] 103 (03/12 0744) Resp:  [19-24] 20 (03/12 0744) BP: (106-138)/(55-84) 138/84 (03/12 0744) SpO2:  [91 %-94 %] 91 % (03/12 0515) FiO2 (%):  [24 %] 24 % (03/11 2220) Physical Exam: General: Well appearing, no acute distress Cardiovascular: RRR, no m/r/g Respiratory: CTAB, breathing comfortably on no oxygen Abdomen: Soft, non-tender Extremities: Chronic edema BLE  Laboratory: Most recent CBC Lab Results  Component Value Date   WBC 7.0 10/03/2023   HGB 11.6 (L) 10/03/2023   HCT 37.9 10/03/2023   MCV 86.7 10/03/2023   PLT 234 10/03/2023   Most recent BMP    Latest Ref Rng & Units 10/03/2023    3:45 PM  BMP  Glucose 70 - 99 mg/dL 147   BUN 6 - 20 mg/dL 8   Creatinine 8.29 - 5.62 mg/dL 1.30   Sodium 865 - 784 mmol/L 139   Potassium 3.5 - 5.1 mmol/L 4.5   Chloride 98 - 111 mmol/L 105   CO2 22 - 32 mmol/L 25   Calcium 8.9 - 10.3 mg/dL 9.4    Shaundrea Carrigg, DO 10/06/2023, 8:39  AM  PGY-1, [facility] FPTS Intern pager: [phone], text pages welcome Secure chat group [facility] Teaching Service

## 2023-10-06 NOTE — Assessment & Plan Note (Addendum)
 Itching, burning with urination starting yesterday. Hx yeast infections, pt states this is similar. U/A unremarkable. Will be very difficult to complete speculum exam while inpatient - empirically treat with Diflucan 100mg  every other day x3 doses

## 2023-10-06 NOTE — Assessment & Plan Note (Signed)
 Overall, blood pressure trend is within normal limits.  Suspect occasional elevations are in the setting of anxiety -Continue to monitor blood pressure

## 2023-10-06 NOTE — Assessment & Plan Note (Signed)
 Originally boarded in the ED for self-injurious behaviors.  Allegedly patient attempted to swallow a plastic bottle and pieces of cardboard.  Psychiatry was consulted in February and patient was psychiatrically cleared and now awaiting placement.  Most recently IVC'd December 2024 at [facility]  Psychiatry reconsulted, made the following medication adjustments and signed off -Continue Zoloft 100 mg daily, Seroquel 200 mg daily at bedtime, lamotrigine 25 mg x 2 weeks then 50 mg daily, doxazosin 2 mg daily at bedtime - TOC for placement - Cont sitter for safety

## 2023-10-06 NOTE — TOC Progression Note (Signed)
 Transition of Care [facility]) - Progression Note    Patient Details  Name: Megan Manning MRN: [id number] Date of Birth: 05-27-1981  Transition of Care [facility]) CM/SW Contact  Erin Sons, [place] Phone Number: 10/06/2023, 2:13 PM  Clinical Narrative:     Awaiting confirmation of single case agreement between [facility] and [place]. TOC will continue to follow.    Expected Discharge Plan: Group Home Barriers to Discharge: Insurance Authorization  Expected Discharge Plan and Services                                               Social Determinants of Health (SDOH) Interventions SDOH Screenings   Food Insecurity: No Food Insecurity (10/04/2023)  Housing: Low Risk  (10/04/2023)  Transportation Needs: No Transportation Needs (10/04/2023)  Utilities: Not At Risk (10/04/2023)  Financial Resource Strain: Low Risk  (09/02/2022)   Received from [facility]  Social Connections: Unknown (12/09/2021)   Received from [facility]  Stress: No Stress Concern Present (04/15/2023)   Received from [facility]  Recent Concern: Stress - Stress Concern Present (04/05/2023)   Received from [facility]  Tobacco Use: Low Risk  (07/19/2023)    Readmission Risk Interventions     No data to display

## 2023-10-06 NOTE — Assessment & Plan Note (Addendum)
 Hypoxia resolved with oxygen supplementation and asthma treatment.  CTAB this a.m., no hypoxia. I suspect her shortness of breath is in the setting of her chronic elevated right hemidiaphragm.  Seems to have improved.  She was saturating 93% on room air this morning. - CPAP at night - Regular diet  - Fall precautions - Delirium precautions

## 2023-10-06 NOTE — Plan of Care (Signed)
  Problem: Education: Goal: Knowledge of General Education information will improve Description: Including pain rating scale, medication(s)/side effects and non-pharmacologic comfort measures Outcome: Progressing   Problem: Clinical Measurements: Goal: Respiratory complications will improve Outcome: Progressing Goal: Cardiovascular complication will be avoided Outcome: Progressing   Problem: Pain Managment: Goal: General experience of comfort will improve and/or be controlled Outcome: Progressing   Problem: Safety: Goal: Ability to remain free from injury will improve Outcome: Progressing

## 2023-10-07 DIAGNOSIS — J9811 Atelectasis: Secondary | ICD-10-CM | POA: Diagnosis not present

## 2023-10-07 DIAGNOSIS — J9601 Acute respiratory failure with hypoxia: Secondary | ICD-10-CM | POA: Diagnosis not present

## 2023-10-07 DIAGNOSIS — J9612 Chronic respiratory failure with hypercapnia: Secondary | ICD-10-CM | POA: Diagnosis not present

## 2023-10-07 DIAGNOSIS — R0602 Shortness of breath: Secondary | ICD-10-CM | POA: Diagnosis not present

## 2023-10-07 MED ORDER — BUDESONIDE 0.5 MG/2ML IN SUSP
0.5000 mg | Freq: Two times a day (BID) | RESPIRATORY_TRACT | Status: DC
  Administered 2023-10-07 – 2023-10-08 (×2): 0.5 mg via RESPIRATORY_TRACT
  Filled 2023-10-07 (×2): qty 2

## 2023-10-07 MED ORDER — ARFORMOTEROL TARTRATE 15 MCG/2ML IN NEBU
15.0000 ug | INHALATION_SOLUTION | Freq: Two times a day (BID) | RESPIRATORY_TRACT | Status: DC
  Administered 2023-10-07 – 2023-10-08 (×2): 15 ug via RESPIRATORY_TRACT
  Filled 2023-10-07 (×2): qty 2

## 2023-10-07 MED ORDER — AZITHROMYCIN 250 MG PO TABS
250.0000 mg | ORAL_TABLET | Freq: Every day | ORAL | Status: AC
  Administered 2023-10-08 – 2023-10-11 (×4): 250 mg via ORAL
  Filled 2023-10-07 (×4): qty 1

## 2023-10-07 MED ORDER — LEVALBUTEROL HCL 0.63 MG/3ML IN NEBU
0.6300 mg | INHALATION_SOLUTION | Freq: Four times a day (QID) | RESPIRATORY_TRACT | Status: DC
  Administered 2023-10-07: 0.63 mg via RESPIRATORY_TRACT
  Filled 2023-10-07: qty 3

## 2023-10-07 MED ORDER — LEVALBUTEROL HCL 0.63 MG/3ML IN NEBU: 0.6300 mg | INHALATION_SOLUTION | Freq: Four times a day (QID) | RESPIRATORY_TRACT | Status: DC | PRN

## 2023-10-07 MED ORDER — AZITHROMYCIN 250 MG PO TABS
500.0000 mg | ORAL_TABLET | Freq: Every day | ORAL | Status: AC
  Administered 2023-10-07: 500 mg via ORAL
  Filled 2023-10-07: qty 2

## 2023-10-07 NOTE — Progress Notes (Addendum)
 Daily Progress Note Intern Pager: [phone]  Patient name: Megan Manning Medical record number: [id number] Date of birth: 1981/01/13 Age: 43 y.o. Gender: female  Primary Care Provider: Angelica Pou, NP Consultants: None Code Status: Full  Pt Overview and Major Events to Date:  12/19 through 3/7: Boarding in the ED pending placement 3/8: Admitted to FMTS  Assessment and Plan:  43 year old female with past medical history of type 2 diabetes, OSA, GERD, hypothyroidism, hypertension who was initially admitted with acute hypoxemic respiratory failure, now improved,  and shortness of breath.  Assessment & Plan Self-injurious behavior Boarding in the ED for approximately 75 days while finding placement for patient.  Presented initially with self-injurious behavior such as swallowing several nonfood items.  Psychiatry consulted and signed off multiple times.  Most recent recommendations from them are as follows - Zoloft 100 mg daily, Seroquel 200 mg daily at bedtime, lamotrigine 25 mg x 2 weeks then increase to 50 mg daily, doxazosin 2 mg daily at bedtime - TOC attempting to find placement for patient - Continue one-to-one sitter Shortness of breath Developed shortness of breath while boarding in the ED with episode of hypoxia.  Workup including D-dimer x2, troponins, respiratory pathogen panel all unremarkable.  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  Remains on oxygen supplementation, I suspect her subjective shortness of breath may be in the setting of her right hemidiaphragm along with anxiety as it seems to worsen when she gets anxious, however she does become truly hypoxic requiring oxygen supplementation.  - Wean oxygen as able, O2 goal >88% - Xopenex neb q6h - Pulmonology consulted, appreciate recs - CPAP at night - Regular diet - Fall, delirium precautions -  PT/OT eval, suspect deconditioning Hypertension Suspect intermittent elevations associated with tachycardia are in the setting of anxiety - Continue to monitor Vaginal itching (Resolved: 10/07/2023) Patient had itching and burning with urination.  Urinalysis negative for infection.  Empirically treated for yeast infection with Diflucan 100 mg every other day for 3 doses.  This has resolved.  Chronic and Stable Problems:  Type 2 diabetes-controlled, last A1c 5.65 OSA-continue CPAP nightly GERD-continue Protonix 40 mg twice daily Hypothyroidism-continue Synthroid 88 mcg daily   FEN/GI: Finger food diet PPx: Lovenox Dispo: Alternative family living facility barriers include bed availability, finding placement  Subjective:  NAEON.  Concerns this morning.  States that her shortness of breath is the same  Objective: Temp:  [97.9 F (36.6 C)-99 F (37.2 C)] 97.9 F (36.6 C) (03/13 0453) Pulse Rate:  [93-132] 107 (03/13 0453) Resp:  [18-22] 18 (03/13 0453) BP: (123-164)/(65-99) 123/96 (03/13 0453) SpO2:  [91 %-99 %] 94 % (03/13 0453) Physical Exam: General: No acute distress, resting comfortably Cardiovascular: No murmurs, regular rate and rhythm Respiratory: Breathing comfortably on 3 L nasal cannula saturating at 96%.  Speaking in complete sentences.  Clear to auscultation bilaterally. Abdomen: Normal bowel sounds, soft, nontender Extremities: Chronic bilateral lower extremity edema  Laboratory: Most recent CBC Lab Results  Component Value Date   WBC 7.0 10/03/2023   HGB 11.6 (L) 10/03/2023   HCT 37.9 10/03/2023   MCV 86.7 10/03/2023   PLT 234 10/03/2023   Most recent BMP    Latest Ref Rng & Units 10/03/2023    3:45 PM  BMP  Glucose 70 - 99 mg/dL 147   BUN 6 - 20 mg/dL 8   Creatinine 8.29 - 5.62  mg/dL 1.61   Sodium 096 - 045 mmol/L 139   Potassium 3.5 - 5.1 mmol/L 4.5   Chloride 98 - 111 mmol/L 105   CO2 22 - 32 mmol/L 25   Calcium 8.9 - 10.3 mg/dL 9.4    Mohab Ashby,  Earle Troiano, DO 10/07/2023, 7:00 AM  PGY-1, [place] [facility] FPTS Intern pager: [phone], text pages welcome Secure chat group [facility] Teaching Service

## 2023-10-07 NOTE — TOC Progression Note (Signed)
 Transition of Care [facility]) - Progression Note    Patient Details  Name: Megan Manning MRN: [id number] Date of Birth: 1980-08-20  Transition of Care [facility]) CM/SW Contact  Delilah Shan, LCSWA Phone Number: 10/07/2023, 11:29 AM  Clinical Narrative:     CSW awaiting confirmation of single case agreement between [facility] and [place]. TOC will continue to follow.   Expected Discharge Plan: Group Home Barriers to Discharge: Insurance Authorization  Expected Discharge Plan and Services                                               Social Determinants of Health (SDOH) Interventions SDOH Screenings   Food Insecurity: No Food Insecurity (10/04/2023)  Housing: Low Risk  (10/04/2023)  Transportation Needs: No Transportation Needs (10/04/2023)  Utilities: Not At Risk (10/04/2023)  Financial Resource Strain: Low Risk  (09/02/2022)   [facility]  Social Connections: Unknown (12/09/2021)   Received from [facility]  Stress: No Stress Concern Present (04/15/2023)   Received from [facility]  Recent Concern: Stress - Stress Concern Present (04/05/2023)   Received from [facility]  Tobacco Use: Low Risk  (07/19/2023)    Readmission Risk Interventions     No data to display

## 2023-10-07 NOTE — Assessment & Plan Note (Deleted)
 T2DM: Last A1c 5.65 months ago.  Controlled OSA: Continue nighttime CPAP GERD: Continue Protonix 40 mg BID Hypothyroidism: Continue Synthroid 88 mcg daily

## 2023-10-07 NOTE — Assessment & Plan Note (Addendum)
 Suspect intermittent elevations associated with tachycardia are in the setting of anxiety - Continue to monitor

## 2023-10-07 NOTE — Consult Note (Signed)
 NAME:  AKIVA JOSEY, MRN:  [id number], DOB:  30-Jun-1981, LOS: 5 ADMISSION DATE:  07/19/2023, CONSULTATION DATE:  10/07/23 REFERRING MD:  Dr. Manson Passey CHIEF COMPLAINT:  Asthma   History of Present Illness:  Mikeyla Music is a 43 year old woman with DMII, hypertension, obesity and borderline personality disorder who is admitted for shortness of breath.   PCCM consulted today due to on going oxygen requirements. She is on 2L currently. CTA PE study was negative for PE. She has elevated right hemidiaphragm and no other parenchymal issues noted.   She complains of dyspnea, productive cough and wheezing. She is a never smoker. She reports her brother has smoked in the house around her. She denies any fevers or chills.  Echo 3/8 shows normal EF 55%, RV size and systolic function are normal.  Extended RVP is negative. Flu/RSV/Covid is negative.  Pertinent  Medical History   Past Medical History:  Diagnosis Date   Adjustment disorder    Borderline personality disorder (HCC)    Diabetes mellitus without complication (HCC)    Hypertension    Hyperthyroidism    Hypoxia 10/02/2023   Intellectual disability    Lactic acidosis    Lymphedema    Obesity    Overdose    Pica in adults    Thyroid disease    Significant Hospital Events: Including procedures, antibiotic start and stop dates in addition to other pertinent events   3/13 PCCM consulted   Interim History / Subjective:  As above  Objective   Blood pressure (!) 123/96, pulse (!) 107, temperature 98.6 F (37 C), temperature source Oral, resp. rate 18, height 5\' 3"  (1.6 m), weight (!) 162.6 kg, SpO2 94%.        Intake/Output Summary (Last 24 hours) at 10/07/2023 1134 Last data filed at 10/07/2023 0516 Gross per 24 hour  Intake 1440 ml  Output 0 ml  Net 1440 ml   Filed Weights   07/19/23 0906 10/04/23 0705  Weight: (!) 154.7 kg (!) 162.6 kg    Examination: General: obese young woman, no acute distress HENT: Halfway/AT, moist  mucous membranes Lungs: diminished breath sounds throughout, no wheezing Cardiovascular: rrr, no murmurs Abdomen: soft, non-tender, non-distended Extremities: warm Neuro: alert, moving all extremities GU: n/a  Resolved Hospital Problem list     Assessment & Plan:  Acute Hypoxemic respiratory Failure Chronic Hypercapnic respiratory failure Obesity Hypoventilation Syndrome Morbid Obesity Elevated Right Hemidiaphragm Atelectasis Possible reactive airways disease/asthma Obstructive Sleep Apnea    Patient has morbid obesity which could be leading to restrictive defect along with elevated right hemidiaphragm. Her liver appears enlarged but is not reported as such on [name] and CT scans. She has wheezing and cough which could be related to reactive airways disease or asthma. She has cough with mucous production concerning for possible bronchitis. There is likely a component of deconditioning to her dyspnea.  Plan: - start budesonide and brovana nebs - Start Zpak for possible bronchitis - continue CPAP at night for OSA - Spo2 goal 88-92% - may need chronic O2 due to obesity hypoventilation syndrome - weight loss is recommended  PCCM will follow  Best Practice (right click and "Reselect all SmartList Selections" daily)   Per primary  Labs   CBC: Recent Labs  Lab 10/02/23 1130 10/02/23 1414 10/03/23 1545  WBC 6.0  --  7.0  NEUTROABS 3.1  --  5.3  HGB 11.5* 11.6* 11.6*  HCT 38.6 34.0* 37.9  MCV 89.4  --  86.7  PLT 221  --  234    Basic Metabolic Panel: Recent Labs  Lab 10/02/23 1130 10/02/23 1414 10/03/23 1545  NA 142 143 139  K 4.5 4.3 4.5  CL 107  --  105  CO2 27  --  25  GLUCOSE 124*  --  136*  BUN 9  --  8  CREATININE 0.80  --  0.76  CALCIUM 8.7*  --  9.4   GFR: Estimated Creatinine Clearance: 138.1 mL/min (by C-G formula based on SCr of 0.76 mg/dL). Recent Labs  Lab 10/02/23 1130 10/03/23 1545  WBC 6.0 7.0    Liver Function Tests: No results for  input(s): "AST", "ALT", "ALKPHOS", "BILITOT", "PROT", "ALBUMIN" in the last 168 hours. No results for input(s): "LIPASE", "AMYLASE" in the last 168 hours. No results for input(s): "AMMONIA" in the last 168 hours.  ABG    Component Value Date/Time   PHART 7.367 10/02/2023 1414   PCO2ART 49.8 (H) 10/02/2023 1414   PO2ART 69 (L) 10/02/2023 1414   HCO3 28.6 (H) 10/02/2023 1414   TCO2 30 10/02/2023 1414   ACIDBASEDEF 7.5 (H) 02/09/2017 1729   O2SAT 93 10/02/2023 1414     Coagulation Profile: No results for input(s): "INR", "PROTIME" in the last 168 hours.  Cardiac Enzymes: No results for input(s): "CKTOTAL", "CKMB", "CKMBINDEX", "TROPONINI" in the last 168 hours.  HbA1C: Hgb A1c MFr Bld  Date/Time Value Ref Range Status  10/03/2023 04:07 AM 6.0 (H) 4.8 - 5.6 % Final    Comment:    (NOTE) Pre diabetes:          5.7%-6.4%  Diabetes:              >6.4%  Glycemic control for   <7.0% adults with diabetes   02/10/2017 02:54 AM 5.4 4.8 - 5.6 % Final    Comment:    (NOTE)         Pre-diabetes: 5.7 - 6.4         Diabetes: >6.4         Glycemic control for adults with diabetes: <7.0     CBG: No results for input(s): "GLUCAP" in the last 168 hours.  Review of Systems:   Review of Systems  Constitutional:  Negative for chills, fever, malaise/fatigue and weight loss.  HENT:  Negative for congestion, sinus pain and sore throat.   Eyes: Negative.   Respiratory:  Positive for cough, sputum production and wheezing. Negative for hemoptysis and shortness of breath.   Cardiovascular:  Negative for chest pain, palpitations, orthopnea, claudication and leg swelling.  Gastrointestinal:  Negative for abdominal pain, heartburn, nausea and vomiting.  Genitourinary: Negative.   Musculoskeletal:  Negative for joint pain and myalgias.  Skin:  Negative for rash.  Neurological:  Negative for weakness.  Endo/Heme/Allergies: Negative.   Psychiatric/Behavioral: Negative.     Past Medical  History:  She,  has a past medical history of Adjustment disorder, Borderline personality disorder (HCC), Diabetes mellitus without complication (HCC), Hypertension, Hyperthyroidism, Hypoxia (10/02/2023), Intellectual disability, Lactic acidosis, Lymphedema, Obesity, Overdose, Pica in adults, and Thyroid disease.   Surgical History:   Past Surgical History:  Procedure Laterality Date   COLONOSCOPY WITH PROPOFOL N/A 12/22/2016   Procedure: COLONOSCOPY WITH PROPOFOL;  Surgeon: Wyline Mood, MD;  Location: [facility] ENDOSCOPY;  Service: Endoscopy;  Laterality: N/A;   NO PAST SURGERIES       Social History:   reports that she has never smoked. She has never used smokeless tobacco. She reports that she  does not drink alcohol and does not use drugs.   Family History:  Her family history includes Healthy in her father and mother. There is no history of CVA.   Allergies Allergies  Allergen Reactions   Chlorpromazine Rash and Other (See Comments)    Caused cornea problems  Not listed on the MAR   Lactose Intolerance (Gi)     Lactose intolerant     Home Medications  Prior to Admission medications   Medication Sig Start Date End Date Taking? Authorizing Provider  acetaminophen (TYLENOL) 500 MG tablet Take 1,000 mg by mouth every 8 (eight) hours as needed for mild pain (pain score 1-3) or fever.    [provider]  albuterol (VENTOLIN HFA) 108 (90 Base) MCG/ACT inhaler Inhale 2 puffs into the lungs every 6 (six) hours as needed for wheezing (or coughing). 07/13/19   [provider]  alum & mag hydroxide-simeth (MAALOX PLUS) 400-400-40 MG/5ML suspension Take 30 mLs by mouth every 8 (eight) hours as needed (heartburn and upset stomach).    [provider]  amoxicillin (AMOXIL) 500 MG tablet Take 500 mg by mouth 3 (three) times daily. 07/16/23   [provider]  Ca Carbonate-Mag Hydroxide (ANTACID ULTRA STRENGTH) 1000-200 MG CHEW Chew 0.5 tablets by mouth daily.     [provider]  celecoxib (CELEBREX) 200 MG capsule Take 200 mg by mouth 2 (two) times daily.    [provider]  cetirizine (ZYRTEC) 10 MG tablet Take 10 mg by mouth daily.    [provider]  cloNIDine (CATAPRES) 0.1 MG tablet Take 0.1 mg by mouth every 12 (twelve) hours as needed (for sBP >160 or dBP >90; hold for HR <55 bpm).    [provider]  cyclobenzaprine (FLEXERIL) 10 MG tablet Take 10 mg by mouth 3 (three) times daily as needed for muscle spasms.    [provider]  diazepam (VALIUM) 5 MG tablet Take 10 mg by mouth every 8 (eight) hours as needed for anxiety.    [provider]  diclofenac Sodium (VOLTAREN) 1 % GEL Apply 4 g topically 3 (three) times daily as needed (for arthritis pain).    [provider]  dicyclomine (BENTYL) 10 MG capsule Take 10 mg by mouth 3 (three) times daily as needed (abdominal pain).    [provider]  docusate sodium (COLACE) 100 MG capsule Take 200 mg by mouth daily.    [provider]  doxazosin (CARDURA) 2 MG tablet Take 2 mg by mouth daily.    [provider]  fluPHENAZine (PROLIXIN) 2.5 MG tablet Take 7.5 mg by mouth 2 (two) times daily.    [provider]  furosemide (LASIX) 40 MG tablet Take 0.5 tablets (20 mg total) by mouth daily. 06/29/22   Elgergawy, Leana Roe, MD  guaiFENesin (ROBITUSSIN) 100 MG/5ML liquid Take 5 mLs by mouth in the morning and at bedtime.    [provider]  haloperidol (HALDOL) 5 MG tablet Take 1 tablet (5 mg total) by mouth every 8 (eight) hours as needed for up to 30 doses for agitation. 07/18/23   Gloris Manchester, MD  hydrocortisone cream 1 % Apply 1 Application topically 2 (two) times daily as needed for itching (infection).    [provider]  levothyroxine (SYNTHROID) 88 MCG tablet Take 1 tablet (88 mcg total) by mouth daily. Patient taking differently: Take 88 mcg by mouth daily before breakfast. 11/17/18    Lamptey, Britta Mccreedy, MD  medroxyPROGESTERone (DEPO-PROVERA) 150  MG/ML injection Inject 150 mg into the muscle every 3 (three) months. Patient not taking: Reported on 07/18/2023 05/04/22   [provider]  metoprolol succinate (TOPROL-XL) 100 MG 24 hr tablet Take 1 tablet (100 mg total) by mouth daily. Take with or immediately following a meal. 06/30/22   Elgergawy, Leana Roe, MD  pantoprazole (PROTONIX) 40 MG tablet Take 40 mg by mouth 2 (two) times daily.    [provider]  QUEtiapine (SEROQUEL) 200 MG tablet Take 200 mg by mouth at bedtime.    [provider]  sertraline (ZOLOFT) 50 MG tablet Take 50 mg by mouth every evening.    [provider]     Critical care time: n/a    Melody Comas, MD [facility] Office: [phone]   See Amion for personal pager PCCM on call pager [phone] until 7pm. Please call Elink 7p-7a. [phone]

## 2023-10-07 NOTE — Assessment & Plan Note (Addendum)
 Patient had itching and burning with urination.  Urinalysis negative for infection.  Empirically treated for yeast infection with Diflucan 100 mg every other day for 3 doses.  This has resolved.

## 2023-10-07 NOTE — Evaluation (Signed)
 Physical Therapy Evaluation Patient Details Name: HERTHA GERGEN MRN: [id number] DOB: 1981/03/29 Today's Date: 10/07/2023  History of Present Illness  43 y.o. female adm 12/23, presenting with SOB and hypoxia. PE/CHF exacerbation ruled out with normal echo, negative CTPE, bilateral LE U/S, D-Dimer, and troponin. PMHx: borderline personality, anxiety, bipolar, DM, HLD, hypothyroidism, HTN, PICA, intellectual disability   Clinical Impression  Pt presents with condition above and deficits mentioned below, see PT Problem List. PTA, she was mod I utilizing a rollator for functional mobility, living in a 2-level group home with 7 STE. Her bedroom is on the main level. Currently, the pt displays deficits in cognition (unsure if baseline or not), activity tolerance/endurance, power, gross strength, distal lower extremity sensation, and balance. She is at risk for falls and is limited in mobility tolerance by DOE, elevated HR (up to 130s), and fatigue. She was only able to tolerate ambulating up to ~8-10 ft per bout with a RW and CGA, needing an intermittent standing rest break to even tolerate ambulating that distance. At this time, it does not appear that the pt has the endurance to navigate the x7 stairs to enter her home and ambulate around the home as needed. Thus, the pt may benefit from short-term inpatient rehab, < 3 hours/day. However, if the pt can progress quickly with acute therapy, then she may be able to progress to returning to her group home with [facility] or OP PT. Will continue to follow acutely.    If plan is discharge home, recommend the following: A little help with walking and/or transfers;A little help with bathing/dressing/bathroom;Assistance with cooking/housework;Assist for transportation;Help with stairs or ramp for entrance;Direct supervision/assist for financial management;Direct supervision/assist for medications management;Supervision due to cognitive status   Can travel by private  vehicle   Yes    Equipment Recommendations BSC/3in1;Wheelchair (measurements PT);Wheelchair cushion (measurements PT) (pending progress; bariatric)  Recommendations for Other Services       Functional Status Assessment Patient has had a recent decline in their functional status and demonstrates the ability to make significant improvements in function in a reasonable and predictable amount of time.     Precautions / Restrictions Precautions Precautions: Fall Recall of Precautions/Restrictions: Intact Precaution/Restrictions Comments: watch HR Restrictions Weight Bearing Restrictions Per Provider Order: No      Mobility  Bed Mobility               General bed mobility comments: Pt sitting in recliner at start and end of session.    Transfers Overall transfer level: Needs assistance Equipment used: Rolling walker (2 wheels) Transfers: Sit to/from Stand Sit to Stand: Contact guard assist           General transfer comment: Pt is able to transfer to stand from the recliner and the EOB 1x each with increased trunk flexion and time to extend trunk to stand fully upright. No LOB, CGA for safety. VCs provided to reach back for seat before sitting.    Ambulation/Gait Ambulation/Gait assistance: Contact guard assist Gait Distance (Feet): 10 Feet (x2 bouts of ~8-10 ft each bout) Assistive device: Rolling walker (2 wheels) Gait Pattern/deviations: Step-through pattern, Decreased step length - right, Decreased step length - left, Decreased stride length, Wide base of support Gait velocity: reduced Gait velocity interpretation: <1.31 ft/sec, indicative of household ambulator   General Gait Details: Pt takes slow, small steps, slightly wide BOS likely due to body habitus. Distance limited by elevated HR, pt reporting fatigue, and pt reporting feeling like her "legs are  about to buckle". No LOB, CGA for safety. x1 standing rest break during the first bout due to elevated HR and  RR and DOE 3/4. Pt recovered well with standing rest break and cues for pursed lip breathing  Stairs            Wheelchair Mobility     Tilt Bed    Modified Rankin (Stroke Patients Only)       Balance Overall balance assessment: Needs assistance Sitting-balance support: No upper extremity supported, Feet supported Sitting balance-Leahy Scale: Fair Sitting balance - Comments: able to sit unsupported at edge of recliner   Standing balance support: Bilateral upper extremity supported, Reliant on assistive device for balance, During functional activity Standing balance-Leahy Scale: Poor Standing balance comment: reliant on RW                             Pertinent Vitals/Pain Pain Assessment Pain Assessment: 0-10 Pain Score: 10-Worst pain ever Pain Location: chest (reports it began yesterday) Pain Descriptors / Indicators: Sharp, Stabbing Pain Intervention(s): Limited activity within patient's tolerance, Monitored during session    Home Living Family/patient expects to be discharged to:: Group home Living Arrangements: Non-relatives/Friends   Type of Home: House Home Access: Stairs to enter Entrance Stairs-Rails: Right;Left;Can reach both Entrance Stairs-Number of Steps: 7   Home Layout: Two level;Able to live on main level with bedroom/bathroom Home Equipment: Rolling Walker (2 wheels);Rollator (4 wheels)      Prior Function Prior Level of Function : Needs assist             Mobility Comments: Uses 4WRW ADLs Comments: Reports completes her own ADL, home assists with medications, community mobility and meals.     Extremity/Trunk Assessment   Upper Extremity Assessment Upper Extremity Assessment: Defer to OT evaluation    Lower Extremity Assessment Lower Extremity Assessment: RLE deficits/detail;LLE deficits/detail RLE Deficits / Details: edema noted bil, gross MMT scores of >/= 4/5, reports decreased sensation distally in feet with very  little to no sensation in toes bil (L>R) RLE Sensation: decreased light touch LLE Deficits / Details: edema noted bil, gross MMT scores of >/= 4/5, reports decreased sensation distally in feet with very little to no sensation in toes bil (L>R) LLE Sensation: decreased light touch    Cervical / Trunk Assessment Cervical / Trunk Assessment: Other exceptions Cervical / Trunk Exceptions: increased body habitus  Communication   Communication Communication: No apparent difficulties    Cognition Arousal: Alert Behavior During Therapy: WFL for tasks assessed/performed   PT - Cognitive impairments: No family/caregiver present to determine baseline                       PT - Cognition Comments: Pt displays deficits in processing speed. Able to recall location and self but reported it was April in the 1900s. She denies knowing why she is in the hospital. Unsure of whether this is baseline for her or not with her PMH of intellectual disability Following commands: Intact       Cueing Cueing Techniques: Verbal cues     General Comments General comments (skin integrity, edema, etc.): SpO2 >/= 91% on 3L when ambulting, 2L at rest; HR up to 130s with ambulating, recovered with rest    Exercises     Assessment/Plan    PT Assessment Patient needs continued PT services  PT Problem List Decreased strength;Decreased activity tolerance;Decreased balance;Decreased mobility;Decreased cognition;Impaired sensation;Obesity;Cardiopulmonary status limiting  activity       PT Treatment Interventions DME instruction;Gait training;Stair training;Functional mobility training;Therapeutic activities;Therapeutic exercise;Balance training;Neuromuscular re-education;Cognitive remediation;Patient/family education    PT Goals (Current goals can be found in the Care Plan section)  Acute Rehab PT Goals Patient Stated Goal: to improve PT Goal Formulation: With patient Time For Goal Achievement:  10/21/23 Potential to Achieve Goals: Good    Frequency Min 2X/week     Co-evaluation               AM-PAC PT "6 Clicks" Mobility  Outcome Measure Help needed turning from your back to your side while in a flat bed without using bedrails?: A Little Help needed moving from lying on your back to sitting on the side of a flat bed without using bedrails?: A Little Help needed moving to and from a bed to a chair (including a wheelchair)?: A Little Help needed standing up from a chair using your arms (e.g., wheelchair or bedside chair)?: A Little Help needed to walk in hospital room?: Total (<20 ft) Help needed climbing 3-5 steps with a railing? : Total 6 Click Score: 14    End of Session Equipment Utilized During Treatment: Gait belt;Oxygen Activity Tolerance: Patient limited by fatigue;Other (comment) (limited by elevated HR) Patient left: in chair;with call bell/phone within reach;with nursing/sitter in room (sitter reported no need for chair alarm due to sitter remaining with pt)   PT Visit Diagnosis: Unsteadiness on feet (R26.81);Other abnormalities of gait and mobility (R26.89);Muscle weakness (generalized) (M62.81);Difficulty in walking, not elsewhere classified (R26.2)    Time: 8295-6213 PT Time Calculation (min) (ACUTE ONLY): 16 min   Charges:   PT Evaluation $PT Eval Moderate Complexity: 1 Mod   PT General Charges $$ ACUTE PT VISIT: 1 Visit         Virgil Benedict, PT, DPT Acute Rehabilitation Services  Office: [phone]   Bettina Gavia 10/07/2023, 10:20 AM

## 2023-10-07 NOTE — Assessment & Plan Note (Addendum)
 Boarding in the ED for approximately 75 days while finding placement for patient.  Presented initially with self-injurious behavior such as swallowing several nonfood items.  Psychiatry consulted and signed off multiple times.  Most recent recommendations from them are as follows - Zoloft 100 mg daily, Seroquel 200 mg daily at bedtime, lamotrigine 25 mg x 2 weeks then increase to 50 mg daily, doxazosin 2 mg daily at bedtime - TOC attempting to find placement for patient - Continue one-to-one sitter

## 2023-10-07 NOTE — Assessment & Plan Note (Addendum)
 Developed shortness of breath while boarding in the ED with episode of hypoxia.  Workup including D-dimer x2, troponins, respiratory pathogen panel all unremarkable.  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  Remains on oxygen supplementation, I suspect her subjective shortness of breath may be in the setting of her right hemidiaphragm along with anxiety as it seems to worsen when she gets anxious, however she does become truly hypoxic requiring oxygen supplementation.  - Wean oxygen as able, O2 goal >88% - Xopenex neb q6h - Pulmonology consulted, appreciate recs - CPAP at night - Regular diet - Fall, delirium precautions - PT/OT eval, suspect deconditioning

## 2023-10-07 NOTE — Evaluation (Signed)
 Occupational Therapy Evaluation Patient Details Name: KARENANN MCGRORY MRN: [id number] DOB: 12/16/1980 Today's Date: 10/07/2023   History of Present Illness   43 y.o. female adm 12/23, presenting with SOB and hypoxia. PE/CHF exacerbation ruled out with normal echo, negative CTPE, bilateral LE U/S, D-Dimer, and troponin. PMHx: borderline personality, anxiety, bipolar, DM, HLD, hypothyroidism, HTN, PICA.     Clinical Impressions Patient admitted for the diagnosis above.  PTA she resides at a local group home and has assist for community mobility, meds, and meals.  Patient typically performs her own ADL and walks with a 4WRW.  SOB, poor activity tolerance and elevated HR are the barriers.  OT will continue efforts in the acute setting to address deficits, and it is hoped she will progress to a point where she can simply return home.  Patient reports she has done outpatient PT in the past.  Post acute rehab may be needed.  HR up to 141 with sit to stand and transfer to recliner.       If plan is discharge home, recommend the following:   A little help with bathing/dressing/bathroom;Assist for transportation;Supervision due to cognitive status     Functional Status Assessment   Patient has had a recent decline in their functional status and demonstrates the ability to make significant improvements in function in a reasonable and predictable amount of time.     Equipment Recommendations   None recommended by OT     Recommendations for Other Services         Precautions/Restrictions   Precautions Precautions: Fall Recall of Precautions/Restrictions: Intact Precaution/Restrictions Comments: watch HR Restrictions Weight Bearing Restrictions Per Provider Order: No     Mobility Bed Mobility Overal bed mobility: Needs Assistance Bed Mobility: Supine to Sit     Supine to sit: Supervision          Transfers Overall transfer level: Needs assistance Equipment used:  Rolling walker (2 wheels) Transfers: Sit to/from Stand, Bed to chair/wheelchair/BSC Sit to Stand: Contact guard assist     Step pivot transfers: Contact guard assist            Balance Overall balance assessment: Needs assistance Sitting-balance support: Feet supported Sitting balance-Leahy Scale: Good     Standing balance support: Reliant on assistive device for balance Standing balance-Leahy Scale: Fair                             ADL either performed or assessed with clinical judgement   ADL Overall ADL's : Needs assistance/impaired Eating/Feeding: Set up;Sitting   Grooming: Wash/dry hands;Wash/dry face;Set up;Sitting   Upper Body Bathing: Set up;Sitting   Lower Body Bathing: Moderate assistance;Sit to/from stand   Upper Body Dressing : Supervision/safety;Sitting   Lower Body Dressing: Moderate assistance;Sit to/from stand   Toilet Transfer: Contact guard assist;Rolling walker (2 wheels);BSC/3in1;Stand-pivot                   Vision Patient Visual Report: No change from baseline       Perception Perception: Not tested       Praxis Praxis: Not tested       Pertinent Vitals/Pain Pain Assessment Pain Assessment: Faces Faces Pain Scale: Hurts a little bit Pain Location: chest Pain Descriptors / Indicators: Tightness Pain Intervention(s): Monitored during session     Extremity/Trunk Assessment Upper Extremity Assessment Upper Extremity Assessment: Generalized weakness   Lower Extremity Assessment Lower Extremity Assessment: Defer to PT evaluation  Cervical / Trunk Assessment Cervical / Trunk Assessment: Normal   Communication Communication Communication: No apparent difficulties   Cognition Arousal: Alert Behavior During Therapy: WFL for tasks assessed/performed Cognition: History of cognitive impairments                               Following commands: Intact       Cueing  General Comments   Cueing  Techniques: Verbal cues   Watch HR, O2 stable on supplemental O2   Exercises     Shoulder Instructions      Home Living Family/patient expects to be discharged to:: Group home Living Arrangements: Non-relatives/Friends   Type of Home: House Home Access: Stairs to enter [employer] of Steps: 7 Entrance Stairs-Rails: Right;Left;Can reach both Home Layout: Two level;Able to live on main level with bedroom/bathroom     Bathroom Shower/Tub: Chief Strategy Officer: Standard Bathroom Accessibility: Yes How Accessible: Accessible via walker Home Equipment: Rolling Walker (2 wheels);Rollator (4 wheels)          Prior Functioning/Environment Prior Level of Function : Needs assist             Mobility Comments: Uses 4WRW ADLs Comments: Reports completes her own ADL, home assists with medications, community mobility and meals.    OT Problem List: Decreased activity tolerance;Decreased strength   OT Treatment/Interventions: Self-care/ADL training;Therapeutic activities;Therapeutic exercise;Energy conservation;DME and/or AE instruction;Patient/family education;Balance training      OT Goals(Current goals can be found in the care plan section)   Acute Rehab OT Goals Patient Stated Goal: Go home OT Goal Formulation: With patient Time For Goal Achievement: 10/21/23 Potential to Achieve Goals: Good ADL Goals Pt Will Perform Grooming: with modified independence;standing Pt Will Perform Lower Body Dressing: with modified independence;sit to/from stand Pt Will Transfer to Toilet: with modified independence;regular height toilet;ambulating Pt/caregiver will Perform Home Exercise Program: Increased strength;Both right and left upper extremity;With Supervision   OT Frequency:  Min 1X/week    Co-evaluation              AM-PAC OT "6 Clicks" Daily Activity     Outcome Measure Help from another person eating meals?: None Help from another person  taking care of personal grooming?: None Help from another person toileting, which includes using toliet, bedpan, or urinal?: A Little Help from another person bathing (including washing, rinsing, drying)?: A Lot Help from another person to put on and taking off regular upper body clothing?: None Help from another person to put on and taking off regular lower body clothing?: A Lot 6 Click Score: 19   End of Session Equipment Utilized During Treatment: Rolling walker (2 wheels);Oxygen Nurse Communication: Mobility status  Activity Tolerance: Patient tolerated treatment well Patient left: in chair;with call bell/phone within reach;with chair alarm set;with nursing/sitter in room  OT Visit Diagnosis: Unsteadiness on feet (R26.81)                Time: 4098-1191 OT Time Calculation (min): 20 min Charges:  OT General Charges $OT Visit: 1 Visit OT Evaluation $OT Eval Moderate Complexity: 1 Mod  10/07/2023  RP, OTR/L  Acute Rehabilitation Services  Office:  [phone]   Suzanna Obey 10/07/2023, 9:28 AM

## 2023-10-07 NOTE — Plan of Care (Signed)
 Spoke with Dr. Merrily Pew from Pulmonology for non-urgent consult related to continued hypoxia, they will see patient.  We greatly appreciate pulmonology's assistance with this patient's care.

## 2023-10-07 NOTE — Progress Notes (Signed)
SATURATION QUALIFICATIONS: (This note is used to comply with regulatory documentation for home oxygen)  Patient Saturations on Room Air at Rest = 88%  Patient Saturations on Room Air while Ambulating = 85%  Patient Saturations on 2 Liters of oxygen while Ambulating =93%  Please briefly explain why patient needs home oxygen: 

## 2023-10-08 ENCOUNTER — Telehealth (HOSPITAL_COMMUNITY): Payer: Self-pay | Admitting: Pharmacy Technician

## 2023-10-08 ENCOUNTER — Other Ambulatory Visit (HOSPITAL_COMMUNITY): Payer: Self-pay

## 2023-10-08 DIAGNOSIS — E662 Morbid (severe) obesity with alveolar hypoventilation: Secondary | ICD-10-CM | POA: Diagnosis not present

## 2023-10-08 DIAGNOSIS — R0600 Dyspnea, unspecified: Secondary | ICD-10-CM

## 2023-10-08 DIAGNOSIS — J9601 Acute respiratory failure with hypoxia: Secondary | ICD-10-CM | POA: Diagnosis not present

## 2023-10-08 DIAGNOSIS — R051 Acute cough: Secondary | ICD-10-CM | POA: Diagnosis not present

## 2023-10-08 DIAGNOSIS — R0902 Hypoxemia: Secondary | ICD-10-CM | POA: Diagnosis not present

## 2023-10-08 DIAGNOSIS — Z23 Encounter for immunization: Secondary | ICD-10-CM | POA: Diagnosis not present

## 2023-10-08 DIAGNOSIS — Z1152 Encounter for screening for COVID-19: Secondary | ICD-10-CM | POA: Diagnosis not present

## 2023-10-08 DIAGNOSIS — Z7289 Other problems related to lifestyle: Secondary | ICD-10-CM | POA: Diagnosis not present

## 2023-10-08 LAB — TROPONIN I (HIGH SENSITIVITY): Troponin I (High Sensitivity): 3 ng/L (ref <18)

## 2023-10-08 MED ORDER — UMECLIDINIUM-VILANTEROL 62.5-25 MCG/ACT IN AEPB
1.0000 | INHALATION_SPRAY | Freq: Every day | RESPIRATORY_TRACT | Status: DC
  Administered 2023-10-10 – 2023-10-13 (×4): 1 via RESPIRATORY_TRACT
  Filled 2023-10-08: qty 14

## 2023-10-08 NOTE — Plan of Care (Addendum)
 FMTS Interim Progress Note  S:Messaged by RN for new chest pain in patient. On evaluation patient states this is brand new, started a couple minutes ago. Describes as sharp and tingling, at the center of her chest and goes "everywhere." Worse with breathing in. Tender to the touch as well. Was not exerting herself when this started.  O: BP (!) 107/56   Pulse 98   Temp 98 F (36.7 C) (Oral)   Resp 20   Ht 5\' 3"  (1.6 m)   Wt (!) 162.6 kg   SpO2 94%   BMI 63.50 kg/m   General: NAD, talking comfortably, restless Neuro: A&O Cardiovascular: mild tachycardia, regular rhythm, no murmurs, no peripheral edema, no obvious rash Respiratory: normal WOB on 2L [place], CTAB, no wheezes, ronchi or rales Abdomen: soft, NTTP, no rebound or guarding Extremities: Moving all 4 extremities equally, 2+ left radial pulse Sinus rhythm on monitor.   A/P: Chest pain Atypical characteristics. Hemodynamically stable. Suspect multifactorial due to costochondritis versus anxiety components. Low concern for ACS but will rule out given persistent dyspnea. -Tylenol now -Troponin -EKG  Celine Mans, MD 10/08/2023, 7:54 PM PGY-2, [facility] pager [phone]   ADDENDUM 20:43 EKG reviewed - Sinus tachycardia. Probable left atrial enlargement. No evidence of ischemia.  ADDENDUM 00:09 Troponin 3. Continue to monitor. Pain control
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
## 2023-10-08 NOTE — Plan of Care (Signed)

## 2023-10-08 NOTE — Assessment & Plan Note (Addendum)
 Admitted for shortness of breath and respiratory failure which has improved, however still with intermittent episodes of hypoxia. Remains with 2L [place] oxygen requirement. Likely related to asthma, deconditioning, OHS, and bronchitis.  - Pulmonology consulted, appreciate recommendations - Budesonide and Brovana nebulizers BID - Xopenex neb q6h PRN - Zpak for bronchitis - CPAP at night for OSA - Wean O2 as able, O2 goal 88-92% - Pulse ox with vital checks - PT/OT eval - Weekly CBC, BMP - Iron studies to assess for pica - Checking with pharmacy to see if Reginal Lutes would be possible to start inpatient

## 2023-10-08 NOTE — Progress Notes (Signed)
 Physical Therapy Treatment Patient Details Name: Megan Manning MRN: [id number] DOB: 11/25/1980 Today's Date: 10/08/2023   History of Present Illness 43 y.o. female adm 12/23, presenting with SOB and hypoxia. PE/CHF exacerbation ruled out with normal echo, negative CTPE, bilateral LE U/S, D-Dimer, and troponin. PMHx: borderline personality, anxiety, bipolar, DM, HLD, hypothyroidism, HTN, PICA, intellectual disability    PT Comments  Session focused on theract to promote functional mobility and transfers. Pt displayed improvements in the amount of assistance required for sit to stands and sit to supine. Pt continues to display difficulties in functional mobility, gait, balance, and activity tolerance, partially s/t ranging vitals with mobility. Pt would benefit from continued acute PT and rehab follow-up s/p d/c. Will continue to follow acutely.     If plan is discharge home, recommend the following: A little help with walking and/or transfers;A little help with bathing/dressing/bathroom;Assistance with cooking/housework;Assist for transportation;Help with stairs or ramp for entrance;Direct supervision/assist for financial management;Direct supervision/assist for medications management;Supervision due to cognitive status   Can travel by private vehicle     Yes  Equipment Recommendations  BSC/3in1;Wheelchair (measurements PT);Wheelchair cushion (measurements PT) (pending progress; bariatric)    Recommendations for Other Services       Precautions / Restrictions Precautions Precautions: Fall Recall of Precautions/Restrictions: Intact Precaution/Restrictions Comments: watch HR Restrictions Weight Bearing Restrictions Per Provider Order: No     Mobility  Bed Mobility Overal bed mobility: Needs Assistance Bed Mobility: Supine to Sit, Sit to Supine     Supine to sit: HOB elevated, Used rails, Supervision Sit to supine: Supervision, Used rails   General bed mobility comments: Pt uses  bed rails and elevated HOB to come to EOB. Required increased time and fwd momentum. Pt is supervision for sit to supine, required some extra and slight cueing for hand placement    Transfers Overall transfer level: Needs assistance Equipment used: Rolling walker (2 wheels) Transfers: Sit to/from Stand Sit to Stand: Min assist, Contact guard assist           General transfer comment: Min A for power up and hand placement with1st transfer. CGA for safety with 2nd transfer. Pt stands from lowest bed height. Completes with slightly increased time and reports of dizziness and lightheadedness with each transfer    Ambulation/Gait               General Gait Details: deferred today s/t reported dizziness/lightheadedness, and pt vitals   Stairs             Wheelchair Mobility     Tilt Bed    Modified Rankin (Stroke Patients Only)       Balance Overall balance assessment: Needs assistance Sitting-balance support: No upper extremity supported, Feet supported Sitting balance-Leahy Scale: Fair Sitting balance - Comments: Pt sits unsupported at EOB. No LOB   Standing balance support: Bilateral upper extremity supported, Reliant on assistive device for balance, During functional activity Standing balance-Leahy Scale: Poor Standing balance comment: pt reliant on RW. Stands with full trunk extension                            Communication Communication Communication: Impaired Factors Affecting Communication: Reduced clarity of speech  Cognition Arousal: Alert Behavior During Therapy: WFL for tasks assessed/performed   PT - Cognitive impairments: No family/caregiver present to determine baseline  PT - Cognition Comments: Pt displays difficulty in processing verbal information, initiation of movement, and reduced clarity of speech Following commands: Intact      Cueing Cueing Techniques: Verbal cues  Exercises General  Exercises - Lower Extremity Hip Flexion/Marching: AROM, Strengthening, Both, 15 reps, Seated    General Comments General comments (skin integrity, edema, etc.): Pt required 3 L by [place] to maintain SpO2 > 90% with mobility. Pt's BP reached 151/76 (77) and HR 116 s/p standing. HR varied throughout session maxing at 145 bpm.      Pertinent Vitals/Pain Pain Assessment Pain Assessment: Faces Faces Pain Scale: Hurts a little bit Pain Location: abdomen Pain Descriptors / Indicators: Grimacing, Guarding Pain Intervention(s): Monitored during session    Home Living                          Prior Function            PT Goals (current goals can now be found in the care plan section) Acute Rehab PT Goals Patient Stated Goal: to improve PT Goal Formulation: With patient Time For Goal Achievement: 10/21/23 Potential to Achieve Goals: Good Progress towards PT goals: Progressing toward goals    Frequency    Min 2X/week      PT Plan      Co-evaluation              AM-PAC PT "6 Clicks" Mobility   Outcome Measure  Help needed turning from your back to your side while in a flat bed without using bedrails?: A Little Help needed moving from lying on your back to sitting on the side of a flat bed without using bedrails?: A Little Help needed moving to and from a bed to a chair (including a wheelchair)?: A Little Help needed standing up from a chair using your arms (e.g., wheelchair or bedside chair)?: A Little Help needed to walk in hospital room?: Total (<20 ft) Help needed climbing 3-5 steps with a railing? : Total 6 Click Score: 14    End of Session Equipment Utilized During Treatment: Gait belt;Oxygen Activity Tolerance: Patient limited by fatigue;Other (comment) (limited by elevated HR) Patient left: in bed;with call bell/phone within reach;with bed alarm set;with nursing/sitter in room Nurse Communication: Mobility status;Other (comment) (results of O2  requirements needed for mobility) PT Visit Diagnosis: Unsteadiness on feet (R26.81);Other abnormalities of gait and mobility (R26.89);Muscle weakness (generalized) (M62.81);Difficulty in walking, not elsewhere classified (R26.2)     Time: 4098-1191 PT Time Calculation (min) (ACUTE ONLY): 27 min  Charges:    $Therapeutic Activity: 23-37 mins PT General Charges $$ ACUTE PT VISIT: 1 Visit                     [address], SPT    [place] 10/08/2023, 12:57 PM

## 2023-10-08 NOTE — TOC Progression Note (Signed)
 Transition of Care [facility]) - Progression Note    Patient Details  Name: Megan Manning MRN: [id number] Date of Birth: 21-Apr-1981  [facility] CM/SW Contact  Delilah Shan, LCSWA Phone Number: 10/08/2023, 12:34 PM  Clinical Narrative:     CSW received consult for possible SNF placement at time of discharge.CSW spoke with [name] at [facility] regarding PT recommendation of SNF placement at time of discharge. CSW discussed potential barriers to placement for patient. [name] expressed understanding of PT recommendation and is agreeable to SNF placement for patient at time of discharge. [name] informed CSW that she received confirmation that single case agreement between [facility] was received. Next step is to schedule walk through of the facility to approve the site. [name] gave CSW permission to fax out initial referral for SNF placement for patient. CSW discussed insurance authorization process. No further questions reported at this time. [name] tele# to follow up with SNF bed offers is [phone] [email or web address] to continue to follow and assist with discharge planning needs.   Expected Discharge Plan: Group Home Barriers to Discharge: Insurance Authorization  Expected Discharge Plan and Services                                               Social Determinants of Health (SDOH) Interventions SDOH Screenings   Food Insecurity: No Food Insecurity (10/04/2023)  Housing: Low Risk  (10/04/2023)  Transportation Needs: No Transportation Needs (10/04/2023)  Utilities: Not At Risk (10/04/2023)  Financial Resource Strain: Low Risk  (09/02/2022)   Received from [facility], [facility]  Social Connections: Unknown (12/09/2021)   Received from [facility]  Stress: No Stress Concern Present (04/15/2023)   Received from [facility]  Recent Concern: Stress - Stress Concern Present (04/05/2023)   Received from [facility]  Tobacco Use: Low Risk  (07/19/2023)    Readmission Risk Interventions     No data to display

## 2023-10-08 NOTE — Progress Notes (Addendum)
     Daily Progress Note Intern Pager: [phone]  Patient name: Megan Manning Medical record number: [id number] Date of birth: 27-Jun-1981 Age: 43 y.o. Gender: female  Primary Care Provider: Angelica Pou, NP Consultants: None Code Status: Full  Pt Overview and Major Events to Date:  12/19-3/7: Boarding in ED pending placement 3/8: Admitted to FMTS  Assessment and Plan:  42yo female with PMHx T2DM, OSA, GERD, hypothyroidism, hypertension, asthma who initially admitted with acute hypoxemic respiratory failure and shortness of breath. Pt is medically stable for discharge.  Assessment & Plan Self-injurious behavior Boarding in the ED for several months while finding placement.  Was evaluated in the ED initially for swallowing nonfood items.  Psychiatry consulted several times and signed off. - Continue Zoloft 100 mg daily, Seroquel 200 mg daily at bedtime, lamotrigine 25 mg x 2 weeks then increase to 50 mg daily, doxazosin 2 mg daily at bedtime - TOC following - Continue one-to-one sitter for safety Shortness of breath Admitted for shortness of breath and respiratory failure which has improved, however still with intermittent episodes of hypoxia. Remains with 2L [place] oxygen requirement. Likely related to asthma, deconditioning, OHS, and bronchitis.  - Pulmonology consulted, appreciate recommendations - Budesonide and Brovana nebulizers BID - Xopenex neb q6h PRN - Zpak for bronchitis - CPAP at night for OSA - Wean O2 as able, O2 goal 88-92% - Pulse ox with vital checks - PT/OT eval - Weekly CBC, BMP - Iron studies to assess for pica - Checking with pharmacy to see if Reginal Lutes would be possible to start inpatient  Hypertension Intermittent elevations typically associated with tachycardia, suspect anxiety/movement related - Continue to monitor  Chronic and Stable Problems:  T2DM - controlled, last A1C 6.0 10/02/33 OSA - continue CPAP nightly GERD - continue protonix 40mg   BID Hypothyroidism - continue Synthroid daily   FEN/GI: Finger food diet PPx: Lovenox Dispo: Alternative family living facility  Barriers include bed availability, finding placement.   Subjective:  NAEON. States her breathing is the same.  Objective: Temp:  [97.8 F (36.6 C)-99.5 F (37.5 C)] 97.8 F (36.6 C) (03/13 1811) Pulse Rate:  [90-146] 97 (03/13 1835) Resp:  [20-24] 20 (03/13 1835) BP: (104-130)/(55-87) 117/78 (03/14 0827) SpO2:  [85 %-98 %] 91 % (03/14 0827) FiO2 (%):  [28 %] 28 % (03/13 2236) Physical Exam: General: no acute distress, laying in bed receiving nebulizer treatment Cardiovascular: RRR, no m/r/g Respiratory: CTAB, normal work of breathing with nebulizer treatment  Abdomen: Soft, non tender, bowel sounds present  Laboratory: Most recent CBC Lab Results  Component Value Date   WBC 7.0 10/03/2023   HGB 11.6 (L) 10/03/2023   HCT 37.9 10/03/2023   MCV 86.7 10/03/2023   PLT 234 10/03/2023   Most recent BMP    Latest Ref Rng & Units 10/03/2023    3:45 PM  BMP  Glucose 70 - 99 mg/dL 528   BUN 6 - 20 mg/dL 8   Creatinine 4.13 - 2.44 mg/dL 0.10   Sodium 272 - 536 mmol/L 139   Potassium 3.5 - 5.1 mmol/L 4.5   Chloride 98 - 111 mmol/L 105   CO2 22 - 32 mmol/L 25   Calcium 8.9 - 10.3 mg/dL 9.4     Dejai Schubach, DO 10/08/2023, 10:59 AM  PGY-1, [facility] FPTS Intern pager: [phone], text pages welcome Secure chat group [facility] Teaching Service

## 2023-10-08 NOTE — Assessment & Plan Note (Addendum)
 Boarding in the ED for several months while finding placement.  Was evaluated in the ED initially for swallowing nonfood items.  Psychiatry consulted several times and signed off. - Continue Zoloft 100 mg daily, Seroquel 200 mg daily at bedtime, lamotrigine 25 mg x 2 weeks then increase to 50 mg daily, doxazosin 2 mg daily at bedtime - TOC following - Continue one-to-one sitter for safety

## 2023-10-08 NOTE — Progress Notes (Signed)
 10/08/2023     I have seen and evaluated the patient for hypoxmia and abnormal CXR.   S:  No change in symptoms of DOE and coughing up green sputum.   O: Blood pressure 96/66, pulse 64, temperature 97.6 F (36.4 C), temperature source Oral, resp. rate 17, height 5\' 3"  (1.6 m), weight 81.6 kg, SpO2 94 %.    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  Only thing to do is CPAP vs. BIPAP and O2 PRN, sleeping upright will also help her.  For the bronchitis-type symptoms: Can check sputum culture if she's truly coughing up stuff and treat whatever comes up.  We can try anoro (vs. Breo) but suspect this is also related to her R lung atelectasis and will be recurrent.  Can also try flutter valve (ordered)  She can f/u with pulmonary PRN should her breathing or possible bronchitis continue to worsen.  Available PRN   Myrla Halsted MD [facility] Prefer epic messenger for cross cover needs If after hours, please call E-link

## 2023-10-08 NOTE — Telephone Encounter (Signed)
 Pharmacy Patient Advocate Encounter   Received notification  that prior authorization for Ozempic (0.25 or 0.5 MG/DOSE) 2MG /3ML pen-injectors is required/requested.   Insurance verification completed.   The patient is insured through [facility] .   Per test claim: PA required; PA submitted to above mentioned insurance via CoverMyMeds Key/confirmation #/EOC [facility] Status is pending

## 2023-10-08 NOTE — Assessment & Plan Note (Addendum)
 Intermittent elevations typically associated with tachycardia, suspect anxiety/movement related - Continue to monitor

## 2023-10-08 NOTE — Progress Notes (Addendum)
 Inpatient Rehabilitation Admissions Coordinator   Rehab consult received per Dr Rexene Alberts request. PT eval 3/13 recommends short term inpatient rehab, < 3 hours /day/ SNF level rehab. Noted has been in ED since 07/15/23 when admitted to hospital  on 10/02/23 for shortness of breath. She lacks the medical neccesity for CIR admit as well as she needs long term disposition which we are not that venue. Acute team and TOC made aware. We will sign off.  Ottie Glazier, RN, MSN Rehab Admissions Coordinator [phone] 10/08/2023 11:55 AM

## 2023-10-09 DIAGNOSIS — R0902 Hypoxemia: Secondary | ICD-10-CM | POA: Diagnosis not present

## 2023-10-09 LAB — TROPONIN I (HIGH SENSITIVITY): Troponin I (High Sensitivity): 2 ng/L (ref <18)

## 2023-10-09 NOTE — Assessment & Plan Note (Addendum)
 New overnight. Atypical characteristics. Hemodynamically stable. Suspect multifactorial due to costochondritis versus anxiety components. ACS unlikely given reassuring EKG and normal troponin. -Tylenol and Ibuprofen prn for pain

## 2023-10-09 NOTE — Assessment & Plan Note (Addendum)
 T2DM: Last A1c 5.65 months ago.  Controlled OSA: Continue nighttime CPAP GERD: Continue Protonix 40 mg BID Hypothyroidism: Continue Synthroid 88 mcg daily

## 2023-10-09 NOTE — Assessment & Plan Note (Signed)
 Overall controlled-mild elevations alongside tachycardia likely associated with anxiety/movement. -Monitor -Metoprolol XR 25 mg daily

## 2023-10-09 NOTE — Assessment & Plan Note (Signed)
 Boarding in the ED for several months while finding placement.  Was evaluated in the ED initially for swallowing nonfood items.  Psychiatry consulted several times and signed off. - Continue Zoloft 100 mg daily, Seroquel 200 mg daily at bedtime, lamotrigine 25 mg x 2 weeks then increase to 50 mg daily, doxazosin 2 mg daily at bedtime, continue Sertraline 100mg  QHS - TOC following - Continue one-to-one sitter for safety

## 2023-10-09 NOTE — Assessment & Plan Note (Signed)
 Controlled, Intermittent elevations typically associated with tachycardia, suspect anxiety/movement related. - Continue to monitor - Continue Metoprolol 25mg  XR daily

## 2023-10-09 NOTE — Progress Notes (Signed)
 ***  date/time  Daily Progress Note Intern Pager: [phone]  Patient name: Megan Manning Medical record number: [id number] Date of birth: January 14, 1981 Age: 43 y.o. Gender: female  Primary Care Provider: Angelica Pou, NP Consultants: Psychiatry, Pulmonology Code Status: FULL  Pt Overview and Major Events to Date:  12/19-3/7- boarded in ED pending placement  3/8- admitted to FMTS  Assessment and Plan:  Megan Manning is a 43 year old female admitted for acute hypoxic respiratory failure secondary to asthma exacerbation in the setting of OHS and phrenic nerve palsy.  Now resolved and remains medically stable for discharge. Pertinent PMH/PSH includes T2DM, OSA, GERD, hypothyroidism, HTN, asthma.  Assessment & Plan Self-injurious behavior Patient boarded in the ED for several months while finding placement.  Was evaluated in the ED for swallowing nonfood items psychiatry has been consulted several times and signed off. -Continue Zoloft 100 mg nightly, Seroquel 200 mg nightly, lamotrigine 25 mg daily for 2 weeks then increase to 50 mg daily, doxazosin 2 mg nightly -TOC following for placement -Continue one-to-one sitter for safety SOB  Asthma  OHS Respiratory status is stable, intermittently using 2 L.***Iron/ferritin -Pulmonology available as needed, appreciate care -Anoro Ellipta 1 puff daily (not been receiving recently) -Xopenex every 6 hours as needed -Azithromycin 50 mg daily 3/14 - 3/17 -CPAP nightly -Wean oxygen as tolerated, keep O2 88 to 92% -Sputum culture ordered, not collected -Pharmacy to see if GLP-1 possible inpatient Hypertension Overall controlled-mild elevations alongside tachycardia likely associated with anxiety/movement. -Monitor -Metoprolol XR 25 mg daily  Chronic and Stable Issues: T2DM-last A1c 5.6 five months ago  OSA-CPAP nightly GERD-Protonix 40 twice daily Hypothyroidism-to 88 mcg daily Allergies-loratadine 10 mg daily  FEN/GI: Finger foods PPx:  Lovenox Dispo:SNF. Barriers include placement, medically stable.   Subjective:  ***  Objective: Temp:  [98.6 F (37 C)-99.2 F (37.3 C)] 99.2 F (37.3 C) (03/15 2045) Pulse Rate:  [92-129] 98 (03/15 2045) Resp:  [20] 20 (03/15 1855) BP: (115-154)/(63-97) 154/89 (03/15 2045) SpO2:  [91 %-96 %] 96 % (03/15 2045) FiO2 (%):  [32 %] 32 % (03/14 2345) Physical Exam: General: *** Cardiovascular: *** Respiratory: *** Abdomen: *** Extremities: ***  Laboratory: Most recent CBC Lab Results  Component Value Date   WBC 7.0 10/03/2023   HGB 11.6 (L) 10/03/2023   HCT 37.9 10/03/2023   MCV 86.7 10/03/2023   PLT 234 10/03/2023   Most recent BMP    Latest Ref Rng & Units 10/03/2023    3:45 PM  BMP  Glucose 70 - 99 mg/dL 784   BUN 6 - 20 mg/dL 8   Creatinine 6.96 - 2.95 mg/dL 2.84   Sodium 132 - 440 mmol/L 139   Potassium 3.5 - 5.1 mmol/L 4.5   Chloride 98 - 111 mmol/L 105   CO2 22 - 32 mmol/L 25   Calcium 8.9 - 10.3 mg/dL 9.4     Other pertinent labs - no new  Imaging/Diagnostic Tests: No new imaging   Levin Erp, MD 10/09/2023, 10:21 PM  PGY-3, [facility] FPTS Intern pager: [phone], text pages welcome Secure chat group [facility] Teaching Service

## 2023-10-09 NOTE — Plan of Care (Signed)
   Problem: Education: Goal: Knowledge of General Education information will improve Description Including pain rating scale, medication(s)/side effects and non-pharmacologic comfort measures Outcome: Progressing   Problem: Health Behavior/Discharge Planning: Goal: Ability to manage health-related needs will improve Outcome: Progressing

## 2023-10-09 NOTE — Plan of Care (Signed)
  Problem: Clinical Measurements: Goal: Respiratory complications will improve Outcome: Progressing   Problem: Activity: Goal: Risk for activity intolerance will decrease Outcome: Progressing   Problem: Coping: Goal: Level of anxiety will decrease Outcome: Progressing   Problem: Pain Managment: Goal: General experience of comfort will improve and/or be controlled Outcome: Progressing   Problem: Safety: Goal: Ability to remain free from injury will improve Outcome: Progressing

## 2023-10-09 NOTE — Assessment & Plan Note (Signed)
 Patient boarded in the ED for several months while finding placement.  Was evaluated in the ED for swallowing nonfood items psychiatry has been consulted several times and signed off. -Continue Zoloft 100 mg nightly, Seroquel 200 mg nightly, lamotrigine 25 mg daily for 2 weeks then increase to 50 mg daily, doxazosin 2 mg nightly -TOC following for placement -Continue one-to-one sitter for safety

## 2023-10-09 NOTE — Progress Notes (Signed)
 Daily Progress Note Intern Pager: [phone]  Patient name: Megan Manning Medical record number: [id number] Date of birth: 05-08-1981 Age: 43 y.o. Gender: female  Primary Care Provider: Angelica Pou, NP Consultants: Psychiatry, Pulmonology Code Status: Full  Pt Overview and Major Events to Date:  12/19-3/7: Boarding in ED pending placement 3/8: Admitted to FMTS  Assessment and Plan:  Megan Manning is a 43 y.o. admitted for acute hypoxic respiratory failure secondary to asthma exacerbation in the setting of OHS and phrenic nerve palsy. Now resolved, she is currently medically stable for discharge. Pertinent PMHx includes 43yo T2DM, OSA, GERD, hypothyroidism, hypertension, asthma. Assessment & Plan Self-injurious behavior Boarding in the ED for several months while finding placement.  Was evaluated in the ED initially for swallowing nonfood items.  Psychiatry consulted several times and signed off. - Continue Zoloft 100 mg daily, Seroquel 200 mg daily at bedtime, lamotrigine 25 mg x 2 weeks then increase to 50 mg daily, doxazosin 2 mg daily at bedtime, continue Sertraline 100mg  QHS - TOC following - Continue one-to-one sitter for safety SOB  Asthma  OHS Respiratory status stable. Currently off of oxygen.  - Pulmonology consulted, appreciate recommendations - Anoro Ellipta 1 puff daily - Xopenex neb q6h PRN - Azithromycin 250mg  daily, finishes 03/17 - CPAP at night for OSA - Wean O2 as able, O2 goal 88-92% - Pulse ox with vital checks - PT/OT eval - Weekly CBC, BMP - Iron studies to assess for pica - Checking with pharmacy to see if Reginal Lutes would be possible to start inpatient - Expectorated sputum culture Hypertension Controlled, Intermittent elevations typically associated with tachycardia, suspect anxiety/movement related. - Continue to monitor - Continue Metoprolol 25mg  XR daily Chronic health problem T2DM: Last A1c 5.65 months ago.  Controlled OSA: Continue  nighttime CPAP GERD: Continue Protonix 40 mg BID Hypothyroidism: Continue Synthroid 88 mcg daily Chest pain New overnight. Atypical characteristics. Hemodynamically stable. Suspect multifactorial due to costochondritis versus anxiety components. ACS unlikely given reassuring EKG and normal troponin. -Tylenol and Ibuprofen prn for pain    FEN/GI: Finger foods PPx: Lovenox Dispo:Pending placement for safe discharge.  Subjective:  Atypical chest pain overnight.  Described as sharp tingling in the center of her chest and goes everywhere.  Worse with breathing and also tender to the touch.  Was not exerting herself when it started.  Resolved with Tylenol and ibuprofen.  No concerns on rounding this a.m.  Patient comfortably sleeping.  Objective: Temp:  [98 F (36.7 C)-99 F (37.2 C)] 99 F (37.2 C) (03/14 2345) Pulse Rate:  [95-111] 105 (03/14 2345) Resp:  [20] 20 (03/14 2345) BP: (96-136)/(56-96) 115/96 (03/14 2345) SpO2:  [91 %-96 %] 94 % (03/14 2345) Physical Exam: General: NAD, lying comfortably in hospital bed Neuro: A&O, awakens to touch Cardiovascular: RRR, no murmurs, no peripheral edema Respiratory: normal WOB on CPAP, CTAB, no wheezes, ronchi or rales Abdomen: soft, NTTP, no rebound or guarding Extremities: Moving all 4 extremities equally   Laboratory: Most recent CBC Lab Results  Component Value Date   WBC 7.0 10/03/2023   HGB 11.6 (L) 10/03/2023   HCT 37.9 10/03/2023   MCV 86.7 10/03/2023   PLT 234 10/03/2023   Most recent BMP    Latest Ref Rng & Units 10/03/2023    3:45 PM  BMP  Glucose 70 - 99 mg/dL 027   BUN 6 - 20 mg/dL 8   Creatinine 2.53 - 6.64 mg/dL 4.03   Sodium 474 - 259 mmol/L  139   Potassium 3.5 - 5.1 mmol/L 4.5   Chloride 98 - 111 mmol/L 105   CO2 22 - 32 mmol/L 25   Calcium 8.9 - 10.3 mg/dL 9.4     Troponin 3  Imaging/Diagnostic Tests: No new imaging.  EKG - Atrial Enlargement, Sinus Rhythm  Celine Mans, MD 10/09/2023, 1:44  AM  PGY-2, [facility] FPTS Intern pager: [phone], text pages welcome Secure chat group [facility] Teaching Service

## 2023-10-09 NOTE — Assessment & Plan Note (Addendum)
 Respiratory status stable. Currently off of oxygen.  - Pulmonology consulted, appreciate recommendations - Anoro Ellipta 1 puff daily - Xopenex neb q6h PRN - Azithromycin 250mg  daily, finishes 03/17 - CPAP at night for OSA - Wean O2 as able, O2 goal 88-92% - Pulse ox with vital checks - PT/OT eval - Weekly CBC, BMP - Iron studies to assess for pica - Checking with pharmacy to see if Reginal Lutes would be possible to start inpatient - Expectorated sputum culture

## 2023-10-09 NOTE — Assessment & Plan Note (Signed)
 Respiratory status is stable, intermittently using 2 L.***Iron/ferritin -Pulmonology available as needed, appreciate care -Anoro Ellipta 1 puff daily (not been receiving recently) -Xopenex every 6 hours as needed -Azithromycin 50 mg daily 3/14 - 3/17 -CPAP nightly -Wean oxygen as tolerated, keep O2 88 to 92% -Sputum culture ordered, not collected -Pharmacy to see if GLP-1 possible inpatient

## 2023-10-10 DIAGNOSIS — R0902 Hypoxemia: Secondary | ICD-10-CM | POA: Diagnosis not present

## 2023-10-10 NOTE — Plan of Care (Signed)
   Problem: Education: Goal: Knowledge of General Education information will improve Description Including pain rating scale, medication(s)/side effects and non-pharmacologic comfort measures Outcome: Progressing   Problem: Health Behavior/Discharge Planning: Goal: Ability to manage health-related needs will improve Outcome: Progressing

## 2023-10-10 NOTE — Progress Notes (Signed)
 Mobility Specialist Progress Note:   10/10/23 1615  Mobility  Activity Ambulated with assistance in room  Level of Assistance Contact guard assist, steadying assist  Assistive Device Other (Comment) (HHA)  Distance Ambulated (ft) 10 ft  Activity Response Tolerated well  Mobility Referral Yes  Mobility visit 1 Mobility  Mobility Specialist Start Time (ACUTE ONLY) 1615  Mobility Specialist Stop Time (ACUTE ONLY) 1630  Mobility Specialist Time Calculation (min) (ACUTE ONLY) 15 min   Post Mobility: BP 108/94 (100)  Pt agreeable to mobility session. Required only CGA assist via HHA to ambulate short distance in room. Pt c/o dizziness upon standing, subsided with static standing. Able to ambulate 67ft before stating she is too tired and needed to return to bed. VSS on 4LO2. Pt left with all needs met.   Of note: sitter stated pt has been going back and forth to bathroom with no issues.   Addison Lank Mobility Specialist Please contact via SecureChat or  Rehab office at [phone]

## 2023-10-11 ENCOUNTER — Other Ambulatory Visit (HOSPITAL_COMMUNITY): Payer: Self-pay

## 2023-10-11 ENCOUNTER — Telehealth (HOSPITAL_COMMUNITY): Payer: Self-pay | Admitting: Pharmacy Technician

## 2023-10-11 DIAGNOSIS — Z7289 Other problems related to lifestyle: Secondary | ICD-10-CM | POA: Diagnosis not present

## 2023-10-11 LAB — IRON AND TIBC
Iron: 55 ug/dL (ref 28–170)
Saturation Ratios: 13 % (ref 10.4–31.8)
TIBC: 440 ug/dL (ref 250–450)
UIBC: 385 ug/dL

## 2023-10-11 LAB — BASIC METABOLIC PANEL
Anion gap: 7 (ref 5–15)
BUN: 6 mg/dL (ref 6–20)
CO2: 25 mmol/L (ref 22–32)
Calcium: 8.5 mg/dL — ABNORMAL LOW (ref 8.9–10.3)
Chloride: 108 mmol/L (ref 98–111)
Creatinine, Ser: 0.76 mg/dL (ref 0.44–1.00)
GFR, Estimated: >60 mL/min (ref >60)
Glucose, Bld: 95 mg/dL (ref 70–99)
Potassium: 4 mmol/L (ref 3.5–5.1)
Sodium: 140 mmol/L (ref 135–145)

## 2023-10-11 LAB — CBC
HCT: 34.6 % — ABNORMAL LOW (ref 36.0–46.0)
Hemoglobin: 10.8 g/dL — ABNORMAL LOW (ref 12.0–15.0)
MCH: 27.3 pg (ref 26.0–34.0)
MCHC: 31.2 g/dL (ref 30.0–36.0)
MCV: 87.6 fL (ref 80.0–100.0)
Platelets: 245 10*3/uL (ref 150–400)
RBC: 3.95 MIL/uL (ref 3.87–5.11)
RDW: 13.3 % (ref 11.5–15.5)
WBC: 6.1 10*3/uL (ref 4.0–10.5)
nRBC: 0 % (ref 0.0–0.2)

## 2023-10-11 LAB — FERRITIN: Ferritin: 29 ng/mL (ref 11–307)

## 2023-10-11 MED ORDER — FERROUS SULFATE 325 (65 FE) MG PO TABS
325.0000 mg | ORAL_TABLET | Freq: Every day | ORAL | Status: DC
  Administered 2023-10-12 – 2023-10-13 (×2): 325 mg via ORAL
  Filled 2023-10-11 (×2): qty 1

## 2023-10-11 NOTE — Progress Notes (Signed)
 Occupational Therapy Treatment Patient Details Name: Megan Manning MRN: [id number] DOB: 1980/11/22 Today's Date: 10/11/2023   History of present illness 43 y.o. female adm 12/23, presenting with SOB and hypoxia. PE/CHF exacerbation ruled out with normal echo, negative CTPE, bilateral LE U/S, D-Dimer, and troponin. PMHx: borderline personality, anxiety, bipolar, DM, HLD, hypothyroidism, HTN, PICA, intellectual disability   OT comments  Patient session limited as she had recently ambulated with PT, and had just returned from the bathroom where she had been able to ambulate independently, but effortful and complaining of chest pain. O2 stable on RA, and HR in the 120s in sitting. OT recommending work with theraband in order to increase strength, however will keep outside of the room due to safety concerns. OT recommendations remains appropriate, will continue to follow.       If plan is discharge home, recommend the following:  A little help with bathing/dressing/bathroom;Assist for transportation;Supervision due to cognitive status   Equipment Recommendations  None recommended by OT    Recommendations for Other Services      Precautions / Restrictions Precautions Precautions: Fall Precaution/Restrictions Comments: watch HR Restrictions Weight Bearing Restrictions Per Provider Order: No       Mobility Bed Mobility               General bed mobility comments: in recliner    Transfers Overall transfer level: Needs assistance     Sit to Stand: Contact guard assist           General transfer comment: CGA to boost in chair, did not fully come into standing     Balance Overall balance assessment: Needs assistance Sitting-balance support: No upper extremity supported, Feet supported Sitting balance-Leahy Scale: Fair         Standing balance comment: reliant on BUE support                           ADL either performed or assessed with clinical judgement    ADL Overall ADL's : Needs assistance/impaired     Grooming: Set up;Sitting           Upper Body Dressing : Supervision/safety;Sitting   Lower Body Dressing: Minimal assistance;Sitting/lateral leans;Sit to/from stand Lower Body Dressing Details (indicate cue type and reason): patient stating she usually prop sits             Functional mobility during ADLs: Contact guard assist General ADL Comments: Patient session limited as she had recently ambulated with PT, and had just returned from the bathroom where she had been able to ambulate independently, but effortful and complaining of chest pain. O2 stable on RA, and HR in the 120s in sitting. OT recommending work with theraband in order to increase strength, however will keep outside of the room due to safety concerns. OT recommendations remains appropriate, will continue to follow.    Extremity/Trunk Assessment Upper Extremity Assessment Upper Extremity Assessment: Generalized weakness            Vision       Perception     Praxis     Communication Communication Communication: Impaired Factors Affecting Communication: Reduced clarity of speech   Cognition Arousal: Alert Behavior During Therapy: WFL for tasks assessed/performed Cognition: History of cognitive impairments                               Following commands: Intact  Cueing   Cueing Techniques: Verbal cues  Exercises      Shoulder Instructions       General Comments HR in 120s, VSS on RA at 93%    Pertinent Vitals/ Pain       Pain Assessment Pain Assessment: Faces Faces Pain Scale: Hurts little more Pain Location: chest with breathing Pain Descriptors / Indicators: Grimacing, Guarding Pain Intervention(s): Limited activity within patient's tolerance, Monitored during session, Repositioned  Home Living                                          Prior Functioning/Environment               Frequency  Min 1X/week        Progress Toward Goals  OT Goals(current goals can now be found in the care plan section)  Progress towards OT goals: Progressing toward goals  Acute Rehab OT Goals Patient Stated Goal: to get better OT Goal Formulation: With patient Time For Goal Achievement: 10/21/23 Potential to Achieve Goals: Good  Plan      Co-evaluation                 AM-PAC OT "6 Clicks" Daily Activity     Outcome Measure   Help from another person eating meals?: A Little Help from another person taking care of personal grooming?: A Little Help from another person toileting, which includes using toliet, bedpan, or urinal?: A Little Help from another person bathing (including washing, rinsing, drying)?: A Lot Help from another person to put on and taking off regular upper body clothing?: A Little Help from another person to put on and taking off regular lower body clothing?: A Lot 6 Click Score: 16    End of Session    OT Visit Diagnosis: Unsteadiness on feet (R26.81)   Activity Tolerance Patient tolerated treatment well   Patient Left in chair;with call bell/phone within reach;with nursing/sitter in room   Nurse Communication Mobility status        Time: 1610-9604 OT Time Calculation (min): 9 min  Charges: OT General Charges $OT Visit: 1 Visit OT Treatments $Self Care/Home Management : 8-22 mins  Megan Manning, OTR/L Acute Rehabilitation Services [phone]   Megan Manning 10/11/2023, 3:08 PM

## 2023-10-11 NOTE — Progress Notes (Signed)
 Physical Therapy Treatment Patient Details Name: ROSALEE TOLLEY MRN: [id number] DOB: 09/18/1980 Today's Date: 10/11/2023   History of Present Illness 43 y.o. female adm 12/23, presenting with SOB and hypoxia. PE/CHF exacerbation ruled out with normal echo, negative CTPE, bilateral LE U/S, D-Dimer, and troponin. PMHx: borderline personality, anxiety, bipolar, DM, HLD, hypothyroidism, HTN, PICA, intellectual disability    PT Comments  Patient continues with limited activity tolerance and develops chest pain, HR 130 and SOB with SpO2 88-94% on 2-3L O2.  She rested then performed standing LE therex.  Seems fearful to progress though she is positive when encouraged to continue to push to walk to bathroom, etc.  She remains appropriate for inpatient rehab prior to d/c to group home.  PT will continue to follow in the acute setting.    If plan is discharge home, recommend the following: A little help with walking and/or transfers;A little help with bathing/dressing/bathroom;Assistance with cooking/housework;Assist for transportation;Help with stairs or ramp for entrance;Direct supervision/assist for financial management;Direct supervision/assist for medications management;Supervision due to cognitive status   Can travel by private vehicle     Yes  Equipment Recommendations  BSC/3in1;Wheelchair (measurements PT);Wheelchair cushion (measurements PT)    Recommendations for Other Services       Precautions / Restrictions Precautions Precautions: Fall Precaution/Restrictions Comments: watch HR     Mobility  Bed Mobility               General bed mobility comments: in recliner    Transfers Overall transfer level: Needs assistance Equipment used: Rolling walker (2 wheels) Transfers: Sit to/from Stand Sit to Stand: Min assist           General transfer comment: cues for safety, assist for balance    Ambulation/Gait Ambulation/Gait assistance: Min assist Gait Distance (Feet): 6  Feet Assistive device: Rolling walker (2 wheels) Gait Pattern/deviations: Step-to pattern, Step-through pattern, Decreased stride length, Wide base of support       General Gait Details: hesitant and stopping quickly with c/o fatigue, noted HR 130   Stairs             Wheelchair Mobility     Tilt Bed    Modified Rankin (Stroke Patients Only)       Balance Overall balance assessment: Needs assistance   Sitting balance-Leahy Scale: Fair     Standing balance support: Bilateral upper extremity supported Standing balance-Leahy Scale: Poor Standing balance comment: UE support for balance, over extends trunk at times, risk for posterior LOB                            Communication Communication Communication: Impaired Factors Affecting Communication: Reduced clarity of speech  Cognition Arousal: Alert Behavior During Therapy: WFL for tasks assessed/performed   PT - Cognitive impairments: No family/caregiver present to determine baseline                       PT - Cognition Comments: impulsive and needs cues for safety throughout, difficulty with pushing through fatigue and needing to sit quickly, frustrated at sitter who was encouraging her to walk more        Cueing    Exercises General Exercises - Lower Extremity Hip Flexion/Marching: AROM, Standing, 5 reps, Both Heel Raises: AROM, Both, 5 reps, Standing Mini-Sqauts: AROM, Both, 5 reps, Standing    General Comments General comments (skin integrity, edema, etc.): on 2-3L O2 throughout, SpO2 88-94%, HR 130 and BP  125/75 after ambulation as pt c/o chest pain (noted in chart some pleuritic pain)      Pertinent Vitals/Pain Pain Assessment Pain Assessment: Faces Faces Pain Scale: Hurts little more Pain Location: chest with breathing Pain Descriptors / Indicators: Grimacing, Guarding Pain Intervention(s): Monitored during session    Home Living                          Prior  Function            PT Goals (current goals can now be found in the care plan section) Progress towards PT goals: Progressing toward goals (slowly)    Frequency    Min 2X/week      PT Plan      Co-evaluation              AM-PAC PT "6 Clicks" Mobility   Outcome Measure  Help needed turning from your back to your side while in a flat bed without using bedrails?: A Little Help needed moving from lying on your back to sitting on the side of a flat bed without using bedrails?: A Little Help needed moving to and from a bed to a chair (including a wheelchair)?: A Little Help needed standing up from a chair using your arms (e.g., wheelchair or bedside chair)?: A Little Help needed to walk in hospital room?: Total Help needed climbing 3-5 steps with a railing? : Total 6 Click Score: 14    End of Session Equipment Utilized During Treatment: Gait belt;Oxygen Activity Tolerance: Patient limited by fatigue;Treatment limited secondary to medical complications (Comment) (tachy and symptomatic with chest pain) Patient left: in chair;with call bell/phone within reach;with nursing/sitter in room   PT Visit Diagnosis: Other abnormalities of gait and mobility (R26.89);Muscle weakness (generalized) (M62.81);Difficulty in walking, not elsewhere classified (R26.2)     Time: 7829-5621 PT Time Calculation (min) (ACUTE ONLY): 27 min  Charges:    $Gait Training: 8-22 mins $Therapeutic Activity: 8-22 mins PT General Charges $$ ACUTE PT VISIT: 1 Visit                     Sheran Lawless, PT Acute Rehabilitation Services Office:[phone] 10/11/2023    Elray Mcgregor 10/11/2023, 2:22 PM

## 2023-10-11 NOTE — Assessment & Plan Note (Addendum)
 Overall controlled with mild elevations alongside tachycardia likely associated with anxiety/movement. -Discontinue cardiac monitoring -Metoprolol XR 25 mg daily

## 2023-10-11 NOTE — Consult Note (Addendum)
 While we appreciate your input, we believe that the primary team should continue to evaluate the need for 1:1 monitoring. Psychiatric consult liaison is currently not following this patient, and unaware to gage her behaviors as would the primary team.  Currently, 1:1 supervision has been implemented due to concerns regarding self-harm behaviors, impulsivity, and overall patient safety. The patient has an intellectual developmental disorder (IDD) and exhibits poor disinhibition, which increases the likelihood that she will require continuous supervision until she can be placed outside of the hospital. At this time, the patient has not demonstrated any need for PRNs or exhibited any disruptive behaviors.  If at any point the team feels that the patient is no longer at significant risk for self-harm--evidenced by (no recent self-harm behaviors, stable mood, improved impulse control, or lack of acute distress)--we would welcome the option to step down from 1:1 to telesitter monitoring. A telesitter would still allow for continuous observation and ensure patient safety. However, given her IDD, the consistency and direct supervision provided by 1:1 monitoring likely continue to be beneficial in minimizing her self-harm behaviors and impulsivity. Please keep in mind suicide sitter and safety sitter are slightly different, and in this case the patient has a Recruitment consultant. If this is a barrier to placement recommend documenting the need for a safety sitter due to history of impulsivity and pervasive patterns of self harm.   Please feel free to add any additional considerations or recommendations based on your assessment, particularly as the patient has been in the hospital for the past three months and her needs may evolve over time.   Thank you for your consult regarding the patient's 1:1 status.  Psychiatry to sign off at this time. No charge [place]-1

## 2023-10-11 NOTE — Assessment & Plan Note (Addendum)
 Patient boarded in the ED for several months while finding placement.  Was evaluated in the ED for swallowing nonfood items.  Psychiatry is being reconsulted today for dispo planning. -Continue Zoloft 100 mg nightly, Seroquel 200 mg nightly, lamotrigine 25 mg daily for 2 weeks then increase to 50 mg daily, doxazosin 2 mg nightly -TOC following for placement -Continue one-to-one sitter for now -Psych repeat consult for dispo -Weekly BMP, CBC

## 2023-10-11 NOTE — Progress Notes (Signed)
 Report called to Megan Hashimoto, RN on 6N. Patient will be transferring to 6N25. RN called staffing office to ask if a telesitter camera had come available. Per staffing, no telesitter cameras are available. Patient remains on the list to receive a camera when one is available.

## 2023-10-11 NOTE — Progress Notes (Addendum)
 Daily Progress Note Intern Pager: [phone]  Patient name: Megan Manning Medical record number: [id number] Date of birth: [birth year] Age: 43 y.o. Gender: female  Primary Care Provider: Angelica Pou, NP Consultants: Psychiatry, Pulmonology Code Status: FULL  Pt Overview and Major Events to Date:  12/19-3/7 - Boarded in ED pending placement  3/8 - Admitted to FMTS 3/17 - Psych reconsulted for dispo assistance   Assessment and Plan: Megan Manning is a 43 y.o. female with a pertinent PMH of OHS, asthma, T2DM, GERD, and hypothyroidism admitted for AHRF 2/2 asthma exacerbation and phrenic nerve palsy, currently medically stable for discharge and awaiting dispo.  Sitter expired 2 days ago, but staffing was still Tax inspector.  Per RN, patient has not had any suicidal or impulsive behaviors.  Unclear discharge options.  Would need to be without sitter >24 hours for SNF.  Previously not meeting criteria for psych inpatient hospitalization. Assessment & Plan Self-injurious behavior Patient boarded in the ED for several months while finding placement.  Was evaluated in the ED for swallowing nonfood items.  Psychiatry is being reconsulted today for dispo planning. -Continue Zoloft 100 mg nightly, Seroquel 200 mg nightly, lamotrigine 25 mg daily for 2 weeks then increase to 50 mg daily, doxazosin 2 mg nightly -TOC following for placement -Continue one-to-one sitter for now -Psych repeat consult for dispo -Weekly BMP, CBC SOB  Asthma  OHS Respiratory status is stable, intermittently using 2 L. Awaiting Iron/ferritin. -Pulmonology available as needed, appreciate care -Anoro Ellipta 1 puff daily (not been receiving recently) -Xopenex Q6h PRN -s/p Azithromycin 50 mg daily 3/14 - 3/17 -CPAP nightly -Wean oxygen as tolerated, keep O2 88 to 92% -Denied for GLP-1 by insurance Hypertension Overall controlled with mild elevations alongside tachycardia likely associated with  anxiety/movement. -Discontinue cardiac monitoring -Metoprolol XR 25 mg daily Chronic health problem T2DM: Last A1c 5.6 five months ago, well-controlled OSA: Continue CPAP QHS GERD: Continue pantoprazole 40 mg BID Hypothyroidism: Continue levothyroxine 88 mcg daily Allergies: Continue loratadine 10 mg daily  FEN/GI: Finger foods PPx: Lovenox Dispo: SNF  pending placement .  Medically stable for discharge.  Subjective:  This morning, patient reports some chest pain, unchanged from every day prior, describes "burning" sensation.  Otherwise doing well and comfortable.  Objective: Temp:  [97.6 F (36.4 C)-99 F (37.2 C)] 97.6 F (36.4 C) (03/17 0423) Pulse Rate:  [69-107] 105 (03/17 0423) Resp:  [18-20] 18 (03/17 0423) BP: (145-154)/(20-120) 154/67 (03/17 0423) SpO2:  [89 %-99 %] 93 % (03/17 0423)  Physical Exam: General: Sitting in chair at bedside, NAD, alert and at baseline. Cardiovascular: Tachycardic and regular rhythm. Normal S1/S2. No murmurs, rubs, or gallops appreciated. 2+ radial pulses. Pulmonary: Clear bilaterally to ascultation. No increased WOB, no accessory muscle usage on room air. No wheezes, crackles, or rhonchi. Abdominal: Normoactive bowel sounds, nondistended. No tenderness to deep or light palpation. No rebound or guarding. No HSM. Skin: Warm and dry. Extremities: Trace pitting peripheral edema bilaterally.  Capillary refill <2 seconds. Psych: Denies SI/HI.  Full range affect.  Appropriate responses with normal judgement.  Laboratory: Most recent CBC Lab Results  Component Value Date   WBC 6.1 10/11/2023   HGB 10.8 (L) 10/11/2023   HCT 34.6 (L) 10/11/2023   MCV 87.6 10/11/2023   PLT 245 10/11/2023   Most recent BMP    Latest Ref Rng & Units 10/11/2023    4:20 AM  BMP  Glucose 70 - 99 mg/dL 95   BUN 6 -  20 mg/dL 6   Creatinine 9.32 - 3.55 mg/dL 7.32   Sodium 202 - 542 mmol/L 140   Potassium 3.5 - 5.1 mmol/L 4.0   Chloride 98 - 111 mmol/L 108    CO2 22 - 32 mmol/L 25   Calcium 8.9 - 10.3 mg/dL 8.5     Other pertinent labs: -None  New Imaging/Diagnostic Tests: -None  Brooks Kinnan, MD 10/11/2023, 8:00 AM  PGY-1, [facility] FPTS Intern pager: [phone], text pages welcome Secure chat group [facility] Teaching Service

## 2023-10-11 NOTE — Assessment & Plan Note (Addendum)
 Respiratory status is stable, intermittently using 2 L. Awaiting Iron/ferritin. -Pulmonology available as needed, appreciate care -Anoro Ellipta 1 puff daily (not been receiving recently) -Xopenex Q6h PRN -s/p Azithromycin 50 mg daily 3/14 - 3/17 -CPAP nightly -Wean oxygen as tolerated, keep O2 88 to 92% -Denied for GLP-1 by insurance

## 2023-10-11 NOTE — Progress Notes (Signed)
   10/11/23 2300  BiPAP/CPAP/SIPAP  $ Non-Invasive Home Ventilator  Subsequent  BiPAP/CPAP/SIPAP Pt Type Adult  BiPAP/CPAP/SIPAP Resmed  Mask Type Full face mask  Dentures removed? Not applicable  Mask Size Large  Respiratory Rate 22 breaths/min  EPAP 8 cmH2O  PEEP 8 cmH20  FiO2 (%) 28 %  Flow Rate 2 lpm  Minute Ventilation 12.6  Leak 0  Tidal Volume (Vt) 408  CPAP 8 cmH2O  Patient Home Equipment No  Auto Titrate No  Safety Check Completed by RT for Home Unit Yes, no issues noted  BiPAP/CPAP /SiPAP Vitals  Pulse Rate (!) 103  Resp (!) 22  SpO2 96 %  Bilateral Breath Sounds Clear;Diminished  MEWS Score/Color  MEWS Score 2  MEWS Score Color Yellow

## 2023-10-11 NOTE — Assessment & Plan Note (Signed)
 T2DM: Last A1c 5.6 five months ago, well-controlled OSA: Continue CPAP QHS GERD: Continue pantoprazole 40 mg BID Hypothyroidism: Continue levothyroxine 88 mcg daily Allergies: Continue loratadine 10 mg daily

## 2023-10-11 NOTE — Progress Notes (Signed)
       RE:  Megan Manning   Date of Birth:  07-Feb-1981   Date:    10/11/2023    To Whom It May Concern:  Please be advised that the above-named patient will require a short-term nursing home stay - anticipated 30 days or less for rehabilitation and strengthening.  The plan is for return home.

## 2023-10-11 NOTE — Plan of Care (Signed)
   Problem: Education: Goal: Knowledge of General Education information will improve Description Including pain rating scale, medication(s)/side effects and non-pharmacologic comfort measures Outcome: Progressing   Problem: Health Behavior/Discharge Planning: Goal: Ability to manage health-related needs will improve Outcome: Progressing

## 2023-10-11 NOTE — TOC Progression Note (Signed)
 Transition of Care [facility]) - Progression Note    Patient Details  Name: Megan Manning MRN: [id number] Date of Birth: [birth year]  [facility] CM/SW Contact  Erin Sons, [place] Phone Number: 10/11/2023, 3:31 PM  Clinical Narrative:     Pt has no bed offers; [facility] is "considering." Will follow up with [facility] for decision. Documents uploaded into [facility] for PASRR. PASRR still pending.   Expected Discharge Plan: Group Home Barriers to Discharge: Insurance Authorization  Expected Discharge Plan and Services                                               Social Determinants of Health (SDOH) Interventions SDOH Screenings   Food Insecurity: No Food Insecurity (10/04/2023)  Housing: Low Risk  (10/04/2023)  Transportation Needs: No Transportation Needs (10/04/2023)  Utilities: Not At Risk (10/04/2023)  Financial Resource Strain: Low Risk  (09/02/2022)   Received from [facility]  Social Connections: Unknown (12/09/2021)   Received from [facility]  Stress: No Stress Concern Present (04/15/2023)   Received from [facility]  Recent Concern: Stress - Stress Concern Present (04/05/2023)   Received from [facility]  Tobacco Use: Low Risk  (07/19/2023)    Readmission Risk Interventions     No data to display

## 2023-10-11 NOTE — Telephone Encounter (Signed)
 Patient Product/process development scientist completed.    The patient is insured through [employer] [place].     Ran test claim for Wegovy 0.25 and Requires Prior Authorization   This test claim was processed through [facility]- copay amounts may vary at other pharmacies due to [employer], or as the patient moves through the different stages of their insurance plan.     Roland Earl, CPHT Pharmacy Technician III Certified Patient Advocate [facility] Pharmacy Patient Advocate Team Direct Number: [phone]  Fax: [phone]

## 2023-10-11 NOTE — Telephone Encounter (Signed)
 Pharmacy Patient Advocate Encounter  Received notification from [facility] that Prior Authorization for Ozempic (0.25 or 0.5 MG/DOSE) 2MG /3ML pen-injectors  has been DENIED.  Full denial letter will be uploaded to the media tab. See denial reason below.   PA #/Case ID/Reference #: [id number]

## 2023-10-12 ENCOUNTER — Other Ambulatory Visit (HOSPITAL_COMMUNITY): Payer: Self-pay

## 2023-10-12 ENCOUNTER — Telehealth (HOSPITAL_COMMUNITY): Payer: Self-pay | Admitting: Pharmacy Technician

## 2023-10-12 DIAGNOSIS — R0902 Hypoxemia: Secondary | ICD-10-CM | POA: Diagnosis not present

## 2023-10-12 NOTE — Assessment & Plan Note (Addendum)
 Patient boarded in the ED for several months while finding placement.  Was evaluated in the ED for swallowing nonfood items.  Psychiatry is being reconsulted today for dispo planning. -Continue Zoloft 100 mg nightly, Seroquel 200 mg nightly, lamotrigine 25 mg daily for 2 weeks then increase to 50 mg daily, doxazosin 2 mg nightly -TOC following for placement -Reaching out to PT and OT to clarify SNF placement -Discontinue 1:1 sitter, monitor closely for inappropriate behaviors -Weekly BMP, CBC

## 2023-10-12 NOTE — Assessment & Plan Note (Addendum)
 Respiratory status is stable, intermittently using 2 L. -Pulmonology available as needed, appreciate care -Anoro Ellipta 1 puff daily (not been receiving recently) -Xopenex Q6h PRN given tachycardia -s/p Azithromycin 50 mg daily 3/14-3/17 -CPAP nightly -Wean oxygen as tolerated to room air, keep O2 88 to 92% -Will need ambulatory pulse ox prior to d/c -Approved for [facility] starting dose with $0 copay

## 2023-10-12 NOTE — Progress Notes (Addendum)
 Daily Progress Note Intern Pager: [phone]  Patient name: Megan Manning Medical record number: [id number] Date of birth: 06-02-1981 Age: 43 y.o. Gender: female  Primary Care Provider: Angelica Pou, NP Consultants: Psychiatry, Pulmonology Code Status: FULL  Pt Overview and Major Events to Date: 12/19-3/7 - Boarded in ED pending placement  3/8 - Admitted to FMTS 3/17 - Psych reconsulted for dispo assistance, sitter per primary team  Assessment and Plan: KIMA MALENFANT is a 43 y.o. female with a pertinent PMH of developmental delay, OHS, asthma, T2DM, GERD, hypothyroidism who was admitted after boarding in ED for several weeks for sudden onset AHRF 2/2 asthma exacerbation and phrenic nerve palsy, currently medically stable for discharge and awaiting placement.   Per Psychiatry, it is up to primary team to determine sitter plan.  Not meeting psych inpatient hospitalization criteria.  Solely impulsive behaviors in setting of developmental delay. Will not proceed with 1:1 sitter for now given that patient has no reported inappropriate behaviors inpatient. Suicide tray recommended and ordered.   Concern that SNF discharge may not be a good option, but will coordinate PT and OT to clarify recommendation.  May be able to discharge to group home pending recs. Assessment & Plan Self-injurious behavior Patient boarded in the ED for several months while finding placement.  Was evaluated in the ED for swallowing nonfood items.  Psychiatry is being reconsulted today for dispo planning. -Continue Zoloft 100 mg nightly, Seroquel 200 mg nightly, lamotrigine 25 mg daily for 2 weeks then increase to 50 mg daily, doxazosin 2 mg nightly -TOC following for placement -Reaching out to PT and OT to clarify SNF placement -Discontinue 1:1 sitter, monitor closely for inappropriate behaviors -Weekly BMP, CBC SOB  Asthma  OHS Respiratory status is stable, intermittently using 2 L. -Pulmonology available as  needed, appreciate care -Anoro Ellipta 1 puff daily (not been receiving recently) -Xopenex Q6h PRN given tachycardia -s/p Azithromycin 50 mg daily 3/14-3/17 -CPAP nightly -Wean oxygen as tolerated to room air, keep O2 88 to 92% -Will need ambulatory pulse ox prior to d/c -Approved for [facility] starting dose with $0 copay Hypertension Overall controlled with mild elevations alongside tachycardia likely associated with anxiety/movement. -Discontinue cardiac monitoring -Metoprolol XR 25 mg daily Chronic health problem T2DM: Last A1c 6.0, well-controlled OSA: Continue CPAP QHS GERD: Continue pantoprazole 40 mg BID Hypothyroidism: Continue levothyroxine 88 mcg daily Allergies: Continue loratadine 10 mg daily  FEN/GI: Finger foods PPx: Lovenox Dispo: SNF versus group home pending placement. Difficulty with placement.  Subjective:  This morning, patient states she is doing "so-so."  Endorses some dyspnea with exertion, no chest pain, otherwise comfortable. RN confirms patient has not had sitter overnight and does not have one today.  Of note, received Secure Chat 3/17 PM stating no telesitter available and ordered 1:1 in-person sitter at that time.  Objective: Temp:  [98.5 F (36.9 C)-100.2 F (37.9 C)] 100.2 F (37.9 C) (03/18 0423) Pulse Rate:  [100-119] 100 (03/18 0423) Resp:  [18-22] 20 (03/18 0423) BP: (129-151)/(79-129) 146/129 (03/18 0423) SpO2:  [91 %-97 %] 96 % (03/18 0423) FiO2 (%):  [28 %] 28 % (03/17 2300)  Physical Exam: General: Resting comfortably in bed, NAD, alert and at baseline. Cardiovascular: Tachycardic rate and regular rhythm. Normal S1/S2. No murmurs, rubs, or gallops appreciated. 2+ radial pulses. Pulmonary: Clear bilaterally to ascultation. No wheezes, crackles, or rhonchi. No increased WOB, no accessory muscle usage on 2L [place]. Abdominal: Normoactive bowel sounds, nondistended. No tenderness to deep  or light palpation. No rebound or guarding. Extremities:  Trace peripheral edema bilaterally. Capillary refill <2 seconds. Psych: Pleasant. Denies SI/HI. Appropriate responses to questions but limited insight.  Laboratory: Most recent CBC Lab Results  Component Value Date   WBC 6.1 10/11/2023   HGB 10.8 (L) 10/11/2023   HCT 34.6 (L) 10/11/2023   MCV 87.6 10/11/2023   PLT 245 10/11/2023   Most recent BMP    Latest Ref Rng & Units 10/11/2023    4:20 AM  BMP  Glucose 70 - 99 mg/dL 95   BUN 6 - 20 mg/dL 6   Creatinine 4.09 - 8.11 mg/dL 9.14   Sodium 782 - 956 mmol/L 140   Potassium 3.5 - 5.1 mmol/L 4.0   Chloride 98 - 111 mmol/L 108   CO2 22 - 32 mmol/L 25   Calcium 8.9 - 10.3 mg/dL 8.5     Other pertinent labs: -None  New Imaging/Diagnostic Tests: -None  Nazier Neyhart, MD 10/12/2023, 7:56 AM  PGY-1, [facility] FPTS Intern pager: [phone], text pages welcome Secure chat group [facility] Teaching Service

## 2023-10-12 NOTE — Telephone Encounter (Signed)
 Pharmacy Patient Advocate Encounter  Received notification from [facility] that Prior Authorization for [facility] 0.25MG /0.5ML auto-injectors  has been APPROVED from 10/12/2023 to 04/13/2024. Ran test claim, Copay is $0.00. This test claim was processed through [facility]- copay amounts may vary at other pharmacies due to pharmacy/plan contracts, or as the patient moves through the different stages of their insurance plan.   PA #/Case ID/Reference #: [id number]

## 2023-10-12 NOTE — Assessment & Plan Note (Addendum)
 T2DM: Last A1c 6.0, well-controlled OSA: Continue CPAP QHS GERD: Continue pantoprazole 40 mg BID Hypothyroidism: Continue levothyroxine 88 mcg daily Allergies: Continue loratadine 10 mg daily

## 2023-10-12 NOTE — Progress Notes (Signed)
 Occupational Therapy Treatment Patient Details Name: Megan Manning MRN: [id number] DOB: 15-Jun-1981 Today's Date: 10/12/2023   History of present illness 43 y.o. female adm 12/23, presenting with SOB and hypoxia. PE/CHF exacerbation ruled out with normal echo, negative CTPE, bilateral LE U/S, D-Dimer, and troponin. PMHx: borderline personality, anxiety, bipolar, DM, HLD, hypothyroidism, HTN, PICA, intellectual disability   OT comments  Pt states she is doing so/so, when asked not able to state why she feels off, does not recall reason for admission. Pt able to fully participate and follow commands as needed, HR high during session above 115, up to 140 during 5 foot ambulation on room air. O2 dropped to 88 after a few feet, able to recover to 93 on RA after a minute. Same outcome with further attempts to ambulate, had to have chair follow for safety. Pt reports seeing stars and dizzy when ambulating. Pt reports she has stairs to get to her bedroom. At this time recommending postacute rehab <3hrs/day due to significant decreased activity tolerance, fall risk, and increased need for assistance with ADLs. Will continue to see Pt acutely to progress as able.       If plan is discharge home, recommend the following:  A little help with bathing/dressing/bathroom;Assist for transportation;Supervision due to cognitive status   Equipment Recommendations  None recommended by OT    Recommendations for Other Services      Precautions / Restrictions Precautions Precautions: Fall Recall of Precautions/Restrictions: Intact Precaution/Restrictions Comments: watch HR Restrictions Weight Bearing Restrictions Per Provider Order: No       Mobility Bed Mobility               General bed mobility comments: in recliner    Transfers Overall transfer level: Needs assistance Equipment used: Rolling walker (2 wheels) Transfers: Sit to/from Stand, Bed to chair/wheelchair/BSC Sit to Stand: Contact  guard assist     Step pivot transfers: Contact guard assist     General transfer comment: CGA for safety poor activity tolerance, chair follow needed     Balance Overall balance assessment: Needs assistance Sitting-balance support: No upper extremity supported, Feet supported Sitting balance-Leahy Scale: Fair     Standing balance support: No upper extremity supported, During functional activity Standing balance-Leahy Scale: Poor Standing balance comment: reliant on BUE support                           ADL either performed or assessed with clinical judgement   ADL Overall ADL's : Needs assistance/impaired Eating/Feeding: Set up;Sitting               Upper Body Dressing : Supervision/safety;Sitting   Lower Body Dressing: Moderate assistance;Sit to/from stand   Toilet Transfer: Contact guard assist;Rolling walker (2 wheels);BSC/3in1             General ADL Comments: Pt requires assistance for LB ADLs at baseline to don/doff socks, wears slip ons. Pt has good BUE strength/ROM to complete UB ADLs.    Extremity/Trunk Assessment Upper Extremity Assessment Upper Extremity Assessment: Overall WFL for tasks assessed            Vision       Perception     Praxis     Communication Communication Communication: Impaired Factors Affecting Communication: Reduced clarity of speech   Cognition Arousal: Alert Behavior During Therapy: WFL for tasks assessed/performed Cognition: History of cognitive impairments  Following commands: Intact        Cueing   Cueing Techniques: Verbal cues  Exercises      Shoulder Instructions       General Comments      Pertinent Vitals/ Pain       Pain Assessment Pain Assessment: No/denies pain  Home Living                                          Prior Functioning/Environment              Frequency  Min 1X/week        Progress Toward  Goals  OT Goals(current goals can now be found in the care plan section)  Progress towards OT goals: Progressing toward goals  Acute Rehab OT Goals Patient Stated Goal: to improve activity tolerance OT Goal Formulation: With patient Time For Goal Achievement: 10/21/23 Potential to Achieve Goals: Good ADL Goals Pt Will Perform Grooming: with modified independence;standing Pt Will Perform Lower Body Dressing: with modified independence;sit to/from stand Pt Will Transfer to Toilet: with modified independence;regular height toilet;ambulating Pt/caregiver will Perform Home Exercise Program: Increased strength;Both right and left upper extremity;With Supervision  Plan      Co-evaluation                 AM-PAC OT "6 Clicks" Daily Activity     Outcome Measure   Help from another person eating meals?: None Help from another person taking care of personal grooming?: A Little Help from another person toileting, which includes using toliet, bedpan, or urinal?: A Little Help from another person bathing (including washing, rinsing, drying)?: A Lot Help from another person to put on and taking off regular upper body clothing?: A Little Help from another person to put on and taking off regular lower body clothing?: A Lot 6 Click Score: 17    End of Session Equipment Utilized During Treatment: Gait belt;Rolling walker (2 wheels);Oxygen  OT Visit Diagnosis: Unsteadiness on feet (R26.81)   Activity Tolerance Patient tolerated treatment well   Patient Left in chair;with call bell/phone within reach;with chair alarm set   Nurse Communication Mobility status        Time: 0865-7846 OT Time Calculation (min): 17 min  Charges: OT General Charges $OT Visit: 1 Visit OT Treatments $Therapeutic Activity: 8-22 mins  [place], OTR/L   Alexis Goodell 10/12/2023, 1:53 PM

## 2023-10-12 NOTE — Plan of Care (Signed)

## 2023-10-12 NOTE — TOC Progression Note (Addendum)
 Transition of Care [facility]) - Progression Note    Patient Details  Name: Megan Manning MRN: [id number] Date of Birth: 10-26-1980  [facility]) CM/SW Contact  Erin Sons, [place] Phone Number: 10/12/2023, 10:40 AM  Clinical Narrative:     [facility] cannot offer bed. No other bed offers. CSW extended SNF bed search.   CSW left voicemail with [facility] contact, [phone], requesting return call.  Expected Discharge Plan:  (SNF vs Group home) Barriers to Discharge: SNF Pending bed offer          Social Determinants of Health (SDOH) Interventions SDOH Screenings   Food Insecurity: No Food Insecurity (10/04/2023)  Housing: Low Risk  (10/04/2023)  Transportation Needs: No Transportation Needs (10/04/2023)  Utilities: Not At Risk (10/04/2023)  Financial Resource Strain: Low Risk  (09/02/2022)   Received from [facility]  Social Connections: Unknown (12/09/2021)   Received from [facility]  Stress: No Stress Concern Present (04/15/2023)   Received from [facility]  Recent Concern: Stress - Stress Concern Present (04/05/2023)   Received from [facility]  Tobacco Use: Low Risk  (07/19/2023)    Readmission Risk Interventions     No data to display

## 2023-10-12 NOTE — Telephone Encounter (Signed)
 Pharmacy Patient Advocate Encounter   Received notification that prior authorization for [facility] 0.25MG /0.5ML auto-injectors is required/requested.   Insurance verification completed.   The patient is insured through [facility] .   Per test claim: PA required; PA submitted to above mentioned insurance via CoverMyMeds Key/confirmation #/EOC [id number] Status is pending

## 2023-10-12 NOTE — Assessment & Plan Note (Addendum)
 Overall controlled with mild elevations alongside tachycardia likely associated with anxiety/movement. -Discontinue cardiac monitoring -Metoprolol XR 25 mg daily

## 2023-10-12 NOTE — Progress Notes (Signed)
 Mobility Specialist Progress Note:   10/12/23 1230  Mobility  Activity Transferred from bed to chair;Ambulated with assistance in room  Level of Assistance Contact guard assist, steadying assist  Assistive Device Front wheel walker  Distance Ambulated (ft) 15 ft  Activity Response Tolerated well  Mobility Referral Yes  Mobility visit 1 Mobility  Mobility Specialist Start Time (ACUTE ONLY) 1130  Mobility Specialist Stop Time (ACUTE ONLY) 1155  Mobility Specialist Time Calculation (min) (ACUTE ONLY) 25 min   Pre Mobility: 120 HR , 82% SpO2 RA During Mobility: 115-140 HR , 82% -94% SpO2 RA- 2 L Post Mobility: 113 HR , 94% SpO2 2 L  Pt received in bed, agreeable to mobility. SB bed mobility. MinG during ambulation. Pt desat to 82% on RA during ambulation. Pt needing 2 L to keep sats above 89%. Pt c/o SOB and slight dizziness during session. HR peaked at 140 bpm during ambulation. Pt left in chair with call bell in reach and all needs met. RN notified. Chair alarm on.   Leory Plowman  Mobility Specialist Please contact via [employer] Rehab office at [phone]

## 2023-10-12 NOTE — Progress Notes (Signed)
 Nurse requested Mobility Specialist to perform oxygen saturation test with pt which includes removing pt from oxygen both at rest and while ambulating.  Below are the results from that testing.     Patient Saturations on Room Air at Rest = spO2 82%  Patient Saturations on Room Air while Ambulating = sp02 82% .  Rested and performed pursed lip breathing for 1 minute with sp02 at 85%.  Patient Saturations on 2 Liters of oxygen while Ambulating = sp02 94%  At end of testing pt left in room on 2 Liters of oxygen.  Reported results to nurse.

## 2023-10-12 NOTE — Progress Notes (Signed)
 MEWS Progress Note  Patient Details Name: Megan Manning MRN: [id number] DOB: [birth year] Today's Date: 10/12/2023   MEWS Flowsheet Documentation:  Assess: MEWS Score Temp: 97.8 F (36.6 C) BP: (!) 138/54 MAP (mmHg): 79 Pulse Rate: (!) 109 ECG Heart Rate: (!) 102 Resp: 20 Level of Consciousness: Alert SpO2: 93 % O2 Device: Nasal Cannula Patient Activity (if Appropriate): In chair O2 Flow Rate (L/min): 2 L/min FiO2 (%): 28 % Assess: MEWS Score MEWS Temp: 0 MEWS Systolic: 0 MEWS Pulse: 1 MEWS RR: 0 MEWS LOC: 0 MEWS Score: 1 MEWS Score Color: Green Assess: SIRS CRITERIA SIRS Temperature : 0 SIRS Respirations : 0 SIRS Pulse: 1 SIRS WBC: 0 SIRS Score Sum : 1 SIRS Temperature : 0 SIRS Pulse: 1 SIRS Respirations : 0 SIRS WBC: 0 SIRS Score Sum : 1 Assess: if the MEWS score is Yellow or Red Were vital signs accurate and taken at a resting state?: Yes Does the patient meet 2 or more of the SIRS criteria?: No MEWS guidelines implemented : Yes, yellow Treat MEWS Interventions: Considered administering scheduled or prn medications/treatments as ordered Take Vital Signs Increase Vital Sign Frequency : Yellow: Q2hr x1, continue Q4hrs until patient remains green for 12hrs Escalate MEWS: Escalate: Yellow: Discuss with charge nurse and consider notifying provider and/or RRT        [place] 10/12/2023, 10:23 AM

## 2023-10-13 ENCOUNTER — Other Ambulatory Visit (HOSPITAL_COMMUNITY): Payer: Self-pay

## 2023-10-13 DIAGNOSIS — R0902 Hypoxemia: Secondary | ICD-10-CM | POA: Diagnosis not present

## 2023-10-13 MED ORDER — ALUM & MAG HYDROXIDE-SIMETH 400-400-40 MG/5ML PO SUSP: 30.0000 mL | Freq: Three times a day (TID) | ORAL | 0 refills | Status: AC | PRN

## 2023-10-13 MED ORDER — ACETAMINOPHEN 500 MG PO TABS
1000.0000 mg | ORAL_TABLET | Freq: Three times a day (TID) | ORAL | 0 refills | Status: DC | PRN
  Filled 2023-10-13: qty 30, 5d supply, fill #0

## 2023-10-13 MED ORDER — LEVOTHYROXINE SODIUM 88 MCG PO TABS: 88.0000 ug | ORAL_TABLET | Freq: Every day | ORAL | 0 refills | Status: AC

## 2023-10-13 MED ORDER — UMECLIDINIUM-VILANTEROL 62.5-25 MCG/ACT IN AEPB: 1.0000 | INHALATION_SPRAY | Freq: Every day | RESPIRATORY_TRACT | 0 refills | Status: AC

## 2023-10-13 MED ORDER — HYDROCORTISONE 1 % EX CREA
1.0000 | TOPICAL_CREAM | Freq: Two times a day (BID) | CUTANEOUS | 0 refills | Status: DC | PRN
  Filled 2023-10-13: qty 28, 9d supply, fill #0

## 2023-10-13 MED ORDER — ANTACID ULTRA STRENGTH 1000-200 MG PO CHEW: 0.5000 | CHEWABLE_TABLET | Freq: Every day | ORAL | Status: AC

## 2023-10-13 MED ORDER — QUETIAPINE FUMARATE 200 MG PO TABS: 200.0000 mg | ORAL_TABLET | Freq: Every day | ORAL | 0 refills | Status: AC

## 2023-10-13 MED ORDER — SENNA 8.6 MG PO TABS: 1.0000 | ORAL_TABLET | Freq: Every day | ORAL | Status: DC

## 2023-10-13 MED ORDER — CETIRIZINE HCL 10 MG PO TABS: 10.0000 mg | ORAL_TABLET | Freq: Every day | ORAL | 0 refills | Status: AC

## 2023-10-13 MED ORDER — METOPROLOL SUCCINATE ER 25 MG PO TB24
25.0000 mg | ORAL_TABLET | Freq: Every day | ORAL | 0 refills | Status: DC
  Filled 2023-10-13: qty 30, 30d supply, fill #0

## 2023-10-13 MED ORDER — PANTOPRAZOLE SODIUM 40 MG PO TBEC: 40.0000 mg | DELAYED_RELEASE_TABLET | Freq: Two times a day (BID) | ORAL | 0 refills | Status: AC

## 2023-10-13 MED ORDER — POLYETHYLENE GLYCOL 3350 17 GM/SCOOP PO POWD
17.0000 g | Freq: Two times a day (BID) | ORAL | Status: AC
Start: 2023-10-13 — End: ?

## 2023-10-13 MED ORDER — SENNA 8.6 MG PO TABS
1.0000 | ORAL_TABLET | Freq: Every day | ORAL | 0 refills | Status: DC
  Filled 2023-10-13: qty 120, 120d supply, fill #0

## 2023-10-13 MED ORDER — ANTACID ULTRA STRENGTH 1000-200 MG PO CHEW
0.5000 | CHEWABLE_TABLET | Freq: Every day | ORAL | 0 refills | Status: DC
  Filled 2023-10-13: qty 15, fill #0

## 2023-10-13 MED ORDER — SENNA 8.6 MG PO TABS: 1.0000 | ORAL_TABLET | Freq: Every day | ORAL | Status: AC

## 2023-10-13 MED ORDER — IBUPROFEN 400 MG PO TABS: 400.0000 mg | ORAL_TABLET | Freq: Four times a day (QID) | ORAL | Status: DC | PRN

## 2023-10-13 MED ORDER — QUETIAPINE FUMARATE 200 MG PO TABS
200.0000 mg | ORAL_TABLET | Freq: Every day | ORAL | 0 refills | Status: DC
  Filled 2023-10-13: qty 30, 30d supply, fill #0

## 2023-10-13 MED ORDER — FERROUS SULFATE 325 (65 FE) MG PO TABS
325.0000 mg | ORAL_TABLET | Freq: Every day | ORAL | 0 refills | Status: DC
  Filled 2023-10-13: qty 30, 30d supply, fill #0

## 2023-10-13 MED ORDER — POLYETHYLENE GLYCOL 3350 17 GM/SCOOP PO POWD
17.0000 g | Freq: Two times a day (BID) | ORAL | 0 refills | Status: DC
  Filled 2023-10-13: qty 238, 7d supply, fill #0

## 2023-10-13 MED ORDER — LEVOTHYROXINE SODIUM 88 MCG PO TABS
88.0000 ug | ORAL_TABLET | Freq: Every day | ORAL | 0 refills | Status: DC
  Filled 2023-10-13: qty 30, 30d supply, fill #0

## 2023-10-13 MED ORDER — METOPROLOL SUCCINATE ER 25 MG PO TB24: 25.0000 mg | ORAL_TABLET | Freq: Every day | ORAL | 0 refills | Status: AC

## 2023-10-13 MED ORDER — SERTRALINE HCL 100 MG PO TABS: 100.0000 mg | ORAL_TABLET | Freq: Every day | ORAL | 0 refills | Status: AC

## 2023-10-13 MED ORDER — MEDROXYPROGESTERONE ACETATE 150 MG/ML IM SUSP
150.0000 mg | INTRAMUSCULAR | 0 refills | Status: DC
  Filled 2023-10-13: qty 1, 90d supply, fill #0

## 2023-10-13 MED ORDER — MEDROXYPROGESTERONE ACETATE 150 MG/ML IM SUSP: 150.0000 mg | INTRAMUSCULAR | 0 refills | Status: AC

## 2023-10-13 MED ORDER — HYDROCORTISONE 1 % EX CREA: 1.0000 | TOPICAL_CREAM | Freq: Two times a day (BID) | CUTANEOUS | Status: AC | PRN

## 2023-10-13 MED ORDER — CETIRIZINE HCL 10 MG PO TABS
10.0000 mg | ORAL_TABLET | Freq: Every day | ORAL | 0 refills | Status: DC
Start: 2023-10-13 — End: 2023-10-13
  Filled 2023-10-13: qty 30, 30d supply, fill #0

## 2023-10-13 MED ORDER — DICYCLOMINE HCL 10 MG PO CAPS
10.0000 mg | ORAL_CAPSULE | Freq: Three times a day (TID) | ORAL | 0 refills | Status: DC | PRN
  Filled 2023-10-13: qty 60, 20d supply, fill #0

## 2023-10-13 MED ORDER — LAMOTRIGINE 25 MG PO TABS
ORAL_TABLET | ORAL | 0 refills | Status: DC
  Filled 2023-10-13: qty 90, 30d supply, fill #0

## 2023-10-13 MED ORDER — ALBUTEROL SULFATE HFA 108 (90 BASE) MCG/ACT IN AERS
2.0000 | INHALATION_SPRAY | Freq: Four times a day (QID) | RESPIRATORY_TRACT | 0 refills | Status: DC | PRN
  Filled 2023-10-13: qty 18, 25d supply, fill #0

## 2023-10-13 MED ORDER — LAMOTRIGINE 25 MG PO TABS: ORAL_TABLET | ORAL | 0 refills | Status: AC

## 2023-10-13 MED ORDER — ALUM & MAG HYDROXIDE-SIMETH 400-400-40 MG/5ML PO SUSP
30.0000 mL | Freq: Three times a day (TID) | ORAL | 0 refills | Status: DC | PRN
Start: 2023-10-13 — End: 2023-10-13
  Filled 2023-10-13: qty 355, 4d supply, fill #0

## 2023-10-13 MED ORDER — DICYCLOMINE HCL 10 MG PO CAPS: 10.0000 mg | ORAL_CAPSULE | Freq: Three times a day (TID) | ORAL | 0 refills | Status: AC | PRN

## 2023-10-13 MED ORDER — SERTRALINE HCL 100 MG PO TABS: 100.0000 mg | ORAL_TABLET | Freq: Every day | ORAL | 0 refills | Status: DC

## 2023-10-13 MED ORDER — DICLOFENAC SODIUM 1 % EX GEL: 4.0000 g | Freq: Three times a day (TID) | CUTANEOUS | Status: AC | PRN

## 2023-10-13 MED ORDER — LAMOTRIGINE 25 MG PO TABS: ORAL_TABLET | ORAL | 0 refills | Status: DC

## 2023-10-13 MED ORDER — DOXAZOSIN MESYLATE 2 MG PO TABS
2.0000 mg | ORAL_TABLET | Freq: Every day | ORAL | 0 refills | Status: DC
  Filled 2023-10-13: qty 30, 30d supply, fill #0

## 2023-10-13 MED ORDER — DOXAZOSIN MESYLATE 2 MG PO TABS: 2.0000 mg | ORAL_TABLET | Freq: Every day | ORAL | 0 refills | Status: AC

## 2023-10-13 MED ORDER — FERROUS SULFATE 325 (65 FE) MG PO TABS: 325.0000 mg | ORAL_TABLET | Freq: Every day | ORAL | 0 refills | Status: AC

## 2023-10-13 MED ORDER — FERROUS SULFATE 325 (65 FE) MG PO TABS: 325.0000 mg | ORAL_TABLET | Freq: Every day | ORAL | 0 refills | Status: DC

## 2023-10-13 MED ORDER — POLYETHYLENE GLYCOL 3350 17 GM/SCOOP PO POWD: 17.0000 g | Freq: Two times a day (BID) | ORAL | Status: DC

## 2023-10-13 MED ORDER — PANTOPRAZOLE SODIUM 40 MG PO TBEC
40.0000 mg | DELAYED_RELEASE_TABLET | Freq: Two times a day (BID) | ORAL | 0 refills | Status: DC
  Filled 2023-10-13: qty 60, 30d supply, fill #0

## 2023-10-13 MED ORDER — WEGOVY 0.25 MG/0.5ML ~~LOC~~ SOAJ: 0.2500 mg | SUBCUTANEOUS | 0 refills | Status: AC

## 2023-10-13 MED ORDER — IBUPROFEN 400 MG PO TABS: 400.0000 mg | ORAL_TABLET | Freq: Four times a day (QID) | ORAL | Status: AC | PRN

## 2023-10-13 MED ORDER — IBUPROFEN 400 MG PO TABS
400.0000 mg | ORAL_TABLET | Freq: Four times a day (QID) | ORAL | 0 refills | Status: DC | PRN
  Filled 2023-10-13: qty 30, 8d supply, fill #0

## 2023-10-13 MED ORDER — METOPROLOL SUCCINATE ER 25 MG PO TB24: 25.0000 mg | ORAL_TABLET | Freq: Every day | ORAL | 0 refills | Status: DC

## 2023-10-13 MED ORDER — WEGOVY 0.25 MG/0.5ML ~~LOC~~ SOAJ
0.2500 mg | SUBCUTANEOUS | 0 refills | Status: DC
  Filled 2023-10-13: qty 2, 28d supply, fill #0

## 2023-10-13 MED ORDER — SERTRALINE HCL 100 MG PO TABS
100.0000 mg | ORAL_TABLET | Freq: Every day | ORAL | 0 refills | Status: DC
  Filled 2023-10-13: qty 30, 30d supply, fill #0

## 2023-10-13 MED ORDER — DICLOFENAC SODIUM 1 % EX GEL
4.0000 g | Freq: Three times a day (TID) | CUTANEOUS | 0 refills | Status: DC | PRN
  Filled 2023-10-13: qty 50, 5d supply, fill #0

## 2023-10-13 MED ORDER — UMECLIDINIUM-VILANTEROL 62.5-25 MCG/ACT IN AEPB
1.0000 | INHALATION_SPRAY | Freq: Every day | RESPIRATORY_TRACT | 0 refills | Status: DC
  Filled 2023-10-13: qty 60, 30d supply, fill #0

## 2023-10-13 MED ORDER — WEGOVY 0.25 MG/0.5ML ~~LOC~~ SOAJ
0.2500 mg | SUBCUTANEOUS | 0 refills | Status: DC
  Filled 2023-10-13 (×2): qty 2, 28d supply, fill #0

## 2023-10-13 MED ORDER — ALBUTEROL SULFATE HFA 108 (90 BASE) MCG/ACT IN AERS
2.0000 | INHALATION_SPRAY | Freq: Four times a day (QID) | RESPIRATORY_TRACT | 0 refills | Status: AC | PRN
Start: 2023-10-13 — End: ?

## 2023-10-13 MED ORDER — ACETAMINOPHEN 500 MG PO TABS: 1000.0000 mg | ORAL_TABLET | Freq: Three times a day (TID) | ORAL | 0 refills | Status: AC | PRN

## 2023-10-13 NOTE — Assessment & Plan Note (Deleted)
 T2DM: Last A1c 6.0, well-controlled OSA: Continue CPAP QHS GERD: Continue pantoprazole 40 mg BID Hypothyroidism: Continue levothyroxine 88 mcg daily Allergies: Continue loratadine 10 mg daily

## 2023-10-13 NOTE — Assessment & Plan Note (Deleted)
 Overall controlled with mild elevations alongside tachycardia likely associated with anxiety/movement. -Discontinue cardiac monitoring -Metoprolol XR 25 mg daily

## 2023-10-13 NOTE — Assessment & Plan Note (Deleted)
 Patient boarded in the ED for several months while finding placement.  Was evaluated in the ED for swallowing nonfood items.  No repeat behavior inpatient, now doing well without sitter. -Continue Zoloft 100 mg nightly, Seroquel 200 mg nightly, lamotrigine 25 mg daily for 2 weeks then increase to 50 mg daily, doxazosin 2 mg nightly -TOC following for placement -Reaching out to PT and OT to clarify SNF placement -Discontinue 1:1 sitter, monitor closely for inappropriate behaviors -Weekly BMP, CBC

## 2023-10-13 NOTE — Assessment & Plan Note (Deleted)
 Respiratory status is stable, now weaned to 1.5 L. -Pulmonology available as needed, appreciate care -Anoro Ellipta 1 puff daily (not been receiving recently) -Xopenex Q6h PRN given tachycardia -s/p Azithromycin 50 mg daily 3/14-3/17 -CPAP nightly -Wean oxygen as tolerated to room air, keep O2 88 to 92% -Will need ambulatory pulse ox prior to d/c -Approved for [facility] starting dose with $0 copay

## 2023-10-13 NOTE — TOC CM/SW Note (Addendum)
 Was asked to order home oxygen , 3 in 1, walker and wheelchair and have delivered to hospital room by 1300 today . Home address will be [address], [place] , [place].   Asked attending team for orders. Called Jermaine with [employer]   [employer] delivered all DME to room as requested.   Group home staff at bedside to transport patient. They did bring a SUV but they had to pick up all patient's personnel belongings and do not have room for DME. They can take portable oxygen tank, and walker but asking if [employer] can come back and pick up 3 in 1 and wheelchair.   Spoke to Watson with [employer] , home oxygen concentrator will be delivered to home today, however , wheelchair and 3 in 1 will not , may be a day or 2. Different departments. Patient and group home staff aware and are OK with wheelchair and 3 in 1 being delivered at a later date   Requesting home CPAP. Jermaine with [employer] reviewed patient's chart. She will need a outpatient sleep study before she can get a home CPAP. Attending team and SW aware

## 2023-10-13 NOTE — Plan of Care (Signed)
°  Problem: Education: °Goal: Knowledge of General Education information will improve °Description: Including pain rating scale, medication(s)/side effects and non-pharmacologic comfort measures °Outcome: Progressing °  °Problem: Health Behavior/Discharge Planning: °Goal: Ability to manage health-related needs will improve °Outcome: Progressing °  °Problem: Clinical Measurements: °Goal: Ability to maintain clinical measurements within normal limits will improve °Outcome: Progressing °Goal: Respiratory complications will improve °Outcome: Progressing °Goal: Cardiovascular complication will be avoided °Outcome: Progressing °  °Problem: Activity: °Goal: Risk for activity intolerance will decrease °Outcome: Progressing °  °Problem: Nutrition: °Goal: Adequate nutrition will be maintained °Outcome: Progressing °  °

## 2023-10-13 NOTE — Progress Notes (Signed)
 Mobility Specialist Progress Note:   10/13/23 1041  Mobility  Activity Ambulated with assistance in room  Level of Assistance  (MinG)  Assistive Device Front wheel walker  Distance Ambulated (ft) 2 ft  Activity Response Tolerated fair  Mobility Referral Yes  Mobility visit 1 Mobility  Mobility Specialist Start Time (ACUTE ONLY) 0915  Mobility Specialist Stop Time (ACUTE ONLY) 0930  Mobility Specialist Time Calculation (min) (ACUTE ONLY) 15 min   During Mobility: 138 HR , 83-93% SpO2 2 L Post Mobility: 113 HR , 95% SpO2 2 L  Pt received in chair, agreeable to mobility. C/o SOB prior to ambulation. Sats in low 80's on 2 L but with pursed lip breathing quickly elevated to 93%. Pt was only able to stand and take two steps before seated rest break d/t dizziness. HR peaked at 138 bpm. Pt declining further ambulation after break. Pt left in chair asymptomatic with call bell in reach and all needs met.   Megan Manning  Mobility Specialist Please contact via [employer] office at [phone]

## 2023-10-13 NOTE — Plan of Care (Signed)
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
## 2023-10-13 NOTE — Discharge Summary (Signed)
 Family Medicine Teaching [facility] Discharge Summary  Patient name: Megan Manning Medical record number: [id number] Date of birth: 31-Mar-1981 Age: 43 y.o. Gender: female Date of Admission: 10/02/2023  Date of Discharge: 10/13/2022 Admitting Physician: Glendale Chard, DO  Primary Care Provider: Angelica Pou, NP Consultants: Psychiatry, Pulmonology  Indication for Hospitalization: Acute Hypoxic Respiratory Failure  Discharge Diagnoses/Problem List:  Principal Problem for Admission: AHRF 2/2 asthma exacerbation, OHS Other Problems addressed during stay: Principal Problem:   SOB  Asthma  OHS Active Problems:   Self-injurious behavior   Chest pain   Hypertension   Chronic health problem  Brief Hospital Course:  Megan Manning is a 43 y.o. female with PMH of developmental delay, bipolar disorder, OHS, asthma, T2DM, GERD, and hypothyroidism who was admitted to the [facility] Medicine Teaching Service with AHRF secondary asthma exacerbation and phrenic nerve palsy.  Her hospital course is outlined below.  Shortness of breath The patient became short of breath 3 days prior to call for admission.  She then experienced episode of hypoxia on room air, requiring 4L nasal cannula, saturating in low 90s on RA. Extensive workup done in the ED, including CT PE, which was undeterminable due to body habitus.  D-dimer was WNL and vascular lower extremity Dopplers were negative for DVT.  Echocardiogram was stable from prior with mild HFrEF (EF of 50 to 55%).  Respiratory 20 pathogen panel and COVID were negative.  ABG notable for increased pCO2 and a normal pH.  Troponins were negative and EKG was consistent with priors.  On admission, she had diffuse expiratory wheezing.  She was started on DuoNebs every 4 hours and given a low-dose of Solu-Medrol (completed 5 day course) due to her congruent psychiatric issues.  She was later switched to Xopenex due to tachycardia.  She was started on BiPAP due to  increased respiratory rate.  While on BiPAP, her respiratory status improved.  She remained on BiPAP until 3/8, as she appeared to be trying to resist the breaths from the BIPAP.  Afterwards, patient remained intermittently hypoxic requiring oxygen supplementation.  Pulmonology was consulted and recommended starting Anoro Ellipta daily as well as Z-Pak for possible bronchitis (completed 5 day course).  Suspect respiratory symptoms were due to combination of obesity hypoventilation, underlying asthma, and deconditioning in ED.  Her respiratory status improved with nebulizers and by time of discharge, patient was requiring 2L O2 by nasal canula intermittently and was discharged on this supplemental oxygen.  Impulsive Behaviors Patient was initially brought to the ED in December 2024 for for behavioral health concerns, and chart review shows she has previously swallowed foreign body such as her hospital band, Band-Aids, [employer] fry box, etc.  She was boarding in the ED for approximately 75 days awaiting placement at group home facility.  Iron panel obtained for pica and did not show iron deficiency.  Psychiatry was consulted, who determined need for 1:1 sitter, but no indication for psychiatric hospitalization, as behaviors were purely impulses related to her developmental disorder rather than underlying psychiatric condition.  Also recommended Seroquel 200 mg at bedtime, increasing sertraline to 100 mg daily, lamotrigine 25 mg AM and 50 mg PM, and discontinuing Depakote.  No self-injurious or impulsive behaviors were observed while inpatient.  Psychiatry was reconsulted during hospitalization and determined no further intervention.  1:1 sitter was discontinued on 3/17 PM, >24 hours prior to discharge, and patient did well with no behavioral issues.  Was not able to get SNF placement and will discharge to  group home with DME manual wheelchair, walker, and 3 in 1.  Shared decision-making with legal guardian was  undertaken prior to discharge concerning dispo planning and all were in agreement.  Hypertension Intermittent elevations of blood pressure suspected to be in setting of anxiety, as they are often accompanied by tachycardia.  Overall, blood pressures remained normal to mildly hypertensive this admission.  Vaginal itching Developed vaginal itching and burning with urination on 3/10.  Urinalysis unremarkable.  Patient stated symptoms were consistent with prior yeast infections.  Unable to perform full pelvic exam in hospital bed.  Patient was treated empirically with Diflucan 100mg  every other day x3 doses with resolution of symptoms.  Obesity  Weight loss Was able to get approved for Wegovy starter dose 0.25 mg for 1 month to assist with weight loss, which was prescribed at discharge.  Will need Prior Auth for further doses.  Other chronic conditions were medically managed with home medications and formulary alternatives as necessary (T2DM, OSA, GERD, hypothyroidism).  PCP Follow Up: Legal Guardian [name] Massenburg [phone]). Please refer for outpatient physical therapy, did not qualify for SNF and no Home Health benefit with insurance. Will need Prior Auth for refills or increasing dosage of Wegovy, which was started at discharge after PA approval for 1 month. Please follow up respiratory status, deconditioning. Discontinued clonidine due to concern for off target effects, may need antihypertensive meds adjustment.  Disposition: Group Home with DME manual wheelchair, walker, and 3 in 1 Discussed dispo to group home versus SNF with legal guardian Morey Hummingbird at [facility] prior to discharge and through shared decision making guardian is in agreement that group home is the best option for patient.  Discharge Condition: Stable, on 2L [place]  Discharge Exam:  Vitals:   10/13/23 0511 10/13/23 0729  BP: 130/80 (!) 157/63  Pulse: (!) 101 99  Resp: 18 16  Temp: 97.7 F  (36.5 C) 98.6 F (37 C)  SpO2: 93% 94%   Physical Exam: General: Resting comfortably in bed, NAD, at baseline. HEENT: MMM. No JVD. Cardiovascular: Tachycardic rate and regular rhythm. Normal S1/S2. No murmurs, rubs, or gallops appreciated. 2+ radial pulses. Pulmonary: Clear bilaterally to ascultation. No wheezes, crackles, or rhonchi. Baseline WOB on 1.5 L [place]. Abdominal: Normoactive bowel sounds, nondistended. No tenderness to deep or light palpation. No rebound or guarding. Skin: Warm and dry.  No rashes grossly. Extremities: 1+ peripheral edema bilaterally. Capillary refill <2 seconds. Psych: Denies SI/HI.  Limited insight, but appropriate responses to questions.  Significant Procedures: None  Significant Labs and Imaging:  No results for input(s): "WBC", "HGB", "HCT", "PLT" in the last 48 hours. No results for input(s): "NA", "K", "CL", "CO2", "GLUCOSE", "BUN", "CREATININE", "CALCIUM", "MG", "PHOS", "ALKPHOS", "AST", "ALT", "ALBUMIN", "PROTEIN" in the last 48 hours.  Echocardiogram: EF 55% no wall motion abnormalities, normal valves.  Lower extremity Dopplers: negative for DVT CTA PE: Due to inadequate opacification of the pulmonary artery despite multiple times, as well as artifact secondary to body habitus, pulmonary emboli could be excluded on the basis of this exam.  Stable elevated right hemidiaphragm is noted with mild right basilar atelectasis.  Results/Tests Pending at Time of Discharge: None  Discharge Medications:  Allergies as of 10/13/2023       Reactions   Chlorpromazine Rash, Other (See Comments)   Caused cornea problems Not listed on the MAR   Lactose Intolerance (gi)    Lactose intolerant        Medication List  STOP taking these medications    amoxicillin 500 MG tablet Commonly known as: AMOXIL   celecoxib 200 MG capsule Commonly known as: CELEBREX   cloNIDine 0.1 MG tablet Commonly known as: CATAPRES   cyclobenzaprine 10 MG tablet Commonly  known as: FLEXERIL   diazepam 5 MG tablet Commonly known as: VALIUM   docusate sodium 100 MG capsule Commonly known as: COLACE   fluPHENAZine 2.5 MG tablet Commonly known as: PROLIXIN   furosemide 40 MG tablet Commonly known as: LASIX   guaiFENesin 100 MG/5ML liquid Commonly known as: ROBITUSSIN   haloperidol 5 MG tablet Commonly known as: HALDOL       TAKE these medications    acetaminophen 500 MG tablet Commonly known as: TYLENOL Take 1,000 mg by mouth every 8 (eight) hours as needed for mild pain (pain score 1-3) or fever.   albuterol 108 (90 Base) MCG/ACT inhaler Commonly known as: VENTOLIN HFA Inhale 2 puffs into the lungs every 6 (six) hours as needed for wheezing (or coughing).   alum & mag hydroxide-simeth 400-400-40 MG/5ML suspension Commonly known as: MAALOX PLUS Take 30 mLs by mouth every 8 (eight) hours as needed (heartburn and upset stomach).   Antacid Ultra Strength 1000-200 MG Chew Generic drug: Ca Carbonate-Mag Hydroxide Chew 0.5 tablets by mouth daily.   cetirizine 10 MG tablet Commonly known as: ZYRTEC Take 10 mg by mouth daily.   dicyclomine 10 MG capsule Commonly known as: BENTYL Take 10 mg by mouth 3 (three) times daily as needed (abdominal pain).   doxazosin 2 MG tablet Commonly known as: CARDURA Take 2 mg by mouth daily.   ferrous sulfate 325 (65 FE) MG tablet Take 1 tablet (325 mg total) by mouth daily with breakfast. Start taking on: October 14, 2023   hydrocortisone cream 1 % Apply 1 Application topically 2 (two) times daily as needed for itching (infection).   ibuprofen 400 MG tablet Commonly known as: ADVIL Take 1 tablet (400 mg total) by mouth every 6 (six) hours as needed (Pain).   lamoTRIgine 25 MG tablet Commonly known as: LAMICTAL Take 1 tab in the morning and 2 tabs at night   levothyroxine 88 MCG tablet Commonly known as: SYNTHROID Take 1 tablet (88 mcg total) by mouth daily. What changed: when to take this    medroxyPROGESTERone 150 MG/ML injection Commonly known as: DEPO-PROVERA Inject 150 mg into the muscle every 3 (three) months.   metoprolol succinate 25 MG 24 hr tablet Commonly known as: TOPROL-XL Take 1 tablet (25 mg total) by mouth daily. Start taking on: October 14, 2023 What changed:  medication strength how much to take additional instructions   pantoprazole 40 MG tablet Commonly known as: PROTONIX Take 40 mg by mouth 2 (two) times daily.   polyethylene glycol powder 17 GM/SCOOP powder Commonly known as: GLYCOLAX/MIRALAX Take 1 capful (17 g) by mouth 2 (two) times daily.   QUEtiapine 200 MG tablet Commonly known as: SEROQUEL Take 200 mg by mouth at bedtime.   senna 8.6 MG Tabs tablet Commonly known as: SENOKOT Take 1 tablet (8.6 mg total) by mouth daily. Start taking on: October 14, 2023   sertraline 100 MG tablet Commonly known as: ZOLOFT Take 1 tablet (100 mg total) by mouth at bedtime. What changed:  medication strength how much to take when to take this   umeclidinium-vilanterol 62.5-25 MCG/ACT Aepb Commonly known as: ANORO ELLIPTA Inhale 1 puff into the lungs daily. Start taking on: October 14, 2023   Voltaren 1 %  Gel Generic drug: diclofenac Sodium Apply 4 g topically 3 (three) times daily as needed (for arthritis pain).   Wegovy 0.25 MG/0.5ML Soaj Generic drug: Semaglutide-Weight Management Inject 0.25 mg into the skin once a week.               Durable Medical Equipment  (From admission, onward)           Start     Ordered   10/13/23 1112  For home use only DME oxygen  Once       Question Answer Comment  Length of Need Lifetime   Mode or (Route) Nasal cannula   Liters per Minute 2   Frequency Continuous (stationary and portable oxygen unit needed)   Oxygen conserving device Yes   Oxygen delivery system Gas      10/13/23 1112   10/13/23 1038  For home use only DME standard manual wheelchair with seat cushion  Once       Comments:  Patient suffers from weakness which impairs their ability to perform daily activities like ambulating  in the home.  A cane  will not resolve issue with performing activities of daily living. A wheelchair will allow patient to safely perform daily activities. Patient can safely propel the wheelchair in the home or has a caregiver who can provide assistance. Length of need lifetime . Accessories: elevating leg rests (ELRs), wheel locks, extensions and anti-tippers.  Seat and back cushions   10/13/23 1037   10/13/23 1037  For home use only DME Walker rolling  Once       Question Answer Comment  Walker: With 5 Inch Wheels   Patient needs a walker to treat with the following condition Weakness      10/13/23 1037   10/13/23 1037  For home use only DME 3 n 1  Once        10/13/23 1037           Discharge Instructions: Patient was counseled important signs and symptoms that should prompt return to medical care, changes in medications, dietary instructions, activity restrictions, and follow up appointments.   Follow-Up Appointments: PCP follow up per Group Home, recommend within 1 week  Sharion Dove, Madisyn Mawhinney, MD 10/13/2023, 11:53 AM PGY-1, [facility]

## 2023-10-13 NOTE — TOC CM/SW Note (Signed)
    Durable Medical Equipment  (From admission, onward)           Start     Ordered   10/13/23 1038  For home use only DME standard manual wheelchair with seat cushion  Once       Comments: Patient suffers from weakness which impairs their ability to perform daily activities like ambulating  in the home.  A cane  will not resolve issue with performing activities of daily living. A wheelchair will allow patient to safely perform daily activities. Patient can safely propel the wheelchair in the home or has a caregiver who can provide assistance. Length of need lifetime . Accessories: elevating leg rests (ELRs), wheel locks, extensions and anti-tippers.  Seat and back cushions   10/13/23 1037   10/13/23 1037  For home use only DME Walker rolling  Once       Question Answer Comment  Walker: With 5 Inch Wheels   Patient needs a walker to treat with the following condition Weakness      10/13/23 1037   10/13/23 1037  For home use only DME 3 n 1  Once        10/13/23 1037

## 2023-10-13 NOTE — TOC Progression Note (Addendum)
 Transition of Care [facility]) - Progression Note    Patient Details  Name: Megan Manning MRN: [id number] Date of Birth: 11/15/1980  Transition of [facility]) CM/SW Contact  Erin Sons, [place] Phone Number: 10/13/2023, 11:20 AM  Clinical Narrative:     CSW received email from Anastasia Fiedler with [facility] group home stating that they are able to admit pt today. CSW called Lillia Abed and confirmed. Spoke with [employer] as well and confirmed he can pick pt up around 1pm. He will bring O2 tank for transport. RNCM working on arranging O2 at group home address [address] [place].   CSW called pt's guardianship agency, [facility], and confirmed they are aware of pt's DC. CSW to provide DC Summary once available.   1205: DC summary and fl2 sent to [facility], pt's guardian agency, and [facility] [facility].   Expected Discharge Plan: Rest Home Barriers to Discharge: No Barriers Identified                          Social Determinants of Health (SDOH) Interventions SDOH Screenings   Food Insecurity: No Food Insecurity (10/04/2023)  Housing: Low Risk  (10/04/2023)  Transportation Needs: No Transportation Needs (10/04/2023)  Utilities: Not At Risk (10/04/2023)  Financial Resource Strain: Low Risk  (09/02/2022)   Received from [facility]  Social Connections: Unknown (12/09/2021)   Received from [facility]  Stress: No Stress Concern Present (04/15/2023)   Received from [facility]  Recent Concern: Stress - Stress Concern Present (04/05/2023)   Received from [facility]  Tobacco Use: Low Risk  (07/19/2023)    Readmission Risk Interventions     No data to display
